# Patient Record
Sex: Female | Born: 1945 | Race: Black or African American | Hispanic: No | Smoking: Current some day smoker
Health system: Southern US, Community
[De-identification: ages and names within clinical notes are randomized; demographics above are authoritative.]

## PROBLEM LIST (undated history)

## (undated) DIAGNOSIS — F32A Depression, unspecified: Secondary | ICD-10-CM

## (undated) DIAGNOSIS — J449 Chronic obstructive pulmonary disease, unspecified: Secondary | ICD-10-CM

## (undated) DIAGNOSIS — J45909 Unspecified asthma, uncomplicated: Secondary | ICD-10-CM

## (undated) DIAGNOSIS — F329 Major depressive disorder, single episode, unspecified: Secondary | ICD-10-CM

## (undated) DIAGNOSIS — I10 Essential (primary) hypertension: Secondary | ICD-10-CM

## (undated) DIAGNOSIS — I509 Heart failure, unspecified: Secondary | ICD-10-CM

## (undated) DIAGNOSIS — F419 Anxiety disorder, unspecified: Secondary | ICD-10-CM

## (undated) DIAGNOSIS — K219 Gastro-esophageal reflux disease without esophagitis: Secondary | ICD-10-CM

## (undated) HISTORY — PX: TUBAL LIGATION: SHX77

## (undated) HISTORY — DX: Heart failure, unspecified: I50.9

## (undated) HISTORY — PX: ABDOMINAL HYSTERECTOMY: SHX81

## (undated) HISTORY — PX: TOTAL HIP ARTHROPLASTY: SHX124

---

## 2016-03-24 ENCOUNTER — Encounter: Payer: Self-pay | Admitting: Emergency Medicine

## 2016-03-24 ENCOUNTER — Observation Stay
Admission: EM | Admit: 2016-03-24 | Discharge: 2016-03-26 | Disposition: A | Discharge status: Home / Self Care | Payer: Medicare Other | Attending: Internal Medicine | Admitting: Internal Medicine

## 2016-03-24 DIAGNOSIS — Z9109 Other allergy status, other than to drugs and biological substances: Secondary | ICD-10-CM | POA: Insufficient documentation

## 2016-03-24 DIAGNOSIS — Z90710 Acquired absence of both cervix and uterus: Secondary | ICD-10-CM | POA: Diagnosis not present

## 2016-03-24 DIAGNOSIS — E213 Hyperparathyroidism, unspecified: Secondary | ICD-10-CM | POA: Insufficient documentation

## 2016-03-24 DIAGNOSIS — J449 Chronic obstructive pulmonary disease, unspecified: Secondary | ICD-10-CM | POA: Diagnosis not present

## 2016-03-24 DIAGNOSIS — R748 Abnormal levels of other serum enzymes: Secondary | ICD-10-CM | POA: Diagnosis present

## 2016-03-24 DIAGNOSIS — Z885 Allergy status to narcotic agent status: Secondary | ICD-10-CM | POA: Insufficient documentation

## 2016-03-24 DIAGNOSIS — E041 Nontoxic single thyroid nodule: Secondary | ICD-10-CM | POA: Diagnosis not present

## 2016-03-24 DIAGNOSIS — Z96642 Presence of left artificial hip joint: Secondary | ICD-10-CM | POA: Diagnosis not present

## 2016-03-24 DIAGNOSIS — Z886 Allergy status to analgesic agent status: Secondary | ICD-10-CM | POA: Diagnosis not present

## 2016-03-24 DIAGNOSIS — Z8249 Family history of ischemic heart disease and other diseases of the circulatory system: Secondary | ICD-10-CM | POA: Insufficient documentation

## 2016-03-24 DIAGNOSIS — J209 Acute bronchitis, unspecified: Secondary | ICD-10-CM | POA: Insufficient documentation

## 2016-03-24 DIAGNOSIS — I214 Non-ST elevation (NSTEMI) myocardial infarction: Secondary | ICD-10-CM | POA: Diagnosis present

## 2016-03-24 DIAGNOSIS — R6884 Jaw pain: Secondary | ICD-10-CM | POA: Insufficient documentation

## 2016-03-24 DIAGNOSIS — I10 Essential (primary) hypertension: Secondary | ICD-10-CM | POA: Insufficient documentation

## 2016-03-24 DIAGNOSIS — Z7982 Long term (current) use of aspirin: Secondary | ICD-10-CM | POA: Diagnosis not present

## 2016-03-24 DIAGNOSIS — F419 Anxiety disorder, unspecified: Secondary | ICD-10-CM | POA: Insufficient documentation

## 2016-03-24 DIAGNOSIS — E279 Disorder of adrenal gland, unspecified: Secondary | ICD-10-CM | POA: Insufficient documentation

## 2016-03-24 DIAGNOSIS — R1310 Dysphagia, unspecified: Secondary | ICD-10-CM | POA: Diagnosis not present

## 2016-03-24 DIAGNOSIS — I700 Atherosclerosis of aorta: Secondary | ICD-10-CM | POA: Diagnosis not present

## 2016-03-24 DIAGNOSIS — R079 Chest pain, unspecified: Secondary | ICD-10-CM | POA: Diagnosis not present

## 2016-03-24 DIAGNOSIS — R778 Other specified abnormalities of plasma proteins: Secondary | ICD-10-CM

## 2016-03-24 DIAGNOSIS — Z79899 Other long term (current) drug therapy: Secondary | ICD-10-CM | POA: Insufficient documentation

## 2016-03-24 DIAGNOSIS — K219 Gastro-esophageal reflux disease without esophagitis: Secondary | ICD-10-CM | POA: Diagnosis not present

## 2016-03-24 DIAGNOSIS — F329 Major depressive disorder, single episode, unspecified: Secondary | ICD-10-CM | POA: Insufficient documentation

## 2016-03-24 DIAGNOSIS — F17200 Nicotine dependence, unspecified, uncomplicated: Secondary | ICD-10-CM | POA: Diagnosis not present

## 2016-03-24 DIAGNOSIS — R7989 Other specified abnormal findings of blood chemistry: Secondary | ICD-10-CM

## 2016-03-24 HISTORY — DX: Depression, unspecified: F32.A

## 2016-03-24 HISTORY — DX: Essential (primary) hypertension: I10

## 2016-03-24 HISTORY — DX: Gastro-esophageal reflux disease without esophagitis: K21.9

## 2016-03-24 HISTORY — DX: Major depressive disorder, single episode, unspecified: F32.9

## 2016-03-24 HISTORY — DX: Unspecified asthma, uncomplicated: J45.909

## 2016-03-24 HISTORY — DX: Chronic obstructive pulmonary disease, unspecified: J44.9

## 2016-03-24 HISTORY — DX: Anxiety disorder, unspecified: F41.9

## 2016-03-24 LAB — COMPREHENSIVE METABOLIC PANEL
ALT: 15 U/L (ref 14–54)
AST: 19 U/L (ref 15–41)
Albumin: 4.3 g/dL (ref 3.5–5.0)
Alkaline Phosphatase: 72 U/L (ref 38–126)
Anion gap: 7 (ref 5–15)
BUN: 15 mg/dL (ref 6–20)
CO2: 24 mmol/L (ref 22–32)
Calcium: 9.6 mg/dL (ref 8.9–10.3)
Chloride: 109 mmol/L (ref 101–111)
Creatinine, Ser: 0.84 mg/dL (ref 0.44–1.00)
GFR calc Af Amer: >60 mL/min (ref >60)
GFR calc non Af Amer: >60 mL/min (ref >60)
Glucose, Bld: 91 mg/dL (ref 65–99)
Potassium: 4.3 mmol/L (ref 3.5–5.1)
Sodium: 140 mmol/L (ref 135–145)
Total Bilirubin: 0.8 mg/dL (ref 0.3–1.2)
Total Protein: 7.6 g/dL (ref 6.5–8.1)

## 2016-03-24 LAB — CBC
HCT: 37.4 % (ref 35.0–47.0)
Hemoglobin: 12.7 g/dL (ref 12.0–16.0)
MCH: 31.7 pg (ref 26.0–34.0)
MCHC: 34.1 g/dL (ref 32.0–36.0)
MCV: 93 fL (ref 80.0–100.0)
Platelets: 539 10*3/uL — ABNORMAL HIGH (ref 150–440)
RBC: 4.02 MIL/uL (ref 3.80–5.20)
RDW: 15.3 % — ABNORMAL HIGH (ref 11.5–14.5)
WBC: 8.6 10*3/uL (ref 3.6–11.0)

## 2016-03-24 LAB — TROPONIN I: Troponin I: 0.06 ng/mL (ref <0.03)

## 2016-03-24 NOTE — ED Notes (Signed)
Charge nurse notified troponin 0.06; pt taken to room 10 by Almyra Free EDT and placed on card monitor for further eval

## 2016-03-24 NOTE — ED Triage Notes (Signed)
Patient reports pain to right side jaw and lymph node area of neck. Patient states she has been being followed by [facility] for something related to swelling and pain in same area for 2 years. States pain has become so severe in last couple days that she can't tolerate pain anymore. Patient unable to state what condition doctors think she may have, etc. States pain feels like a toothache, has dentures on upper and lower.

## 2016-03-25 ENCOUNTER — Encounter: Payer: Self-pay | Admitting: Radiology

## 2016-03-25 ENCOUNTER — Emergency Department: Payer: Medicare Other

## 2016-03-25 ENCOUNTER — Observation Stay
Admit: 2016-03-25 | Discharge: 2016-03-25 | Disposition: A | Discharge status: Home / Self Care | Payer: Medicare Other | Attending: Internal Medicine | Admitting: Internal Medicine

## 2016-03-25 DIAGNOSIS — R079 Chest pain, unspecified: Secondary | ICD-10-CM | POA: Diagnosis present

## 2016-03-25 LAB — LIPID PANEL
Cholesterol: 139 mg/dL (ref 0–200)
HDL: 46 mg/dL (ref >40)
LDL Cholesterol: 76 mg/dL (ref 0–99)
Total CHOL/HDL Ratio: 3 RATIO
Triglycerides: 87 mg/dL (ref <150)
VLDL: 17 mg/dL (ref 0–40)

## 2016-03-25 LAB — CBC
HCT: 39.3 % (ref 35.0–47.0)
Hemoglobin: 13.3 g/dL (ref 12.0–16.0)
MCH: 31.4 pg (ref 26.0–34.0)
MCHC: 33.8 g/dL (ref 32.0–36.0)
MCV: 92.9 fL (ref 80.0–100.0)
Platelets: 530 10*3/uL — ABNORMAL HIGH (ref 150–440)
RBC: 4.23 MIL/uL (ref 3.80–5.20)
RDW: 15.5 % — ABNORMAL HIGH (ref 11.5–14.5)
WBC: 7.4 10*3/uL (ref 3.6–11.0)

## 2016-03-25 LAB — BASIC METABOLIC PANEL
Anion gap: 9 (ref 5–15)
BUN: 15 mg/dL (ref 6–20)
CO2: 21 mmol/L — ABNORMAL LOW (ref 22–32)
Calcium: 9.2 mg/dL (ref 8.9–10.3)
Chloride: 110 mmol/L (ref 101–111)
Creatinine, Ser: 0.87 mg/dL (ref 0.44–1.00)
GFR calc Af Amer: >60 mL/min (ref >60)
GFR calc non Af Amer: >60 mL/min (ref >60)
Glucose, Bld: 82 mg/dL (ref 65–99)
Potassium: 4.3 mmol/L (ref 3.5–5.1)
Sodium: 140 mmol/L (ref 135–145)

## 2016-03-25 LAB — APTT: aPTT: 34 seconds (ref 24–36)

## 2016-03-25 LAB — TROPONIN I
Troponin I: 0.05 ng/mL (ref <0.03)
Troponin I: 0.04 ng/mL (ref <0.03)

## 2016-03-25 LAB — PROTIME-INR
INR: 0.95
Prothrombin Time: 12.7 seconds (ref 11.4–15.2)

## 2016-03-25 MED ORDER — ATORVASTATIN CALCIUM 20 MG PO TABS
20.0000 mg | ORAL_TABLET | Freq: Every day | ORAL | Status: DC
  Administered 2016-03-25: 20 mg via ORAL
  Filled 2016-03-25 (×2): qty 1

## 2016-03-25 MED ORDER — ESCITALOPRAM OXALATE 10 MG PO TABS
20.0000 mg | ORAL_TABLET | Freq: Every day | ORAL | Status: DC
  Administered 2016-03-25 – 2016-03-26 (×2): 20 mg via ORAL
  Filled 2016-03-25: qty 1
  Filled 2016-03-25 (×2): qty 2

## 2016-03-25 MED ORDER — LISINOPRIL 20 MG PO TABS
40.0000 mg | ORAL_TABLET | Freq: Every day | ORAL | Status: DC
  Administered 2016-03-25 – 2016-03-26 (×2): 40 mg via ORAL
  Filled 2016-03-25 (×2): qty 2

## 2016-03-25 MED ORDER — HEPARIN SODIUM (PORCINE) 5000 UNIT/ML IJ SOLN
4000.0000 [IU] | Freq: Once | INTRAMUSCULAR | Status: AC
  Administered 2016-03-25: 4000 [IU] via INTRAVENOUS
  Filled 2016-03-25: qty 1

## 2016-03-25 MED ORDER — HEPARIN (PORCINE) IN NACL 100-0.45 UNIT/ML-% IJ SOLN
800.0000 [IU]/h | INTRAMUSCULAR | Status: DC
  Administered 2016-03-25: 800 [IU]/h via INTRAVENOUS
  Filled 2016-03-25: qty 250

## 2016-03-25 MED ORDER — ACETAMINOPHEN 325 MG PO TABS
650.0000 mg | ORAL_TABLET | ORAL | Status: DC | PRN
  Administered 2016-03-25: 650 mg via ORAL
  Filled 2016-03-25: qty 2

## 2016-03-25 MED ORDER — SODIUM CHLORIDE 0.9 % IV SOLN: 250.0000 mL | INTRAVENOUS | Status: DC | PRN

## 2016-03-25 MED ORDER — ASPIRIN EC 81 MG PO TBEC
81.0000 mg | DELAYED_RELEASE_TABLET | Freq: Every day | ORAL | Status: DC
  Administered 2016-03-26: 81 mg via ORAL
  Filled 2016-03-25: qty 1

## 2016-03-25 MED ORDER — ASPIRIN 81 MG PO CHEW
324.0000 mg | CHEWABLE_TABLET | Freq: Once | ORAL | Status: AC
  Administered 2016-03-25: 324 mg via ORAL
  Filled 2016-03-25: qty 4

## 2016-03-25 MED ORDER — CARVEDILOL 6.25 MG PO TABS
3.1250 mg | ORAL_TABLET | Freq: Two times a day (BID) | ORAL | Status: DC
  Administered 2016-03-25 – 2016-03-26 (×3): 3.125 mg via ORAL
  Filled 2016-03-25 (×2): qty 1
  Filled 2016-03-25: qty 2

## 2016-03-25 MED ORDER — AMLODIPINE BESYLATE 10 MG PO TABS
10.0000 mg | ORAL_TABLET | Freq: Every day | ORAL | Status: DC
  Administered 2016-03-25 – 2016-03-26 (×2): 10 mg via ORAL
  Filled 2016-03-25 (×2): qty 1

## 2016-03-25 MED ORDER — IPRATROPIUM BROMIDE HFA 17 MCG/ACT IN AERS: 2.0000 | INHALATION_SPRAY | Freq: Four times a day (QID) | RESPIRATORY_TRACT | Status: DC

## 2016-03-25 MED ORDER — NITROGLYCERIN 0.4 MG SL SUBL: 0.4000 mg | SUBLINGUAL_TABLET | SUBLINGUAL | Status: DC | PRN

## 2016-03-25 MED ORDER — OXYCODONE-ACETAMINOPHEN 5-325 MG PO TABS
2.0000 | ORAL_TABLET | Freq: Once | ORAL | Status: AC
  Administered 2016-03-25: 2 via ORAL
  Filled 2016-03-25: qty 2

## 2016-03-25 MED ORDER — FAMOTIDINE 20 MG PO TABS
20.0000 mg | ORAL_TABLET | Freq: Two times a day (BID) | ORAL | Status: DC
  Administered 2016-03-25 – 2016-03-26 (×2): 20 mg via ORAL
  Filled 2016-03-25 (×2): qty 1

## 2016-03-25 MED ORDER — ONDANSETRON HCL 4 MG/2ML IJ SOLN: 4.0000 mg | Freq: Four times a day (QID) | INTRAMUSCULAR | Status: DC | PRN

## 2016-03-25 MED ORDER — SODIUM CHLORIDE 0.9% FLUSH
3.0000 mL | Freq: Two times a day (BID) | INTRAVENOUS | Status: DC
  Administered 2016-03-25 – 2016-03-26 (×3): 3 mL via INTRAVENOUS

## 2016-03-25 MED ORDER — MOMETASONE FURO-FORMOTEROL FUM 200-5 MCG/ACT IN AERO
2.0000 | INHALATION_SPRAY | Freq: Two times a day (BID) | RESPIRATORY_TRACT | Status: DC
  Administered 2016-03-25 – 2016-03-26 (×2): 2 via RESPIRATORY_TRACT
  Filled 2016-03-25: qty 8.8

## 2016-03-25 MED ORDER — CLOPIDOGREL BISULFATE 75 MG PO TABS
75.0000 mg | ORAL_TABLET | Freq: Every day | ORAL | Status: DC
  Administered 2016-03-25: 75 mg via ORAL
  Filled 2016-03-25: qty 1

## 2016-03-25 MED ORDER — IPRATROPIUM BROMIDE 0.02 % IN SOLN
0.5000 mg | Freq: Four times a day (QID) | RESPIRATORY_TRACT | Status: DC
  Administered 2016-03-25: 0.5 mg via RESPIRATORY_TRACT
  Filled 2016-03-25: qty 2.5

## 2016-03-25 MED ORDER — PRAVASTATIN SODIUM 10 MG PO TABS: 10.0000 mg | ORAL_TABLET | Freq: Every day | ORAL | Status: DC

## 2016-03-25 MED ORDER — SODIUM CHLORIDE 0.9% FLUSH: 3.0000 mL | INTRAVENOUS | Status: DC | PRN

## 2016-03-25 MED ORDER — IOPAMIDOL (ISOVUE-300) INJECTION 61%
75.0000 mL | Freq: Once | INTRAVENOUS | Status: AC | PRN
  Administered 2016-03-25: 75 mL via INTRAVENOUS

## 2016-03-25 MED ORDER — IPRATROPIUM BROMIDE 0.02 % IN SOLN
0.5000 mg | Freq: Four times a day (QID) | RESPIRATORY_TRACT | Status: DC
  Administered 2016-03-26 (×2): 0.5 mg via RESPIRATORY_TRACT
  Filled 2016-03-25 (×2): qty 2.5

## 2016-03-25 NOTE — ED Notes (Signed)
Per Dr Darvin Neighbours, pt will stay the night and have stress test in AM.

## 2016-03-25 NOTE — Progress Notes (Signed)
ANTICOAGULATION CONSULT NOTE - Initial Consult  Pharmacy Consult for heparin drip Indication: chest pain/ACS/NSTEMI  Allergies  Allergen Reactions  . Aspirin Other (See Comments)    GI ulcers  . Morphine And Related Other (See Comments)    Delerium, itching  . Nsaids Other (See Comments)    GI ulcers    Patient Measurements: Height: 5\' 3"  (160 cm) Weight: 155 lb (70.3 kg) IBW/kg (Calculated) : 52.4 Heparin Dosing Weight: 67kg  Vital Signs: Temp: 98.3 F (36.8 C) (01/09 1934) Temp Source: Oral (01/09 1934) BP: 144/80 (01/10 0433) Pulse Rate: 66 (01/10 0433)  Labs:  Recent Labs  03/24/16 1931 03/25/16 0037  HGB 12.7  --   HCT 37.4  --   PLT 539*  --   APTT  --  34  LABPROT  --  12.7  INR  --  0.95  CREATININE 0.84  --   TROPONINI 0.06*  --     Estimated Creatinine Clearance: 57.8 mL/min (by C-G formula based on SCr of 0.84 mg/dL).   Medical History: Past Medical History:  Diagnosis Date  . Anxiety   . Asthma   . COPD (chronic obstructive pulmonary disease) ([place])   . Depression   . GERD (gastroesophageal reflux disease)   . Hypertension     Medications:  No anticoagulation in PTA meds  Assessment:  Goal of Therapy:  Heparin level 0.3-0.7 units/ml Monitor platelets by anticoagulation protocol: Yes   Plan:  4000 unit bolus and initial rate of 800 units/hr. First heparin level 8 hours after start of infusion.  Yida Hyams S 03/25/2016,4:40 AM

## 2016-03-25 NOTE — H&P (Signed)
[facility] [place] NAME: Pamela James    MR#:  [id number]  DATE OF BIRTH:  19-Dec-1945  DATE OF ADMISSION:  03/24/2016  PRIMARY CARE PHYSICIAN: Cindra Eves, MD   REQUESTING/REFERRING PHYSICIAN:   CHIEF COMPLAINT:   Chief Complaint  Patient presents with  . Jaw Pain    HISTORY OF PRESENT ILLNESS: Pamela James  is a 71 y.o. female with a known history of Anxiety disorder, COPD, GERD, hypertension presented to the emergency room with chest pain and right-sided jaw pain for the last few days. The pain is located in the left side of the chest and is aching and pressure like sensation 4 out of 10 on a scale of 1-10. Patient also has some pain in the right jaw. Patient was evaluated with CT of the neck which showed thyroid nodule. Her first set of troponin was borderline on 0.06. No history of any shortness of breath, orthopnea or proximal nocturnal dyspnea. No headache dizziness or blurry vision. EKG normal sinus rhythm with no ST segment changes. Hospitalist service was consulted for further care of the patient.  PAST MEDICAL HISTORY:   Past Medical History:  Diagnosis Date  . Anxiety   . Asthma   . COPD (chronic obstructive pulmonary disease) ([place])   . Depression   . GERD (gastroesophageal reflux disease)   . Hypertension     PAST SURGICAL HISTORY: Past Surgical History:  Procedure Laterality Date  . ABDOMINAL HYSTERECTOMY    . TOTAL HIP ARTHROPLASTY Left   . TUBAL LIGATION      SOCIAL HISTORY:  Social History  Substance Use Topics  . Smoking status: Current Every Day Smoker  . Smokeless tobacco: Never Used  . Alcohol use No    FAMILY HISTORY:  Family History  Problem Relation Age of Onset  . Heart disease Father   . Heart disease Sister     DRUG ALLERGIES:  Allergies  Allergen Reactions  . Aspirin Other (See Comments)    GI ulcers  . Morphine And Related Other (See Comments)    Delerium, itching  . Nsaids  Other (See Comments)    GI ulcers    REVIEW OF SYSTEMS:   CONSTITUTIONAL: No fever, fatigue or weakness.  EYES: No blurred or double vision.  EARS, NOSE, AND THROAT: No tinnitus or ear pain.  RESPIRATORY: No cough, shortness of breath, wheezing or hemoptysis.  CARDIOVASCULAR: Has chest pain, no orthopnea, edema.  GASTROINTESTINAL: No nausea, vomiting, diarrhea or abdominal pain.  GENITOURINARY: No dysuria, hematuria.  ENDOCRINE: No polyuria, nocturia,  HEMATOLOGY: No anemia, easy bruising or bleeding SKIN: No rash or lesion. MUSCULOSKELETAL: No joint pain or arthritis.   NEUROLOGIC: No tingling, numbness, weakness.  PSYCHIATRY: No anxiety or depression.   MEDICATIONS AT HOME:  Prior to Admission medications   Medication Sig Start Date End Date Taking? Authorizing Provider  acetaminophen (TYLENOL) 500 MG tablet Take 500 mg by mouth every 6 (six) hours as needed.   Yes Historical Provider, MD  albuterol (PROVENTIL HFA;VENTOLIN HFA) 108 (90 Base) MCG/ACT inhaler Inhale 2 puffs into the lungs every 6 (six) hours as needed for wheezing or shortness of breath.   Yes Historical Provider, MD  amLODipine (NORVASC) 10 MG tablet Take 10 mg by mouth daily.   Yes Historical Provider, MD  escitalopram (LEXAPRO) 20 MG tablet Take 20 mg by mouth daily.   Yes Historical Provider, MD  Fluticasone-Salmeterol (ADVAIR) 250-50 MCG/DOSE AEPB Inhale 1 puff into the  lungs 2 (two) times daily.   Yes Historical Provider, MD  ipratropium (ATROVENT HFA) 17 MCG/ACT inhaler Inhale 2 puffs into the lungs every 6 (six) hours.   Yes Historical Provider, MD  lisinopril (PRINIVIL,ZESTRIL) 40 MG tablet Take 40 mg by mouth daily.   Yes Historical Provider, MD  pravastatin (PRAVACHOL) 20 MG tablet Take 10 mg by mouth daily.   Yes Historical Provider, MD  ranitidine (ZANTAC) 150 MG capsule Take 150 mg by mouth 2 (two) times daily.   Yes Historical Provider, MD      PHYSICAL EXAMINATION:   VITAL SIGNS: Blood pressure  (!) 169/59, pulse (!) 56, temperature 98.3 F (36.8 C), temperature source Oral, resp. rate 20, height 5\' 3"  (1.6 m), weight 70.3 kg (155 lb), SpO2 96 %.  GENERAL:  71 y.o.-year-old patient lying in the bed with no acute distress.  EYES: Pupils equal, round, reactive to light and accommodation. No scleral icterus. Extraocular muscles intact.  HEENT: Head atraumatic, normocephalic. Oropharynx and nasopharynx clear.  NECK:  Supple, no jugular venous distention. No thyroid enlargement, no tenderness.  LUNGS: Normal breath sounds bilaterally, no wheezing, rales,rhonchi or crepitation. No use of accessory muscles of respiration.  CARDIOVASCULAR: S1, S2 normal. No murmurs, rubs, or gallops.  ABDOMEN: Soft, nontender, nondistended. Bowel sounds present. No organomegaly or mass.  EXTREMITIES: No pedal edema, cyanosis, or clubbing.  NEUROLOGIC: Cranial nerves II through XII are intact. Muscle strength 5/5 in all extremities. Sensation intact. Gait not checked.  PSYCHIATRIC: The patient is alert and oriented x 3.  SKIN: No obvious rash, lesion, or ulcer.   LABORATORY PANEL:   CBC  Recent Labs Lab 03/24/16 1931  WBC 8.6  HGB 12.7  HCT 37.4  PLT 539*  MCV 93.0  MCH 31.7  MCHC 34.1  RDW 15.3*   ------------------------------------------------------------------------------------------------------------------  Chemistries   Recent Labs Lab 03/24/16 1931  NA 140  K 4.3  CL 109  CO2 24  GLUCOSE 91  BUN 15  CREATININE 0.84  CALCIUM 9.6  AST 19  ALT 15  ALKPHOS 72  BILITOT 0.8   ------------------------------------------------------------------------------------------------------------------ estimated creatinine clearance is 57.8 mL/min (by C-G formula based on SCr of 0.84 mg/dL). ------------------------------------------------------------------------------------------------------------------ No results for input(s): TSH, T4TOTAL, T3FREE, THYROIDAB in the last 72 hours.  Invalid  input(s): FREET3   Coagulation profile  Recent Labs Lab 03/25/16 0037  INR 0.95   ------------------------------------------------------------------------------------------------------------------- No results for input(s): DDIMER in the last 72 hours. -------------------------------------------------------------------------------------------------------------------  Cardiac Enzymes  Recent Labs Lab 03/24/16 1931  TROPONINI 0.06*   ------------------------------------------------------------------------------------------------------------------ Invalid input(s): POCBNP  ---------------------------------------------------------------------------------------------------------------  Urinalysis No results found for: COLORURINE, APPEARANCEUR, LABSPEC, PHURINE, GLUCOSEU, HGBUR, BILIRUBINUR, KETONESUR, PROTEINUR, UROBILINOGEN, NITRITE, LEUKOCYTESUR   RADIOLOGY: Ct Soft Tissue Neck W Contrast  Result Date: 03/25/2016 CLINICAL DATA:  Right-sided jaw pain. EXAM: CT NECK WITH CONTRAST TECHNIQUE: Multidetector CT imaging of the neck was performed using the standard protocol following the bolus administration of intravenous contrast. CONTRAST:  83mL ISOVUE-300 IOPAMIDOL (ISOVUE-300) INJECTION 61% COMPARISON:  None. FINDINGS: Pharynx and larynx: The nasopharynx is clear. The oropharynx and hypopharynx are normal. The epiglottis, supraglottic larynx, glottis and subglottic larynx are normal. No retropharyngeal collection. The parapharyngeal spaces are preserved. The patient is edentulous. The visible oral cavity and base of tongue are normal. 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 Salivary glands: The parotid and submandibular glands are normal. No sialolithiasis or salivary ductal dilatation. Thyroid: Large, heterogeneous nodule within the right thyroid lobe measures 2.0 cm. Lymph nodes: No enlarged or abnormal  density cervical lymph nodes. Vascular: The aortic atherosclerosis. Major  cervical vessels are normal. Limited intracranial: Normal Visualized orbits: Normal Mastoids and visualized paranasal sinuses: Clear Skeleton: There is no bony spinal canal stenosis. No lytic or blastic lesions. Cervical degenerative disc disease is greatest at C5-6 and C6-7. Upper chest: Negative. Other: None. IMPRESSION: 1. No lymphadenopathy or other neck mass. 2. No finding to explain the reported right-sided jaw pain. 3. 2 cm right thyroid nodule. Further characterization with thyroid ultrasound is recommended on a nonemergent outpatient basis, if not already performed. 4. Aortic atherosclerosis. Electronically Signed   By: Ulyses Jarred M.D.   On: 03/25/2016 01:24    EKG: Orders placed or performed during the hospital encounter of 03/24/16  . ED EKG  . ED EKG  . EKG 12-Lead  . EKG 12-Lead    IMPRESSION AND PLAN: 71 year old female patient with history of anxiety disorder, COPD, GERD and hypertension presented to the emergency room with chest pain and right jaw pain. Admitting diagnosis 1. Chest pain 2. Thyroid nodule 3. COPD 4. GERD 5. Hypertension 6. Borderline troponin Treatment plan Admit patient to telemetry Start patient on IV heparin drip Cycle troponin and check echocardiogram Cardiology consultation When necessary nitrates for chest pain Outpatient workup with ultrasound for thyroid nodule Patient will be started on oral Plavix as patient is allergic to aspirin Supportive care.  All the records are reviewed and case discussed with ED provider. Management plans discussed with the patient, family and they are in agreement.  CODE STATUS:FULL CODE    Code Status Orders        Start     Ordered   03/25/16 0319  Full code  Continuous     03/25/16 0318    Code Status History    Date Active Date Inactive Code Status Order ID Comments User Context   This patient has a current code status but no historical code status.    Advance Directive Documentation    [place] Most Recent Value  Type of Advance Directive  Living will  Pre-existing out of facility DNR order (yellow form or pink MOST form)  No data  "MOST" Form in Place?  No data       TOTAL TIME TAKING CARE OF THIS PATIENT: 51 minutes.    Saundra Shelling M.D on 03/25/2016 at 3:28 AM  Between 7am to 6pm - Pager - [phone]  After 6pm go to [email or web address] - password [email or web address] [facility]  Office  [phone]  CC: Primary care physician; Cindra Eves, MD

## 2016-03-25 NOTE — ED Notes (Signed)
Pt up to bathroom in hallway.

## 2016-03-25 NOTE — ED Provider Notes (Signed)
[facility] Emergency Department Provider Note  ____________________________________________   First MD Initiated Contact with Patient 03/24/16 2359     (approximate)  I have reviewed the triage vital signs and the nursing notes.   HISTORY  Chief Complaint Jaw Pain    HPI Pamela James is a 71 y.o. female with medical history as listed below who presents for evaluation of right-sided jaw pain and right upper neck pain.  She reports that this is better problem for her for years and she is followed by [facility] primary care but it has become severe over the last couple of days.  She states that she is having some difficulty swallowing as well although she does not have any changes in her voice or ability to speak.  She denies fever/chills, nausea, vomiting, abdominal pain.  She does report that she has been having episodic chest pain and some shortness of breath with exertion over the last week which is new compared to prior, and she does have multiple first-degree relatives that have had heart attacks.He is a in every day smoker and also has a history of COPD and hypertension.  She reports that over the couple of years that she has been followed for this pain in the right side of her jaw and neck, she was told that she has some sort of a mass that would be a very delicate operation to remove.  They were just following it but now it is much worse with severe pain and that is why she has come to the emergency department tonight for further evaluation, particularly because it feels to her like she is having difficulty swallowing.  She is completely edentulous so she has no toothache or radiation of pain from inside her mouth.  He describes the pain as severe, sharp, "full", nothing makes it better or worse.   Past Medical History:  Diagnosis Date  . Anxiety   . Asthma   . COPD (chronic obstructive pulmonary disease) ([place])   . Depression   . GERD (gastroesophageal reflux  disease)   . Hypertension     There are no active problems to display for this patient.   Past Surgical History:  Procedure Laterality Date  . ABDOMINAL HYSTERECTOMY    . TOTAL HIP ARTHROPLASTY Left   . TUBAL LIGATION      Prior to Admission medications   Not on File    Allergies Aspirin; Morphine and related; and Nsaids  No family history on file.  Social History Social History  Substance Use Topics  . Smoking status: Current Every Day Smoker  . Smokeless tobacco: Never Used  . Alcohol use No    Review of Systems Constitutional: No fever/chills Eyes: No visual changes. ENT: No sore throat. Mild difficulty swallowing and pain in the right side of her jaw and right upper neck Cardiovascular: Episodic chest pain over the last week, left sided, sharp Respiratory: Episodic Shortness of breath worse with exertion and episodic chest pain  Gastrointestinal: No abdominal pain.  No nausea, no vomiting.  No diarrhea.  No constipation. Genitourinary: Negative for dysuria. Musculoskeletal: Negative for back pain. Skin: Negative for rash. Neurological: Negative for headaches, focal weakness or numbness.  10-point ROS otherwise negative.  ____________________________________________   PHYSICAL EXAM:  VITAL SIGNS: ED Triage Vitals [03/24/16 1934]  Enc Vitals Group     BP (!) 165/76     Pulse Rate 72     Resp 16     Temp 98.3 F (36.8  C)     Temp Source Oral     SpO2 97 %     Weight 155 lb (70.3 kg)     Height 5\' 3"  (1.6 m)     Head Circumference      Peak Flow      Pain Score 8     Pain Loc      Pain Edu?      Excl. in [place]?     Constitutional: Alert and oriented. Well appearing and in no acute distress. Eyes: Conjunctivae are normal. PERRL. EOMI. Head: Atraumatic. Nose: No congestion/rhinnorhea. Mouth/Throat: Mucous membranes are moist.  Oropharynx non-erythematous. Edentulous with no evidence of dental/intraoral abscess Neck: No stridor.  No meningeal  signs.  Some right-sided submandibular lymphadenopathy that is tender to palpation.  No gross deformities. Cardiovascular: Normal rate, regular rhythm. Good peripheral circulation. Grossly normal heart sounds. Respiratory: Normal respiratory effort.  No retractions. Lungs CTAB. Gastrointestinal: Soft and nontender. No distention.  Musculoskeletal: No lower extremity tenderness nor edema. No gross deformities of extremities. Neurologic:  Normal speech and language. No gross focal neurologic deficits are appreciated.  Skin:  Skin is warm, dry and intact. No rash noted. Psychiatric: Mood and affect are normal. Speech and behavior are normal.  ____________________________________________   LABS (all labs ordered are listed, but only abnormal results are displayed)  Labs Reviewed  CBC - Abnormal; Notable for the following:       Result Value   RDW 15.3 (*)    Platelets 539 (*)    All other components within normal limits  TROPONIN I - Abnormal; Notable for the following:    Troponin I 0.06 (*)    All other components within normal limits  COMPREHENSIVE METABOLIC PANEL  PROTIME-INR   ____________________________________________  EKG  ED ECG REPORT I, Taziah Difatta, the attending physician, personally viewed and interpreted this ECG.  Date: 03/24/2016 EKG Time: 19:41 Rate: 68 Rhythm: normal sinus rhythm QRS Axis: normal Intervals: normal ST/T Wave abnormalities: normal Conduction Disturbances: none Narrative Interpretation: unremarkable  ____________________________________________  RADIOLOGY   Ct Soft Tissue Neck W Contrast  Result Date: 03/25/2016 CLINICAL DATA:  Right-sided jaw pain. EXAM: CT NECK WITH CONTRAST TECHNIQUE: Multidetector CT imaging of the neck was performed using the standard protocol following the bolus administration of intravenous contrast. CONTRAST:  58mL ISOVUE-300 IOPAMIDOL (ISOVUE-300) INJECTION 61% COMPARISON:  None. FINDINGS: Pharynx and larynx:  The nasopharynx is clear. The oropharynx and hypopharynx are normal. The epiglottis, supraglottic larynx, glottis and subglottic larynx are normal. No retropharyngeal collection. The parapharyngeal spaces are preserved. The patient is edentulous. The visible oral cavity and base of tongue are normal. 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 Salivary glands: The parotid and submandibular glands are normal. No sialolithiasis or salivary ductal dilatation. Thyroid: Large, heterogeneous nodule within the right thyroid lobe measures 2.0 cm. Lymph nodes: No enlarged or abnormal density cervical lymph nodes. Vascular: The aortic atherosclerosis. Major cervical vessels are normal. Limited intracranial: Normal Visualized orbits: Normal Mastoids and visualized paranasal sinuses: Clear Skeleton: There is no bony spinal canal stenosis. No lytic or blastic lesions. Cervical degenerative disc disease is greatest at C5-6 and C6-7. Upper chest: Negative. Other: None. IMPRESSION: 1. No lymphadenopathy or other neck mass. 2. No finding to explain the reported right-sided jaw pain. 3. 2 cm right thyroid nodule. Further characterization with thyroid ultrasound is recommended on a nonemergent outpatient basis, if not already performed. 4. Aortic atherosclerosis. Electronically Signed   By: Cletus Gash.D.  On: 03/25/2016 01:24    ____________________________________________   PROCEDURES  Procedure(s) performed:   .Critical Care Performed by: Hinda Kehr Authorized by: Hinda Kehr   Critical care provider statement:    Critical care time (minutes):  30   Critical care time was exclusive of:  Separately billable procedures and treating other patients   Critical care was necessary to treat or prevent imminent or life-threatening deterioration of the following conditions:  Cardiac failure   Critical care was time spent personally by me on the following activities:  Development of treatment plan with patient  or surrogate, discussions with consultants, evaluation of patient's response to treatment, examination of patient, obtaining history from patient or surrogate, ordering and performing treatments and interventions, ordering and review of laboratory studies, ordering and review of radiographic studies, pulse oximetry, re-evaluation of patient's condition and review of old charts      Critical Care performed: Yes, see critical care procedure note(s) ____________________________________________   INITIAL IMPRESSION / [facility] / ED COURSE  Pertinent labs & imaging results that were available during my care of the patient were reviewed by me and considered in my medical decision making (see chart for details).  The patient appears to be having at least 2 distinct processes going on.  I was unable to access any of her electronic medical records at [facility] for some reason, so I cannot confirm or obtain more information about the prior neck mass diagnosis.  I will obtain a CT scan with contrast of her neck for further evaluation to determine if she has an infection or a mass/neoplasm that may be worsening.  Additionally she reports episodic left-sided chest pain and has a troponin of 0.06.  This is concerning for NSTEMI.  She reports an allergy to aspirin but the allergy is GI ulcers.  I will discuss this with the patient and family and encourage at least a first dose of aspirin and will likely start her on heparin, but I wanted to obtain the CT scan of her neck first since we do not know what is going on and whether she is having a surgical emergency of her neck such as a deep abscess or potentially dangerous mass.  Because most of her care has been at [facility] I asked if her preference is to be at [facility], but she and her daughter strongly prefer that she stays at [facility].   Clinical Course as of Mar 25 206  Wed Mar 25, 2016  0207 CT scan of the neck is unremarkable with  no indication or expiration for why should be having right jaw pain.  This brings me back to her episodic chest pain and elevated troponin.  I believe that this is anginal pain consistent with her troponin of 0.06.  I will start heparin with bolus and infusion and admit her for unstable angina versus NSTEMI.  I discussed it with the patient and her daughter as well as with the hospitalist.  [CF]    Clinical Course User Index [CF] Hinda Kehr, MD    ____________________________________________  FINAL CLINICAL IMPRESSION(S) / ED DIAGNOSES  Final diagnoses:  Elevated troponin I level  NSTEMI (non-ST elevated myocardial infarction) ([place])  Jaw pain     MEDICATIONS GIVEN DURING THIS VISIT:  Medications  iopamidol (ISOVUE-300) 61 % injection 75 mL (75 mLs Intravenous Contrast Given 03/25/16 0054)  oxyCODONE-acetaminophen (PERCOCET/ROXICET) 5-325 MG per tablet 2 tablet (2 tablets Oral Given 03/25/16 0105)  aspirin chewable tablet 324 mg (324  mg Oral Given 03/25/16 0150)  heparin injection 4,000 Units (4,000 Units Intravenous Given 03/25/16 0150)     NEW OUTPATIENT MEDICATIONS STARTED DURING THIS VISIT:  New Prescriptions   No medications on file    Modified Medications   No medications on file    Discontinued Medications   No medications on file     Note:  This document was prepared using Dragon voice recognition software and may include unintentional dictation errors.    Hinda Kehr, MD 03/25/16 [phone]

## 2016-03-25 NOTE — Consult Note (Signed)
[place]  CARDIOLOGY CONSULT NOTE  Patient ID: Pamela James MRN: [id number] DOB/AGE: 21-Dec-1945 71 y.o.  Admit date: 03/24/2016 Referring Physician Dr. Darvin Neighbours Primary Physician [facility] Primary Cardiologist none Reason for Consultation chest pain  HPI: 71 yo female with history of adrenal mass who presesnted to the er with chest discomfort, fluttering and right jaw pain. She has a trivial troponin elevation that does not appear to represent acs. Pain has resolved. EKG is normal.   ROS  Past Medical History:  Diagnosis Date  . Anxiety   . Asthma   . COPD (chronic obstructive pulmonary disease) ([place])   . Depression   . GERD (gastroesophageal reflux disease)   . Hypertension     Family History  Problem Relation Age of Onset  . Heart disease Father   . Heart disease Sister     Social History   Social History  . Marital status: Single    Spouse name: N/A  . Number of children: N/A  . Years of education: N/A   Occupational History  . retired    Social History Main Topics  . Smoking status: Current Every Day Smoker  . Smokeless tobacco: Never Used  . Alcohol use No  . Drug use: No  . Sexual activity: Not on file   Other Topics Concern  . Not on file   Social History Narrative  . No narrative on file    Past Surgical History:  Procedure Laterality Date  . ABDOMINAL HYSTERECTOMY    . TOTAL HIP ARTHROPLASTY Left   . TUBAL LIGATION        (Not in a hospital admission)  Physical Exam: Blood pressure (!) 158/73, pulse (!) 45, temperature 98.3 F (36.8 C), temperature source Oral, resp. rate 16, height 5\' 3"  (1.6 m), weight 155 lb (70.3 kg), SpO2 100 %.   Wt Readings from Last 1 Encounters:  03/24/16 155 lb (70.3 kg)     General appearance: alert and cooperative Neck: no adenopathy, no carotid bruit, no JVD, supple, symmetrical, trachea midline and thyroid not enlarged, symmetric, no tenderness/mass/nodules Cardio: regular  rate and rhythm GI: soft, non-tender; bowel sounds normal; no masses,  no organomegaly Extremities: extremities normal, atraumatic, no cyanosis or edema Neurologic: Grossly normal  Labs:   Lab Results  Component Value Date   WBC 8.6 03/24/2016   HGB 12.7 03/24/2016   HCT 37.4 03/24/2016   MCV 93.0 03/24/2016   PLT 539 (H) 03/24/2016    Recent Labs Lab 03/24/16 1931 03/25/16 0341  NA 140 140  K 4.3 4.3  CL 109 110  CO2 24 21*  BUN 15 15  CREATININE 0.84 0.87  CALCIUM 9.6 9.2  PROT 7.6  --   BILITOT 0.8  --   ALKPHOS 72  --   ALT 15  --   AST 19  --   GLUCOSE 91 82   Lab Results  Component Value Date   TROPONINI 0.04 ([place]) 03/25/2016      Radiology:  EKG: nsr with no ischemia  ASSESSMENT AND PLAN:  Pt is a 70 yo female with no prior cardiac history who presented to the er with  Complaints of chest fluttering and discomfort in her right jaw. Neck ct was unremarkable.  EKG showed nsr with no ischemia. She is currently pain free. She is followed at [facility] for hyperparathyroidism and adrenal mass. Her troponin is trivially elevated and does not appear to represent an acute coronary event. WIll proceed with  funcitonal study in am to guide further therapy. Will discontinue heparin and continue with asa for now.  Signed: Teodoro Spray MD, [facility] 03/25/2016, 2:54 PM

## 2016-03-25 NOTE — ED Notes (Signed)
Patient transported to CT 

## 2016-03-25 NOTE — ED Notes (Signed)
Pt given meal tray and sprite. 

## 2016-03-26 ENCOUNTER — Observation Stay: Payer: Medicare Other

## 2016-03-26 DIAGNOSIS — J209 Acute bronchitis, unspecified: Secondary | ICD-10-CM | POA: Diagnosis present

## 2016-03-26 DIAGNOSIS — R079 Chest pain, unspecified: Secondary | ICD-10-CM | POA: Diagnosis not present

## 2016-03-26 DIAGNOSIS — I10 Essential (primary) hypertension: Secondary | ICD-10-CM | POA: Diagnosis present

## 2016-03-26 LAB — ECHOCARDIOGRAM COMPLETE
Height: 63 in
Weight: 2561.6 oz

## 2016-03-26 MED ORDER — TECHNETIUM TC 99M TETROFOSMIN IV KIT
31.5900 | PACK | Freq: Once | INTRAVENOUS | Status: AC | PRN
  Administered 2016-03-26: 31.59 via INTRAVENOUS

## 2016-03-26 MED ORDER — PREDNISONE 50 MG PO TABS: 50.0000 mg | ORAL_TABLET | Freq: Every day | ORAL | 0 refills | Status: DC

## 2016-03-26 MED ORDER — PREDNISONE 50 MG PO TABS
50.0000 mg | ORAL_TABLET | Freq: Every day | ORAL | Status: DC
  Administered 2016-03-26: 50 mg via ORAL
  Filled 2016-03-26: qty 1

## 2016-03-26 MED ORDER — ASPIRIN 81 MG PO TBEC: 81.0000 mg | DELAYED_RELEASE_TABLET | Freq: Every day | ORAL | Status: DC

## 2016-03-26 MED ORDER — AZITHROMYCIN 500 MG PO TABS
500.0000 mg | ORAL_TABLET | Freq: Every day | ORAL | Status: DC
  Administered 2016-03-26: 500 mg via ORAL
  Filled 2016-03-26: qty 1

## 2016-03-26 MED ORDER — REGADENOSON 0.4 MG/5ML IV SOLN
0.4000 mg | Freq: Once | INTRAVENOUS | Status: AC
  Administered 2016-03-26: 0.4 mg via INTRAVENOUS

## 2016-03-26 MED ORDER — TECHNETIUM TC 99M TETROFOSMIN IV KIT
12.7910 | PACK | Freq: Once | INTRAVENOUS | Status: AC | PRN
  Administered 2016-03-26: 12.791 via INTRAVENOUS

## 2016-03-26 MED ORDER — AZITHROMYCIN 500 MG PO TABS: 500.0000 mg | ORAL_TABLET | Freq: Every day | ORAL | 0 refills | Status: DC

## 2016-03-26 MED ORDER — CARVEDILOL 3.125 MG PO TABS: 3.1250 mg | ORAL_TABLET | Freq: Two times a day (BID) | ORAL | 0 refills | Status: DC

## 2016-03-26 NOTE — Discharge Instructions (Signed)
Activity as before  Low salt diet

## 2016-03-26 NOTE — Progress Notes (Signed)
Stress sestimibi did not show any evidence of ischemia. OK for discharge from cardiac standpoint.

## 2016-03-26 NOTE — Progress Notes (Signed)
To nuclear medicine via bed 

## 2016-03-26 NOTE — Progress Notes (Signed)
Pt returned from echo, results pending. Complaint of neck pain from previous tests of thyroid and tylenol was requested. NPO for am stress test. Pt is currently asleep with alarm in place. Nursing will continue to assess.

## 2016-03-26 NOTE — Progress Notes (Signed)
Pt discharged to home via wc.  Instructions and rx given to pt.  Questions answered.  No distress.  

## 2016-03-26 NOTE — Care Management Obs Status (Signed)
[facility] NOTIFICATION   Patient Details  Name: Pamela James MRN: [id number] Date of Birth: 1945/12/31   Medicare Observation Status Notification Given:  Yes    Katrina Stack, RN 03/26/2016, 3:25 PM

## 2016-03-26 NOTE — Progress Notes (Signed)
[place] PRACT[facility]  SUBJECTIVE: no further chest pain   Vitals:   03/25/16 1609 03/25/16 1949 03/25/16 1955 03/26/16 0401  BP: (!) 170/73 (!) 144/70  (!) 178/56  Pulse: 62 (!) 52  (!) 52  Resp:  18  18  Temp:  98.3 F (36.8 C)  97.8 F (36.6 C)  TempSrc:  Oral  Oral  SpO2:  97% 98% 99%  Weight:      Height:        Intake/Output Summary (Last 24 hours) at 03/26/16 0842 Last data filed at 03/25/16 1941  Gross per 24 hour  Intake              240 ml  Output                0 ml  Net              240 ml    LABS: Basic Metabolic Panel:  Recent Labs  03/24/16 1931 03/25/16 0341  NA 140 140  K 4.3 4.3  CL 109 110  CO2 24 21*  GLUCOSE 91 82  BUN 15 15  CREATININE 0.84 0.87  CALCIUM 9.6 9.2   Liver Function Tests:  Recent Labs  03/24/16 1931  AST 19  ALT 15  ALKPHOS 72  BILITOT 0.8  PROT 7.6  ALBUMIN 4.3   No results for input(s): LIPASE, AMYLASE in the last 72 hours. CBC:  Recent Labs  03/24/16 1931 03/25/16 1520  WBC 8.6 7.4  HGB 12.7 13.3  HCT 37.4 39.3  MCV 93.0 92.9  PLT 539* 530*   Cardiac Enzymes:  Recent Labs  03/24/16 1931 03/25/16 0341 03/25/16 0926  TROPONINI 0.06* 0.05* 0.04*   BNP: Invalid input(s): POCBNP D-Dimer: No results for input(s): DDIMER in the last 72 hours. Hemoglobin A1C: No results for input(s): HGBA1C in the last 72 hours. Fasting Lipid Panel:  Recent Labs  03/25/16 0341  CHOL 139  HDL 46  LDLCALC 76  TRIG 87  CHOLHDL 3.0   Thyroid Function Tests: No results for input(s): TSH, T4TOTAL, T3FREE, THYROIDAB in the last 72 hours.  Invalid input(s): FREET3 Anemia Panel: No results for input(s): VITAMINB12, FOLATE, FERRITIN, TIBC, IRON, RETICCTPCT in the last 72 hours.   Physical Exam: Blood pressure (!) 178/56, pulse (!) 52, temperature 97.8 F (36.6 C), temperature source Oral, resp. rate 18, height 5\' 3"  (1.6 m), weight 160 lb 1.6 oz (72.6 kg), SpO2 99 %.   Wt Readings  from Last 1 Encounters:  03/25/16 160 lb 1.6 oz (72.6 kg)     General appearance: alert and cooperative Resp: clear to auscultation bilaterally Cardio: regular rate and rhythm Extremities: extremities normal, atraumatic, no cyanosis or edema Neurologic: Grossly normal  TELEMETRY: Reviewed telemetry pt in nsr  ASSESSMENT AND PLAN:  Active Problems:   Chest pain  No further chest pain. Echo showed normal ef. Ruled out for m I. Myoview today. If no ischemia, would discharge on current meds.   Teodoro Spray, MD, [facility] 03/26/2016 8:42 AM

## 2016-03-26 NOTE — Plan of Care (Signed)
Problem: Pain Managment: Goal: General experience of comfort will improve Outcome: Progressing No further chest pain  Problem: Activity: Goal: Risk for activity intolerance will decrease Outcome: Progressing Moving around room

## 2016-04-01 NOTE — Discharge Summary (Signed)
[place] [facility] [place] NAME: Pamela James    MR#:  [id number]  DATE OF BIRTH:  1945/06/07  DATE OF ADMISSION:  03/24/2016 ADMITTING PHYSICIAN: Saundra Shelling, MD  DATE OF DISCHARGE: 03/26/2016  5:08 PM  PRIMARY CARE PHYSICIAN: Cindra Eves, MD   ADMISSION DIAGNOSIS:  NSTEMI (non-ST elevated myocardial infarction) ([place]) [I21.4] Jaw pain [R68.84] Elevated troponin I level [R74.8] Chest pain [R07.9]  DISCHARGE DIAGNOSIS:  Active Problems:   Chest pain   Acute bronchitis   Essential hypertension   SECONDARY DIAGNOSIS:   Past Medical History:  Diagnosis Date  . Anxiety   . Asthma   . COPD (chronic obstructive pulmonary disease) ([place])   . Depression   . GERD (gastroesophageal reflux disease)   . Hypertension      ADMITTING HISTORY  71 year old female patient with history of anxiety disorder, COPD, GERD and hypertension presented to the emergency room with chest pain and right jaw pain. Admitting diagnosis 1. Chest pain 2. Thyroid nodule 3. COPD 4. GERD 5. Hypertension 6. Borderline troponin Treatment plan Admit patient to telemetry Start patient on IV heparin drip Cycle troponin and check echocardiogram Cardiology consultation When necessary nitrates for chest pain Outpatient workup with ultrasound for thyroid nodule Patient will be started on oral Plavix as patient is allergic to aspirin Supportive care.  HOSPITAL COURSE:   * Acute bronchitis Patient started on prednisone and azithromycin. Her chest pain was most likely due to acute bronchitis. But due to atypical nature she had a stress test which was normal. Discussed with Dr. Ubaldo Glassing of cardiology. Troponin normal. Patient had no further chest pain by the time of discharge.  * Sore throat and neck pain. This was associated with her acute bronchitis. Due to her history of neck masses CT scan of the neck was done which showed a small thyroid nodule which she was aware  of. Her doctors at [facility] have been following this yearly imaging.  Stable for discharge home to follow-up with her primary care physician.  CONSULTS OBTAINED:  Treatment Team:  Teodoro Spray, MD  DRUG ALLERGIES:   Allergies  Allergen Reactions  . Aspirin Other (See Comments)    GI ulcers  . Morphine And Related Other (See Comments)    Delerium, itching  . Nsaids Other (See Comments)    GI ulcers    DISCHARGE MEDICATIONS:   Discharge Medication List as of 03/26/2016  3:52 PM    START taking these medications   Details  aspirin EC 81 MG EC tablet Take 1 tablet (81 mg total) by mouth daily., Starting Fri 03/27/2016, No Print    azithromycin (ZITHROMAX) 500 MG tablet Take 1 tablet (500 mg total) by mouth daily., Starting Thu 03/26/2016, Normal    carvedilol (COREG) 3.125 MG tablet Take 1 tablet (3.125 mg total) by mouth 2 (two) times daily with a meal., Starting Thu 03/26/2016, Normal    predniSONE (DELTASONE) 50 MG tablet Take 1 tablet (50 mg total) by mouth daily with breakfast., Starting Fri 03/27/2016, Normal      CONTINUE these medications which have NOT CHANGED   Details  acetaminophen (TYLENOL) 500 MG tablet Take 500 mg by mouth every 6 (six) hours as needed., Historical Med    albuterol (PROVENTIL HFA;VENTOLIN HFA) 108 (90 Base) MCG/ACT inhaler Inhale 2 puffs into the lungs every 6 (six) hours as needed for wheezing or shortness of breath., Historical Med    amLODipine (NORVASC) 10 MG tablet Take 10 mg  by mouth daily., Historical Med    escitalopram (LEXAPRO) 20 MG tablet Take 20 mg by mouth daily., Historical Med    Fluticasone-Salmeterol (ADVAIR) 250-50 MCG/DOSE AEPB Inhale 1 puff into the lungs 2 (two) times daily., Historical Med    ipratropium (ATROVENT HFA) 17 MCG/ACT inhaler Inhale 2 puffs into the lungs every 6 (six) hours., Historical Med    lisinopril (PRINIVIL,ZESTRIL) 40 MG tablet Take 40 mg by mouth daily., Historical Med    pravastatin (PRAVACHOL) 20 MG  tablet Take 10 mg by mouth daily., Historical Med    ranitidine (ZANTAC) 150 MG capsule Take 150 mg by mouth 2 (two) times daily., Historical Med        Today   VITAL SIGNS:  Blood pressure (!) 151/76, pulse 65, temperature 97.8 F (36.6 C), temperature source Oral, resp. rate 20, height 5\' 3"  (1.6 m), weight 72.6 kg (160 lb 1.6 oz), SpO2 97 %.  I/O:  No intake or output data in the 24 hours ending 04/01/16 1605  PHYSICAL EXAMINATION:  Physical Exam  GENERAL:  71 y.o.-year-old patient lying in the bed with no acute distress.  LUNGS: Normal breath sounds bilaterally, no wheezing, rales,rhonchi or crepitation. No use of accessory muscles of respiration.  CARDIOVASCULAR: S1, S2 normal. No murmurs, rubs, or gallops.  ABDOMEN: Soft, non-tender, non-distended. Bowel sounds present. No organomegaly or mass.  NEUROLOGIC: Moves all 4 extremities. PSYCHIATRIC: The patient is alert and oriented x 3.  SKIN: No obvious rash, lesion, or ulcer.   DATA REVIEW:   CBC No results for input(s): WBC, HGB, HCT, PLT in the last 168 hours.  Chemistries  No results for input(s): NA, K, CL, CO2, GLUCOSE, BUN, CREATININE, CALCIUM, MG, AST, ALT, ALKPHOS, BILITOT in the last 168 hours.  Invalid input(s): GFRCGP  Cardiac Enzymes No results for input(s): TROPONINI in the last 168 hours.  Microbiology Results  No results found for this or any previous visit.  RADIOLOGY:  No results found.  Follow up with PCP in 1 week.  Management plans discussed with the patient, family and they are in agreement.  CODE STATUS:  Code Status History    Date Active Date Inactive Code Status Order ID Comments User Context   03/25/2016  3:18 AM 03/26/2016  8:14 PM Full Code [id number]  Saundra Shelling, MD ED    Advance Directive Documentation   [place] Most Recent Value  Type of Advance Directive  Living will  Pre-existing out of facility DNR order (yellow form or pink MOST form)  No data  "MOST" Form in  Place?  No data      TOTAL TIME TAKING CARE OF THIS PATIENT ON DAY OF DISCHARGE: more than 30 minutes.   Hillary Bow R M.D on 04/01/2016 at 4:06 PM  Between 7am to 6pm - Pager - [phone]  After 6pm go to [email or web address] - password [email or web address] [facility]  Office  [phone]  CC: Primary care physician; Cindra Eves, MD  Note: This dictation was prepared with Dragon dictation along with smaller phrase technology. Any transcriptional errors that result from this process are unintentional.

## 2016-04-16 LAB — NM MYOCAR MULTI W/SPECT W/WALL MOTION / EF
Estimated workload: 1 METS
Exercise duration (min): 1 min
Exercise duration (sec): 0 s
LV dias vol: 49 mL (ref 46–106)
LV sys vol: 20 mL
MPHR: 149 {beats}/min
Peak HR: 68 {beats}/min
Percent HR: 57 %
Percent of predicted max HR: 45 %
Rest HR: 56 {beats}/min
SDS: 0
SRS: 0
SSS: 0
Stage 1 Grade: 0 %
Stage 1 HR: 60 {beats}/min
Stage 1 Speed: 0 mph
Stage 2 Grade: 0 %
Stage 2 HR: 60 {beats}/min
Stage 2 Speed: 0 mph
Stage 3 Grade: 0 %
Stage 3 HR: 68 {beats}/min
Stage 3 Speed: 0 mph
Stage 4 Grade: 0 %
Stage 4 HR: 81 {beats}/min
Stage 4 Speed: 0 mph
Stage 5 DBP: 58 mmHg
Stage 5 Grade: 0 %
Stage 5 HR: 80 {beats}/min
Stage 5 SBP: 147 mmHg
Stage 5 Speed: 0 mph
TID: 1.24

## 2016-06-04 ENCOUNTER — Emergency Department
Admission: EM | Admit: 2016-06-04 | Discharge: 2016-06-04 | Disposition: A | Discharge status: Home / Self Care | Payer: Medicare Other | Attending: Emergency Medicine | Admitting: Emergency Medicine

## 2016-06-04 DIAGNOSIS — Z7982 Long term (current) use of aspirin: Secondary | ICD-10-CM | POA: Insufficient documentation

## 2016-06-04 DIAGNOSIS — Z79899 Other long term (current) drug therapy: Secondary | ICD-10-CM | POA: Insufficient documentation

## 2016-06-04 DIAGNOSIS — J45909 Unspecified asthma, uncomplicated: Secondary | ICD-10-CM | POA: Insufficient documentation

## 2016-06-04 DIAGNOSIS — F17200 Nicotine dependence, unspecified, uncomplicated: Secondary | ICD-10-CM | POA: Diagnosis not present

## 2016-06-04 DIAGNOSIS — J449 Chronic obstructive pulmonary disease, unspecified: Secondary | ICD-10-CM | POA: Diagnosis not present

## 2016-06-04 DIAGNOSIS — I10 Essential (primary) hypertension: Secondary | ICD-10-CM | POA: Insufficient documentation

## 2016-06-04 DIAGNOSIS — M542 Cervicalgia: Secondary | ICD-10-CM | POA: Insufficient documentation

## 2016-06-04 DIAGNOSIS — R0600 Dyspnea, unspecified: Secondary | ICD-10-CM | POA: Diagnosis present

## 2016-06-04 DIAGNOSIS — R0689 Other abnormalities of breathing: Secondary | ICD-10-CM

## 2016-06-04 MED ORDER — IPRATROPIUM-ALBUTEROL 0.5-2.5 (3) MG/3ML IN SOLN
3.0000 mL | Freq: Once | RESPIRATORY_TRACT | Status: AC
  Administered 2016-06-04: 3 mL via RESPIRATORY_TRACT

## 2016-06-04 MED ORDER — METHYLPREDNISOLONE SODIUM SUCC 125 MG IJ SOLR
INTRAMUSCULAR | Status: AC
  Administered 2016-06-04: 125 mg via INTRAVENOUS
  Filled 2016-06-04: qty 2

## 2016-06-04 MED ORDER — PREDNISONE 20 MG PO TABS: 40.0000 mg | ORAL_TABLET | Freq: Every day | ORAL | 0 refills | Status: DC

## 2016-06-04 MED ORDER — METHYLPREDNISOLONE SODIUM SUCC 125 MG IJ SOLR
125.0000 mg | Freq: Once | INTRAMUSCULAR | Status: AC
  Administered 2016-06-04: 125 mg via INTRAVENOUS

## 2016-06-04 MED ORDER — IPRATROPIUM-ALBUTEROL 0.5-2.5 (3) MG/3ML IN SOLN
RESPIRATORY_TRACT | Status: AC
  Administered 2016-06-04: 3 mL via RESPIRATORY_TRACT
  Filled 2016-06-04: qty 6

## 2016-06-04 NOTE — Discharge Instructions (Signed)
Please seek medical attention for any high fevers, chest pain, shortness of breath, change in behavior, persistent vomiting, bloody stool or any other new or concerning symptoms.  

## 2016-06-04 NOTE — ED Provider Notes (Signed)
[facility] Emergency Department Provider Note   ____________________________________________   I have reviewed the triage vital signs and the nursing notes.   HISTORY  Chief Complaint Neck Injury   History limited by: Not Limited   HPI Pamela James is a 71 y.o. female who presents to the emergency department today because of concerns for neck pain as well as cough and shortness of breath. Patient had 2 thyroid biopsies performed today at [facility] for thyroid nodules. In addition the patient was seen yesterday at urgent care for bronchitis. She was prescribed breathing treatments as well as antibiotics and cough medicine patient has continued to have cough. The cough is making her neck feel worse. Patient has not had any fevers.   Past Medical History:  Diagnosis Date  . Anxiety   . Asthma   . COPD (chronic obstructive pulmonary disease) ([place])   . Depression   . GERD (gastroesophageal reflux disease)   . Hypertension     Patient Active Problem List   Diagnosis Date Noted  . Acute bronchitis 03/26/2016  . Essential hypertension 03/26/2016  . Chest pain 03/25/2016    Past Surgical History:  Procedure Laterality Date  . ABDOMINAL HYSTERECTOMY    . TOTAL HIP ARTHROPLASTY Left   . TUBAL LIGATION      Prior to Admission medications   Medication Sig Start Date End Date Taking? Authorizing Provider  acetaminophen (TYLENOL) 500 MG tablet Take 500 mg by mouth every 6 (six) hours as needed.    Historical Provider, MD  albuterol (PROVENTIL HFA;VENTOLIN HFA) 108 (90 Base) MCG/ACT inhaler Inhale 2 puffs into the lungs every 6 (six) hours as needed for wheezing or shortness of breath.    Historical Provider, MD  amLODipine (NORVASC) 10 MG tablet Take 10 mg by mouth daily.    Historical Provider, MD  aspirin EC 81 MG EC tablet Take 1 tablet (81 mg total) by mouth daily. 03/27/16   Srikar Sudini, MD  azithromycin (ZITHROMAX) 500 MG tablet Take 1 tablet  (500 mg total) by mouth daily. 03/26/16   Hillary Bow, MD  carvedilol (COREG) 3.125 MG tablet Take 1 tablet (3.125 mg total) by mouth 2 (two) times daily with a meal. 03/26/16   Srikar Sudini, MD  escitalopram (LEXAPRO) 20 MG tablet Take 20 mg by mouth daily.    Historical Provider, MD  Fluticasone-Salmeterol (ADVAIR) 250-50 MCG/DOSE AEPB Inhale 1 puff into the lungs 2 (two) times daily.    Historical Provider, MD  ipratropium (ATROVENT HFA) 17 MCG/ACT inhaler Inhale 2 puffs into the lungs every 6 (six) hours.    Historical Provider, MD  lisinopril (PRINIVIL,ZESTRIL) 40 MG tablet Take 40 mg by mouth daily.    Historical Provider, MD  pravastatin (PRAVACHOL) 20 MG tablet Take 10 mg by mouth daily.    Historical Provider, MD  predniSONE (DELTASONE) 50 MG tablet Take 1 tablet (50 mg total) by mouth daily with breakfast. 03/27/16   Hillary Bow, MD  ranitidine (ZANTAC) 150 MG capsule Take 150 mg by mouth 2 (two) times daily.    Historical Provider, MD    Allergies Aspirin; Morphine and related; and Nsaids  Family History  Problem Relation Age of Onset  . Heart disease Father   . Heart disease Sister     Social History Social History  Substance Use Topics  . Smoking status: Current Every Day Smoker  . Smokeless tobacco: Never Used  . Alcohol use No    Review of Systems  Constitutional: Negative  for fever. Cardiovascular: Negative for chest pain. Respiratory: Positive for cough and shortness of breath. Gastrointestinal: Negative for abdominal pain, vomiting and diarrhea. Genitourinary: Negative for dysuria. Musculoskeletal: Positive for neck pain. Neurological: Negative for headaches, focal weakness or numbness.  10-point ROS otherwise negative.  ____________________________________________   PHYSICAL EXAM:  VITAL SIGNS: ED Triage Vitals  Enc Vitals Group     BP 06/04/16 2030 (!) 125/107     Pulse Rate 06/04/16 2030 78     Resp 06/04/16 2030 (!) 22     Temp --      Temp  src --      SpO2 06/04/16 2030 98 %     Weight 06/04/16 2030 167 lb (75.8 kg)     Height 06/04/16 2030 5\' 3"  (1.6 m)     Head Circumference --      Peak Flow --      Pain Score 06/04/16 2031 10   Constitutional: Alert and oriented. Well appearing and in no distress. Eyes: Conjunctivae are normal. Normal extraocular movements. ENT   Head: Normocephalic and atraumatic.   Nose: No congestion/rhinnorhea.   Mouth/Throat: Mucous membranes are moist.   Neck: No stridor. No swelling. No erythema. Diffusely tender to palpation. Hematological/Lymphatic/Immunilogical: No cervical lymphadenopathy. Cardiovascular: Normal rate, regular rhythm.  No murmurs, rubs, or gallops. Respiratory: Slightly increased work of breathing. Coarse breath sounds diffusely.  Gastrointestinal: Soft and non tender. No rebound. No guarding.  Genitourinary: Deferred Musculoskeletal: Normal range of motion in all extremities. No lower extremity edema. Neurologic:  Normal speech and language. No gross focal neurologic deficits are appreciated.  Skin:  Skin is warm, dry and intact. No rash noted. Psychiatric: Mood and affect are normal. Speech and behavior are normal. Patient exhibits appropriate insight and judgment.  ____________________________________________    LABS (pertinent positives/negatives)  None  ____________________________________________   EKG  None  ____________________________________________    RADIOLOGY  None   ____________________________________________   PROCEDURES  Procedures  ____________________________________________   INITIAL IMPRESSION / ASSESSMENT AND PLAN / ED COURSE  Pertinent labs & imaging results that were available during my care of the patient were reviewed by me and considered in my medical decision making (see chart for details).  Patient presented to the emergency department today with concerns for neck pain after thyroid biopsy today as well  as some difficulty with shortness breath. She has a COPD and did have coarse breath sounds diffusely. She was given solumedrol DuoNeb's and felt much better after that. Terms of the neck no erythema, swelling. No stridor. At this point think safe for discharge. Will follow up with outpatient doctors.   ____________________________________________   FINAL CLINICAL IMPRESSION(S) / ED DIAGNOSES  Final diagnoses:  Neck pain  Breathing difficulty  Chronic obstructive pulmonary disease, unspecified COPD type ([place])     Note: This dictation was prepared with Dragon dictation. Any transcriptional errors that result from this process are unintentional     Nance Pear, MD 06/04/16 2230

## 2016-06-04 NOTE — ED Triage Notes (Signed)
Pt had thyroid biopsy today and here for pain to insertion site, pt also co cough and shob, shob noted at this time with audible wheezing. Pt started on antibiotics yesterday for acute bronchitis.

## 2017-04-27 DIAGNOSIS — F319 Bipolar disorder, unspecified: Secondary | ICD-10-CM | POA: Diagnosis not present

## 2017-04-27 DIAGNOSIS — E559 Vitamin D deficiency, unspecified: Secondary | ICD-10-CM | POA: Diagnosis not present

## 2017-04-27 DIAGNOSIS — Z23 Encounter for immunization: Secondary | ICD-10-CM | POA: Diagnosis not present

## 2017-04-27 DIAGNOSIS — I10 Essential (primary) hypertension: Secondary | ICD-10-CM | POA: Diagnosis not present

## 2017-04-27 DIAGNOSIS — E7800 Pure hypercholesterolemia, unspecified: Secondary | ICD-10-CM | POA: Diagnosis not present

## 2017-04-27 DIAGNOSIS — E213 Hyperparathyroidism, unspecified: Secondary | ICD-10-CM | POA: Diagnosis not present

## 2017-08-02 DIAGNOSIS — J209 Acute bronchitis, unspecified: Secondary | ICD-10-CM | POA: Diagnosis not present

## 2017-08-02 DIAGNOSIS — R062 Wheezing: Secondary | ICD-10-CM | POA: Diagnosis not present

## 2017-09-15 DIAGNOSIS — J4530 Mild persistent asthma, uncomplicated: Secondary | ICD-10-CM | POA: Diagnosis not present

## 2017-09-15 DIAGNOSIS — Z96642 Presence of left artificial hip joint: Secondary | ICD-10-CM | POA: Diagnosis not present

## 2017-09-15 DIAGNOSIS — J449 Chronic obstructive pulmonary disease, unspecified: Secondary | ICD-10-CM | POA: Diagnosis not present

## 2017-09-28 DIAGNOSIS — E7800 Pure hypercholesterolemia, unspecified: Secondary | ICD-10-CM | POA: Diagnosis not present

## 2017-09-28 DIAGNOSIS — M25552 Pain in left hip: Secondary | ICD-10-CM | POA: Diagnosis not present

## 2017-09-28 DIAGNOSIS — E213 Hyperparathyroidism, unspecified: Secondary | ICD-10-CM | POA: Diagnosis not present

## 2017-09-28 DIAGNOSIS — N644 Mastodynia: Secondary | ICD-10-CM | POA: Diagnosis not present

## 2017-09-28 DIAGNOSIS — I10 Essential (primary) hypertension: Secondary | ICD-10-CM | POA: Diagnosis not present

## 2017-09-28 DIAGNOSIS — Z96642 Presence of left artificial hip joint: Secondary | ICD-10-CM | POA: Diagnosis not present

## 2017-09-28 DIAGNOSIS — E559 Vitamin D deficiency, unspecified: Secondary | ICD-10-CM | POA: Diagnosis not present

## 2017-09-28 DIAGNOSIS — M1612 Unilateral primary osteoarthritis, left hip: Secondary | ICD-10-CM | POA: Diagnosis not present

## 2017-09-28 DIAGNOSIS — E041 Nontoxic single thyroid nodule: Secondary | ICD-10-CM | POA: Diagnosis not present

## 2017-09-28 DIAGNOSIS — J4530 Mild persistent asthma, uncomplicated: Secondary | ICD-10-CM | POA: Diagnosis not present

## 2017-11-21 ENCOUNTER — Other Ambulatory Visit: Payer: Self-pay

## 2017-11-21 ENCOUNTER — Encounter: Payer: Self-pay | Admitting: Emergency Medicine

## 2017-11-21 ENCOUNTER — Emergency Department: Payer: Medicare HMO

## 2017-11-21 ENCOUNTER — Inpatient Hospital Stay
Admission: EM | Admit: 2017-11-21 | Discharge: 2017-11-23 | DRG: 192 | Disposition: A | Discharge status: Home Health | Payer: Medicare HMO | Attending: Internal Medicine | Admitting: Internal Medicine

## 2017-11-21 DIAGNOSIS — Z7982 Long term (current) use of aspirin: Secondary | ICD-10-CM | POA: Diagnosis not present

## 2017-11-21 DIAGNOSIS — Z885 Allergy status to narcotic agent status: Secondary | ICD-10-CM | POA: Diagnosis not present

## 2017-11-21 DIAGNOSIS — E785 Hyperlipidemia, unspecified: Secondary | ICD-10-CM | POA: Diagnosis not present

## 2017-11-21 DIAGNOSIS — K219 Gastro-esophageal reflux disease without esophagitis: Secondary | ICD-10-CM | POA: Diagnosis not present

## 2017-11-21 DIAGNOSIS — Z96642 Presence of left artificial hip joint: Secondary | ICD-10-CM | POA: Diagnosis present

## 2017-11-21 DIAGNOSIS — F17200 Nicotine dependence, unspecified, uncomplicated: Secondary | ICD-10-CM | POA: Diagnosis present

## 2017-11-21 DIAGNOSIS — I10 Essential (primary) hypertension: Secondary | ICD-10-CM | POA: Diagnosis present

## 2017-11-21 DIAGNOSIS — Z886 Allergy status to analgesic agent status: Secondary | ICD-10-CM | POA: Diagnosis not present

## 2017-11-21 DIAGNOSIS — J441 Chronic obstructive pulmonary disease with (acute) exacerbation: Principal | ICD-10-CM | POA: Diagnosis present

## 2017-11-21 DIAGNOSIS — F419 Anxiety disorder, unspecified: Secondary | ICD-10-CM | POA: Diagnosis present

## 2017-11-21 DIAGNOSIS — Z8249 Family history of ischemic heart disease and other diseases of the circulatory system: Secondary | ICD-10-CM | POA: Diagnosis not present

## 2017-11-21 DIAGNOSIS — Z7951 Long term (current) use of inhaled steroids: Secondary | ICD-10-CM | POA: Diagnosis not present

## 2017-11-21 DIAGNOSIS — F329 Major depressive disorder, single episode, unspecified: Secondary | ICD-10-CM | POA: Diagnosis present

## 2017-11-21 DIAGNOSIS — Z79899 Other long term (current) drug therapy: Secondary | ICD-10-CM

## 2017-11-21 DIAGNOSIS — J209 Acute bronchitis, unspecified: Secondary | ICD-10-CM | POA: Diagnosis present

## 2017-11-21 DIAGNOSIS — J449 Chronic obstructive pulmonary disease, unspecified: Secondary | ICD-10-CM | POA: Diagnosis present

## 2017-11-21 DIAGNOSIS — Z90710 Acquired absence of both cervix and uterus: Secondary | ICD-10-CM

## 2017-11-21 DIAGNOSIS — Z716 Tobacco abuse counseling: Secondary | ICD-10-CM | POA: Diagnosis not present

## 2017-11-21 DIAGNOSIS — R0602 Shortness of breath: Secondary | ICD-10-CM | POA: Diagnosis not present

## 2017-11-21 DIAGNOSIS — Z720 Tobacco use: Secondary | ICD-10-CM | POA: Diagnosis not present

## 2017-11-21 DIAGNOSIS — R079 Chest pain, unspecified: Secondary | ICD-10-CM | POA: Diagnosis not present

## 2017-11-21 LAB — BASIC METABOLIC PANEL
Anion gap: 7 (ref 5–15)
BUN: 13 mg/dL (ref 8–23)
CO2: 31 mmol/L (ref 22–32)
Calcium: 9.9 mg/dL (ref 8.9–10.3)
Chloride: 106 mmol/L (ref 98–111)
Creatinine, Ser: 0.61 mg/dL (ref 0.44–1.00)
GFR calc Af Amer: >60 mL/min (ref >60)
GFR calc non Af Amer: >60 mL/min (ref >60)
Glucose, Bld: 105 mg/dL — ABNORMAL HIGH (ref 70–99)
Potassium: 4.1 mmol/L (ref 3.5–5.1)
Sodium: 144 mmol/L (ref 135–145)

## 2017-11-21 LAB — BRAIN NATRIURETIC PEPTIDE: B Natriuretic Peptide: 75 pg/mL (ref 0.0–100.0)

## 2017-11-21 LAB — CBC
HCT: 42.3 % (ref 35.0–47.0)
Hemoglobin: 14.1 g/dL (ref 12.0–16.0)
MCH: 31.7 pg (ref 26.0–34.0)
MCHC: 33.4 g/dL (ref 32.0–36.0)
MCV: 94.9 fL (ref 80.0–100.0)
Platelets: 505 10*3/uL — ABNORMAL HIGH (ref 150–440)
RBC: 4.46 MIL/uL (ref 3.80–5.20)
RDW: 15.7 % — ABNORMAL HIGH (ref 11.5–14.5)
WBC: 7.3 10*3/uL (ref 3.6–11.0)

## 2017-11-21 LAB — TROPONIN I: Troponin I: <0.03 ng/mL (ref <0.03)

## 2017-11-21 MED ORDER — SENNOSIDES-DOCUSATE SODIUM 8.6-50 MG PO TABS: 1.0000 | ORAL_TABLET | Freq: Every evening | ORAL | Status: DC | PRN

## 2017-11-21 MED ORDER — ONDANSETRON HCL 4 MG/2ML IJ SOLN: 4.0000 mg | Freq: Four times a day (QID) | INTRAMUSCULAR | Status: DC | PRN

## 2017-11-21 MED ORDER — METHYLPREDNISOLONE SODIUM SUCC 125 MG IJ SOLR
125.0000 mg | Freq: Once | INTRAMUSCULAR | Status: AC
  Administered 2017-11-21: 125 mg via INTRAVENOUS
  Filled 2017-11-21: qty 2

## 2017-11-21 MED ORDER — LISINOPRIL 10 MG PO TABS
40.0000 mg | ORAL_TABLET | Freq: Every day | ORAL | Status: DC
  Administered 2017-11-22 – 2017-11-23 (×2): 40 mg via ORAL
  Filled 2017-11-21 (×2): qty 4

## 2017-11-21 MED ORDER — PRAVASTATIN SODIUM 10 MG PO TABS
10.0000 mg | ORAL_TABLET | Freq: Every day | ORAL | Status: DC
  Administered 2017-11-21 – 2017-11-23 (×3): 10 mg via ORAL
  Filled 2017-11-21 (×3): qty 1

## 2017-11-21 MED ORDER — CARVEDILOL 3.125 MG PO TABS
3.1250 mg | ORAL_TABLET | Freq: Two times a day (BID) | ORAL | Status: DC
  Administered 2017-11-21 – 2017-11-22 (×2): 3.125 mg via ORAL
  Filled 2017-11-21: qty 0.5
  Filled 2017-11-21 (×2): qty 1
  Filled 2017-11-21 (×2): qty 0.5

## 2017-11-21 MED ORDER — SODIUM CHLORIDE 0.9% FLUSH
3.0000 mL | Freq: Two times a day (BID) | INTRAVENOUS | Status: DC
  Administered 2017-11-21 – 2017-11-23 (×4): 3 mL via INTRAVENOUS

## 2017-11-21 MED ORDER — SODIUM CHLORIDE 0.9 % IV SOLN: 250.0000 mL | INTRAVENOUS | Status: DC | PRN

## 2017-11-21 MED ORDER — IPRATROPIUM-ALBUTEROL 0.5-2.5 (3) MG/3ML IN SOLN
6.0000 mL | Freq: Once | RESPIRATORY_TRACT | Status: AC
  Administered 2017-11-21: 6 mL via RESPIRATORY_TRACT

## 2017-11-21 MED ORDER — METHYLPREDNISOLONE SODIUM SUCC 125 MG IJ SOLR
60.0000 mg | Freq: Four times a day (QID) | INTRAMUSCULAR | Status: DC
  Administered 2017-11-21 – 2017-11-22 (×3): 60 mg via INTRAVENOUS
  Filled 2017-11-21 (×3): qty 2

## 2017-11-21 MED ORDER — ONDANSETRON HCL 4 MG PO TABS: 4.0000 mg | ORAL_TABLET | Freq: Four times a day (QID) | ORAL | Status: DC | PRN

## 2017-11-21 MED ORDER — INFLUENZA VAC SPLIT HIGH-DOSE 0.5 ML IM SUSY
0.5000 mL | PREFILLED_SYRINGE | INTRAMUSCULAR | Status: DC
  Filled 2017-11-21: qty 0.5

## 2017-11-21 MED ORDER — NICOTINE 21 MG/24HR TD PT24
21.0000 mg | MEDICATED_PATCH | Freq: Every day | TRANSDERMAL | Status: DC
  Administered 2017-11-21 – 2017-11-23 (×3): 21 mg via TRANSDERMAL
  Filled 2017-11-21 (×3): qty 1

## 2017-11-21 MED ORDER — ALBUTEROL SULFATE (2.5 MG/3ML) 0.083% IN NEBU
5.0000 mg | INHALATION_SOLUTION | Freq: Once | RESPIRATORY_TRACT | Status: AC
  Administered 2017-11-21: 5 mg via RESPIRATORY_TRACT
  Filled 2017-11-21: qty 6

## 2017-11-21 MED ORDER — IPRATROPIUM-ALBUTEROL 0.5-2.5 (3) MG/3ML IN SOLN: 3.0000 mL | Freq: Four times a day (QID) | RESPIRATORY_TRACT | Status: DC

## 2017-11-21 MED ORDER — IPRATROPIUM-ALBUTEROL 0.5-2.5 (3) MG/3ML IN SOLN
3.0000 mL | Freq: Four times a day (QID) | RESPIRATORY_TRACT | Status: DC
  Administered 2017-11-21 – 2017-11-23 (×8): 3 mL via RESPIRATORY_TRACT
  Filled 2017-11-21 (×8): qty 3

## 2017-11-21 MED ORDER — AMLODIPINE BESYLATE 5 MG PO TABS
10.0000 mg | ORAL_TABLET | Freq: Every day | ORAL | Status: DC
  Administered 2017-11-22 – 2017-11-23 (×2): 10 mg via ORAL
  Filled 2017-11-21 (×2): qty 2

## 2017-11-21 MED ORDER — ASPIRIN EC 81 MG PO TBEC
81.0000 mg | DELAYED_RELEASE_TABLET | Freq: Every day | ORAL | Status: DC
  Administered 2017-11-22 – 2017-11-23 (×2): 81 mg via ORAL
  Filled 2017-11-21 (×2): qty 1

## 2017-11-21 MED ORDER — IPRATROPIUM-ALBUTEROL 0.5-2.5 (3) MG/3ML IN SOLN
RESPIRATORY_TRACT | Status: AC
  Administered 2017-11-21: 6 mL via RESPIRATORY_TRACT
  Filled 2017-11-21: qty 6

## 2017-11-21 MED ORDER — FAMOTIDINE 20 MG PO TABS
10.0000 mg | ORAL_TABLET | Freq: Every day | ORAL | Status: DC
  Administered 2017-11-22 – 2017-11-23 (×2): 10 mg via ORAL
  Filled 2017-11-21 (×2): qty 1

## 2017-11-21 MED ORDER — ENOXAPARIN SODIUM 40 MG/0.4ML ~~LOC~~ SOLN
40.0000 mg | SUBCUTANEOUS | Status: DC
  Administered 2017-11-21 – 2017-11-22 (×2): 40 mg via SUBCUTANEOUS
  Filled 2017-11-21 (×2): qty 0.4

## 2017-11-21 MED ORDER — ACETAMINOPHEN 325 MG PO TABS
650.0000 mg | ORAL_TABLET | Freq: Four times a day (QID) | ORAL | Status: DC | PRN
  Administered 2017-11-22: 650 mg via ORAL
  Filled 2017-11-21: qty 2

## 2017-11-21 MED ORDER — ACETAMINOPHEN 650 MG RE SUPP: 650.0000 mg | Freq: Four times a day (QID) | RECTAL | Status: DC | PRN

## 2017-11-21 MED ORDER — PNEUMOCOCCAL VAC POLYVALENT 25 MCG/0.5ML IJ INJ: 0.5000 mL | INJECTION | INTRAMUSCULAR | Status: DC

## 2017-11-21 MED ORDER — ESCITALOPRAM OXALATE 10 MG PO TABS
20.0000 mg | ORAL_TABLET | Freq: Every day | ORAL | Status: DC
  Administered 2017-11-22 – 2017-11-23 (×2): 20 mg via ORAL
  Filled 2017-11-21 (×3): qty 2

## 2017-11-21 MED ORDER — SODIUM CHLORIDE 0.9% FLUSH: 3.0000 mL | INTRAVENOUS | Status: DC | PRN

## 2017-11-21 NOTE — ED Notes (Signed)
ED Provider at bedside. 

## 2017-11-21 NOTE — ED Provider Notes (Addendum)
[facility] Emergency Department Provider Note  ____________________________________________   I have reviewed the triage vital signs and the nursing notes. Where available I have reviewed prior notes and, if possible and indicated, outside hospital notes.    HISTORY  Chief Complaint Shortness of Breath    HPI Pamela James is a 72 y.o. female who presents today complaining of shortness of breath.  She states she has had it on and off for years.  Worse over the last couple weeks.  Does have a history of anxiety and COPD, hypertension.  No known history of ACS.  Patient's last echo was at this facility on March 25, 2016 at which time she had a 55 to 60%, perhaps grade 1 diastolic dysfunction.  Patient has had no chest pain at this time she states sometimes she has a pain, she also states that she has a intermittent cough for months, sometimes with production sometimes with nausea, recently has been a little bit of clear mucus.  Chest pain seems to happen most with cough, patient cannot quantify it or characterize it any further.  She states "sometimes" when I asked about chest pain but does not have any further information.  Denies leg swelling.  Or in fact states she does not know and then looks at her legs and says no. Level 5 chart caveat; no further history available due to patient status.     Past Medical History:  Diagnosis Date  . Anxiety   . Asthma   . COPD (chronic obstructive pulmonary disease) ([place])   . Depression   . GERD (gastroesophageal reflux disease)   . Hypertension     Patient Active Problem List   Diagnosis Date Noted  . Acute bronchitis 03/26/2016  . Essential hypertension 03/26/2016  . Chest pain 03/25/2016    Past Surgical History:  Procedure Laterality Date  . ABDOMINAL HYSTERECTOMY    . TOTAL HIP ARTHROPLASTY Left   . TUBAL LIGATION      Prior to Admission medications   Medication Sig Start Date End Date Taking? Authorizing  Provider  acetaminophen (TYLENOL) 500 MG tablet Take 500 mg by mouth every 6 (six) hours as needed.    [provider]  albuterol (PROVENTIL HFA;VENTOLIN HFA) 108 (90 Base) MCG/ACT inhaler Inhale 2 puffs into the lungs every 6 (six) hours as needed for wheezing or shortness of breath.    [provider]  amLODipine (NORVASC) 10 MG tablet Take 10 mg by mouth daily.    [provider]  aspirin EC 81 MG EC tablet Take 1 tablet (81 mg total) by mouth daily. 03/27/16   Hillary Bow, MD  azithromycin (ZITHROMAX) 500 MG tablet Take 1 tablet (500 mg total) by mouth daily. 03/26/16   Hillary Bow, MD  carvedilol (COREG) 3.125 MG tablet Take 1 tablet (3.125 mg total) by mouth 2 (two) times daily with a meal. 03/26/16   Sudini, Srikar, MD  escitalopram (LEXAPRO) 20 MG tablet Take 20 mg by mouth daily.    [provider]  Fluticasone-Salmeterol (ADVAIR) 250-50 MCG/DOSE AEPB Inhale 1 puff into the lungs 2 (two) times daily.    [provider]  ipratropium (ATROVENT HFA) 17 MCG/ACT inhaler Inhale 2 puffs into the lungs every 6 (six) hours.    [provider]  lisinopril (PRINIVIL,ZESTRIL) 40 MG tablet Take 40 mg by mouth daily.    [provider]  pravastatin (PRAVACHOL) 20 MG tablet Take 10 mg by mouth daily.    [provider]  predniSONE (DELTASONE) 20 MG tablet Take 2 tablets (40 mg total) by mouth daily. 06/04/16   Nance Pear, MD  predniSONE (DELTASONE) 50 MG tablet Take 1 tablet (50 mg total) by mouth daily with breakfast. 03/27/16   Hillary Bow, MD  ranitidine (ZANTAC) 150 MG capsule Take 150 mg by mouth 2 (two) times daily.    [provider]    Allergies Aspirin; Morphine and related; and Nsaids  Family History  Problem Relation Age of Onset  . Heart disease Father   . Heart disease Sister     Social History Social History   Tobacco Use  . Smoking status: Current Every Day Smoker  . Smokeless tobacco:  Never Used  Substance Use Topics  . Alcohol use: No  . Drug use: No    Review of Systems Constitutional: No fever/chills Eyes: No visual changes. ENT: No sore throat. No stiff neck no neck pain Cardiovascular: Denies ongoing chest pain states "sometimes she has chest pain" Respiratory: + shortness of breath. Gastrointestinal:   no vomiting.  No diarrhea.  No constipation. Genitourinary: Negative for dysuria. Musculoskeletal: Negative lower extremity swelling Skin: Negative for rash. Neurological: Negative for severe headaches, focal weakness or numbness.   ____________________________________________   PHYSICAL EXAM:  VITAL SIGNS: ED Triage Vitals  Enc Vitals Group     BP 11/21/17 1305 (!) 150/64     Pulse Rate 11/21/17 1305 75     Resp 11/21/17 1305 (!) 40     Temp 11/21/17 1305 98.4 F (36.9 C)     Temp Source 11/21/17 1305 Oral     SpO2 11/21/17 1305 90 %     Weight 11/21/17 1306 167 lb (75.8 kg)     Height 11/21/17 1306 5\' 3"  (1.6 m)     Head Circumference --      Peak Flow --      Pain Score 11/21/17 1306 0     Pain Loc --      Pain Edu? --      Excl. in [place]? --     Constitutional: Alert and oriented.  She has pursed lip breathing but otherwise is in no acute distress eyes: Conjunctivae are normal Head: Atraumatic HEENT: No congestion/rhinnorhea. Mucous membranes are moist.  Oropharynx non-erythematous Neck:   Nontender with no meningismus, no masses, no stridor Cardiovascular: Normal rate, regular rhythm. Grossly normal heart sounds.  Good peripheral circulation. Respiratory: Increased respiratory effort increased respiratory rate, occasional diffuse wheezes no rales or rhonchi's Abdominal: Soft and nontender. No distention. No guarding no rebound Back:  There is no focal tenderness or step off.  there is no midline tenderness there are no lesions noted. there is no CVA tenderness Musculoskeletal: No lower extremity tenderness, no upper extremity tenderness.  No joint effusions, no DVT signs strong distal pulses no edema Neurologic:  Normal speech and language. No gross focal neurologic deficits are appreciated.  Skin:  Skin is warm, dry and intact. No rash noted. Psychiatric: Mood and affect are normal. Speech and behavior are normal.  ____________________________________________   LABS (all labs ordered are listed, but only abnormal results are displayed)  Labs Reviewed  BASIC METABOLIC PANEL  CBC  TROPONIN I  BRAIN NATRIURETIC PEPTIDE    Pertinent labs  results that were available during my care of the patient were reviewed by me and considered in my medical decision making (see chart for details). ____________________________________________  EKG  I personally interpreted any EKGs ordered by me or triage Sinus rhythm  rate 71 bpm no acute ST elevation or depression normal axis unremarkable EKG ____________________________________________  RADIOLOGY  Pertinent labs & imaging results that were available during my care of the patient were reviewed by me and considered in my medical decision making (see chart for details). If possible, patient and/or family made aware of any abnormal findings.  No results found. ____________________________________________    PROCEDURES  Procedure(s) performed: None  Procedures  Critical Care performed: CRITICAL CARE Performed by: Schuyler Amor   Total critical care time: 38 minutes  Critical care time was exclusive of separately billable procedures and treating other patients.  Critical care was necessary to treat or prevent imminent or life-threatening deterioration.  Critical care was time spent personally by me on the following activities: development of treatment plan with patient and/or surrogate as well as nursing, discussions with consultants, evaluation of patient's response to treatment, examination of patient, obtaining history from patient or surrogate, ordering and  performing treatments and interventions, ordering and review of laboratory studies, ordering and review of radiographic studies, pulse oximetry and re-evaluation of patient's condition.   ____________________________________________   INITIAL IMPRESSION / ASSESSMENT AND PLAN / ED COURSE  Pertinent labs & imaging results that were available during my care of the patient were reviewed by me and considered in my medical decision making (see chart for details).  Here with concern about her breathing.  Does appear to be acutely dyspneic, able to speak in 4-5 word sentences but has pursed lip breathing.  She is on oxygen at home sats are 90 to 95% here, we are giving her albuterol, she does have a wheeze, I am also giving her Solu-Medrol, we will evaluate for ACS, CHF, pneumonia and reassess.  ----------------------------------------- 3:01 PM on 11/21/2017 -----------------------------------------  Consistent with uncomplicated but significant COPD flare.  Does not require BiPAP at this time after 2 different continuous nebs patient is doing better.  We will admit to the hospitalist service.  She is not struggling to breathe she is speaking in full sentences are still very slightly appreciated wheeze, steroids should help with that.  No evidence of infection I do not think pneumonia is present nor do I think antibiotics are not necessarily indicated.   ____________________________________________   FINAL CLINICAL IMPRESSION(S) / ED DIAGNOSES  Final diagnoses:  None      This chart was dictated using voice recognition software.  Despite best efforts to proofread,  errors can occur which can change meaning.      Schuyler Amor, MD 11/21/17 1333    Schuyler Amor, MD 11/21/17 1501

## 2017-11-21 NOTE — Progress Notes (Signed)
Advanced care plan.  Purpose of the Encounter: CODE STATUS  Parties in Attendance: Patient  Patient's Decision Capacity: Good  Subjective/Patient's story: Presented to the emergency room for shortness of breath and wheezing   Objective/Medical story Has COPD exacerbation Needs nebulization treatments and steroids   Goals of care determination:  Advance care directives goals of care discussed with the patient Tobacco cessation discussed with the patient Patient for now wants everything done which includes CPR and intubation and ventilator if the need arises   CODE STATUS: Full code   Time spent discussing advanced care planning: 16 minutes

## 2017-11-21 NOTE — H&P (Signed)
[facility] [place] NAME: Pamela James    MR#:  [id number]  DATE OF BIRTH:  05-11-1945  DATE OF ADMISSION:  11/21/2017  PRIMARY CARE PHYSICIAN: Cindra Eves, MD   REQUESTING/REFERRING PHYSICIAN:   CHIEF COMPLAINT:   Chief Complaint  Patient presents with  . Shortness of Breath    HISTORY OF PRESENT ILLNESS: Pamela James  is a 72 y.o. female with a known history of COPD not on home oxygen, GERD, hypertension, anxiety disorder presented to the emergency room for shortness of breath and wheezing for the last few days.  No history of any fever, chills.  Patient tried some inhaler treatments at home but did not help.  She was given IV Solu-Medrol in the emergency room and nebulization treatments were given.  Hospitalist service was consulted for further care.  PAST MEDICAL HISTORY:   Past Medical History:  Diagnosis Date  . Anxiety   . Asthma   . COPD (chronic obstructive pulmonary disease) ([place])   . Depression   . GERD (gastroesophageal reflux disease)   . Hypertension     PAST SURGICAL HISTORY:  Past Surgical History:  Procedure Laterality Date  . ABDOMINAL HYSTERECTOMY    . TOTAL HIP ARTHROPLASTY Left   . TUBAL LIGATION      SOCIAL HISTORY:  Social History   Tobacco Use  . Smoking status: Current Every Day Smoker  . Smokeless tobacco: Never Used  Substance Use Topics  . Alcohol use: No    FAMILY HISTORY:  Family History  Problem Relation Age of Onset  . Heart disease Father   . Heart disease Sister     DRUG ALLERGIES:  Allergies  Allergen Reactions  . Aspirin Other (See Comments)    GI ulcers  . Morphine And Related Other (See Comments)    Delerium, itching  . Nsaids Other (See Comments)    GI ulcers    REVIEW OF SYSTEMS:   CONSTITUTIONAL: No fever, fatigue or weakness.  EYES: No blurred or double vision.  EARS, NOSE, AND THROAT: No tinnitus or ear pain.  RESPIRATORY: No cough, hasshortness of  breath, wheezing  no hemoptysis.  CARDIOVASCULAR: No chest pain, orthopnea, edema.  GASTROINTESTINAL: No nausea, vomiting, diarrhea or abdominal pain.  GENITOURINARY: No dysuria, hematuria.  ENDOCRINE: No polyuria, nocturia,  HEMATOLOGY: No anemia, easy bruising or bleeding SKIN: No rash or lesion. MUSCULOSKELETAL: No joint pain or arthritis.   NEUROLOGIC: No tingling, numbness, weakness.  PSYCHIATRY: No anxiety or depression.   MEDICATIONS AT HOME:  Prior to Admission medications   Medication Sig Start Date End Date Taking? Authorizing Provider  acetaminophen (TYLENOL) 500 MG tablet Take 500-1,000 mg by mouth every 6 (six) hours as needed for moderate pain or fever.    Yes [provider]  albuterol (ACCUNEB) 1.25 MG/3ML nebulizer solution Take 1 ampule by nebulization every 6 (six) hours as needed for wheezing or shortness of breath.   Yes [provider]  albuterol (PROVENTIL HFA;VENTOLIN HFA) 108 (90 Base) MCG/ACT inhaler Inhale 2 puffs into the lungs every 6 (six) hours as needed for wheezing or shortness of breath.   Yes [provider]  amLODipine (NORVASC) 10 MG tablet Take 10 mg by mouth daily.   Yes [provider]  aspirin EC 81 MG EC tablet Take 1 tablet (81 mg total) by mouth daily. 03/27/16  Yes Sudini, Alveta Heimlich, MD  carvedilol (COREG) 6.25 MG tablet Take 6.25 mg by mouth 2 (two) times  daily with a meal.   Yes [provider]  Fluticasone-Salmeterol (ADVAIR) 250-50 MCG/DOSE AEPB Inhale 1 puff into the lungs every 12 (twelve) hours.    Yes [provider]  ipratropium (ATROVENT HFA) 17 MCG/ACT inhaler Inhale 2 puffs into the lungs 3 (three) times daily.    Yes [provider]  lisinopril (PRINIVIL,ZESTRIL) 40 MG tablet Take 40 mg by mouth daily.   Yes [provider]  omeprazole (PRILOSEC) 20 MG capsule Take 20 mg by mouth daily.   Yes [provider]  pravastatin (PRAVACHOL) 20 MG tablet Take 20 mg by  mouth every evening.    Yes [provider]  ranitidine (ZANTAC) 150 MG capsule Take 150 mg by mouth 2 (two) times daily.   Yes [provider]      PHYSICAL EXAMINATION:   VITAL SIGNS: Blood pressure (!) 195/75, pulse 75, temperature 98.2 F (36.8 C), temperature source Oral, resp. rate 20, height 5\' 3"  (1.6 m), weight 75.8 kg, SpO2 91 %.  GENERAL:  72 y.o.-year-old patient lying in the bed with no acute distress.  EYES: Pupils equal, round, reactive to light and accommodation. No scleral icterus. Extraocular muscles intact.  HEENT: Head atraumatic, normocephalic. Oropharynx and nasopharynx clear.  NECK:  Supple, no jugular venous distention. No thyroid enlargement, no tenderness.  LUNGS: Decreased breath sounds bilaterally, bilateral wheezing. No use of accessory muscles of respiration.  CARDIOVASCULAR: S1, S2 normal. No murmurs, rubs, or gallops.  ABDOMEN: Soft, nontender, nondistended. Bowel sounds present. No organomegaly or mass.  EXTREMITIES: No pedal edema, cyanosis, or clubbing.  NEUROLOGIC: Cranial nerves II through XII are intact. Muscle strength 5/5 in all extremities. Sensation intact. Gait not checked.  PSYCHIATRIC: The patient is alert and oriented x 3.  SKIN: No obvious rash, lesion, or ulcer.   LABORATORY PANEL:   CBC Recent Labs  Lab 11/21/17 1322  WBC 7.3  HGB 14.1  HCT 42.3  PLT 505*  MCV 94.9  MCH 31.7  MCHC 33.4  RDW 15.7*   ------------------------------------------------------------------------------------------------------------------  Chemistries  Recent Labs  Lab 11/21/17 1322  NA 144  K 4.1  CL 106  CO2 31  GLUCOSE 105*  BUN 13  CREATININE 0.61  CALCIUM 9.9   ------------------------------------------------------------------------------------------------------------------ estimated creatinine clearance is 61.1 mL/min (by C-G formula based on SCr of 0.61  mg/dL). ------------------------------------------------------------------------------------------------------------------ No results for input(s): TSH, T4TOTAL, T3FREE, THYROIDAB in the last 72 hours.  Invalid input(s): FREET3   Coagulation profile No results for input(s): INR, PROTIME in the last 168 hours. ------------------------------------------------------------------------------------------------------------------- No results for input(s): DDIMER in the last 72 hours. -------------------------------------------------------------------------------------------------------------------  Cardiac Enzymes Recent Labs  Lab 11/21/17 1322  TROPONINI <0.03   ------------------------------------------------------------------------------------------------------------------ Invalid input(s): POCBNP  ---------------------------------------------------------------------------------------------------------------  Urinalysis No results found for: COLORURINE, APPEARANCEUR, LABSPEC, PHURINE, GLUCOSEU, HGBUR, BILIRUBINUR, KETONESUR, PROTEINUR, UROBILINOGEN, NITRITE, LEUKOCYTESUR   RADIOLOGY: Dg Chest Portable 1 View  Result Date: 11/21/2017 CLINICAL DATA:  Shortness of breath. EXAM: PORTABLE CHEST 1 VIEW COMPARISON:  None. FINDINGS: The heart size and mediastinal contours are within normal limits. Both lungs are clear. The visualized skeletal structures are unremarkable. IMPRESSION: No active disease. Electronically Signed   By: Dorise Bullion III M.D   On: 11/21/2017 13:58    EKG: Orders placed or performed during the hospital encounter of 11/21/17  . ED EKG  . ED EKG  . EKG 12-Lead  . EKG 12-Lead    IMPRESSION AND PLAN:  72 year old female patient with history of COPD not on home oxygen, hypertension, GERD,  anxiety disorder presented to the eminence room for shortness of breath and wheezing  -Acute COPD exacerbation Admit patient to medical floor IV Solu-Medrol 60 MDQ 6  hourly Nebulization treatments aggressively Incentive spirometry  -Tobacco abuse Tobacco cessation counseled to the patient for 6 minutes Nicotine patch offered  -DVT prophylaxis subcu Lovenox daily  -Hypertension Resume ACE inhibitor and beta-blocker  All the records are reviewed and case discussed with ED provider. Management plans discussed with the patient, family and they are in agreement.  CODE STATUS:Full code    Code Status Orders  (From admission, onward)         Start     Ordered   11/21/17 1552  Full code  Continuous     11/21/17 1551        Code Status History    Date Active Date Inactive Code Status Order ID Comments User Context   03/25/2016 0318 03/26/2016 2014 Full Code [id number]  Saundra Shelling, MD ED       TOTAL TIME TAKING CARE OF THIS PATIENT: 53 minutes.    Saundra Shelling M.D on 11/21/2017 at 4:44 PM  Between 7am to 6pm - Pager - [phone] Ascom [phone]  After 6pm go to [email or web address] - password [email or web address] [facility]  Office  [phone]  CC: Primary care physician; Cindra Eves, MD

## 2017-11-21 NOTE — ED Triage Notes (Signed)
Patient presents to the ED very short of breath, having difficulty speaking in sentences with audible inspiratory and expiratory wheezing.  Patient has history of COPD.  Patient has retractions and using accessory muscles to breathe.

## 2017-11-22 LAB — BASIC METABOLIC PANEL
Anion gap: 7 (ref 5–15)
BUN: 19 mg/dL (ref 8–23)
CO2: 30 mmol/L (ref 22–32)
Calcium: 10.1 mg/dL (ref 8.9–10.3)
Chloride: 106 mmol/L (ref 98–111)
Creatinine, Ser: 0.76 mg/dL (ref 0.44–1.00)
GFR calc Af Amer: >60 mL/min (ref >60)
GFR calc non Af Amer: >60 mL/min (ref >60)
Glucose, Bld: 150 mg/dL — ABNORMAL HIGH (ref 70–99)
Potassium: 3.5 mmol/L (ref 3.5–5.1)
Sodium: 143 mmol/L (ref 135–145)

## 2017-11-22 MED ORDER — CARVEDILOL 12.5 MG PO TABS
12.5000 mg | ORAL_TABLET | Freq: Two times a day (BID) | ORAL | Status: DC
  Administered 2017-11-22 – 2017-11-23 (×2): 12.5 mg via ORAL
  Filled 2017-11-22 (×2): qty 1
  Filled 2017-11-22 (×2): qty 4
  Filled 2017-11-22: qty 1

## 2017-11-22 MED ORDER — ALBUTEROL SULFATE (2.5 MG/3ML) 0.083% IN NEBU: 2.5000 mg | INHALATION_SOLUTION | RESPIRATORY_TRACT | Status: DC | PRN

## 2017-11-22 MED ORDER — DOXYCYCLINE HYCLATE 100 MG PO TABS
100.0000 mg | ORAL_TABLET | Freq: Two times a day (BID) | ORAL | Status: DC
  Administered 2017-11-22 – 2017-11-23 (×3): 100 mg via ORAL
  Filled 2017-11-22 (×4): qty 1

## 2017-11-22 MED ORDER — HYDRALAZINE HCL 20 MG/ML IJ SOLN: 10.0000 mg | Freq: Four times a day (QID) | INTRAMUSCULAR | Status: DC | PRN

## 2017-11-22 MED ORDER — METHYLPREDNISOLONE SODIUM SUCC 40 MG IJ SOLR
40.0000 mg | Freq: Two times a day (BID) | INTRAMUSCULAR | Status: DC
  Administered 2017-11-22 – 2017-11-23 (×2): 40 mg via INTRAVENOUS
  Filled 2017-11-22 (×2): qty 1

## 2017-11-22 NOTE — Evaluation (Signed)
Physical Therapy Evaluation Patient Details Name: Pamela James MRN: [id number] DOB: 14-Apr-1945 Today's Date: 11/22/2017   History of Present Illness  presented to ER secondary to worsening SOB; admitted with acute COPD exacerbation.  Clinical Impression  Upon evaluation, patient alert and oriented; follows commands and demonstrates good effort with all mobility tasks.  Eager for [place] as tolerated.  Bilat UE/LE strength and ROM grossly symmetrical and WFL (mild weakness L posterior/lateral hip since previous THR, '15).  Able to complete bed mobility with mod indep; sit/stand, basic transfers and gait (80') with RW, cga/close sup.  Good LE strength and overall stability; Min cuing for awareness of signs/symptoms of fatigue and activity pacing; maintains sats > 92% on RA throughout. Do recommend use of RW for all mobility at this time to maximize functional indep and overall activity tolerance; patient voices understanding and agreement. Would benefit from skilled PT to address above deficits and promote optimal return to PLOF; Recommend transition to [place] upon discharge from acute hospitalization.     Follow Up Recommendations Home health PT    Equipment Recommendations  (has all equipment at home)    Recommendations for Other Services       Precautions / Restrictions Precautions Precautions: Fall Restrictions Weight Bearing Restrictions: No      Mobility  Bed Mobility Overal bed mobility: Modified Independent                Transfers Overall transfer level: Needs assistance Equipment used: Rolling walker (2 wheeled) Transfers: Sit to/from Stand Sit to Stand: Supervision;Min guard         General transfer comment: cuing for hand placement  Ambulation/Gait Ambulation/Gait assistance: Min guard;Supervision Gait Distance (Feet): 80 Feet Assistive device: Rolling walker (2 wheeled)       General Gait Details: reciprocal stepping pattern with fair cadence/gait speed; min  cuing to push (vs lift) RW. Min cuing for awareness of signs/symptoms of fatigue and activity pacing; maintains sats > 92% on RA throughout.  Stairs            Wheelchair Mobility    Modified Rankin (Stroke Patients Only)       Balance Overall balance assessment: Needs assistance Sitting-balance support: No upper extremity supported;Feet supported Sitting balance-Leahy Scale: Good     Standing balance support: Bilateral upper extremity supported Standing balance-Leahy Scale: Fair                               Pertinent Vitals/Pain Pain Assessment: Faces Faces Pain Scale: Hurts little more Pain Location: headache Pain Descriptors / Indicators: Headache Pain Intervention(s): Repositioned;Limited activity within patient's tolerance;Monitored during session;Patient requesting pain meds-RN notified    Home Living Family/patient expects to be discharged to:: Private residence Living Arrangements: Children Available Help at Discharge: Family Type of Home: House Home Access: Stairs to enter Entrance Stairs-Rails: Right;Left;Can reach both Entrance Stairs-Number of Steps: 3 Home Layout: One level Home Equipment: [place] - 2 wheels;Cane - single point;Bedside commode      Prior Function Level of Independence: Independent with assistive device(s)         Comments: Mod indep without assist device within the home, with SPC in community; denies home O2; does endorse 2 falls within previous six months.     Hand Dominance        Extremity/Trunk Assessment   Upper Extremity Assessment Upper Extremity Assessment: Overall WFL for tasks assessed    Lower Extremity Assessment Lower Extremity Assessment:  Overall WFL for tasks assessed(grossly at least 4/5 throughout)       Communication   Communication: No difficulties  Cognition Arousal/Alertness: Awake/alert Behavior During Therapy: WFL for tasks assessed/performed Overall Cognitive Status: Within  Functional Limits for tasks assessed                                        General Comments      Exercises Other Exercises Other Exercises: Reviewed importance/role of activity pacing and energy conservation; do recommend use of RW for optimal safety/activity tolerance at this time.  Patient voiced understanding and agreement.   Assessment/Plan    PT Assessment Patient needs continued PT services  PT Problem List Decreased activity tolerance;Decreased balance;Decreased mobility;Cardiopulmonary status limiting activity;Decreased safety awareness;Decreased knowledge of precautions       PT Treatment Interventions DME instruction;Gait training;Stair training;Functional mobility training;Therapeutic activities;Therapeutic exercise;Balance training;Patient/family education    PT Goals (Current goals can be found in the Care Plan section)  Acute Rehab PT Goals Patient Stated Goal: to get up and move around PT Goal Formulation: With patient Time For Goal Achievement: 12/06/17 Potential to Achieve Goals: Good    Frequency Min 2X/week   Barriers to discharge        Co-evaluation               AM-PAC PT "6 Clicks" Daily Activity  Outcome Measure Difficulty turning over in bed (including adjusting bedclothes, sheets and blankets)?: None Difficulty moving from lying on back to sitting on the side of the bed? : None Difficulty sitting down on and standing up from a chair with arms (e.g., wheelchair, bedside commode, etc,.)?: Unable Help needed moving to and from a bed to chair (including a wheelchair)?: A Little Help needed walking in hospital room?: A Little Help needed climbing 3-5 steps with a railing? : A Little 6 Click Score: 18    End of Session Equipment Utilized During Treatment: Gait belt Activity Tolerance: Patient tolerated treatment well Patient left: in chair;with call bell/phone within reach;with chair alarm set Nurse Communication: Mobility  status PT Visit Diagnosis: Difficulty in walking, not elsewhere classified (R26.2);Muscle weakness (generalized) (M62.81)    Time: 5366-4403 PT Time Calculation (min) (ACUTE ONLY): 17 min   Charges:   PT Evaluation $PT Eval Low Complexity: 1 Low PT Treatments $Therapeutic Activity: 8-22 mins       Chai Routh H. Owens Shark, PT, DPT, NCS 11/22/17, 2:30 PM [phone]

## 2017-11-22 NOTE — Progress Notes (Signed)
   11/22/17 1330  Clinical Encounter Type  Visited With Patient  Visit Type Initial;Spiritual support  Referral From Physician  Consult/Referral To Chaplain  Spiritual Encounters  Spiritual Needs Other (Comment)   Ch received an OR to educate the patient on the AD process. The patient was educated and paperwork was left with her. She was instructed to call the [facility] when she was ready to complete.

## 2017-11-22 NOTE — Progress Notes (Signed)
IS and Flutter education complete, pt tol well and understands technique and reason for use. Flutter productive for moderate amount of foamy white secretions. Pt independent with use

## 2017-11-22 NOTE — Progress Notes (Signed)
[place] at [place] NAME: Pamela James    MR#:  [id number]  DATE OF BIRTH:  [birth year]  SUBJECTIVE:   Cough and wheezing have improved as well as shortness of breath  REVIEW OF SYSTEMS:    Review of Systems  Constitutional: Negative for fever, chills weight loss HENT: Negative for ear pain, nosebleeds, congestion, facial swelling, rhinorrhea, neck pain, neck stiffness and ear discharge.   Respiratory: Positive for cough, shortness of breath, wheezing  Cardiovascular: Negative for chest pain, palpitations and leg swelling.  Gastrointestinal: Negative for heartburn, abdominal pain, vomiting, diarrhea or consitpation Genitourinary: Negative for dysuria, urgency, frequency, hematuria Musculoskeletal: Negative for back pain or joint pain Neurological: Negative for dizziness, seizures, syncope, focal weakness,  numbness and headaches.  Hematological: Does not bruise/bleed easily.  Psychiatric/Behavioral: Negative for hallucinations, confusion, dysphoric mood    Tolerating Diet: yes      DRUG ALLERGIES:   Allergies  Allergen Reactions  . Aspirin Other (See Comments)    GI ulcers  . Morphine And Related Other (See Comments)    Delerium, itching  . Nsaids Other (See Comments)    GI ulcers    VITALS:  Blood pressure (!) 193/88, pulse 79, temperature 98.3 F (36.8 C), temperature source Oral, resp. rate 18, height 5\' 3"  (1.6 m), weight 75.8 kg, SpO2 94 %.  PHYSICAL EXAMINATION:  Constitutional: Appears well-developed and well-nourished. No distress. HENT: Normocephalic. Marland Kitchen Oropharynx is clear and moist.  Eyes: Conjunctivae and EOM are normal. PERRLA, no scleral icterus.  Neck: Normal ROM. Neck supple. No JVD. No tracheal deviation. CVS: RRR, S1/S2 +, no murmurs, no gallops, no carotid bruit.  Pulmonary: Normal respiratory effort with bilateral prolonged expiratory wheezing  abdominal: Soft. BS +,  no distension, tenderness, rebound or  guarding.  Musculoskeletal: Normal range of motion. No edema and no tenderness.  Neuro: Alert. CN 2-12 grossly intact. No focal deficits. Skin: Skin is warm and dry. No rash noted. Psychiatric: Normal mood and affect.      LABORATORY PANEL:   CBC Recent Labs  Lab 11/21/17 1322  WBC 7.3  HGB 14.1  HCT 42.3  PLT 505*   ------------------------------------------------------------------------------------------------------------------  Chemistries  Recent Labs  Lab 11/22/17 0334  NA 143  K 3.5  CL 106  CO2 30  GLUCOSE 150*  BUN 19  CREATININE 0.76  CALCIUM 10.1   ------------------------------------------------------------------------------------------------------------------  Cardiac Enzymes Recent Labs  Lab 11/21/17 1322  TROPONINI <0.03   ------------------------------------------------------------------------------------------------------------------  RADIOLOGY:  Dg Chest Portable 1 View  Result Date: 11/21/2017 CLINICAL DATA:  Shortness of breath. EXAM: PORTABLE CHEST 1 VIEW COMPARISON:  None. FINDINGS: The heart size and mediastinal contours are within normal limits. Both lungs are clear. The visualized skeletal structures are unremarkable. IMPRESSION: No active disease. Electronically Signed   By: Dorise Bullion III M.D   On: 11/21/2017 13:58     ASSESSMENT AND PLAN:   72 year old female with history of tobacco dependence and COPD who presents to the emergency room due to shortness of breath and wheezing.  1.  Acute exacerbation of COPD with acute bronchitis: Wean IV steroids Continue nebs and inhalers Doxycycline for acute bronchitis PRN cough syrup  2.Tobacco dependence: Patient is encouraged to quit smoking. Counseling was provided for 4 minutes. Continue nicotine patch  3.  Accelerated essential hypertension: Continue lisinopril, Norvasc and increase Coreg PRN hydralazine Decrease IV steroids which may help with blood pressure   4.   Hyperlipidemia: Continue statin  5  GERD: Continue famotidine  6.  Depression: Continue Lexapro    Management plans discussed with the patient and she is in agreement.  CODE STATUS: full  TOTAL TIME TAKING CARE OF THIS PATIENT: 30 minutes.     POSSIBLE D/C tomorrow, DEPENDING ON CLINICAL CONDITION.   Lorenzo Pereyra M.D on 11/22/2017 at 11:23 AM  Between 7am to 6pm - Pager - [phone] After 6pm go to [email or web address] - password [email or web address] [facility]  Office  [phone]  CC: Primary care physician; Cindra Eves, MD  Note: This dictation was prepared with Dragon dictation along with smaller phrase technology. Any transcriptional errors that result from this process are unintentional.

## 2017-11-22 NOTE — Progress Notes (Signed)
Family Meeting Note  Advance Directive:no  Today a meeting took place with the Patient.   The following clinical team members were present during this meeting:MD  The following were discussed:Patient's diagnosis: Acute exacerbation COPD with acute bronchitis and ongoing tobacco dependence, Patient's progosis: > 12 months and Goals for treatment: Full Code  Additional follow-up to be provided: Continue full CODE STATUS and chaplain consultation to start advanced directives  Time spent during discussion: 16 minutes  Nzinga Ferran, MD

## 2017-11-22 NOTE — Progress Notes (Signed)
Care of patient taken over from [place], [place].  Clarise Cruz, RN, BSN

## 2017-11-23 MED ORDER — CARVEDILOL 12.5 MG PO TABS: 12.5000 mg | ORAL_TABLET | Freq: Two times a day (BID) | ORAL | 0 refills | Status: DC

## 2017-11-23 MED ORDER — PREDNISONE 10 MG PO TABS: 40.0000 mg | ORAL_TABLET | Freq: Every day | ORAL | 0 refills | Status: AC

## 2017-11-23 MED ORDER — NICOTINE 21 MG/24HR TD PT24: 21.0000 mg | MEDICATED_PATCH | Freq: Every day | TRANSDERMAL | 0 refills | Status: DC

## 2017-11-23 MED ORDER — HYDRALAZINE HCL 20 MG/ML IJ SOLN
10.0000 mg | Freq: Four times a day (QID) | INTRAMUSCULAR | Status: DC | PRN
  Administered 2017-11-23: 10 mg via INTRAVENOUS
  Filled 2017-11-23: qty 1

## 2017-11-23 MED ORDER — DOXYCYCLINE HYCLATE 100 MG PO TABS: 100.0000 mg | ORAL_TABLET | Freq: Two times a day (BID) | ORAL | 0 refills | Status: AC

## 2017-11-23 NOTE — Progress Notes (Deleted)
D/C instructions given at bedside. Taken out in a wheelchair to daughter. Kieth Brightly, RN

## 2017-11-23 NOTE — Progress Notes (Signed)
Pt had blood pressure of 179/77 and symptomatic with headache. Provider notified. Hydralazine was given and pressure reassessed to 167/52. Will continue to reassess.

## 2017-11-23 NOTE — Progress Notes (Signed)
Patient discharged. Jahlia Omura S, RN  

## 2017-11-23 NOTE — Progress Notes (Signed)
Discharge instructions given and went over with patient at bedside as well as with son via telephone. All questions answered. Awaiting transportation to discharge home. Madlyn Frankel, RN

## 2017-11-23 NOTE — Care Management Note (Signed)
Case Management Note  Patient Details  Name: Pamela James MRN: [id number] Date of Birth: 06-Dec-1945  Subjective/Objective: Met with patient at bedside to discuss discharge planning. She lives at home with her son. She will need RN and PT. Offered a list of agencies. No preference. [facility]erral to [facility] for [facility] and PT. Patient decvlines a HHA. PCP is Dr. Barnet Pall. No DME needs. Discharging by car today.                  Action/Plan: [facility] for RN and PT  Expected Discharge Date:  11/23/17               Expected Discharge Plan:     In-House Referral:     Discharge planning Services     Post Acute Care Choice:    Choice offered to:  Patient  DME Arranged:    DME Agency:  [place] Arranged:  RN, PT [facility] Agency:  [place]  Status of Service:  Completed, signed off  If discussed at [place] of Stay Meetings, dates discussed:    Additional Comments:  Jolly Mango, RN 11/23/2017, 3:54 PM

## 2017-11-23 NOTE — Discharge Summary (Addendum)
[place] at [place] NAME: Pamela James    MR#:  [id number]  DATE OF BIRTH:  Mar 02, 1946  DATE OF ADMISSION:  11/21/2017 ADMITTING PHYSICIAN: Saundra Shelling, MD  DATE OF DISCHARGE: 11/23/2017  PRIMARY CARE PHYSICIAN: Cindra Eves, MD    ADMISSION DIAGNOSIS:  COPD exacerbation ([place]) [J44.1]  DISCHARGE DIAGNOSIS:  Active Problems:   COPD exacerbation ([place])   SECONDARY DIAGNOSIS:   Past Medical History:  Diagnosis Date  . Anxiety   . Asthma   . COPD (chronic obstructive pulmonary disease) ([place])   . Depression   . GERD (gastroesophageal reflux disease)   . Hypertension     HOSPITAL COURSE:   72 year old female with history of tobacco dependence and COPD who presents to the emergency room due to shortness of breath and wheezing.  1.  Acute exacerbation of COPD with acute bronchitis: Her symptoms have improved.  She continues to have some very mild wheezing and cough.  She is not requiring oxygen upon discharge.  She will be discharged on doxycycline for acute bronchitis and prednisone.  2.Tobacco dependence: Patient is encouraged to quit smoking.  She is highly motivated to stop smoking and wants a nicotine patch upon discharge.  3.  Accelerated essential hypertension: Continue lisinopril, Norvasc and increased dose of Coreg He will need outpatient follow-up for her blood pressure4.  Hyperlipidemia: Continue statin  5  GERD: Continue Zantac  6.  Depression: Continue Lexapro   DISCHARGE CONDITIONS AND DIET:   Stable for discharge Regular diet  CONSULTS OBTAINED:    DRUG ALLERGIES:   Allergies  Allergen Reactions  . Aspirin Other (See Comments)    GI ulcers  . Morphine And Related Other (See Comments)    Delerium, itching  . Nsaids Other (See Comments)    GI ulcers    DISCHARGE MEDICATIONS:   Allergies as of 11/23/2017      Reactions   Aspirin Other (See Comments)   GI ulcers   Morphine And Related Other  (See Comments)   Delerium, itching   Nsaids Other (See Comments)   GI ulcers      Medication List    TAKE these medications   acetaminophen 500 MG tablet Commonly known as:  TYLENOL Take 500-1,000 mg by mouth every 6 (six) hours as needed for moderate pain or fever.   albuterol 108 (90 Base) MCG/ACT inhaler Commonly known as:  PROVENTIL HFA;VENTOLIN HFA Inhale 2 puffs into the lungs every 6 (six) hours as needed for wheezing or shortness of breath.   albuterol 1.25 MG/3ML nebulizer solution Commonly known as:  ACCUNEB Take 1 ampule by nebulization every 6 (six) hours as needed for wheezing or shortness of breath.   amLODipine 10 MG tablet Commonly known as:  NORVASC Take 10 mg by mouth daily.   aspirin 81 MG EC tablet Take 1 tablet (81 mg total) by mouth daily.   carvedilol 12.5 MG tablet Commonly known as:  COREG Take 1 tablet (12.5 mg total) by mouth 2 (two) times daily with a meal. What changed:    medication strength  how much to take  Another medication with the same name was removed. Continue taking this medication, and follow the directions you see here.   doxycycline 100 MG tablet Commonly known as:  VIBRA-TABS Take 1 tablet (100 mg total) by mouth every 12 (twelve) hours for 4 days.   Fluticasone-Salmeterol 250-50 MCG/DOSE Aepb Commonly known as:  ADVAIR Inhale 1 puff into the lungs every  12 (twelve) hours.   ipratropium 17 MCG/ACT inhaler Commonly known as:  ATROVENT HFA Inhale 2 puffs into the lungs 3 (three) times daily.   lisinopril 40 MG tablet Commonly known as:  PRINIVIL,ZESTRIL Take 40 mg by mouth daily.   nicotine 21 mg/24hr patch Commonly known as:  NICODERM CQ - dosed in mg/24 hours Place 1 patch (21 mg total) onto the skin daily.   omeprazole 20 MG capsule Commonly known as:  PRILOSEC Take 20 mg by mouth daily.   pravastatin 20 MG tablet Commonly known as:  PRAVACHOL Take 20 mg by mouth every evening.   predniSONE 10 MG  tablet Commonly known as:  DELTASONE Take 4 tablets (40 mg total) by mouth daily with breakfast for 4 days.   ranitidine 150 MG capsule Commonly known as:  ZANTAC Take 150 mg by mouth 2 (two) times daily.         Today   CHIEF COMPLAINT:  Patient is doing much better this morning.   VITAL SIGNS:  Blood pressure (!) 154/73, pulse 76, temperature 99 F (37.2 C), resp. rate 20, height 5\' 3"  (1.6 m), weight 75.8 kg, SpO2 96 %.   REVIEW OF SYSTEMS:  Review of Systems  Constitutional: Negative.  Negative for chills, fever and malaise/fatigue.  HENT: Negative.  Negative for ear discharge, ear pain, hearing loss, nosebleeds and sore throat.   Eyes: Negative.  Negative for blurred vision and pain.  Respiratory: Positive for cough. Negative for hemoptysis, shortness of breath and wheezing.   Cardiovascular: Negative.  Negative for chest pain, palpitations and leg swelling.  Gastrointestinal: Negative.  Negative for abdominal pain, blood in stool, diarrhea, nausea and vomiting.  Genitourinary: Negative.  Negative for dysuria.  Musculoskeletal: Negative.  Negative for back pain.  Skin: Negative.   Neurological: Negative for dizziness, tremors, speech change, focal weakness, seizures and headaches.  Endo/Heme/Allergies: Negative.  Does not bruise/bleed easily.  Psychiatric/Behavioral: Negative.  Negative for depression, hallucinations and suicidal ideas.     PHYSICAL EXAMINATION:  GENERAL:  72 y.o.-year-old patient lying in the bed with no acute distress.  NECK:  Supple, no jugular venous distention. No thyroid enlargement, no tenderness.  LUNGS: Normal breath sounds bilaterally, no wheezing, rales,rhonchi  No use of accessory muscles of respiration.  CARDIOVASCULAR: S1, S2 normal. No murmurs, rubs, or gallops.  ABDOMEN: Soft, non-tender, non-distended. Bowel sounds present. No organomegaly or mass.  EXTREMITIES: No pedal edema, cyanosis, or clubbing.  PSYCHIATRIC: The patient is  alert and oriented x 3.  SKIN: No obvious rash, lesion, or ulcer.   DATA REVIEW:   CBC Recent Labs  Lab 11/21/17 1322  WBC 7.3  HGB 14.1  HCT 42.3  PLT 505*    Chemistries  Recent Labs  Lab 11/22/17 0334  NA 143  K 3.5  CL 106  CO2 30  GLUCOSE 150*  BUN 19  CREATININE 0.76  CALCIUM 10.1    Cardiac Enzymes Recent Labs  Lab 11/21/17 1322  TROPONINI <0.03    Microbiology Results  @MICRORSLT48 @  RADIOLOGY:  No results found.    Allergies as of 11/23/2017      Reactions   Aspirin Other (See Comments)   GI ulcers   Morphine And Related Other (See Comments)   Delerium, itching   Nsaids Other (See Comments)   GI ulcers      Medication List    TAKE these medications   acetaminophen 500 MG tablet Commonly known as:  TYLENOL Take 500-1,000 mg by mouth every  6 (six) hours as needed for moderate pain or fever.   albuterol 108 (90 Base) MCG/ACT inhaler Commonly known as:  PROVENTIL HFA;VENTOLIN HFA Inhale 2 puffs into the lungs every 6 (six) hours as needed for wheezing or shortness of breath.   albuterol 1.25 MG/3ML nebulizer solution Commonly known as:  ACCUNEB Take 1 ampule by nebulization every 6 (six) hours as needed for wheezing or shortness of breath.   amLODipine 10 MG tablet Commonly known as:  NORVASC Take 10 mg by mouth daily.   aspirin 81 MG EC tablet Take 1 tablet (81 mg total) by mouth daily.   carvedilol 12.5 MG tablet Commonly known as:  COREG Take 1 tablet (12.5 mg total) by mouth 2 (two) times daily with a meal. What changed:    medication strength  how much to take  Another medication with the same name was removed. Continue taking this medication, and follow the directions you see here.   doxycycline 100 MG tablet Commonly known as:  VIBRA-TABS Take 1 tablet (100 mg total) by mouth every 12 (twelve) hours for 4 days.   Fluticasone-Salmeterol 250-50 MCG/DOSE Aepb Commonly known as:  ADVAIR Inhale 1 puff into the lungs  every 12 (twelve) hours.   ipratropium 17 MCG/ACT inhaler Commonly known as:  ATROVENT HFA Inhale 2 puffs into the lungs 3 (three) times daily.   lisinopril 40 MG tablet Commonly known as:  PRINIVIL,ZESTRIL Take 40 mg by mouth daily.   nicotine 21 mg/24hr patch Commonly known as:  NICODERM CQ - dosed in mg/24 hours Place 1 patch (21 mg total) onto the skin daily.   omeprazole 20 MG capsule Commonly known as:  PRILOSEC Take 20 mg by mouth daily.   pravastatin 20 MG tablet Commonly known as:  PRAVACHOL Take 20 mg by mouth every evening.   predniSONE 10 MG tablet Commonly known as:  DELTASONE Take 4 tablets (40 mg total) by mouth daily with breakfast for 4 days.   ranitidine 150 MG capsule Commonly known as:  ZANTAC Take 150 mg by mouth 2 (two) times daily.         Management plans discussed with the patient and she is in agreement. Stable for discharge home  Patient should follow up with pcp  CODE STATUS:     Code Status Orders  (From admission, onward)         Start     Ordered   11/21/17 1552  Full code  Continuous     11/21/17 1551        Code Status History    Date Active Date Inactive Code Status Order ID Comments User Context   03/25/2016 0318 03/26/2016 2014 Full Code [id number]  Saundra Shelling, MD ED      TOTAL TIME TAKING CARE OF THIS PATIENT: 38 minutes.    Note: This dictation was prepared with Dragon dictation along with smaller phrase technology. Any transcriptional errors that result from this process are unintentional.  Gennie Eisinger M.D on 11/23/2017 at 6:09 PM  Between 7am to 6pm - Pager - [phone] After 6pm go to [email or web address] - password [email or web address] [facility]  Office  [phone]  CC: Primary care physician; Cindra Eves, MD

## 2017-11-26 ENCOUNTER — Other Ambulatory Visit: Payer: Self-pay | Admitting: *Deleted

## 2017-11-26 DIAGNOSIS — Z96642 Presence of left artificial hip joint: Secondary | ICD-10-CM | POA: Diagnosis not present

## 2017-11-26 DIAGNOSIS — J440 Chronic obstructive pulmonary disease with acute lower respiratory infection: Secondary | ICD-10-CM | POA: Diagnosis not present

## 2017-11-26 DIAGNOSIS — F329 Major depressive disorder, single episode, unspecified: Secondary | ICD-10-CM | POA: Diagnosis not present

## 2017-11-26 DIAGNOSIS — J45909 Unspecified asthma, uncomplicated: Secondary | ICD-10-CM | POA: Diagnosis not present

## 2017-11-26 DIAGNOSIS — I10 Essential (primary) hypertension: Secondary | ICD-10-CM | POA: Diagnosis not present

## 2017-11-26 DIAGNOSIS — F419 Anxiety disorder, unspecified: Secondary | ICD-10-CM | POA: Diagnosis not present

## 2017-11-26 DIAGNOSIS — J441 Chronic obstructive pulmonary disease with (acute) exacerbation: Secondary | ICD-10-CM | POA: Diagnosis not present

## 2017-11-26 DIAGNOSIS — F17210 Nicotine dependence, cigarettes, uncomplicated: Secondary | ICD-10-CM | POA: Diagnosis not present

## 2017-11-26 DIAGNOSIS — K219 Gastro-esophageal reflux disease without esophagitis: Secondary | ICD-10-CM | POA: Diagnosis not present

## 2017-11-26 NOTE — Patient Outreach (Addendum)
[facility]) Care Management  11/26/2017  Pamela James Sep 21, 1945 [id number]   EMMI-general discharge- [facility] after admission 11/21/17 -11/23/17   RED ON EMMI ALERT Day # 1 Date: 11/25/17 1152 Red Alert Reason: taking meds? No  [employer] medicare HMO and medicaid [place] access   Patient returned a call to [place] Patient is able to verify HIPAA Reviewed and addressed referral to [facility] with patient She confirms the answer recorded taking meds? No was incorrect  She confirms she is taking her "antibiotics and steroid" CM and Ms Statzer reviewed together her discharge instruction for her diet, activity level, follow up appointments She discussed the use of her "breathing machine" and bp cuff that "speaks to me"  She reports having a "knot on hip" CM encouraged a visit to her orthopedic provider and for her to talk with her [facility] PT on 11/27/17 about this area She reports use of regular tylenol for pain with relief    Social: Ms Biggers states she lives with her son, Seth Bake (her payee), his wife, grandchildren and their dog She reports she has stopped smoking  She confirms she was seen today 11/26/17 by advanced home care RN to help review and fill her medications and will be seen on 11/27/17 by he [facility] PT, Caryl Pina She reports good support and lots of visitors She has been walking with her grand daughter using her cane She reports checking her BP daily, recording it Today she reports a BP of 155/72 and pulse of 68    Conditions: osteoarthritis of left hip, thyroid nodule, vitamin D deficiency, aortic heart murmur, hyperlipidemia, anxiety, mild persistent asthma, former smoker, left hip surgery 11/30/ 2015 pain of hip, HTN, CPD, chest pain, hx of colon polyp, hyperparathyroidism, bipolar affective disorder, borderline schizophrenia, hx of peptic ulcer  Medications: She confirms she was seen today 11/26/17 by advanced home care RN to help review and fill her medications she reports her son  is to get her setup with a local pharmacy delivery services. She  denies concerns with taking medications as prescribed, affording medications, side effects of medications and questions about medications She reports being recently informed about [employer] OTC benefit and she will start using the benefit  She reports her smoke cessation patch and gum will be arriving soon    Appointments: She informs CM she is to make her appt with Dr Barnet Pall, primary MD on Monday 11/29/17 after she completes the remaining 5 days of antibiotics and steroids  pulmonary rehab referral sent to [facility] cardiac/pulmonary pending. Reviewed d/c papers to indicate the number to call to f/u on status of the referral She prefers to go in [facility] vs  or [facility] facility She states she has been assisted to have a psychiatry- Dr Keturah Barre Malen Gauze visit soon after it was recommended  Other providers- Orthopedic, Dr Deliah Goody, leonor corsino- endocrinologist, emily pratt- optometry   Advance Directives: Denies need for assist with or assist with changes for advance directives but wants a copy of the advance directives package/form sent to her and she will later decide if she needs to speak with a [facility] SW about them   Consent: [facility] RN CM reviewed [facility] services with patient. Patient gave verbal consent for services.   Plan [facility] RN CM will close case at this time as patient has been assessed and no needs identified.   [facility] RN CM sent a successful outreach letter as discussed with the advance directive package/forms enclosed for review   Ben Habermann L.  Lavina Hamman, RN, BSN, [place] Coordinator Office number [phone] Mobile number [phone]  Main [facility] number [phone] Fax number [phone]

## 2017-11-26 NOTE — Patient Outreach (Addendum)
[facility]) Care Management  11/26/2017  Montie Gelardi 1945-09-03 [id number]   EMMI-general discharge- [facility] after admission 11/21/17 -11/23/17   RED ON EMMI ALERT Day # 1 Date: 11/25/17 1152 Red Alert Reason: taking meds? No  [employer] medicare HMO and medicaid [employer] attempt # 1 Unsuccessful outreach  No answer. [facility] RN CM left HIPAA compliant voicemail message along with CM's contact info.   Plan: [facility] RN CM sent an unsuccessful outreach letter and scheduled this patient for another call attempt within 4 business days  Hyun Marsalis L. Lavina Hamman, RN, BSN, [place] Coordinator Office number [phone] Mobile number [phone]  Main [facility] number [phone] Fax number [phone]

## 2017-11-29 ENCOUNTER — Ambulatory Visit: Payer: Self-pay | Admitting: *Deleted

## 2017-11-30 DIAGNOSIS — K219 Gastro-esophageal reflux disease without esophagitis: Secondary | ICD-10-CM | POA: Diagnosis not present

## 2017-11-30 DIAGNOSIS — F329 Major depressive disorder, single episode, unspecified: Secondary | ICD-10-CM | POA: Diagnosis not present

## 2017-11-30 DIAGNOSIS — J440 Chronic obstructive pulmonary disease with acute lower respiratory infection: Secondary | ICD-10-CM | POA: Diagnosis not present

## 2017-11-30 DIAGNOSIS — J45909 Unspecified asthma, uncomplicated: Secondary | ICD-10-CM | POA: Diagnosis not present

## 2017-11-30 DIAGNOSIS — Z96642 Presence of left artificial hip joint: Secondary | ICD-10-CM | POA: Diagnosis not present

## 2017-11-30 DIAGNOSIS — J441 Chronic obstructive pulmonary disease with (acute) exacerbation: Secondary | ICD-10-CM | POA: Diagnosis not present

## 2017-11-30 DIAGNOSIS — F17210 Nicotine dependence, cigarettes, uncomplicated: Secondary | ICD-10-CM | POA: Diagnosis not present

## 2017-11-30 DIAGNOSIS — F419 Anxiety disorder, unspecified: Secondary | ICD-10-CM | POA: Diagnosis not present

## 2017-11-30 DIAGNOSIS — I10 Essential (primary) hypertension: Secondary | ICD-10-CM | POA: Diagnosis not present

## 2017-12-02 DIAGNOSIS — J45909 Unspecified asthma, uncomplicated: Secondary | ICD-10-CM | POA: Diagnosis not present

## 2017-12-02 DIAGNOSIS — I10 Essential (primary) hypertension: Secondary | ICD-10-CM | POA: Diagnosis not present

## 2017-12-02 DIAGNOSIS — J440 Chronic obstructive pulmonary disease with acute lower respiratory infection: Secondary | ICD-10-CM | POA: Diagnosis not present

## 2017-12-02 DIAGNOSIS — K219 Gastro-esophageal reflux disease without esophagitis: Secondary | ICD-10-CM | POA: Diagnosis not present

## 2017-12-02 DIAGNOSIS — F419 Anxiety disorder, unspecified: Secondary | ICD-10-CM | POA: Diagnosis not present

## 2017-12-02 DIAGNOSIS — F329 Major depressive disorder, single episode, unspecified: Secondary | ICD-10-CM | POA: Diagnosis not present

## 2017-12-02 DIAGNOSIS — F17210 Nicotine dependence, cigarettes, uncomplicated: Secondary | ICD-10-CM | POA: Diagnosis not present

## 2017-12-02 DIAGNOSIS — Z96642 Presence of left artificial hip joint: Secondary | ICD-10-CM | POA: Diagnosis not present

## 2017-12-02 DIAGNOSIS — J441 Chronic obstructive pulmonary disease with (acute) exacerbation: Secondary | ICD-10-CM | POA: Diagnosis not present

## 2017-12-03 DIAGNOSIS — F4321 Adjustment disorder with depressed mood: Secondary | ICD-10-CM | POA: Diagnosis not present

## 2017-12-06 DIAGNOSIS — Z96642 Presence of left artificial hip joint: Secondary | ICD-10-CM | POA: Diagnosis not present

## 2017-12-06 DIAGNOSIS — K219 Gastro-esophageal reflux disease without esophagitis: Secondary | ICD-10-CM | POA: Diagnosis not present

## 2017-12-06 DIAGNOSIS — J441 Chronic obstructive pulmonary disease with (acute) exacerbation: Secondary | ICD-10-CM | POA: Diagnosis not present

## 2017-12-06 DIAGNOSIS — J45909 Unspecified asthma, uncomplicated: Secondary | ICD-10-CM | POA: Diagnosis not present

## 2017-12-06 DIAGNOSIS — I10 Essential (primary) hypertension: Secondary | ICD-10-CM | POA: Diagnosis not present

## 2017-12-06 DIAGNOSIS — F419 Anxiety disorder, unspecified: Secondary | ICD-10-CM | POA: Diagnosis not present

## 2017-12-06 DIAGNOSIS — F329 Major depressive disorder, single episode, unspecified: Secondary | ICD-10-CM | POA: Diagnosis not present

## 2017-12-06 DIAGNOSIS — F17210 Nicotine dependence, cigarettes, uncomplicated: Secondary | ICD-10-CM | POA: Diagnosis not present

## 2017-12-06 DIAGNOSIS — J440 Chronic obstructive pulmonary disease with acute lower respiratory infection: Secondary | ICD-10-CM | POA: Diagnosis not present

## 2017-12-07 DIAGNOSIS — Z96642 Presence of left artificial hip joint: Secondary | ICD-10-CM | POA: Diagnosis not present

## 2017-12-07 DIAGNOSIS — J441 Chronic obstructive pulmonary disease with (acute) exacerbation: Secondary | ICD-10-CM | POA: Diagnosis not present

## 2017-12-07 DIAGNOSIS — I10 Essential (primary) hypertension: Secondary | ICD-10-CM | POA: Diagnosis not present

## 2017-12-07 DIAGNOSIS — F329 Major depressive disorder, single episode, unspecified: Secondary | ICD-10-CM | POA: Diagnosis not present

## 2017-12-07 DIAGNOSIS — F419 Anxiety disorder, unspecified: Secondary | ICD-10-CM | POA: Diagnosis not present

## 2017-12-07 DIAGNOSIS — J45909 Unspecified asthma, uncomplicated: Secondary | ICD-10-CM | POA: Diagnosis not present

## 2017-12-07 DIAGNOSIS — F17210 Nicotine dependence, cigarettes, uncomplicated: Secondary | ICD-10-CM | POA: Diagnosis not present

## 2017-12-07 DIAGNOSIS — K219 Gastro-esophageal reflux disease without esophagitis: Secondary | ICD-10-CM | POA: Diagnosis not present

## 2017-12-07 DIAGNOSIS — J440 Chronic obstructive pulmonary disease with acute lower respiratory infection: Secondary | ICD-10-CM | POA: Diagnosis not present

## 2017-12-09 DIAGNOSIS — Z96642 Presence of left artificial hip joint: Secondary | ICD-10-CM | POA: Diagnosis not present

## 2017-12-09 DIAGNOSIS — F329 Major depressive disorder, single episode, unspecified: Secondary | ICD-10-CM | POA: Diagnosis not present

## 2017-12-09 DIAGNOSIS — I10 Essential (primary) hypertension: Secondary | ICD-10-CM | POA: Diagnosis not present

## 2017-12-09 DIAGNOSIS — J441 Chronic obstructive pulmonary disease with (acute) exacerbation: Secondary | ICD-10-CM | POA: Diagnosis not present

## 2017-12-09 DIAGNOSIS — F17210 Nicotine dependence, cigarettes, uncomplicated: Secondary | ICD-10-CM | POA: Diagnosis not present

## 2017-12-09 DIAGNOSIS — J45909 Unspecified asthma, uncomplicated: Secondary | ICD-10-CM | POA: Diagnosis not present

## 2017-12-09 DIAGNOSIS — K219 Gastro-esophageal reflux disease without esophagitis: Secondary | ICD-10-CM | POA: Diagnosis not present

## 2017-12-09 DIAGNOSIS — J440 Chronic obstructive pulmonary disease with acute lower respiratory infection: Secondary | ICD-10-CM | POA: Diagnosis not present

## 2017-12-09 DIAGNOSIS — F419 Anxiety disorder, unspecified: Secondary | ICD-10-CM | POA: Diagnosis not present

## 2017-12-10 DIAGNOSIS — J441 Chronic obstructive pulmonary disease with (acute) exacerbation: Secondary | ICD-10-CM | POA: Diagnosis not present

## 2017-12-10 DIAGNOSIS — F329 Major depressive disorder, single episode, unspecified: Secondary | ICD-10-CM | POA: Diagnosis not present

## 2017-12-10 DIAGNOSIS — J45909 Unspecified asthma, uncomplicated: Secondary | ICD-10-CM | POA: Diagnosis not present

## 2017-12-10 DIAGNOSIS — I10 Essential (primary) hypertension: Secondary | ICD-10-CM | POA: Diagnosis not present

## 2017-12-10 DIAGNOSIS — Z96642 Presence of left artificial hip joint: Secondary | ICD-10-CM | POA: Diagnosis not present

## 2017-12-10 DIAGNOSIS — F17210 Nicotine dependence, cigarettes, uncomplicated: Secondary | ICD-10-CM | POA: Diagnosis not present

## 2017-12-10 DIAGNOSIS — J440 Chronic obstructive pulmonary disease with acute lower respiratory infection: Secondary | ICD-10-CM | POA: Diagnosis not present

## 2017-12-10 DIAGNOSIS — F419 Anxiety disorder, unspecified: Secondary | ICD-10-CM | POA: Diagnosis not present

## 2017-12-10 DIAGNOSIS — K219 Gastro-esophageal reflux disease without esophagitis: Secondary | ICD-10-CM | POA: Diagnosis not present

## 2017-12-15 DIAGNOSIS — F329 Major depressive disorder, single episode, unspecified: Secondary | ICD-10-CM | POA: Diagnosis not present

## 2017-12-15 DIAGNOSIS — Z96642 Presence of left artificial hip joint: Secondary | ICD-10-CM | POA: Diagnosis not present

## 2017-12-15 DIAGNOSIS — F419 Anxiety disorder, unspecified: Secondary | ICD-10-CM | POA: Diagnosis not present

## 2017-12-15 DIAGNOSIS — I10 Essential (primary) hypertension: Secondary | ICD-10-CM | POA: Diagnosis not present

## 2017-12-15 DIAGNOSIS — K219 Gastro-esophageal reflux disease without esophagitis: Secondary | ICD-10-CM | POA: Diagnosis not present

## 2017-12-15 DIAGNOSIS — J440 Chronic obstructive pulmonary disease with acute lower respiratory infection: Secondary | ICD-10-CM | POA: Diagnosis not present

## 2017-12-15 DIAGNOSIS — F17210 Nicotine dependence, cigarettes, uncomplicated: Secondary | ICD-10-CM | POA: Diagnosis not present

## 2017-12-15 DIAGNOSIS — J441 Chronic obstructive pulmonary disease with (acute) exacerbation: Secondary | ICD-10-CM | POA: Diagnosis not present

## 2017-12-15 DIAGNOSIS — J45909 Unspecified asthma, uncomplicated: Secondary | ICD-10-CM | POA: Diagnosis not present

## 2017-12-16 DIAGNOSIS — I10 Essential (primary) hypertension: Secondary | ICD-10-CM | POA: Diagnosis not present

## 2017-12-16 DIAGNOSIS — J440 Chronic obstructive pulmonary disease with acute lower respiratory infection: Secondary | ICD-10-CM | POA: Diagnosis not present

## 2017-12-16 DIAGNOSIS — J441 Chronic obstructive pulmonary disease with (acute) exacerbation: Secondary | ICD-10-CM | POA: Diagnosis not present

## 2017-12-16 DIAGNOSIS — F329 Major depressive disorder, single episode, unspecified: Secondary | ICD-10-CM | POA: Diagnosis not present

## 2017-12-16 DIAGNOSIS — K219 Gastro-esophageal reflux disease without esophagitis: Secondary | ICD-10-CM | POA: Diagnosis not present

## 2017-12-16 DIAGNOSIS — F17210 Nicotine dependence, cigarettes, uncomplicated: Secondary | ICD-10-CM | POA: Diagnosis not present

## 2017-12-16 DIAGNOSIS — Z96642 Presence of left artificial hip joint: Secondary | ICD-10-CM | POA: Diagnosis not present

## 2017-12-16 DIAGNOSIS — F419 Anxiety disorder, unspecified: Secondary | ICD-10-CM | POA: Diagnosis not present

## 2017-12-16 DIAGNOSIS — J45909 Unspecified asthma, uncomplicated: Secondary | ICD-10-CM | POA: Diagnosis not present

## 2017-12-17 DIAGNOSIS — J45909 Unspecified asthma, uncomplicated: Secondary | ICD-10-CM | POA: Diagnosis not present

## 2017-12-17 DIAGNOSIS — F17210 Nicotine dependence, cigarettes, uncomplicated: Secondary | ICD-10-CM | POA: Diagnosis not present

## 2017-12-17 DIAGNOSIS — K219 Gastro-esophageal reflux disease without esophagitis: Secondary | ICD-10-CM | POA: Diagnosis not present

## 2017-12-17 DIAGNOSIS — Z96642 Presence of left artificial hip joint: Secondary | ICD-10-CM | POA: Diagnosis not present

## 2017-12-17 DIAGNOSIS — J440 Chronic obstructive pulmonary disease with acute lower respiratory infection: Secondary | ICD-10-CM | POA: Diagnosis not present

## 2017-12-17 DIAGNOSIS — J441 Chronic obstructive pulmonary disease with (acute) exacerbation: Secondary | ICD-10-CM | POA: Diagnosis not present

## 2017-12-17 DIAGNOSIS — I10 Essential (primary) hypertension: Secondary | ICD-10-CM | POA: Diagnosis not present

## 2017-12-17 DIAGNOSIS — F419 Anxiety disorder, unspecified: Secondary | ICD-10-CM | POA: Diagnosis not present

## 2017-12-17 DIAGNOSIS — F329 Major depressive disorder, single episode, unspecified: Secondary | ICD-10-CM | POA: Diagnosis not present

## 2017-12-20 DIAGNOSIS — K219 Gastro-esophageal reflux disease without esophagitis: Secondary | ICD-10-CM | POA: Diagnosis not present

## 2017-12-20 DIAGNOSIS — J45909 Unspecified asthma, uncomplicated: Secondary | ICD-10-CM | POA: Diagnosis not present

## 2017-12-20 DIAGNOSIS — F329 Major depressive disorder, single episode, unspecified: Secondary | ICD-10-CM | POA: Diagnosis not present

## 2017-12-20 DIAGNOSIS — Z96642 Presence of left artificial hip joint: Secondary | ICD-10-CM | POA: Diagnosis not present

## 2017-12-20 DIAGNOSIS — I10 Essential (primary) hypertension: Secondary | ICD-10-CM | POA: Diagnosis not present

## 2017-12-20 DIAGNOSIS — F419 Anxiety disorder, unspecified: Secondary | ICD-10-CM | POA: Diagnosis not present

## 2017-12-20 DIAGNOSIS — J440 Chronic obstructive pulmonary disease with acute lower respiratory infection: Secondary | ICD-10-CM | POA: Diagnosis not present

## 2017-12-20 DIAGNOSIS — F17210 Nicotine dependence, cigarettes, uncomplicated: Secondary | ICD-10-CM | POA: Diagnosis not present

## 2017-12-20 DIAGNOSIS — J441 Chronic obstructive pulmonary disease with (acute) exacerbation: Secondary | ICD-10-CM | POA: Diagnosis not present

## 2017-12-21 DIAGNOSIS — Z1231 Encounter for screening mammogram for malignant neoplasm of breast: Secondary | ICD-10-CM | POA: Diagnosis not present

## 2017-12-21 DIAGNOSIS — R739 Hyperglycemia, unspecified: Secondary | ICD-10-CM | POA: Diagnosis not present

## 2017-12-21 DIAGNOSIS — E7800 Pure hypercholesterolemia, unspecified: Secondary | ICD-10-CM | POA: Diagnosis not present

## 2017-12-21 DIAGNOSIS — E559 Vitamin D deficiency, unspecified: Secondary | ICD-10-CM | POA: Diagnosis not present

## 2017-12-21 DIAGNOSIS — Z23 Encounter for immunization: Secondary | ICD-10-CM | POA: Diagnosis not present

## 2017-12-21 DIAGNOSIS — K635 Polyp of colon: Secondary | ICD-10-CM | POA: Diagnosis not present

## 2017-12-21 DIAGNOSIS — J449 Chronic obstructive pulmonary disease, unspecified: Secondary | ICD-10-CM | POA: Diagnosis not present

## 2017-12-21 DIAGNOSIS — E875 Hyperkalemia: Secondary | ICD-10-CM | POA: Diagnosis not present

## 2017-12-21 DIAGNOSIS — I10 Essential (primary) hypertension: Secondary | ICD-10-CM | POA: Diagnosis not present

## 2017-12-22 DIAGNOSIS — J441 Chronic obstructive pulmonary disease with (acute) exacerbation: Secondary | ICD-10-CM | POA: Diagnosis not present

## 2017-12-22 DIAGNOSIS — Z96642 Presence of left artificial hip joint: Secondary | ICD-10-CM | POA: Diagnosis not present

## 2017-12-22 DIAGNOSIS — F329 Major depressive disorder, single episode, unspecified: Secondary | ICD-10-CM | POA: Diagnosis not present

## 2017-12-22 DIAGNOSIS — I10 Essential (primary) hypertension: Secondary | ICD-10-CM | POA: Diagnosis not present

## 2017-12-22 DIAGNOSIS — J440 Chronic obstructive pulmonary disease with acute lower respiratory infection: Secondary | ICD-10-CM | POA: Diagnosis not present

## 2017-12-22 DIAGNOSIS — K219 Gastro-esophageal reflux disease without esophagitis: Secondary | ICD-10-CM | POA: Diagnosis not present

## 2017-12-22 DIAGNOSIS — J45909 Unspecified asthma, uncomplicated: Secondary | ICD-10-CM | POA: Diagnosis not present

## 2017-12-22 DIAGNOSIS — F419 Anxiety disorder, unspecified: Secondary | ICD-10-CM | POA: Diagnosis not present

## 2017-12-22 DIAGNOSIS — F17210 Nicotine dependence, cigarettes, uncomplicated: Secondary | ICD-10-CM | POA: Diagnosis not present

## 2017-12-23 DIAGNOSIS — F419 Anxiety disorder, unspecified: Secondary | ICD-10-CM | POA: Diagnosis not present

## 2017-12-23 DIAGNOSIS — F329 Major depressive disorder, single episode, unspecified: Secondary | ICD-10-CM | POA: Diagnosis not present

## 2017-12-23 DIAGNOSIS — F17210 Nicotine dependence, cigarettes, uncomplicated: Secondary | ICD-10-CM | POA: Diagnosis not present

## 2017-12-23 DIAGNOSIS — J441 Chronic obstructive pulmonary disease with (acute) exacerbation: Secondary | ICD-10-CM | POA: Diagnosis not present

## 2017-12-23 DIAGNOSIS — J440 Chronic obstructive pulmonary disease with acute lower respiratory infection: Secondary | ICD-10-CM | POA: Diagnosis not present

## 2017-12-23 DIAGNOSIS — J45909 Unspecified asthma, uncomplicated: Secondary | ICD-10-CM | POA: Diagnosis not present

## 2017-12-23 DIAGNOSIS — K219 Gastro-esophageal reflux disease without esophagitis: Secondary | ICD-10-CM | POA: Diagnosis not present

## 2017-12-23 DIAGNOSIS — Z96642 Presence of left artificial hip joint: Secondary | ICD-10-CM | POA: Diagnosis not present

## 2017-12-23 DIAGNOSIS — I10 Essential (primary) hypertension: Secondary | ICD-10-CM | POA: Diagnosis not present

## 2018-01-03 DIAGNOSIS — D472 Monoclonal gammopathy: Secondary | ICD-10-CM | POA: Diagnosis not present

## 2018-01-03 DIAGNOSIS — R809 Proteinuria, unspecified: Secondary | ICD-10-CM | POA: Diagnosis not present

## 2018-01-03 DIAGNOSIS — R768 Other specified abnormal immunological findings in serum: Secondary | ICD-10-CM | POA: Diagnosis not present

## 2018-01-12 DIAGNOSIS — Z1231 Encounter for screening mammogram for malignant neoplasm of breast: Secondary | ICD-10-CM | POA: Diagnosis not present

## 2018-01-18 DIAGNOSIS — R7303 Prediabetes: Secondary | ICD-10-CM | POA: Diagnosis not present

## 2018-01-18 DIAGNOSIS — D473 Essential (hemorrhagic) thrombocythemia: Secondary | ICD-10-CM | POA: Diagnosis not present

## 2018-01-18 DIAGNOSIS — K635 Polyp of colon: Secondary | ICD-10-CM | POA: Diagnosis not present

## 2018-01-18 DIAGNOSIS — E559 Vitamin D deficiency, unspecified: Secondary | ICD-10-CM | POA: Diagnosis not present

## 2018-01-18 DIAGNOSIS — I10 Essential (primary) hypertension: Secondary | ICD-10-CM | POA: Diagnosis not present

## 2018-02-23 DIAGNOSIS — Z96642 Presence of left artificial hip joint: Secondary | ICD-10-CM | POA: Diagnosis not present

## 2018-02-23 DIAGNOSIS — J45909 Unspecified asthma, uncomplicated: Secondary | ICD-10-CM | POA: Diagnosis not present

## 2018-02-23 DIAGNOSIS — D125 Benign neoplasm of sigmoid colon: Secondary | ICD-10-CM | POA: Diagnosis not present

## 2018-02-23 DIAGNOSIS — K635 Polyp of colon: Secondary | ICD-10-CM | POA: Diagnosis not present

## 2018-02-23 DIAGNOSIS — I10 Essential (primary) hypertension: Secondary | ICD-10-CM | POA: Diagnosis not present

## 2018-02-23 DIAGNOSIS — F319 Bipolar disorder, unspecified: Secondary | ICD-10-CM | POA: Diagnosis not present

## 2018-02-23 DIAGNOSIS — F419 Anxiety disorder, unspecified: Secondary | ICD-10-CM | POA: Diagnosis not present

## 2018-02-23 DIAGNOSIS — G4700 Insomnia, unspecified: Secondary | ICD-10-CM | POA: Diagnosis not present

## 2018-02-23 DIAGNOSIS — K648 Other hemorrhoids: Secondary | ICD-10-CM | POA: Diagnosis not present

## 2018-02-23 DIAGNOSIS — Z1211 Encounter for screening for malignant neoplasm of colon: Secondary | ICD-10-CM | POA: Diagnosis not present

## 2018-02-23 DIAGNOSIS — Z86010 Personal history of colonic polyps: Secondary | ICD-10-CM | POA: Diagnosis not present

## 2018-04-20 DIAGNOSIS — F319 Bipolar disorder, unspecified: Secondary | ICD-10-CM | POA: Diagnosis not present

## 2018-04-20 DIAGNOSIS — E278 Other specified disorders of adrenal gland: Secondary | ICD-10-CM | POA: Diagnosis not present

## 2018-04-20 DIAGNOSIS — E213 Hyperparathyroidism, unspecified: Secondary | ICD-10-CM | POA: Diagnosis not present

## 2018-04-20 DIAGNOSIS — I10 Essential (primary) hypertension: Secondary | ICD-10-CM | POA: Diagnosis not present

## 2018-04-20 DIAGNOSIS — E7800 Pure hypercholesterolemia, unspecified: Secondary | ICD-10-CM | POA: Diagnosis not present

## 2018-04-20 DIAGNOSIS — R7303 Prediabetes: Secondary | ICD-10-CM | POA: Diagnosis not present

## 2018-04-20 DIAGNOSIS — D472 Monoclonal gammopathy: Secondary | ICD-10-CM | POA: Diagnosis not present

## 2018-04-20 DIAGNOSIS — E559 Vitamin D deficiency, unspecified: Secondary | ICD-10-CM | POA: Diagnosis not present

## 2018-06-02 DIAGNOSIS — J449 Chronic obstructive pulmonary disease, unspecified: Secondary | ICD-10-CM | POA: Diagnosis not present

## 2018-07-03 DIAGNOSIS — J449 Chronic obstructive pulmonary disease, unspecified: Secondary | ICD-10-CM | POA: Diagnosis not present

## 2019-07-25 ENCOUNTER — Other Ambulatory Visit: Payer: Self-pay | Admitting: Internal Medicine

## 2019-07-25 DIAGNOSIS — M1990 Unspecified osteoarthritis, unspecified site: Secondary | ICD-10-CM

## 2019-07-25 DIAGNOSIS — I739 Peripheral vascular disease, unspecified: Secondary | ICD-10-CM

## 2019-07-25 DIAGNOSIS — Z1231 Encounter for screening mammogram for malignant neoplasm of breast: Secondary | ICD-10-CM

## 2019-08-28 ENCOUNTER — Telehealth: Payer: Self-pay | Admitting: *Deleted

## 2019-08-28 DIAGNOSIS — Z122 Encounter for screening for malignant neoplasm of respiratory organs: Secondary | ICD-10-CM

## 2019-08-28 DIAGNOSIS — Z87891 Personal history of nicotine dependence: Secondary | ICD-10-CM

## 2019-08-28 NOTE — Telephone Encounter (Signed)
Received referral for initial lung cancer screening scan. Contacted patient and obtained smoking history,(current, 30 pack year) as well as answering questions related to screening process. Patient denies signs of lung cancer such as weight loss or hemoptysis. Patient denies comorbidity that would prevent curative treatment if lung cancer were found. Patient is scheduled for shared decision making visit and CT scan on 09/12/19 at 115pm.

## 2019-09-11 NOTE — Telephone Encounter (Signed)
Son reports patient is having surgery tomorrow. Will contact at a later date to reschedule.

## 2019-09-12 ENCOUNTER — Inpatient Hospital Stay: Payer: Medicare HMO | Admitting: Oncology

## 2019-09-12 ENCOUNTER — Ambulatory Visit: Payer: Medicare HMO

## 2019-09-15 ENCOUNTER — Emergency Department: Payer: Medicare HMO

## 2019-09-15 ENCOUNTER — Inpatient Hospital Stay: Payer: Medicare HMO

## 2019-09-15 ENCOUNTER — Inpatient Hospital Stay
Admission: EM | Admit: 2019-09-15 | Discharge: 2019-09-27 | DRG: 871 | Disposition: A | Discharge status: Home Health | Payer: Medicare HMO | Attending: Internal Medicine | Admitting: Internal Medicine

## 2019-09-15 ENCOUNTER — Other Ambulatory Visit: Payer: Self-pay

## 2019-09-15 DIAGNOSIS — I82A12 Acute embolism and thrombosis of left axillary vein: Secondary | ICD-10-CM | POA: Diagnosis present

## 2019-09-15 DIAGNOSIS — Z7951 Long term (current) use of inhaled steroids: Secondary | ICD-10-CM

## 2019-09-15 DIAGNOSIS — C662 Malignant neoplasm of left ureter: Secondary | ICD-10-CM | POA: Diagnosis present

## 2019-09-15 DIAGNOSIS — I82622 Acute embolism and thrombosis of deep veins of left upper extremity: Secondary | ICD-10-CM | POA: Diagnosis present

## 2019-09-15 DIAGNOSIS — R401 Stupor: Secondary | ICD-10-CM | POA: Diagnosis not present

## 2019-09-15 DIAGNOSIS — R0902 Hypoxemia: Secondary | ICD-10-CM

## 2019-09-15 DIAGNOSIS — A419 Sepsis, unspecified organism: Secondary | ICD-10-CM | POA: Diagnosis present

## 2019-09-15 DIAGNOSIS — N189 Chronic kidney disease, unspecified: Secondary | ICD-10-CM | POA: Diagnosis present

## 2019-09-15 DIAGNOSIS — J9601 Acute respiratory failure with hypoxia: Secondary | ICD-10-CM

## 2019-09-15 DIAGNOSIS — R0603 Acute respiratory distress: Secondary | ICD-10-CM

## 2019-09-15 DIAGNOSIS — R310 Gross hematuria: Secondary | ICD-10-CM | POA: Diagnosis not present

## 2019-09-15 DIAGNOSIS — F17210 Nicotine dependence, cigarettes, uncomplicated: Secondary | ICD-10-CM | POA: Diagnosis present

## 2019-09-15 DIAGNOSIS — G9341 Metabolic encephalopathy: Secondary | ICD-10-CM | POA: Diagnosis present

## 2019-09-15 DIAGNOSIS — R001 Bradycardia, unspecified: Secondary | ICD-10-CM | POA: Diagnosis present

## 2019-09-15 DIAGNOSIS — G253 Myoclonus: Secondary | ICD-10-CM | POA: Diagnosis not present

## 2019-09-15 DIAGNOSIS — I441 Atrioventricular block, second degree: Secondary | ICD-10-CM | POA: Diagnosis present

## 2019-09-15 DIAGNOSIS — I161 Hypertensive emergency: Secondary | ICD-10-CM | POA: Diagnosis not present

## 2019-09-15 DIAGNOSIS — J690 Pneumonitis due to inhalation of food and vomit: Secondary | ICD-10-CM | POA: Diagnosis present

## 2019-09-15 DIAGNOSIS — F419 Anxiety disorder, unspecified: Secondary | ICD-10-CM | POA: Diagnosis not present

## 2019-09-15 DIAGNOSIS — E875 Hyperkalemia: Secondary | ICD-10-CM | POA: Diagnosis present

## 2019-09-15 DIAGNOSIS — R569 Unspecified convulsions: Secondary | ICD-10-CM | POA: Diagnosis not present

## 2019-09-15 DIAGNOSIS — L819 Disorder of pigmentation, unspecified: Secondary | ICD-10-CM

## 2019-09-15 DIAGNOSIS — J9690 Respiratory failure, unspecified, unspecified whether with hypoxia or hypercapnia: Secondary | ICD-10-CM

## 2019-09-15 DIAGNOSIS — D472 Monoclonal gammopathy: Secondary | ICD-10-CM | POA: Diagnosis present

## 2019-09-15 DIAGNOSIS — E785 Hyperlipidemia, unspecified: Secondary | ICD-10-CM | POA: Diagnosis present

## 2019-09-15 DIAGNOSIS — Z915 Personal history of self-harm: Secondary | ICD-10-CM

## 2019-09-15 DIAGNOSIS — I468 Cardiac arrest due to other underlying condition: Secondary | ICD-10-CM | POA: Diagnosis not present

## 2019-09-15 DIAGNOSIS — Z79899 Other long term (current) drug therapy: Secondary | ICD-10-CM

## 2019-09-15 DIAGNOSIS — F319 Bipolar disorder, unspecified: Secondary | ICD-10-CM | POA: Diagnosis present

## 2019-09-15 DIAGNOSIS — J9621 Acute and chronic respiratory failure with hypoxia: Secondary | ICD-10-CM | POA: Diagnosis present

## 2019-09-15 DIAGNOSIS — Z20822 Contact with and (suspected) exposure to covid-19: Secondary | ICD-10-CM | POA: Diagnosis present

## 2019-09-15 DIAGNOSIS — I129 Hypertensive chronic kidney disease with stage 1 through stage 4 chronic kidney disease, or unspecified chronic kidney disease: Secondary | ICD-10-CM | POA: Diagnosis present

## 2019-09-15 DIAGNOSIS — J9600 Acute respiratory failure, unspecified whether with hypoxia or hypercapnia: Secondary | ICD-10-CM

## 2019-09-15 DIAGNOSIS — Z7982 Long term (current) use of aspirin: Secondary | ICD-10-CM

## 2019-09-15 DIAGNOSIS — I160 Hypertensive urgency: Secondary | ICD-10-CM | POA: Diagnosis not present

## 2019-09-15 DIAGNOSIS — K219 Gastro-esophageal reflux disease without esophagitis: Secondary | ICD-10-CM | POA: Diagnosis present

## 2019-09-15 DIAGNOSIS — J440 Chronic obstructive pulmonary disease with acute lower respiratory infection: Secondary | ICD-10-CM | POA: Diagnosis present

## 2019-09-15 DIAGNOSIS — G893 Neoplasm related pain (acute) (chronic): Secondary | ICD-10-CM | POA: Diagnosis present

## 2019-09-15 DIAGNOSIS — I959 Hypotension, unspecified: Secondary | ICD-10-CM | POA: Diagnosis present

## 2019-09-15 DIAGNOSIS — I808 Phlebitis and thrombophlebitis of other sites: Secondary | ICD-10-CM | POA: Diagnosis present

## 2019-09-15 DIAGNOSIS — Z452 Encounter for adjustment and management of vascular access device: Secondary | ICD-10-CM

## 2019-09-15 DIAGNOSIS — N179 Acute kidney failure, unspecified: Secondary | ICD-10-CM | POA: Diagnosis present

## 2019-09-15 DIAGNOSIS — R4182 Altered mental status, unspecified: Secondary | ICD-10-CM | POA: Diagnosis present

## 2019-09-15 DIAGNOSIS — G9389 Other specified disorders of brain: Secondary | ICD-10-CM

## 2019-09-15 DIAGNOSIS — Z96642 Presence of left artificial hip joint: Secondary | ICD-10-CM | POA: Diagnosis present

## 2019-09-15 DIAGNOSIS — R8290 Unspecified abnormal findings in urine: Secondary | ICD-10-CM

## 2019-09-15 DIAGNOSIS — Z8249 Family history of ischemic heart disease and other diseases of the circulatory system: Secondary | ICD-10-CM

## 2019-09-15 DIAGNOSIS — Z886 Allergy status to analgesic agent status: Secondary | ICD-10-CM

## 2019-09-15 DIAGNOSIS — R319 Hematuria, unspecified: Secondary | ICD-10-CM | POA: Diagnosis not present

## 2019-09-15 DIAGNOSIS — R079 Chest pain, unspecified: Secondary | ICD-10-CM | POA: Diagnosis not present

## 2019-09-15 DIAGNOSIS — I82B12 Acute embolism and thrombosis of left subclavian vein: Secondary | ICD-10-CM | POA: Diagnosis present

## 2019-09-15 DIAGNOSIS — Z885 Allergy status to narcotic agent status: Secondary | ICD-10-CM

## 2019-09-15 LAB — TROPONIN I (HIGH SENSITIVITY)
Troponin I (High Sensitivity): 94 ng/L — ABNORMAL HIGH (ref <18)
Troponin I (High Sensitivity): 118 ng/L (ref <18)

## 2019-09-15 LAB — BLOOD GAS, ARTERIAL
Acid-Base Excess: 0.2 mmol/L (ref 0.0–2.0)
Acid-base deficit: 6.1 mmol/L — ABNORMAL HIGH (ref 0.0–2.0)
Bicarbonate: 27.1 mmol/L (ref 20.0–28.0)
Bicarbonate: 21.4 mmol/L (ref 20.0–28.0)
FIO2: 0.6
FIO2: 1
MECHVT: 400 mL
MECHVT: 450 mL
Mechanical Rate: 18
O2 Saturation: 72.9 %
O2 Saturation: 96.5 %
PEEP: 5 cmH2O
PEEP: 15 cmH2O
Patient temperature: 37
Patient temperature: 37
RATE: 14 resp/min
pCO2 arterial: 55 mmHg — ABNORMAL HIGH (ref 32.0–48.0)
pCO2 arterial: 50 mmHg — ABNORMAL HIGH (ref 32.0–48.0)
pH, Arterial: 7.3 — ABNORMAL LOW (ref 7.350–7.450)
pH, Arterial: 7.24 — ABNORMAL LOW (ref 7.350–7.450)
pO2, Arterial: 43 mmHg — ABNORMAL LOW (ref 83.0–108.0)
pO2, Arterial: 100 mmHg (ref 83.0–108.0)

## 2019-09-15 LAB — URINALYSIS, COMPLETE (UACMP) WITH MICROSCOPIC
Bilirubin Urine: NEGATIVE
Glucose, UA: NEGATIVE mg/dL
Ketones, ur: 5 mg/dL — AB
Nitrite: NEGATIVE
Protein, ur: 100 mg/dL — AB
RBC / HPF: >50 RBC/hpf — ABNORMAL HIGH (ref 0–5)
Specific Gravity, Urine: 1.018 (ref 1.005–1.030)
Squamous Epithelial / HPF: NONE SEEN (ref 0–5)
pH: 5 (ref 5.0–8.0)

## 2019-09-15 LAB — PROCALCITONIN: Procalcitonin: 0.15 ng/mL

## 2019-09-15 LAB — CBC WITH DIFFERENTIAL/PLATELET
Abs Immature Granulocytes: 0.23 10*3/uL — ABNORMAL HIGH (ref 0.00–0.07)
Basophils Absolute: 0.1 10*3/uL (ref 0.0–0.1)
Basophils Relative: 1 %
Eosinophils Absolute: 0.1 10*3/uL (ref 0.0–0.5)
Eosinophils Relative: 1 %
HCT: 31.6 % — ABNORMAL LOW (ref 36.0–46.0)
Hemoglobin: 10.7 g/dL — ABNORMAL LOW (ref 12.0–15.0)
Immature Granulocytes: 2 %
Lymphocytes Relative: 23 %
Lymphs Abs: 3.5 10*3/uL (ref 0.7–4.0)
MCH: 32.8 pg (ref 26.0–34.0)
MCHC: 33.9 g/dL (ref 30.0–36.0)
MCV: 96.9 fL (ref 80.0–100.0)
Monocytes Absolute: 0.6 10*3/uL (ref 0.1–1.0)
Monocytes Relative: 4 %
Neutro Abs: 10.5 10*3/uL — ABNORMAL HIGH (ref 1.7–7.7)
Neutrophils Relative %: 69 %
Platelets: 696 10*3/uL — ABNORMAL HIGH (ref 150–400)
RBC: 3.26 MIL/uL — ABNORMAL LOW (ref 3.87–5.11)
RDW: 16.6 % — ABNORMAL HIGH (ref 11.5–15.5)
WBC: 15 10*3/uL — ABNORMAL HIGH (ref 4.0–10.5)
nRBC: 0.6 % — ABNORMAL HIGH (ref 0.0–0.2)

## 2019-09-15 LAB — GLUCOSE, CAPILLARY
Glucose-Capillary: 100 mg/dL — ABNORMAL HIGH (ref 70–99)
Glucose-Capillary: 191 mg/dL — ABNORMAL HIGH (ref 70–99)

## 2019-09-15 LAB — BASIC METABOLIC PANEL
Anion gap: 9 (ref 5–15)
BUN: 50 mg/dL — ABNORMAL HIGH (ref 8–23)
CO2: 21 mmol/L — ABNORMAL LOW (ref 22–32)
Calcium: 8.4 mg/dL — ABNORMAL LOW (ref 8.9–10.3)
Chloride: 106 mmol/L (ref 98–111)
Creatinine, Ser: 2.45 mg/dL — ABNORMAL HIGH (ref 0.44–1.00)
GFR calc Af Amer: 22 mL/min — ABNORMAL LOW (ref >60)
GFR calc non Af Amer: 19 mL/min — ABNORMAL LOW (ref >60)
Glucose, Bld: 167 mg/dL — ABNORMAL HIGH (ref 70–99)
Potassium: 6.1 mmol/L — ABNORMAL HIGH (ref 3.5–5.1)
Sodium: 136 mmol/L (ref 135–145)

## 2019-09-15 LAB — COMPREHENSIVE METABOLIC PANEL
ALT: 40 U/L (ref 0–44)
AST: 40 U/L (ref 15–41)
Albumin: 3.7 g/dL (ref 3.5–5.0)
Alkaline Phosphatase: 61 U/L (ref 38–126)
Anion gap: 8 (ref 5–15)
BUN: 54 mg/dL — ABNORMAL HIGH (ref 8–23)
CO2: 24 mmol/L (ref 22–32)
Calcium: 8.6 mg/dL — ABNORMAL LOW (ref 8.9–10.3)
Chloride: 100 mmol/L (ref 98–111)
Creatinine, Ser: 2.64 mg/dL — ABNORMAL HIGH (ref 0.44–1.00)
GFR calc Af Amer: 20 mL/min — ABNORMAL LOW (ref >60)
GFR calc non Af Amer: 17 mL/min — ABNORMAL LOW (ref >60)
Glucose, Bld: 129 mg/dL — ABNORMAL HIGH (ref 70–99)
Potassium: >7.5 mmol/L (ref 3.5–5.1)
Sodium: 132 mmol/L — ABNORMAL LOW (ref 135–145)
Total Bilirubin: 0.7 mg/dL (ref 0.3–1.2)
Total Protein: 6.8 g/dL (ref 6.5–8.1)

## 2019-09-15 LAB — CORTISOL: Cortisol, Plasma: 29.7 ug/dL

## 2019-09-15 LAB — LACTIC ACID, PLASMA: Lactic Acid, Venous: 1.3 mmol/L (ref 0.5–1.9)

## 2019-09-15 LAB — SARS CORONAVIRUS 2 BY RT PCR (HOSPITAL ORDER, PERFORMED IN ~~LOC~~ HOSPITAL LAB): SARS Coronavirus 2: NEGATIVE

## 2019-09-15 LAB — PROTIME-INR
INR: 1.1 (ref 0.8–1.2)
Prothrombin Time: 13.9 seconds (ref 11.4–15.2)

## 2019-09-15 LAB — MRSA PCR SCREENING: MRSA by PCR: NEGATIVE

## 2019-09-15 LAB — AMYLASE: Amylase: 81 U/L (ref 28–100)

## 2019-09-15 LAB — STREP PNEUMONIAE URINARY ANTIGEN: Strep Pneumo Urinary Antigen: NEGATIVE

## 2019-09-15 LAB — APTT: aPTT: 31 seconds (ref 24–36)

## 2019-09-15 LAB — LIPASE, BLOOD: Lipase: 29 U/L (ref 11–51)

## 2019-09-15 LAB — BRAIN NATRIURETIC PEPTIDE
B Natriuretic Peptide: 872 pg/mL — ABNORMAL HIGH (ref 0.0–100.0)
B Natriuretic Peptide: 789.6 pg/mL — ABNORMAL HIGH (ref 0.0–100.0)

## 2019-09-15 LAB — MAGNESIUM: Magnesium: 1.9 mg/dL (ref 1.7–2.4)

## 2019-09-15 LAB — PHOSPHORUS: Phosphorus: 4.3 mg/dL (ref 2.5–4.6)

## 2019-09-15 MED ORDER — INSULIN ASPART 100 UNIT/ML IV SOLN
10.0000 [IU] | Freq: Once | INTRAVENOUS | Status: AC
  Administered 2019-09-15: 10 [IU] via INTRAVENOUS
  Filled 2019-09-15: qty 0.1

## 2019-09-15 MED ORDER — DOCUSATE SODIUM 100 MG PO CAPS: 100.0000 mg | ORAL_CAPSULE | Freq: Two times a day (BID) | ORAL | Status: DC | PRN

## 2019-09-15 MED ORDER — DEXMEDETOMIDINE HCL IN NACL 400 MCG/100ML IV SOLN
0.0000 ug/kg/h | INTRAVENOUS | Status: DC
  Administered 2019-09-15: 0.5 ug/kg/h via INTRAVENOUS
  Filled 2019-09-15: qty 100

## 2019-09-15 MED ORDER — IPRATROPIUM-ALBUTEROL 0.5-2.5 (3) MG/3ML IN SOLN
9.0000 mL | Freq: Once | RESPIRATORY_TRACT | Status: AC
  Administered 2019-09-15: 9 mL via RESPIRATORY_TRACT
  Filled 2019-09-15: qty 9

## 2019-09-15 MED ORDER — FENTANYL CITRATE (PF) 100 MCG/2ML IJ SOLN: 25.0000 ug | INTRAMUSCULAR | Status: DC | PRN

## 2019-09-15 MED ORDER — ONDANSETRON HCL 4 MG/2ML IJ SOLN: 4.0000 mg | Freq: Four times a day (QID) | INTRAMUSCULAR | Status: DC | PRN

## 2019-09-15 MED ORDER — DEXTROSE 50 % IV SOLN
1.0000 | Freq: Once | INTRAVENOUS | Status: AC
  Administered 2019-09-15: 50 mL via INTRAVENOUS
  Filled 2019-09-15: qty 50

## 2019-09-15 MED ORDER — INSULIN ASPART 100 UNIT/ML ~~LOC~~ SOLN
8.0000 [IU] | Freq: Once | SUBCUTANEOUS | Status: AC
  Administered 2019-09-15: 8 [IU] via INTRAVENOUS
  Filled 2019-09-15: qty 1

## 2019-09-15 MED ORDER — CALCIUM GLUCONATE 10 % IV SOLN
1.0000 g | Freq: Once | INTRAVENOUS | Status: AC
  Administered 2019-09-15: 1 g via INTRAVENOUS
  Filled 2019-09-15: qty 10

## 2019-09-15 MED ORDER — MIDAZOLAM HCL 2 MG/2ML IJ SOLN: 1.0000 mg | INTRAMUSCULAR | Status: DC | PRN

## 2019-09-15 MED ORDER — CALCIUM CHLORIDE 10 % IV SOLN
INTRAVENOUS | Status: AC
  Filled 2019-09-15: qty 10

## 2019-09-15 MED ORDER — INSULIN REGULAR HUMAN 100 UNIT/ML IJ SOLN
10.0000 [IU] | Freq: Once | INTRAMUSCULAR | Status: AC
  Administered 2019-09-15: 10 [IU] via INTRAVENOUS
  Filled 2019-09-15: qty 10

## 2019-09-15 MED ORDER — LACTATED RINGERS IV BOLUS
1000.0000 mL | Freq: Once | INTRAVENOUS | Status: AC
  Administered 2019-09-15: 1000 mL via INTRAVENOUS

## 2019-09-15 MED ORDER — POLYETHYLENE GLYCOL 3350 17 G PO PACK
17.0000 g | PACK | Freq: Every day | ORAL | Status: DC | PRN
  Administered 2019-09-16: 17 g via ORAL

## 2019-09-15 MED ORDER — LEVETIRACETAM IN NACL 500 MG/100ML IV SOLN
500.0000 mg | Freq: Two times a day (BID) | INTRAVENOUS | Status: DC
  Administered 2019-09-15 – 2019-09-17 (×4): 500 mg via INTRAVENOUS
  Filled 2019-09-15 (×5): qty 100

## 2019-09-15 MED ORDER — FENTANYL CITRATE (PF) 100 MCG/2ML IJ SOLN
INTRAMUSCULAR | Status: AC
  Administered 2019-09-15: 50 ug via INTRAVENOUS
  Filled 2019-09-15: qty 2

## 2019-09-15 MED ORDER — SODIUM CHLORIDE 0.9 % IV SOLN
500.0000 mg | Freq: Once | INTRAVENOUS | Status: AC
  Administered 2019-09-15: 500 mg via INTRAVENOUS
  Filled 2019-09-15: qty 500

## 2019-09-15 MED ORDER — NOREPINEPHRINE 4 MG/250ML-% IV SOLN
0.0000 ug/min | INTRAVENOUS | Status: DC
  Administered 2019-09-15: 2 ug/min via INTRAVENOUS
  Filled 2019-09-15: qty 250

## 2019-09-15 MED ORDER — DOPAMINE-DEXTROSE 3.2-5 MG/ML-% IV SOLN
INTRAVENOUS | Status: AC
  Administered 2019-09-15: 6.917 ug/kg/min via INTRAVENOUS
  Filled 2019-09-15: qty 250

## 2019-09-15 MED ORDER — PROPOFOL 1000 MG/100ML IV EMUL
5.0000 ug/kg/min | INTRAVENOUS | Status: DC
  Administered 2019-09-15: 5 ug/kg/min via INTRAVENOUS
  Administered 2019-09-16 (×3): 40 ug/kg/min via INTRAVENOUS
  Administered 2019-09-17: 15 ug/kg/min via INTRAVENOUS
  Filled 2019-09-15 (×5): qty 100

## 2019-09-15 MED ORDER — FENTANYL CITRATE (PF) 100 MCG/2ML IJ SOLN: 50.0000 ug | INTRAMUSCULAR | Status: DC | PRN

## 2019-09-15 MED ORDER — CHLORHEXIDINE GLUCONATE 0.12% ORAL RINSE (MEDLINE KIT)
15.0000 mL | Freq: Two times a day (BID) | OROMUCOSAL | Status: DC
  Administered 2019-09-15 – 2019-09-18 (×7): 15 mL via OROMUCOSAL

## 2019-09-15 MED ORDER — DEXTROSE 50 % IV SOLN
25.0000 g | Freq: Once | INTRAVENOUS | Status: AC
  Administered 2019-09-15: 25 g via INTRAVENOUS
  Filled 2019-09-15: qty 50

## 2019-09-15 MED ORDER — SODIUM CHLORIDE 0.9 % IV SOLN
3.0000 g | Freq: Two times a day (BID) | INTRAVENOUS | Status: DC
  Administered 2019-09-15 – 2019-09-17 (×4): 3 g via INTRAVENOUS
  Filled 2019-09-15: qty 3
  Filled 2019-09-15 (×2): qty 8
  Filled 2019-09-15: qty 3
  Filled 2019-09-15: qty 8

## 2019-09-15 MED ORDER — SODIUM BICARBONATE 8.4 % IV SOLN
50.0000 meq | Freq: Once | INTRAVENOUS | Status: AC
  Administered 2019-09-15: 50 meq via INTRAVENOUS
  Filled 2019-09-15: qty 50

## 2019-09-15 MED ORDER — PROPOFOL 1000 MG/100ML IV EMUL
INTRAVENOUS | Status: AC
  Filled 2019-09-15: qty 100

## 2019-09-15 MED ORDER — SODIUM BICARBONATE-DEXTROSE 150-5 MEQ/L-% IV SOLN
150.0000 meq | INTRAVENOUS | Status: DC
  Administered 2019-09-15: 150 meq via INTRAVENOUS
  Filled 2019-09-15 (×2): qty 1000

## 2019-09-15 MED ORDER — ATROPINE SULFATE 1 MG/10ML IJ SOSY
PREFILLED_SYRINGE | INTRAMUSCULAR | Status: AC
  Filled 2019-09-15: qty 10

## 2019-09-15 MED ORDER — METHYLPREDNISOLONE SODIUM SUCC 125 MG IJ SOLR
125.0000 mg | Freq: Once | INTRAMUSCULAR | Status: AC
  Administered 2019-09-15: 125 mg via INTRAVENOUS
  Filled 2019-09-15: qty 2

## 2019-09-15 MED ORDER — PATIROMER SORBITEX CALCIUM 8.4 G PO PACK
16.8000 g | PACK | Freq: Every day | ORAL | Status: DC
  Administered 2019-09-15 – 2019-09-17 (×3): 16.8 g via ORAL
  Filled 2019-09-15 (×4): qty 2

## 2019-09-15 MED ORDER — PANTOPRAZOLE SODIUM 40 MG IV SOLR
40.0000 mg | Freq: Every day | INTRAVENOUS | Status: DC
  Administered 2019-09-15 – 2019-09-18 (×4): 40 mg via INTRAVENOUS
  Filled 2019-09-15 (×4): qty 40

## 2019-09-15 MED ORDER — FENTANYL 2500MCG IN NS 250ML (10MCG/ML) PREMIX INFUSION
0.0000 ug/h | INTRAVENOUS | Status: DC
  Administered 2019-09-17: 25 ug/h via INTRAVENOUS
  Filled 2019-09-15 (×3): qty 250

## 2019-09-15 MED ORDER — ORAL CARE MOUTH RINSE
15.0000 mL | OROMUCOSAL | Status: DC
  Administered 2019-09-15 – 2019-09-18 (×26): 15 mL via OROMUCOSAL

## 2019-09-15 MED ORDER — POLYETHYLENE GLYCOL 3350 17 G PO PACK
17.0000 g | PACK | Freq: Every day | ORAL | Status: DC
  Administered 2019-09-17 – 2019-09-20 (×3): 17 g via ORAL
  Filled 2019-09-15 (×4): qty 1

## 2019-09-15 MED ORDER — SODIUM CHLORIDE 0.9 % IV SOLN
INTRAVENOUS | Status: DC | PRN
  Administered 2019-09-15: 1000 mL via INTRAVENOUS
  Administered 2019-09-17: 250 mL via INTRAVENOUS

## 2019-09-15 MED ORDER — SODIUM CHLORIDE 0.9 % IV SOLN
1.0000 g | Freq: Once | INTRAVENOUS | Status: AC
  Administered 2019-09-15: 1 g via INTRAVENOUS
  Filled 2019-09-15: qty 10

## 2019-09-15 MED ORDER — IPRATROPIUM-ALBUTEROL 0.5-2.5 (3) MG/3ML IN SOLN: 9.0000 mL | Freq: Once | RESPIRATORY_TRACT | Status: DC

## 2019-09-15 MED ORDER — DOCUSATE SODIUM 50 MG/5ML PO LIQD
100.0000 mg | Freq: Two times a day (BID) | ORAL | Status: DC
  Administered 2019-09-16 – 2019-09-18 (×4): 100 mg via ORAL
  Filled 2019-09-15 (×4): qty 10

## 2019-09-15 MED ORDER — DOPAMINE-DEXTROSE 3.2-5 MG/ML-% IV SOLN: 0.0000 ug/kg/min | INTRAVENOUS | Status: DC

## 2019-09-15 NOTE — ED Notes (Signed)
20 mg etomidate given IV at this time. 70 mg of rocuronium given at this time.

## 2019-09-15 NOTE — ED Provider Notes (Signed)
[facility] Emergency Department Provider Note   ____________________________________________   First MD Initiated Contact with Patient 09/15/19 1452     (approximate)  I have reviewed the triage vital signs and the nursing notes.   HISTORY  Chief Complaint Altered Mental Status    HPI Pamela James is a 74 y.o. female with past medical history of hypertension, COPD, and GERD who presents to the ED for altered mental status and respiratory distress. History is limited due to patient's altered mental status. Per EMS, patient had not gotten out of bed this morning and was last known well sometime last night. Her son initially thought she was sleeping, but when he went to check on her at 2 PM found her unresponsive. With EMS she was initially hypoxic to the 50s on room air, subsequently placed on a nonrebreather. She remained minimally responsive during transport with bradycardia and hypotension. 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 She had reportedly been doing well since her procedure, but had been taking oxycodone regularly for pain.        Past Medical History:  Diagnosis Date  . Anxiety   . Asthma   . COPD (chronic obstructive pulmonary disease) ([place])   . Depression   . GERD (gastroesophageal reflux disease)   . Hypertension     Patient Active Problem List   Diagnosis Date Noted  . COPD exacerbation ([place]) 11/21/2017  . Acute bronchitis 03/26/2016  . Essential hypertension 03/26/2016  . Chest pain 03/25/2016    Past Surgical History:  Procedure Laterality Date  . ABDOMINAL HYSTERECTOMY    . TOTAL HIP ARTHROPLASTY Left   . TUBAL LIGATION      Prior to Admission medications   Medication Sig Start Date End Date Taking? Authorizing Provider  acetaminophen (TYLENOL) 500 MG tablet Take 500-1,000 mg by mouth every 6 (six) hours  as needed for moderate pain or fever.     [provider]  albuterol (ACCUNEB) 1.25 MG/3ML nebulizer solution Take 1 ampule by nebulization every 6 (six) hours as needed for wheezing or shortness of breath.    [provider]  albuterol (PROVENTIL HFA;VENTOLIN HFA) 108 (90 Base) MCG/ACT inhaler Inhale 2 puffs into the lungs every 6 (six) hours as needed for wheezing or shortness of breath.    [provider]  amLODipine (NORVASC) 10 MG tablet Take 10 mg by mouth daily.    [provider]  aspirin EC 81 MG EC tablet Take 1 tablet (81 mg total) by mouth daily. 03/27/16   Hillary Bow, MD  carvedilol (COREG) 12.5 MG tablet Take 1 tablet (12.5 mg total) by mouth 2 (two) times daily with a meal. 11/23/17   Mody, Sital, MD  Fluticasone-Salmeterol (ADVAIR) 250-50 MCG/DOSE AEPB Inhale 1 puff into the lungs every 12 (twelve) hours.     [provider]  ipratropium (ATROVENT HFA) 17 MCG/ACT inhaler Inhale 2 puffs into the lungs 3 (three) times daily.     [provider]  lisinopril (PRINIVIL,ZESTRIL) 40 MG tablet Take 40 mg by mouth daily.    [provider]  nicotine (NICODERM CQ - DOSED IN MG/24 HOURS) 21 mg/24hr patch Place 1 patch (21 mg total) onto the skin daily. 11/23/17   Bettey Costa, MD  omeprazole (PRILOSEC) 20 MG capsule Take 20 mg by mouth daily.    [provider]  pravastatin (PRAVACHOL) 20 MG  tablet Take 20 mg by mouth every evening.     [provider]  ranitidine (ZANTAC) 150 MG capsule Take 150 mg by mouth 2 (two) times daily.    [provider]    Allergies Aspirin, Morphine and related, and Nsaids  Family History  Problem Relation Age of Onset  . Heart disease Father   . Heart disease Sister     Social History Social History   Tobacco Use  . Smoking status: Current Every Day Smoker  . Smokeless tobacco: Never Used  Vaping Use  . Vaping Use: Never used  Substance Use Topics  . Alcohol  use: No  . Drug use: No    Review of Systems Unable to obtain secondary to altered mental status  ____________________________________________   PHYSICAL EXAM:  VITAL SIGNS: ED Triage Vitals  Enc Vitals Group     BP 09/15/19 1439 (!) 135/52     Pulse Rate 09/15/19 1439 (!) 54     Resp 09/15/19 1439 19     Temp 09/15/19 1443 98 F (36.7 C)     Temp Source 09/15/19 1443 Axillary     SpO2 09/15/19 1439 95 %     Weight 09/15/19 1440 170 lb (77.1 kg)     Height 09/15/19 1440 _0  (1.676 m)     Head Circumference --      Peak Flow --      Pain Score 09/15/19 1440 0     Pain Loc --      Pain Edu? --      Excl. in [place]? --     Constitutional: Unresponsive to voice. Eyes: Conjunctivae are normal. Pupils equal round and reactive to light bilaterally. Head: Atraumatic. Nose: No congestion/rhinnorhea. Mouth/Throat: Mucous membranes are moist. Neck: Normal ROM Cardiovascular: Bradycardic, irregularly irregular rhythm. Grossly normal heart sounds. Respiratory: Respiratory distress, tachypneic with retractions.. Lungs with crackles and rhonchi throughout. Gastrointestinal: Soft and nondistended Genitourinary: deferred Musculoskeletal: No lower extremity tenderness nor edema. Neurologic: Localizes to pain in all 4 extremities, no verbal response and does not open eyes to voice or pain. Skin:  Skin is warm, dry and intact. No rash noted. Psychiatric: Unable to assess.  ____________________________________________   LABS (all labs ordered are listed, but only abnormal results are displayed)  Labs Reviewed  GLUCOSE, CAPILLARY - Abnormal; Notable for the following components:      Result Value   Glucose-Capillary 100 (*)    All other components within normal limits  CBC WITH DIFFERENTIAL/PLATELET - Abnormal; Notable for the following components:   WBC 15.0 (*)    RBC 3.26 (*)    Hemoglobin 10.7 (*)    HCT 31.6 (*)    RDW 16.6 (*)    Platelets 696 (*)    nRBC 0.6 (*)     Neutro Abs 10.5 (*)    Abs Immature Granulocytes 0.23 (*)    All other components within normal limits  COMPREHENSIVE METABOLIC PANEL - Abnormal; Notable for the following components:   Sodium 132 (*)    Potassium >7.5 (*)    Glucose, Bld 129 (*)    BUN 54 (*)    Creatinine, Ser 2.64 (*)    Calcium 8.6 (*)    GFR calc non Af Amer 17 (*)    GFR calc Af Amer 20 (*)    All other components within normal limits  BRAIN NATRIURETIC PEPTIDE - Abnormal; Notable for the following components:   B Natriuretic Peptide 872.0 (*)    All  other components within normal limits  BLOOD GAS, VENOUS - Abnormal; Notable for the following components:   pO2, Ven <31.0 (*)    All other components within normal limits  TROPONIN I (HIGH SENSITIVITY) - Abnormal; Notable for the following components:   Troponin I (High Sensitivity) 94 (*)    All other components within normal limits  CULTURE, BLOOD (ROUTINE X 2)  CULTURE, BLOOD (ROUTINE X 2)  LACTIC ACID, PLASMA  URINALYSIS, COMPLETE (UACMP) WITH MICROSCOPIC  LACTIC ACID, PLASMA  BLOOD GAS, ARTERIAL   ____________________________________________  EKG  ED ECG REPORT I, Blake Divine, the attending physician, personally viewed and interpreted this ECG.   Date: 09/15/2019  EKG Time: 14:36  Rate: 74  Rhythm: 2nd degree heart block vs 1st degree block with PAC's  Axis: Normal  Intervals:none  ST&T Change: peaked T waves   PROCEDURES  Procedure(s) performed (including Critical Care):  Procedure Name: Intubation Date/Time: 09/15/2019 4:34 PM Performed by: Blake Divine, MD Pre-anesthesia Checklist: Patient identified, Patient being monitored, Emergency Drugs available, Timeout performed and Suction available Oxygen Delivery Method: Non-rebreather mask Preoxygenation: Pre-oxygenation with 100% oxygen Induction Type: Rapid sequence Ventilation: Mask ventilation without difficulty Laryngoscope Size: Mac and 4 Grade View: Grade I Tube size: 7.5  mm Number of attempts: 1 Airway Equipment and Method: Video-laryngoscopy Placement Confirmation: ETT inserted through vocal cords under direct vision,  CO2 detector and Breath sounds checked- equal and bilateral Secured at: 24 cm Tube secured with: ETT holder Dental Injury: Teeth and Oropharynx as per pre-operative assessment     .Critical Care Performed by: Blake Divine, MD Authorized by: Blake Divine, MD   Critical care provider statement:    Critical care time (minutes):  45   Critical care time was exclusive of:  Separately billable procedures and treating other patients and teaching time   Critical care was necessary to treat or prevent imminent or life-threatening deterioration of the following conditions:  Respiratory failure   Critical care was time spent personally by me on the following activities:  Discussions with consultants, evaluation of patient's response to treatment, examination of patient, ordering and performing treatments and interventions, ordering and review of laboratory studies, ordering and review of radiographic studies, pulse oximetry, re-evaluation of patient's condition, obtaining history from patient or surrogate and review of old charts   I assumed direction of critical care for this patient from another provider in my specialty: no       ____________________________________________   INITIAL IMPRESSION / ASSESSMENT AND PLAN / ED COURSE       74 year old female with past medical history of hypertension, COPD, and GERD presents to the ED for altered mental status and respiratory distress. She arrives tachypneic with accessory muscle use and minimally responsive, does seem to withdraw from pain in all 4 extremities. Patient was subsequently intubated, unfortunately first dose of induction medications was ineffective due to infiltrated IV and patient required second dose of etomidate and rocuronium. She was then intubated without difficulty and  follow-up chest x-ray shows complete opacification of left lung. I would be concerned for aspiration given son notes that patient seemed to vomit last night and she has been on increasing dose of pain medication. She was unresponsive to intranasal Narcan given by EMS. Lab work also concerning for acute renal failure with hyperkalemia, which is likely contributing to her bradycardia. We will treat with calcium, insulin, and bicarb. Treat with Rocephin and azithromycin given concern for pneumonia. Case was discussed with ICU for admission, patient to be  started on Levophed for ongoing hypotension.      ____________________________________________   FINAL CLINICAL IMPRESSION(S) / ED DIAGNOSES  Final diagnoses:  Respiratory distress  Aspiration pneumonia of left lung, unspecified aspiration pneumonia type, unspecified part of lung ([place])  Bradycardia  Hyperkalemia     ED Discharge Orders    None       Note:  This document was prepared using Dragon voice recognition software and may include unintentional dictation errors.   Blake Divine, MD 09/15/19 [phone]

## 2019-09-15 NOTE — ED Notes (Signed)
30 mg etomidate given at this time.

## 2019-09-15 NOTE — H&P (Addendum)
Name: Pamela James MRN: [id number] DOB: Sep 01, 1945    ADMISSION DATE:  09/15/2019   CONSULTATION DATE: 09/15/19  REFERRING MD :  Blake Divine MD  CHIEF COMPLAINT: Altered mental status   BRIEF PATIENT DESCRIPTION:  74 y.o female with PMH significant for COPD admitted with acute hypoxic respiratory failure and altered mental status requiring intubation.  SIGNIFICANT EVENTS  7/2- Presented to ED 7/2- Transient loss of pulse and hypoxia on the vent requiring manual bag ventilation and CPR with ROSC 7/2- Started on Dopamine Gtt  STUDIES:  7/2- CXR>>interval development of diffuse left lung opacity concerning for atelectasis or pneumonia with some degree of volume loss causing mild right to left mediastinal shift  CULTURES: CULTURES: SARS-CoV-2 PCR 7/2>>  MRSA PCR 7/2>> Blood culture 7/2>  ANTIBIOTICS: Unasyn 7/2>  HISTORY OF PRESENT ILLNESS:  74 y.o female with PMH significant for COPD, Asthma,  GERD, MGUS, Hydronephrosis of left Kidney, HLD, Anxiety and Depression, Thrombocytosis, Bipolar disorder, Thyroid disease, tobacco abuse, bleeding disorder,hx of gross hematuria and left hydronephrosis, s/p left ureteroscopy 5/27/2,  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  Patient's son report that pt was last seen normal the previous night when she went to bed. This am she was still not awake and when son went to check on her, she was unresponsive and almost "gasping for air". They also noticed that she had vomited in the bathroom carpet. No reports of chest pain, diaphoresis, LOC, abdominal pain or other associated of neurological deficit. Patient's son however noticed that the previous day she was leaning to the side when walking and not supporting her weight.  BGL 137 per ems, oxygen was 35% upon arrival and HR 46. Pt given 2 of narcan and placed on 15 liters NR- oxygen  increased to 85%.  ED Course:On arrival to the ED, she was afebrile with blood pressure 135/52 mm Hg and pulse rate 54 beats/min. There were no focal neurological deficits; She was noted to be tachypneic with accessory muscle use and minimally responsive, was able withdraw from pain in all 4 extremities. Patient was subsequently intubated for airway protection. Initial labs revealed Glucose 100, WBC 15.0, Platelets 696. Na+ 132, K+ >7.5, BUN 54, Cr 2.64, Troponin 94, BNP 872. VBG <pO2 <31. Patient received calcium gluconateX 2 , IV Insulin and Dextrose  In the ED. PCCM is consulted for further management.  PAST MEDICAL HISTORY :   has a past medical history of Anxiety, Asthma, COPD (chronic obstructive pulmonary disease) ([place]), Depression, GERD (gastroesophageal reflux disease), and Hypertension.  has a past surgical history that includes Tubal ligation; Total hip arthroplasty (Left); and Abdominal hysterectomy.   Prior to Admission medications   Medication Sig Start Date End Date Taking? Authorizing Provider  acetaminophen (TYLENOL) 500 MG tablet Take 500-1,000 mg by mouth every 6 (six) hours as needed for moderate pain or fever.     [provider]  albuterol (ACCUNEB) 1.25 MG/3ML nebulizer solution Take 1 ampule by nebulization every 6 (six) hours as needed for wheezing or shortness of breath.    [provider]  albuterol (PROVENTIL HFA;VENTOLIN HFA) 108 (90 Base) MCG/ACT inhaler Inhale 2 puffs into the lungs every 6 (six) hours as needed for wheezing or shortness of breath.    [provider]  amLODipine (NORVASC) 10 MG tablet Take 10 mg by mouth daily.    [provider]  aspirin EC 81 MG EC tablet Take 1 tablet (81 mg total) by mouth daily. 03/27/16   Hillary Bow, MD  carvedilol (COREG) 12.5 MG tablet Take 1 tablet (12.5 mg total) by mouth 2 (two) times daily with a meal. 11/23/17   Mody, Sital, MD  Fluticasone-Salmeterol (ADVAIR) 250-50 MCG/DOSE AEPB  Inhale 1 puff into the lungs every 12 (twelve) hours.     [provider]  ipratropium (ATROVENT HFA) 17 MCG/ACT inhaler Inhale 2 puffs into the lungs 3 (three) times daily.     [provider]  lisinopril (PRINIVIL,ZESTRIL) 40 MG tablet Take 40 mg by mouth daily.    [provider]  nicotine (NICODERM CQ - DOSED IN MG/24 HOURS) 21 mg/24hr patch Place 1 patch (21 mg total) onto the skin daily. 11/23/17   Bettey Costa, MD  omeprazole (PRILOSEC) 20 MG capsule Take 20 mg by mouth daily.    [provider]  pravastatin (PRAVACHOL) 20 MG tablet Take 20 mg by mouth every evening.     [provider]  ranitidine (ZANTAC) 150 MG capsule Take 150 mg by mouth 2 (two) times daily.    [provider]   Scheduled Meds: . atropine      . calcium chloride      . pantoprazole (PROTONIX) IV  40 mg Intravenous QHS   Continuous Infusions: . ampicillin-sulbactam (UNASYN) IV    . DOPamine 15 mcg/kg/min (09/15/19 1821)  . fentaNYL infusion INTRAVENOUS    . norepinephrine (LEVOPHED) Adult infusion Stopped (09/15/19 1815)  . propofol     PRN Meds:.docusate sodium, ondansetron (ZOFRAN) IV, polyethylene glycol   Allergies  Allergen Reactions  . Aspirin Other (See Comments)    GI ulcers  . Morphine And Related Other (See Comments)    Delerium, itching  . Nsaids Other (See Comments)    GI ulcers    FAMILY HISTORY:  family history includes Heart disease in her father and sister. SOCIAL HISTORY:  reports that she has been smoking. She has never used smokeless tobacco. She reports that she does not drink alcohol and does not use drugs.  REVIEW OF SYSTEMS:   UNABLE TO ASSESS DUE TO INTUBATION AND SEDATED  VITAL SIGNS: Temp:  [98 F (36.7 C)] 98 F (36.7 C) (07/02 1443) Pulse Rate:  [38-98] 49 (07/02 1535) Resp:  [15-33] 20 (07/02 1535) BP: (61-141)/(38-56) 83/48 (07/02 1533) SpO2:  [86 %-100 %] 96 % (07/02 1535) FiO2 (%):  [60 %] 60 % (07/02  1510) Weight:  [77.1 kg] 77.1 kg (07/02 1440)  PHYSICAL EXAMINATION: GENERAL: 74 year old patient lying in the bed on mechanical ventillation EYES:  No scleral icterus. Extraocular muscles intact.  HEENT: Head atraumatic, normocephalic. Oropharynx and nasopharynx clear.  NECK:  Supple, no jugular venous distention. No thyroid enlargement, no tenderness.  LUNGS: Decreased breath sounds bilaterally, no wheezing, rales,rhonchi or crepitation. No use of accessory muscles of respiration.  CARDIOVASCULAR: S1, S2 normal. No murmurs, rubs, or gallops.  ABDOMEN: Soft, nontender, nondistended. Bowel sounds present. No organomegaly or mass.  EXTREMITIES: Generalized dependent edema, cyanosis, or clubbing.  FOCUSED NEUROLOGIC:Patient does not respond to verbal stimuli.  Does not respond to deep sternal rub.  Does not follow commands.  No verbalizations noted.  Cranial Nerves: II: patient does not respond confrontation bilaterally, pupils right 2 mm, left 2 mm,and minimally bilaterally III,IV,VI: doll's response absent bilaterally.  V,VII: corneal reflex absent bilaterally  VIII: patient does not respond to verbal stimuli IX,X: gag reflex reduced, XI: trapezius strength unable  to test bilaterally XII: tongue strength unable to test Motor: Extremities flaccid throughout.  No spontaneous movement noted.  No purposeful movements noted. Sensory: Does not respond to noxious stimuli in any extremity. Deep Tendon Reflexes:  Absent throughout. Plantars: absent bilaterally Cerebellar: Unable to perform PSYCHIATRIC: Unable to assess.  SKIN: No obvious rash, lesion, or ulcer.   Recent Labs  Lab 09/15/19 1513  NA 132*  K >7.5*  CL 100  CO2 24  BUN 54*  CREATININE 2.64*  GLUCOSE 129*   Recent Labs  Lab 09/15/19 1513  HGB 10.7*  HCT 31.6*  WBC 15.0*  PLT 696*   DG Chest 1 View  Result Date: 09/15/2019 CLINICAL DATA:  Altered mental status. EXAM: CHEST  1 VIEW COMPARISON:  November 21, 2017.  FINDINGS: There is interval development of diffuse left lung opacity concerning for atelectasis or pneumonia with some degree of volume loss causing mild right to left mediastinal shift. No pneumothorax is noted. Possible small left pleural effusion is noted. The right lung is hyperexpanded but otherwise normal. Bony thorax is unremarkable. IMPRESSION: Interval development of diffuse left lung opacity concerning for atelectasis or pneumonia with some degree of volume loss causing mild right to left mediastinal shift. Possible small left pleural effusion. Aortic Atherosclerosis (ICD10-I70.0). Electronically Signed   By: Marijo Conception M.D.   On: 09/15/2019 15:01   DG Chest Portable 1 View  Result Date: 09/15/2019 CLINICAL DATA:  Intubated EXAM: PORTABLE CHEST 1 VIEW COMPARISON:  09/15/2019, 11/21/2017 FINDINGS: Interval intubation, tip of the endotracheal tube is about 2 cm superior to carina. Esophageal tube tip and side port overlie the mid gastric region. Right lung is clear. Mediastinal shift to the left consistent with volume loss. Diffuse opacity in the left thorax likely due to combination of pleural effusion and airspace disease. IMPRESSION: 1. Endotracheal tube tip about 2 cm superior to carina. Esophageal tube tip and side port overlie the mid stomach. 2. Volume loss in the left thorax with diffuse opacity in the left thorax, likely due to combination of pleural effusion and airspace disease. Central obstructing lesion is a concern, consider correlation with contrast-enhanced chest CT. Electronically Signed   By: Donavan Foil M.D.   On: 09/15/2019 15:40    Date: 09/15/2019 EKG Time: 14:36 Rate: 74 Rhythm: 2nd degree heart block vs 1st degree block with PAC's Axis: Normal Intervals:none ST&T Change: peaked T waves   ASSESSMENT / PLAN:  Acute Hypoxic  Respiratory Failure secondary to Aspiration Pneumonia? COPD without evidence of acute exacerbation -Admit to ICU -Full vent support for  now-vent settings reviewed and established  -SBT once all parameters met  -VAP bundle implemented -Trend WBC and monitor fever curve - -Follow intermittent ABG and chest x-ray as needed -Start Unasyn -As needed bronchodilators -Encourage smoking cessation  Significant Bradycardia - Secondary to Hyperkalemia? S/p transient loss of pulse with ROSC -EKG shows questionable Junctional rhythm/slow afib? -Start Dopamine gtt -Aggressive electrolyte replacement -Cardiology consult  Acute Kidney Injury Hyperkalemia Likely will need urgent HD -Monitor I&O's / urinary output -Follow BMP -Ensure adequate renal perfusion -Avoid nephrotoxic agents as able -Replace electrolytes as indicated -Nephrology consult  Left Ureteral tumor Hx: Left hydronephrosis, s/p left ureteroscopy 08/10/19 -Follows with [facility] -Urology consult  Acute Metabolic Encephalopathy -Provide supportive care      Disposition: ICU Goals of care: Full Code VTE prophylaxis:SCDs, Hx of Gross hematuria Updates: Unable to update patient at this time given altered mental status. Discussed goals of care with patient's  son at the bedside    Rufina Falco. DNP, CCRN, FNP-BC [facility]    09/15/2019, 4:34 PM

## 2019-09-15 NOTE — ED Notes (Signed)
Epinephrine given

## 2019-09-15 NOTE — ED Notes (Signed)
Pt intubated with 7.5, 24 at the lip at this time. RT EDP and two RN's present.

## 2019-09-15 NOTE — ED Notes (Signed)
Pt desaturating to 55%, Map unstable. EDP at bedside. Pt bagged by RT.

## 2019-09-15 NOTE — ED Notes (Addendum)
Thready femoral pulse, HR 35. CPR started.

## 2019-09-15 NOTE — Procedures (Signed)
Central Venous Catheter Insertion Procedure Note  Pamela James  [id number]  December 05, 1945  Date:09/15/19  Time:10:39 PM   Provider Performing:Namir Neto D Dewaine Conger   Procedure: Insertion of Non-tunneled Central Venous Catheter(36556)with US guidance (62194)    Indication(s) Medication administration, Difficult access and Hemodialysis  Consent Risks of the procedure as well as the alternatives and risks of each were explained to the patient and/or caregiver.  Consent for the procedure was obtained and is signed in the bedside chart  Anesthesia Topical only with 1% lidocaine   Timeout Verified patient identification, verified procedure, site/side was marked, verified correct patient position, special equipment/implants available, medications/allergies/relevant history reviewed, required imaging and test results available.  Sterile Technique Maximal sterile technique including full sterile barrier drape, hand hygiene, sterile gown, sterile gloves, mask, hair covering, sterile ultrasound probe cover (if used).  Procedure Description Area of catheter insertion was cleaned with chlorhexidine and draped in sterile fashion.   With real-time ultrasound guidance a HD catheter was placed into the left internal jugular vein.  Nonpulsatile blood flow and easy flushing noted in all ports.  The catheter was sutured in place and sterile dressing applied.  Complications/Tolerance None; patient tolerated the procedure well. Chest X-ray is ordered to verify placement for internal jugular or subclavian cannulation.  Chest x-ray is not ordered for femoral cannulation.  EBL Minimal  Specimen(s) None  Line was secured at the 20 cm mark.  BIOPATCH applied to the insertion site.   Darel Hong, AGACNP-BC [facility] Pager: [phone]

## 2019-09-15 NOTE — Consult Note (Signed)
Pharmacy Antibiotic Note  Kimball Manske is a 74 y.o. female admitted on 09/15/2019 with pneumonia.  Pharmacy has been consulted for Unasyn dosing.  Plan: Unasyn 3 g q12H   Height: 5\' 6"  (167.6 cm) Weight: 77.1 kg (170 lb) IBW/kg (Calculated) : 59.3  Temp (24hrs), Avg:98 F (36.7 C), Min:98 F (36.7 C), Max:98 F (36.7 C)  Recent Labs  Lab 09/15/19 1513  WBC 15.0*  CREATININE 2.64*  LATICACIDVEN 1.3    Estimated Creatinine Clearance: 19.3 mL/min (A) (by C-G formula based on SCr of 2.64 mg/dL (H)).    Allergies  Allergen Reactions  . Aspirin Other (See Comments)    GI ulcers  . Morphine And Related Other (See Comments)    Delerium, itching  . Nsaids Other (See Comments)    GI ulcers    Antimicrobials this admission: Ceftriaxone and azithromycin x 1  7/2 Unasyn >>   Dose adjustments this admission: None  Microbiology results: 7/2 BCx: Pending   Thank you for allowing pharmacy to be a part of this patient's care.  Oswald Hillock, PharmD, BCPS 09/15/2019 5:59 PM

## 2019-09-15 NOTE — ED Notes (Addendum)
Pulse check rythm check at this time. ROSC obtained.

## 2019-09-15 NOTE — ED Provider Notes (Signed)
-----------------------------------------   5:21 PM on 09/15/2019 -----------------------------------------  I was called into the room by the respiratory therapist due to hypoxia to the 50s.  We took the patient off the ventilator and started manually ventilating via an Ambu bag.  The patient had equal breath sounds bilaterally and good chest rise.  She was also found to be hypotensive and more bradycardic with a rate in the 40s at that time, and it appeared that the hypoxia was at least partially due to hypoperfusion.  We started the patient on Levophed.  I was concerned that the worsening bradycardia was related to persistent hyperkalemia, so we gave a second dose of calcium gluconate.  After several minutes, the patient responded appropriately with a map above 65, O2 saturation in high 80s, and improved heart rate.  The patient was placed back on the ventilator with increased PEEP.  She is awaiting a bed in the ICU.   ----------------------------------------- 6:33 PM on 09/15/2019 -----------------------------------------  I was called back in sooner due to hypoxia and worsening bradycardia.  The pulse was in the 30s and what appeared to be a junctional rhythm with a widened QRS, blood pressure was in the 30S systolic, and O2 saturation was unreadable.  We again manually ventilated the patient.  I was concerned for persistent effects due to hyperkalemia and gave an additional dose of calcium gluconate, sodium bicarbonate, and 8 units of insulin with D50.  After several minutes, the O2 saturation improved, but the blood pressure and heart rate were still very low, but the patient continued to have a palpable pulse.  I then gave a dose of atropine with no response.  After another few minutes, a pulse was no longer palpable and we started 1 round of CPR.  1 dose of epinephrine was given and after 3 minutes the patient had ROSC with an elevated blood pressure and a heart rate in the 80s.  During this  time I paged Dr. Patsey Berthold from the ICU who came to the bedside.  We switch the patient from peripheral Levophed to dopamine with a good response in the heart rate and blood pressure, and the decision was made to transport the patient to the ICU.   CRITICAL CARE Performed by: Arta Silence   Total critical care time: 60 minutes  Critical care time was exclusive of separately billable procedures and treating other patients.  Critical care was necessary to treat or prevent imminent or life-threatening deterioration.  Critical care was time spent personally by me on the following activities: development of treatment plan with patient and/or surrogate as well as nursing, discussions with consultants, evaluation of patient's response to treatment, examination of patient, obtaining history from patient or surrogate, ordering and performing treatments and interventions, ordering and review of laboratory studies, ordering and review of radiographic studies, pulse oximetry and re-evaluation of patient's condition.   ED ECG REPORT I, Arta Silence, the attending physician, personally viewed and interpreted this ECG.  Date: 09/15/2019 EKG Time: 1818 Rate: 55 Rhythm: Undetermined rhythm, possible complete heart block Intervals: Narrow QRS ST/T Wave abnormalities: normal Narrative Interpretation: no evidence of acute ischemia     Arta Silence, MD 09/15/19 1840

## 2019-09-15 NOTE — ED Notes (Signed)
OG tube placed on low intermittent suction.

## 2019-09-15 NOTE — ED Triage Notes (Addendum)
Per ems, son stated pt went to sleep last night and slept all through the day and would not wake up when son went to wake her. BGL 137 per ems, oxygen was 35% upon arrival and HR 46. Pt given 2 of narcan and placed on 15 liters NR- oxygen increased to 85%.  Pt is responsive to pain, oriented to self and situation only.

## 2019-09-15 NOTE — ED Notes (Signed)
100 mg rocuronium given at this time.

## 2019-09-15 NOTE — Progress Notes (Signed)
   09/15/19 0500  Clinical Encounter Type  Visited With Patient;Family  Visit Type Initial;Spiritual support;Social support  Referral From Nurse  Consult/Referral To Chaplain  Ch responded to page in ER. PT son was outside. Ch went outside to provide support to the son. Ch and son went back in Pt room. Mother was doing better. Pt sister came to give support. Ch will follow-up with Pt and family.

## 2019-09-16 ENCOUNTER — Inpatient Hospital Stay
Admit: 2019-09-16 | Discharge: 2019-09-16 | Disposition: A | Discharge status: Home / Self Care | Payer: Medicare HMO | Attending: Pulmonary Disease | Admitting: Pulmonary Disease

## 2019-09-16 ENCOUNTER — Inpatient Hospital Stay: Payer: Medicare HMO

## 2019-09-16 DIAGNOSIS — R401 Stupor: Secondary | ICD-10-CM

## 2019-09-16 LAB — BLOOD GAS, ARTERIAL
Acid-Base Excess: 3.9 mmol/L — ABNORMAL HIGH (ref 0.0–2.0)
Bicarbonate: 27.6 mmol/L (ref 20.0–28.0)
FIO2: 0.7
MECHVT: 450 mL
Mechanical Rate: 20
O2 Saturation: 99.1 %
PEEP: 15 cmH2O
Patient temperature: 37
pCO2 arterial: 37 mmHg (ref 32.0–48.0)
pH, Arterial: 7.48 — ABNORMAL HIGH (ref 7.350–7.450)
pO2, Arterial: 129 mmHg — ABNORMAL HIGH (ref 83.0–108.0)

## 2019-09-16 LAB — BASIC METABOLIC PANEL
Anion gap: 9 (ref 5–15)
Anion gap: 9 (ref 5–15)
BUN: 58 mg/dL — ABNORMAL HIGH (ref 8–23)
BUN: 57 mg/dL — ABNORMAL HIGH (ref 8–23)
CO2: 26 mmol/L (ref 22–32)
CO2: 27 mmol/L (ref 22–32)
Calcium: 9.5 mg/dL (ref 8.9–10.3)
Calcium: 9.4 mg/dL (ref 8.9–10.3)
Chloride: 100 mmol/L (ref 98–111)
Chloride: 99 mmol/L (ref 98–111)
Creatinine, Ser: 2.44 mg/dL — ABNORMAL HIGH (ref 0.44–1.00)
Creatinine, Ser: 2.14 mg/dL — ABNORMAL HIGH (ref 0.44–1.00)
GFR calc Af Amer: 22 mL/min — ABNORMAL LOW (ref >60)
GFR calc Af Amer: 25 mL/min — ABNORMAL LOW (ref >60)
GFR calc non Af Amer: 19 mL/min — ABNORMAL LOW (ref >60)
GFR calc non Af Amer: 22 mL/min — ABNORMAL LOW (ref >60)
Glucose, Bld: 160 mg/dL — ABNORMAL HIGH (ref 70–99)
Glucose, Bld: 141 mg/dL — ABNORMAL HIGH (ref 70–99)
Potassium: 5.4 mmol/L — ABNORMAL HIGH (ref 3.5–5.1)
Potassium: 5.1 mmol/L (ref 3.5–5.1)
Sodium: 135 mmol/L (ref 135–145)
Sodium: 135 mmol/L (ref 135–145)

## 2019-09-16 LAB — HEMOGLOBIN A1C
Hgb A1c MFr Bld: 5.4 % (ref 4.8–5.6)
Mean Plasma Glucose: 108.28 mg/dL

## 2019-09-16 LAB — HEPARIN LEVEL (UNFRACTIONATED): Heparin Unfractionated: 1.22 IU/mL — ABNORMAL HIGH (ref 0.30–0.70)

## 2019-09-16 LAB — GLUCOSE, CAPILLARY
Glucose-Capillary: 118 mg/dL — ABNORMAL HIGH (ref 70–99)
Glucose-Capillary: 125 mg/dL — ABNORMAL HIGH (ref 70–99)
Glucose-Capillary: 118 mg/dL — ABNORMAL HIGH (ref 70–99)
Glucose-Capillary: 396 mg/dL — ABNORMAL HIGH (ref 70–99)
Glucose-Capillary: 96 mg/dL (ref 70–99)
Glucose-Capillary: 145 mg/dL — ABNORMAL HIGH (ref 70–99)

## 2019-09-16 LAB — BLOOD CULTURE ID PANEL (REFLEXED)

## 2019-09-16 LAB — ECHOCARDIOGRAM COMPLETE
Height: 66 in
Weight: 2465.62 oz

## 2019-09-16 LAB — PHOSPHORUS: Phosphorus: 5 mg/dL — ABNORMAL HIGH (ref 2.5–4.6)

## 2019-09-16 LAB — CBC
HCT: 28.4 % — ABNORMAL LOW (ref 36.0–46.0)
Hemoglobin: 10 g/dL — ABNORMAL LOW (ref 12.0–15.0)
MCH: 32.7 pg (ref 26.0–34.0)
MCHC: 35.2 g/dL (ref 30.0–36.0)
MCV: 92.8 fL (ref 80.0–100.0)
Platelets: 439 10*3/uL — ABNORMAL HIGH (ref 150–400)
RBC: 3.06 MIL/uL — ABNORMAL LOW (ref 3.87–5.11)
RDW: 15.8 % — ABNORMAL HIGH (ref 11.5–15.5)
WBC: 16 10*3/uL — ABNORMAL HIGH (ref 4.0–10.5)
nRBC: 0.2 % (ref 0.0–0.2)

## 2019-09-16 LAB — TRIGLYCERIDES: Triglycerides: 117 mg/dL (ref <150)

## 2019-09-16 LAB — MAGNESIUM: Magnesium: 2 mg/dL (ref 1.7–2.4)

## 2019-09-16 LAB — PROCALCITONIN: Procalcitonin: 0.56 ng/mL

## 2019-09-16 MED ORDER — DEXMEDETOMIDINE HCL IN NACL 400 MCG/100ML IV SOLN
0.4000 ug/kg/h | INTRAVENOUS | Status: DC
  Administered 2019-09-16: 0.4 ug/kg/h via INTRAVENOUS
  Filled 2019-09-16: qty 100

## 2019-09-16 MED ORDER — INSULIN REGULAR HUMAN 100 UNIT/ML IJ SOLN
10.0000 [IU] | Freq: Once | INTRAMUSCULAR | Status: AC
  Administered 2019-09-16: 10 [IU] via INTRAVENOUS
  Filled 2019-09-16: qty 10

## 2019-09-16 MED ORDER — ASPIRIN 300 MG RE SUPP
300.0000 mg | Freq: Every day | RECTAL | Status: DC
  Administered 2019-09-16 – 2019-09-18 (×3): 300 mg via RECTAL
  Filled 2019-09-16 (×3): qty 1

## 2019-09-16 MED ORDER — ACETAMINOPHEN 160 MG/5ML PO SOLN
650.0000 mg | Freq: Four times a day (QID) | ORAL | Status: DC | PRN
  Administered 2019-09-17 – 2019-09-18 (×2): 650 mg
  Filled 2019-09-16 (×3): qty 20.3

## 2019-09-16 MED ORDER — DEXTROSE 50 % IV SOLN
12.5000 g | Freq: Once | INTRAVENOUS | Status: AC
  Administered 2019-09-16: 12.5 g via INTRAVENOUS
  Filled 2019-09-16: qty 50

## 2019-09-16 MED ORDER — HEPARIN BOLUS VIA INFUSION
4000.0000 [IU] | Freq: Once | INTRAVENOUS | Status: AC
  Administered 2019-09-16: 4000 [IU] via INTRAVENOUS
  Filled 2019-09-16: qty 4000

## 2019-09-16 MED ORDER — CHLORHEXIDINE GLUCONATE CLOTH 2 % EX PADS
6.0000 | MEDICATED_PAD | Freq: Every day | CUTANEOUS | Status: DC
  Administered 2019-09-16 – 2019-09-21 (×3): 6 via TOPICAL

## 2019-09-16 MED ORDER — HEPARIN (PORCINE) 25000 UT/250ML-% IV SOLN
1150.0000 [IU]/h | INTRAVENOUS | Status: DC
  Administered 2019-09-16 – 2019-09-17 (×2): 1150 [IU]/h via INTRAVENOUS
  Filled 2019-09-16 (×2): qty 250

## 2019-09-16 MED ORDER — INSULIN ASPART 100 UNIT/ML ~~LOC~~ SOLN
0.0000 [IU] | SUBCUTANEOUS | Status: DC
  Administered 2019-09-16 – 2019-09-24 (×6): 1 [IU] via SUBCUTANEOUS
  Filled 2019-09-16 (×6): qty 1

## 2019-09-16 MED FILL — Medication: Qty: 1 | Status: AC

## 2019-09-16 NOTE — Consult Note (Signed)
CARDIOLOGY CONSULT NOTE               Patient ID: Pamela James MRN: [id number] DOB/AGE: [birth year] 74 y.o.  Admit date: 09/15/2019 Referring Physician Rufina Falco FNP Primary Physician Dr. Tressia Miners primary Primary Cardiologist [facility] cardiology Reason for Consultation respiratory failure hyperkalemia bradycardia  HPI: Patient presented with respiratory failure hyperkalemia acidosis recently had a urological procedure requiring pain medication.  The patient presented with what looks like metabolic and respiratory acidosis resulting in hyperkalemia.  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 Anxiety   . Asthma   . COPD (chronic obstructive pulmonary disease) ([place])   . Depression   . GERD (gastroesophageal reflux disease)   . Hypertension     Past Surgical History:  Procedure Laterality Date  . ABDOMINAL HYSTERECTOMY    . TOTAL HIP ARTHROPLASTY Left   . TUBAL LIGATION      Medications Prior to Admission  Medication Sig Dispense Refill Last Dose  . albuterol (PROVENTIL HFA;VENTOLIN HFA) 108 (90 Base) MCG/ACT inhaler Inhale 2 puffs into the lungs every 6 (six) hours as needed for wheezing or shortness of breath.   Unknown at PRN  . albuterol (PROVENTIL) (2.5 MG/3ML) 0.083% nebulizer solution Take 2.5 mg by nebulization every 6 (six) hours as needed for wheezing or shortness of breath.   Unknown at PRN  . alendronate (FOSAMAX) 70 MG tablet Take 70 mg by mouth once a week. Take with a full glass  of water on an empty stomach.     Marland Kitchen amLODipine (NORVASC) 10 MG tablet Take 10 mg by mouth daily.   12-24 hours at Unknown  . aspirin EC 81 MG EC tablet Take 1 tablet (81 mg total) by mouth daily.   12-24 hours at Unknown  . carvedilol (COREG) 12.5 MG tablet Take 1 tablet (12.5 mg total) by mouth 2 (two) times daily with a meal. 60 tablet 0 12-24 hours at Unknown  . diphenhydrAMINE (BENADRYL) 25 mg capsule Take 25 mg by mouth every 6 (six) hours as needed for itching or allergies.   Unknown at PRN  . fluticasone-salmeterol (ADVAIR HFA) 230-21 MCG/ACT inhaler Inhale 2 puffs into the lungs 2 (two) times daily.   12-24 hours at Unknown  . gabapentin (NEURONTIN) 300 MG capsule Take 300 mg by mouth 2 (two) times daily.    12-24 hours at Unknown  . hydrALAZINE (APRESOLINE) 25 MG tablet Take 25 mg by mouth in the morning and at bedtime.   12-24 hours at Unknown  . Ipratropium-Albuterol (COMBIVENT) 20-100 MCG/ACT AERS respimat Inhale 1 puff into the lungs 4 (four) times daily.   12-24 hours at Unknown  . lisinopril (PRINIVIL,ZESTRIL) 40 MG tablet Take 40 mg by mouth daily.   12-24 hours at Unknown  . omeprazole (PRILOSEC) 20 MG capsule Take 20 mg by mouth daily.   12-24 hours at Unknown  . ondansetron (ZOFRAN-ODT) 4 MG disintegrating tablet Take 4 mg by mouth every 8 (eight) hours as needed for nausea or vomiting.   Unknown at PRN  . pravastatin (PRAVACHOL)  20 MG tablet Take 20 mg by mouth at bedtime.    12-24 hours at Unknown  . tamsulosin (FLOMAX) 0.4 MG CAPS capsule Take 0.4 mg by mouth daily.   12-24 hours at Unknown  . vitamin B-12 (CYANOCOBALAMIN) 1000 MCG tablet Take 1,000 mcg by mouth daily.      Social History   Socioeconomic History  . Marital status: Single    Spouse name: Not on file  . Number of children: Not on file  . Years of education: Not on file  . Highest education level: Not on file  Occupational History  . Occupation: retired  Tobacco Use  . Smoking status: Current Every Day  Smoker  . Smokeless tobacco: Never Used  Vaping Use  . Vaping Use: Never used  Substance and Sexual Activity  . Alcohol use: No  . Drug use: No  . Sexual activity: Not on file  Other Topics Concern  . Not on file  Social History Narrative  . Not on file   Social Determinants of Health   Financial Resource Strain:   . Difficulty of Paying Living Expenses:   Food Insecurity:   . Worried About Charity fundraiser in the Last Year:   . Arboriculturist in the Last Year:   Transportation Needs:   . Film/video editor (Medical):   Marland Kitchen Lack of Transportation (Non-Medical):   Physical Activity:   . Days of Exercise per Week:   . Minutes of Exercise per Session:   Stress:   . Feeling of Stress :   Social Connections:   . Frequency of Communication with Friends and Family:   . Frequency of Social Gatherings with Friends and Family:   . Attends Religious Services:   . Active Member of Clubs or Organizations:   . Attends Archivist Meetings:   Marland Kitchen Marital Status:   Intimate Partner Violence:   . Fear of Current or Ex-Partner:   . Emotionally Abused:   Marland Kitchen Physically Abused:   . Sexually Abused:     Family History  Problem Relation Age of Onset  . Heart disease Father   . Heart disease Sister       Review of systems complete and found to be negative unless listed above      PHYSICAL EXAM  General: Well developed, well nourished, in no acute distress respiratory failure HEENT:  Normocephalic and atramatic Neck:  No JVD.  Lungs: Clear bilaterally to auscultation and percussion. Heart: HRRR . Normal S1 and S2 without gallops or murmurs.  Abdomen: Bowel sounds are positive, abdomen soft and non-tender  Msk:  Back normal, normal gait. Normal strength and tone for age. Extremities: No clubbing, cyanosis or edema.   Neuro: Alert and oriented X 3. Psych:  Good affect, responds appropriately  Labs:   Lab Results  Component Value Date   WBC 16.0 (H) 09/16/2019    HGB 10.0 (L) 09/16/2019   HCT 28.4 (L) 09/16/2019   MCV 92.8 09/16/2019   PLT 439 (H) 09/16/2019    Recent Labs  Lab 09/15/19 1513 09/15/19 1707 09/16/19 0450  NA 132*   < > 135  K >7.5*   < > 5.1  CL 100   < > 99  CO2 24   < > 27  BUN 54*   < > 57*  CREATININE 2.64*   < > 2.14*  CALCIUM 8.6*   < > 9.4  PROT 6.8  --   --   BILITOT  0.7  --   --   ALKPHOS 61  --   --   ALT 40  --   --   AST 40  --   --   GLUCOSE 129*   < > 141*   < > = values in this interval not displayed.   Lab Results  Component Value Date   TROPONINI <0.03 11/21/2017    Lab Results  Component Value Date   CHOL 139 03/25/2016   Lab Results  Component Value Date   HDL 46 03/25/2016   Lab Results  Component Value Date   LDLCALC 76 03/25/2016   Lab Results  Component Value Date   TRIG 117 09/15/2019   TRIG 87 03/25/2016   Lab Results  Component Value Date   CHOLHDL 3.0 03/25/2016   No results found for: LDLDIRECT    Radiology: DG Chest 1 View  Result Date: 09/15/2019 CLINICAL DATA:  Deoxygenation. EXAM: CHEST  1 VIEW COMPARISON:  September 15, 2019 FINDINGS: There is stable endotracheal tube and nasogastric tube positioning. Mild left basilar atelectasis and/or infiltrate is seen. This is decreased in severity when compared to the prior study. A very small left pleural effusion is also seen. This is also decreased in size. There is mild elevation of the left hemidiaphragm. No pneumothorax is identified. The heart size and mediastinal contours are within normal limits. There is marked severity calcification of the aortic arch. The visualized skeletal structures are unremarkable. The proximal end of a left-sided endo ureteral stent is seen within the visualized portion of the abdomen. IMPRESSION: Mild left basilar atelectasis and/or infiltrate, decreased in size when compared to the prior study. Electronically Signed   By: Virgina Norfolk M.D.   On: 09/15/2019 17:58   DG Chest 1 View  Result Date:  09/15/2019 CLINICAL DATA:  Altered mental status. EXAM: CHEST  1 VIEW COMPARISON:  November 21, 2017. FINDINGS: There is interval development of diffuse left lung opacity concerning for atelectasis or pneumonia with some degree of volume loss causing mild right to left mediastinal shift. No pneumothorax is noted. Possible small left pleural effusion is noted. The right lung is hyperexpanded but otherwise normal. Bony thorax is unremarkable. IMPRESSION: Interval development of diffuse left lung opacity concerning for atelectasis or pneumonia with some degree of volume loss causing mild right to left mediastinal shift. Possible small left pleural effusion. Aortic Atherosclerosis (ICD10-I70.0). Electronically Signed   By: Marijo Conception M.D.   On: 09/15/2019 15:01   CT HEAD WO CONTRAST  Result Date: 09/16/2019 CLINICAL DATA:  Altered mental status, unclear cause EXAM: CT HEAD WITHOUT CONTRAST TECHNIQUE: Contiguous axial images were obtained from the base of the skull through the vertex without intravenous contrast. COMPARISON:  None. FINDINGS: Brain: Hypoattenuating focus in the left external capsule likely reflects a remote lacunar infarct. No evidence of acute infarction, hemorrhage, hydrocephalus, extra-axial collection or mass lesion/mass effect. Symmetric prominence of the ventricles, cisterns and sulci compatible with parenchymal volume loss. Patchy areas of white matter hypoattenuation are most compatible with chronic microvascular angiopathy. Senescent mineralization seen in the basal ganglia. Vascular: Atherosclerotic calcification of the carotid siphons. No hyperdense vessel. Skull: No calvarial fracture or suspicious osseous lesion. No scalp swelling or hematoma. Sinuses/Orbits: Minimal thickening in the ethmoids. Remaining paranasal sinuses and mastoid air cells are predominantly clear without pneumatized secretions or air-fluid levels. Hypo pneumatization of the frontal sinuses. Included orbital  structures are unremarkable. Other: None IMPRESSION: 1. 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 2. No definite acute intracranial findings. 3. Chronic microvascular angiopathy and parenchymal volume loss. Electronically Signed   By: Lovena Le M.D.   On: 09/16/2019 00:11   DG Chest Port 1 View  Result Date: 09/15/2019 CLINICAL DATA:  Central line placement. EXAM: PORTABLE CHEST 1 VIEW COMPARISON:  September 15, 2019 (7:18 p.m.) FINDINGS: A left internal jugular venous catheter is seen. Its distal tip is noted within the distal aspect of the superior vena cava. This represents a new finding when compared to the prior study. There is stable endotracheal tube and nasogastric tube positioning. Mild, stable left lower lobe atelectasis and/or infiltrate is seen. No pleural effusion or pneumothorax is identified. The heart size and mediastinal contours are within normal limits. The visualized skeletal structures are unremarkable. IMPRESSION: 1. Interval left internal jugular venous catheter placement with its distal tip in the superior vena cava. 2. Stable left lower lobe atelectasis and/or infiltrate. Electronically Signed   By: Virgina Norfolk M.D.   On: 09/15/2019 23:10   DG Chest Port 1 View  Result Date: 09/15/2019 CLINICAL DATA:  Hypoxia EXAM: PORTABLE CHEST 1 VIEW COMPARISON:  09/15/2019 FINDINGS: Support devices are stable. Left lower lobe atelectasis or infiltrate with probable layering left effusion. Right lung clear. Mild hyperinflation. Heart is normal size. No acute bony abnormality. IMPRESSION: Hyperinflation. Continued left lower lobe atelectasis or infiltrate with suspected layering left effusion. Support devices stable. Electronically Signed   By: Rolm Baptise M.D.   On: 09/15/2019 19:33   DG Chest Portable 1 View  Result Date: 09/15/2019 CLINICAL DATA:  Intubated EXAM: PORTABLE CHEST 1 VIEW COMPARISON:  09/15/2019, 11/21/2017 FINDINGS: Interval intubation, tip of the  endotracheal tube is about 2 cm superior to carina. Esophageal tube tip and side port overlie the mid gastric region. Right lung is clear. Mediastinal shift to the left consistent with volume loss. Diffuse opacity in the left thorax likely due to combination of pleural effusion and airspace disease. IMPRESSION: 1. Endotracheal tube tip about 2 cm superior to carina. Esophageal tube tip and side port overlie the mid stomach. 2. Volume loss in the left thorax with diffuse opacity in the left thorax, likely due to combination of pleural effusion and airspace disease. Central obstructing lesion is a concern, consider correlation with contrast-enhanced chest CT. Electronically Signed   By: Donavan Foil M.D.   On: 09/15/2019 15:40   ECHOCARDIOGRAM COMPLETE  Result Date: 09/16/2019    ECHOCARDIOGRAM REPORT   Patient Name:   Pamela James Date of Exam: 09/16/2019 Medical Rec #:  [id number]  Height:       66.0 in Accession #:    [id number] Weight:       154.1 lb Date of Birth:  04-30-1945   BSA:          1.790 m Patient Age:    39 years   BP:           122/52 mmHg Patient Gender: F          HR:           74 bpm. 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  Sonographer:     Arville Go RDCS Referring Phys:  [id number] Bradly Bienenstock Diagnosing Phys: Karma Greaser  Prince Rome MD  Sonographer Comments: Echo performed with patient supine and on artificial respirator. IMPRESSIONS  1. Left ventricular ejection fraction, by estimation, is 60 to 65%. The left ventricle has normal function. The left ventricle has no regional wall motion abnormalities. Left ventricular diastolic parameters were normal.  2. Right ventricular systolic function is normal. The right ventricular size is normal.  3. The mitral valve is grossly normal. No evidence of mitral valve regurgitation.  4. The aortic valve is  grossly normal. Aortic valve regurgitation is not visualized. FINDINGS  Left Ventricle: Left ventricular ejection fraction, by estimation, is 60 to 65%. The left ventricle has normal function. The left ventricle has no regional wall motion abnormalities. The left ventricular internal cavity size was normal in size. There is  borderline concentric left ventricular hypertrophy. Left ventricular diastolic parameters were normal. Right Ventricle: The right ventricular size is normal. No increase in right ventricular wall thickness. Right ventricular systolic function is normal. Left Atrium: Left atrial size was normal in size. Right Atrium: Right atrial size was normal in size. Pericardium: There is no evidence of pericardial effusion. Mitral Valve: The mitral valve is grossly normal. There is mild prolapse of of the mitral valve. There is mild thickening of the mitral valve leaflet(s). Normal mobility of the mitral valve leaflets. No evidence of mitral valve regurgitation. Tricuspid Valve: The tricuspid valve is normal in structure. Tricuspid valve regurgitation is trivial. Aortic Valve: The aortic valve is grossly normal. Aortic valve regurgitation is not visualized. Aortic valve peak gradient measures 4.4 mmHg. Pulmonic Valve: The pulmonic valve was grossly normal. Pulmonic valve regurgitation is not visualized. Aorta: The aortic root is normal in size and structure. IAS/Shunts: No atrial level shunt detected by color flow Doppler.  LEFT VENTRICLE PLAX 2D LVIDd:         3.91 cm  Diastology LVIDs:         2.69 cm  LV e' lateral:   7.29 cm/s LV PW:         1.09 cm  LV E/e' lateral: 7.2 LV IVS:        1.11 cm  LV e' medial:    5.33 cm/s LVOT diam:     2.00 cm  LV E/e' medial:  9.8 LV SV:         46 LV SV Index:   26 LVOT Area:     3.14 cm  RIGHT VENTRICLE RV Basal diam:  2.97 cm TAPSE (M-mode): 1.1 cm LEFT ATRIUM             Index       RIGHT ATRIUM          Index LA diam:        3.40 cm 1.90 cm/m  RA Area:     8.09  cm LA Vol (A2C):   16.3 ml 9.11 ml/m  RA Volume:   16.80 ml 9.39 ml/m LA Vol (A4C):   20.1 ml 11.23 ml/m LA Biplane Vol: 18.9 ml 10.56 ml/m  AORTIC VALVE                PULMONIC VALVE AV Area (Vmax): 2.05 cm    PV Vmax:       0.92 m/s AV Vmax:        105.00 cm/s PV Peak grad:  3.4 mmHg AV Peak Grad:   4.4 mmHg LVOT Vmax:      68.50 cm/s LVOT Vmean:     51.900 cm/s LVOT VTI:  0.146 m  AORTA Ao Root diam: 2.70 cm MITRAL VALVE               TRICUSPID VALVE MV Area (PHT): 2.73 cm    TV Peak grad:   29.8 mmHg MV Decel Time: 278 msec    TV Vmax:        2.73 m/s MV E velocity: 52.30 cm/s MV A velocity: 47.10 cm/s  SHUNTS MV E/A ratio:  1.11        Systemic VTI:  0.15 m                            Systemic Diam: 2.00 cm Tymia Streb D Shakeel Disney MD Electronically signed by Yolonda Kida MD Signature Date/Time: 09/16/2019/11:11:40 AM    Final     EKG: Normal sinus rhythm rate of 70  ASSESSMENT AND PLAN:  Bradycardia Respiratory failure Hyperkalemia Acute hypoxic Pneumonia aspiration COPD Acute metabolic encephalopathy Hyperlipidemia Smoking DVT left arm . Plan Agree with critical care management support Continue ventilatory care Broad-spectrum antibiotic therapy for pneumonia Wean dobutamine to off Recommend anticoagulation for left arm DVT Inhalers for COPD Continue conservative management from a cardiac standpoint for now   Signed: Yolonda Kida MD 09/16/2019, 12:12 PM

## 2019-09-16 NOTE — Progress Notes (Signed)
*  PRELIMINARY RESULTS* Echocardiogram 2D Echocardiogram has been performed.  Pamela James 09/16/2019, 9:39 AM

## 2019-09-16 NOTE — Progress Notes (Signed)
Pt transported on vent to and from CT without incident.

## 2019-09-16 NOTE — Consult Note (Signed)
ANTICOAGULATION CONSULT NOTE - Initial Consult  Pharmacy Consult for Heparin infusion  Indication: Subclavian clot  Allergies  Allergen Reactions  . Aspirin Other (See Comments)    GI ulcers  . Morphine And Related Other (See Comments)    Delerium, itching  . Nsaids Other (See Comments)    GI ulcers    Patient Measurements: Height: 5\' 6"  (167.6 cm) Weight: 69.9 kg (154 lb 1.6 oz) IBW/kg (Calculated) : 59.3  Vital Signs: Temp: 99.5 F (37.5 C) (07/03 2000) BP: 136/48 (07/03 2000) Pulse Rate: 62 (07/03 2000)  Labs: Recent Labs    09/15/19 1513 09/15/19 1513 09/15/19 1707 09/15/19 1725 09/15/19 2012 09/15/19 2325 09/16/19 0450 09/16/19 1851  HGB 10.7*  --   --   --   --   --  10.0*  --   HCT 31.6*  --   --   --   --   --  28.4*  --   PLT 696*  --   --   --   --   --  439*  --   APTT  --   --   --   --  31  --   --   --   LABPROT  --   --   --   --  13.9  --   --   --   INR  --   --   --   --  1.1  --   --   --   HEPARINUNFRC  --   --   --   --   --   --   --  1.22*  CREATININE 2.64*   < > 2.45*  --   --  2.44* 2.14*  --   TROPONINIHS 94*  --   --  118*  --   --   --   --    < > = values in this interval not displayed.    Estimated Creatinine Clearance: 21.3 mL/min (A) (by C-G formula based on SCr of 2.14 mg/dL (H)).   Medical History: Past Medical History:  Diagnosis Date  . Anxiety   . Asthma   . COPD (chronic obstructive pulmonary disease) ([place])   . Depression   . GERD (gastroesophageal reflux disease)   . Hypertension    Assessment: Pharmacy consulted for heparin infusion dosing and monitoring for 74 yo female for a subclavian clot. Baseline labs ordered.   Scr: 2.44>> 2.14    CrCl <97ml/min   APTT: 31, INR: 1.1   Hgb: 10   Plts: 439   Goal of Therapy:  Heparin level 0.3-0.7 units/ml Monitor platelets by anticoagulation protocol: Yes   Plan:  Give 4000 units bolus x 1 Start heparin infusion at 1150 units/hr Check anti-Xa level in 8 hours and  daily while on heparin Continue to monitor H&H and platelets   7/3: HL @ 1851 = 1.22  Spoke with RN, pt only has 1 IV line and most likely level was drawn as heparin gtt was infusing resulting in false elevated result.   Told RN to pause heparin gtt, flush line and redraw sample.    Orene Desanctis, PharmD Clinical Pharmacist 09/16/2019 8:12 PM

## 2019-09-16 NOTE — Progress Notes (Signed)
eeg completed ° °

## 2019-09-16 NOTE — Consult Note (Signed)
Pharmacy Antibiotic Note  Pamela James is a 74 y.o. female admitted on 09/15/2019 with pneumonia.  Pharmacy has been consulted for Unasyn dosing.  Plan: Day 2 IV antibiotics. Continue Unasyn 3 g q12H   Height: 5\' 6"  (167.6 cm) Weight: 69.9 kg (154 lb 1.6 oz) IBW/kg (Calculated) : 59.3  Temp (24hrs), Avg:99.4 F (37.4 C), Min:98 F (36.7 C), Max:100.3 F (37.9 C)  Recent Labs  Lab 09/15/19 1513 09/15/19 1707 09/15/19 2325 09/16/19 0450  WBC 15.0*  --   --  16.0*  CREATININE 2.64* 2.45* 2.44* 2.14*  LATICACIDVEN 1.3  --   --   --     Estimated Creatinine Clearance: 21.3 mL/min (A) (by C-G formula based on SCr of 2.14 mg/dL (H)).    Allergies  Allergen Reactions  . Aspirin Other (See Comments)    GI ulcers  . Morphine And Related Other (See Comments)    Delerium, itching  . Nsaids Other (See Comments)    GI ulcers    Antimicrobials this admission: Ceftriaxone and azithromycin x 1  7/2 Unasyn >>   Dose adjustments this admission: None  Microbiology results: 7/2 BCx: Pending   Thank you for allowing pharmacy to be a part of this patient's care.  Pernell Dupre, PharmD, BCPS 09/16/2019 11:41 AM

## 2019-09-16 NOTE — Consult Note (Signed)
ANTICOAGULATION CONSULT NOTE - Initial Consult  Pharmacy Consult for Heparin infusion  Indication: Subclavian clot  Allergies  Allergen Reactions  . Aspirin Other (See Comments)    GI ulcers  . Morphine And Related Other (See Comments)    Delerium, itching  . Nsaids Other (See Comments)    GI ulcers    Patient Measurements: Height: 5\' 6"  (167.6 cm) Weight: 69.9 kg (154 lb 1.6 oz) IBW/kg (Calculated) : 59.3  Vital Signs: Temp: 99.5 F (37.5 C) (07/03 0700) BP: 122/52 (07/03 0700) Pulse Rate: 75 (07/03 0700)  Labs: Recent Labs    09/15/19 1513 09/15/19 1513 09/15/19 1707 09/15/19 1725 09/15/19 2012 09/15/19 2325 09/16/19 0450  HGB 10.7*  --   --   --   --   --  10.0*  HCT 31.6*  --   --   --   --   --  28.4*  PLT 696*  --   --   --   --   --  439*  APTT  --   --   --   --  31  --   --   LABPROT  --   --   --   --  13.9  --   --   INR  --   --   --   --  1.1  --   --   CREATININE 2.64*   < > 2.45*  --   --  2.44* 2.14*  TROPONINIHS 94*  --   --  118*  --   --   --    < > = values in this interval not displayed.    Estimated Creatinine Clearance: 21.3 mL/min (A) (by C-G formula based on SCr of 2.14 mg/dL (H)).   Medical History: Past Medical History:  Diagnosis Date  . Anxiety   . Asthma   . COPD (chronic obstructive pulmonary disease) ([place])   . Depression   . GERD (gastroesophageal reflux disease)   . Hypertension    Assessment: Pharmacy consulted for heparin infusion dosing and monitoring for 74 yo female for a subclavian clot. Baseline labs ordered.   Scr: 2.44>> 2.14    CrCl <26ml/min   APTT: 31, INR: 1.1   Hgb: 10   Plts: 439   Goal of Therapy:  Heparin level 0.3-0.7 units/ml Monitor platelets by anticoagulation protocol: Yes   Plan:  Give 4000 units bolus x 1 Start heparin infusion at 1150 units/hr Check anti-Xa level in 8 hours and daily while on heparin Continue to monitor H&H and platelets  Pernell Dupre, PharmD, BCPS Clinical  Pharmacist 09/16/2019 10:50 AM

## 2019-09-16 NOTE — Consult Note (Signed)
[address] [place], [place] [address] Phone [phone]. Fax [phone]  Date: 09/16/2019                  Patient Name:  Pamela James  MRN: [id number]  DOB: 17-Jul-1945  Age / Sex: 74 y.o., female         PCP: Cindra Eves, MD                 Service Requesting Consult: IM/ Tyler Pita, MD                 Reason for Consult: ARF, hyperkalemia            History of Present Illness: Patient is a 74 y.o. female with medical problems of COPD, Asthma, GERD, MGUS, hydronephrosis of left kidney, anxiety, depression, who was admitted to [facility] on 09/15/2019 for evaluation of acute hypoxic resp failure  Patient is currently intubated and sedated therefore all information is obtained from the chart.  Per report patient was unresponsive and gasping for air and vomited in the bathroom.  EMS was called and oxygen calculation was 35% with bradycardia heart rate of 46.  Patient was brought to the ER where she was afebrile with blood pressure 135/52.  She was minimally responsive therefore was intubated for airway protection.  Initial labs showed potassium of 7.5, creatinine 2.64.  Patient was treated with shifting measures for hyperkalemia and transferred to ICU for further evaluation.    Medications: Outpatient medications: Medications Prior to Admission  Medication Sig Dispense Refill Last Dose  . albuterol (PROVENTIL HFA;VENTOLIN HFA) 108 (90 Base) MCG/ACT inhaler Inhale 2 puffs into the lungs every 6 (six) hours as needed for wheezing or shortness of breath.   Unknown at PRN  . albuterol (PROVENTIL) (2.5 MG/3ML) 0.083% nebulizer solution Take 2.5 mg by nebulization every 6 (six) hours as needed for wheezing or shortness of breath.   Unknown at PRN  . alendronate (FOSAMAX) 70 MG tablet Take 70 mg by mouth once a week. Take with a full glass of water on an empty stomach.     Marland Kitchen amLODipine (NORVASC) 10 MG tablet Take 10 mg by mouth daily.   12-24 hours at Unknown  . aspirin  EC 81 MG EC tablet Take 1 tablet (81 mg total) by mouth daily.   12-24 hours at Unknown  . carvedilol (COREG) 12.5 MG tablet Take 1 tablet (12.5 mg total) by mouth 2 (two) times daily with a meal. 60 tablet 0 12-24 hours at Unknown  . diphenhydrAMINE (BENADRYL) 25 mg capsule Take 25 mg by mouth every 6 (six) hours as needed for itching or allergies.   Unknown at PRN  . fluticasone-salmeterol (ADVAIR HFA) 230-21 MCG/ACT inhaler Inhale 2 puffs into the lungs 2 (two) times daily.   12-24 hours at Unknown  . gabapentin (NEURONTIN) 300 MG capsule Take 300 mg by mouth 2 (two) times daily.    12-24 hours at Unknown  . hydrALAZINE (APRESOLINE) 25 MG tablet Take 25 mg by mouth in the morning and at bedtime.   12-24 hours at Unknown  . Ipratropium-Albuterol (COMBIVENT) 20-100 MCG/ACT AERS respimat Inhale 1 puff into the lungs 4 (four) times daily.   12-24 hours at Unknown  . lisinopril (PRINIVIL,ZESTRIL) 40 MG tablet Take 40 mg by mouth daily.   12-24 hours at Unknown  . omeprazole (PRILOSEC) 20 MG capsule Take 20 mg by mouth daily.   12-24 hours at Unknown  . ondansetron (ZOFRAN-ODT) 4 MG  disintegrating tablet Take 4 mg by mouth every 8 (eight) hours as needed for nausea or vomiting.   Unknown at PRN  . pravastatin (PRAVACHOL) 20 MG tablet Take 20 mg by mouth at bedtime.    12-24 hours at Unknown  . tamsulosin (FLOMAX) 0.4 MG CAPS capsule Take 0.4 mg by mouth daily.   12-24 hours at Unknown  . vitamin B-12 (CYANOCOBALAMIN) 1000 MCG tablet Take 1,000 mcg by mouth daily.       Current medications: Current Facility-Administered Medications  Medication Dose Route Frequency Provider Last Rate Last Admin  . 0.9 %  sodium chloride infusion   Intravenous PRN Darel Hong D, NP 10 mL/hr at 09/16/19 0448 Rate Verify at 09/16/19 0448  . Ampicillin-Sulbactam (UNASYN) 3 g in sodium chloride 0.9 % 100 mL IVPB  3 g Intravenous Q12H Oswald Hillock, RPH 200 mL/hr at 09/16/19 0600 Rate Verify at 09/16/19 0600  .  chlorhexidine gluconate (MEDLINE KIT) (PERIDEX) 0.12 % solution 15 mL  15 mL Mouth Rinse BID Tyler Pita, MD   15 mL at 09/16/19 0709  . Chlorhexidine Gluconate Cloth 2 % PADS 6 each  6 each Topical Daily Darel Hong D, NP      . docusate (COLACE) 50 MG/5ML liquid 100 mg  100 mg Oral BID Darel Hong D, NP   100 mg at 09/16/19 0856  . docusate sodium (COLACE) capsule 100 mg  100 mg Oral BID PRN Tyler Pita, MD      . DOPamine (INTROPIN) 800 mg in dextrose 5 % 250 mL (3.2 mg/mL) infusion  0-20 mcg/kg/min Intravenous Continuous Tyler Pita, MD 1.45 mL/hr at 09/16/19 0600 1 mcg/kg/min at 09/16/19 0600  . fentaNYL (SUBLIMAZE) injection 25 mcg  25 mcg Intravenous Q15 min PRN Darel Hong D, NP      . fentaNYL (SUBLIMAZE) injection 25-100 mcg  25-100 mcg Intravenous Q30 min PRN Darel Hong D, NP      . fentaNYL 2536mg in NS 2529m(1072mml) infusion-PREMIX  0-400 mcg/hr Intravenous Continuous GonTyler PitaD      . insulin aspart (novoLOG) injection 0-9 Units  0-9 Units Subcutaneous Q4H KeeDarel Hong NP   1 Units at 09/16/19 [id number] levETIRAcetam (KEPPRA) IVPB 500 mg/100 mL premix  500 mg Intravenous Q12H KeeDarel Hong NP 400 mL/hr at 09/16/19 0911 500 mg at 09/16/19 0911  . MEDLINE mouth rinse  15 mL Mouth Rinse 10 times per day GonTyler PitaD   15 mL at 09/16/19 0904  . midazolam (VERSED) injection 1 mg  1 mg Intravenous Q15 min PRN KeeDarel Hong NP      . midazolam (VERSED) injection 1 mg  1 mg Intravenous Q2H PRN KeeDarel Hong NP      . norepinephrine (LEVOPHED) 4mg1m 250mL57mmix infusion  0-40 mcg/min Intravenous Continuous JessuBlake Divine  Stopped at 09/15/19 1815  . ondansetron (ZOFRAN) injection 4 mg  4 mg Intravenous Q6H PRN GonzaTyler Pita     . pantoprazole (PROTONIX) injection 40 mg  40 mg Intravenous QHS GonzaTyler Pita  40 mg at 09/15/19 2141  . patiromer (VELTDaryll Drownket 16.8 g  16.8 g Oral Daily  KeeneDarel HongP   16.8 g at 09/15/19 2025  . polyethylene glycol (MIRALAX / GLYCOLAX) packet 17 g  17 g Oral Daily PRN GonzaTyler Pita  17 g at 09/16/19 0857  . polyethylene glycol (MIRALAX /  GLYCOLAX) packet 17 g  17 g Oral Daily Darel Hong D, NP      . propofol (DIPRIVAN) 1000 MG/100ML infusion  5-80 mcg/kg/min Intravenous Titrated Darel Hong D, NP 16.78 mL/hr at 09/16/19 0600 40 mcg/kg/min at 09/16/19 0600  . sodium bicarbonate 150 mEq in dextrose 5% 1000 mL infusion  150 mEq Intravenous Continuous Kolluru, Sarath, MD 50 mL/hr at 09/16/19 0600 Rate Verify at 09/16/19 0600      Allergies: Allergies  Allergen Reactions  . Aspirin Other (See Comments)    GI ulcers  . Morphine And Related Other (See Comments)    Delerium, itching  . Nsaids Other (See Comments)    GI ulcers      Past Medical History: Past Medical History:  Diagnosis Date  . Anxiety   . Asthma   . COPD (chronic obstructive pulmonary disease) ([place])   . Depression   . GERD (gastroesophageal reflux disease)   . Hypertension      Past Surgical History: Past Surgical History:  Procedure Laterality Date  . ABDOMINAL HYSTERECTOMY    . TOTAL HIP ARTHROPLASTY Left   . TUBAL LIGATION       Family History: Family History  Problem Relation Age of Onset  . Heart disease Father   . Heart disease Sister      Social History: Social History   Socioeconomic History  . Marital status: Single    Spouse name: Not on file  . Number of children: Not on file  . Years of education: Not on file  . Highest education level: Not on file  Occupational History  . Occupation: retired  Tobacco Use  . Smoking status: Current Every Day Smoker  . Smokeless tobacco: Never Used  Vaping Use  . Vaping Use: Never used  Substance and Sexual Activity  . Alcohol use: No  . Drug use: No  . Sexual activity: Not on file  Other Topics Concern  . Not on file  Social History Narrative  . Not on file    Social Determinants of Health   Financial Resource Strain:   . Difficulty of Paying Living Expenses:   Food Insecurity:   . Worried About Charity fundraiser in the Last Year:   . Arboriculturist in the Last Year:   Transportation Needs:   . Film/video editor (Medical):   Marland Kitchen Lack of Transportation (Non-Medical):   Physical Activity:   . Days of Exercise per Week:   . Minutes of Exercise per Session:   Stress:   . Feeling of Stress :   Social Connections:   . Frequency of Communication with Friends and Family:   . Frequency of Social Gatherings with Friends and Family:   . Attends Religious Services:   . Active Member of Clubs or Organizations:   . Attends Archivist Meetings:   Marland Kitchen Marital Status:   Intimate Partner Violence:   . Fear of Current or Ex-Partner:   . Emotionally Abused:   Marland Kitchen Physically Abused:   . Sexually Abused:      Review of Systems: Not available Gen:  HEENT:  CV:  Resp:  GI: GU :  MS:  Derm:    Psych: Heme:  Neuro:  Endocrine  Vital Signs: Blood pressure (!) 122/52, pulse 75, temperature 99.5 F (37.5 C), resp. rate 20, height _0  (1.676 m), weight 69.9 kg, SpO2 100 %.   Intake/Output Summary (Last 24 hours) at 09/16/2019 0939 Last data filed at 09/16/2019 0600  Gross per 24 hour  Intake 2259.69 ml  Output 560 ml  Net 1699.69 ml    Weight trends: Filed Weights   09/15/19 1440 09/15/19 1850  Weight: 77.1 kg 69.9 kg    Physical Exam: General:  Critically ill-appearing  HEENT  eyes closed, closed 2 to 3 mm, small, round, ETT, OGT  Neck:  No masses  Lungs:  Ventilator assisted, FiO2 70%  Heart::  Regular rhythm  Abdomen:  Soft, nontender  Extremities:  No edema  Neurologic:  Sedated  Skin:  No acute rashes  Access:  Left IJ dialysis catheter  Foley:  In place       Lab results: Basic Metabolic Panel: Recent Labs  Lab 09/15/19 1707 09/15/19 1725 09/15/19 2325 09/16/19 0450  NA 136  --  135 135  K 6.1*   --  5.4* 5.1  CL 106  --  100 99  CO2 21*  --  26 27  GLUCOSE 167*  --  160* 141*  BUN 50*  --  58* 57*  CREATININE 2.45*  --  2.44* 2.14*  CALCIUM 8.4*  --  9.5 9.4  MG  --  1.9  --  2.0  PHOS  --  4.3  --  5.0*    Liver Function Tests: Recent Labs  Lab 09/15/19 1513  AST 40  ALT 40  ALKPHOS 61  BILITOT 0.7  PROT 6.8  ALBUMIN 3.7   Recent Labs  Lab 09/15/19 1725  LIPASE 29  AMYLASE 81   No results for input(s): AMMONIA in the last 168 hours.  CBC: Recent Labs  Lab 09/15/19 1513 09/16/19 0450  WBC 15.0* 16.0*  NEUTROABS 10.5*  --   HGB 10.7* 10.0*  HCT 31.6* 28.4*  MCV 96.9 92.8  PLT 696* 439*    Cardiac Enzymes: No results for input(s): CKTOTAL, TROPONINI in the last 168 hours.  BNP: Invalid input(s): POCBNP  CBG: Recent Labs  Lab 09/15/19 1435 09/15/19 1710 09/16/19 0422 09/16/19 0823  GLUCAP 100* 191* 118* 125*    Microbiology: Recent Results (from the past 720 hour(s))  Culture, blood (routine x 2)     Status: None (Preliminary result)   Collection Time: 09/15/19  3:13 PM   Specimen: BLOOD  Result Value Ref Range Status   Specimen Description BLOOD BLOOD LEFT HAND  Final   Special Requests   Final    BOTTLES DRAWN AEROBIC AND ANAEROBIC Blood Culture results may not be optimal due to an excessive volume of blood received in culture bottles   Culture   Final    NO GROWTH < 24 HOURS Performed at [facility], [address], [place], [place] [address]    Report Status PENDING  Incomplete  Culture, blood (routine x 2)     Status: None (Preliminary result)   Collection Time: 09/15/19  3:50 PM   Specimen: BLOOD  Result Value Ref Range Status   Specimen Description BLOOD LEFT ARM  Final   Special Requests   Final    BOTTLES DRAWN AEROBIC AND ANAEROBIC Blood Culture adequate volume   Culture   Final    NO GROWTH < 24 HOURS Performed at [facility], [address] [place],  [address]    Report Status PENDING   Incomplete  SARS Coronavirus 2 by RT PCR (hospital order, performed in [place] hospital lab) Nasopharyngeal Nasopharyngeal Swab     Status: None   Collection Time: 09/15/19  3:55 PM   Specimen: Nasopharyngeal Swab  Result Value Ref Range Status   SARS Coronavirus 2 NEGATIVE NEGATIVE Final    Comment: (NOTE) SARS-CoV-2 target nucleic acids are NOT DETECTED.  The SARS-CoV-2 RNA is generally detectable in upper and lower respiratory specimens during the acute phase of infection. The lowest concentration of SARS-CoV-2 viral copies this assay can detect is 250 copies / mL. A negative result does not preclude SARS-CoV-2 infection and should not be used as the sole basis for treatment or other patient management decisions.  A negative result may occur with improper specimen collection / handling, submission of specimen other than nasopharyngeal swab, presence of viral mutation(s) within the areas targeted by this assay, and inadequate number of viral copies (<250 copies / mL). A negative result must be combined with clinical observations, patient history, and epidemiological information.  Fact Sheet for Patients:   StrictlyIdeas.no  Fact Sheet for Healthcare Providers: BankingDealers.co.za  This test is not yet approved or  cleared by the [place] FDA and has been authorized for detection and/or diagnosis of SARS-CoV-2 by FDA under an Emergency Use Authorization (EUA).  This EUA will remain in effect (meaning this test can be used) for the duration of the COVID-19 declaration under Section 564(b)(1) of the Act, 21 U.S.C. section 360bbb-3(b)(1), unless the authorization is terminated or revoked sooner.  Performed at [facility], [address] [place], [place] [address]   MRSA PCR Screening     Status: None   Collection Time: 09/15/19  6:51 PM   Specimen: Nasal Mucosa; Nasopharyngeal  Result Value Ref Range Status    MRSA by PCR NEGATIVE NEGATIVE Final    Comment:        The GeneXpert MRSA Assay (FDA approved for NASAL specimens only), is one component of a comprehensive MRSA colonization surveillance program. It is not intended to diagnose MRSA infection nor to guide or monitor treatment for MRSA infections. Performed at [facility], [address] [place], [place] [address]      Coagulation Studies: Recent Labs    09/15/19 May 07, 2010  LABPROT 13.9  INR 1.1    Urinalysis: Recent Labs    09/15/19 1811  COLORURINE AMBER*  LABSPEC 1.018  PHURINE 5.0  GLUCOSEU NEGATIVE  HGBUR LARGE*  BILIRUBINUR NEGATIVE  KETONESUR 5*  PROTEINUR 100*  NITRITE NEGATIVE  LEUKOCYTESUR TRACE*        Imaging: DG Chest 1 View  Result Date: 09/15/2019 CLINICAL DATA:  Deoxygenation. EXAM: CHEST  1 VIEW COMPARISON:  September 15, 2019 FINDINGS: There is stable endotracheal tube and nasogastric tube positioning. Mild left basilar atelectasis and/or infiltrate is seen. This is decreased in severity when compared to the prior study. A very small left pleural effusion is also seen. This is also decreased in size. There is mild elevation of the left hemidiaphragm. No pneumothorax is identified. The heart size and mediastinal contours are within normal limits. There is marked severity calcification of the aortic arch. The visualized skeletal structures are unremarkable. The proximal end of a left-sided endo ureteral stent is seen within the visualized portion of the abdomen. IMPRESSION: Mild left basilar atelectasis and/or infiltrate, decreased in size when compared to the prior study. Electronically Signed   By: Virgina Norfolk M.D.   On: 09/15/2019 17:58   DG Chest 1 View  Result Date: 09/15/2019 CLINICAL DATA:  Altered mental status. EXAM: CHEST  1 VIEW COMPARISON:  November 21, 2017. FINDINGS: There is interval development of diffuse left lung opacity concerning for atelectasis or pneumonia with some  degree of  volume loss causing mild right to left mediastinal shift. No pneumothorax is noted. Possible small left pleural effusion is noted. The right lung is hyperexpanded but otherwise normal. Bony thorax is unremarkable. IMPRESSION: Interval development of diffuse left lung opacity concerning for atelectasis or pneumonia with some degree of volume loss causing mild right to left mediastinal shift. Possible small left pleural effusion. Aortic Atherosclerosis (ICD10-I70.0). Electronically Signed   By: Marijo Conception M.D.   On: 09/15/2019 15:01   CT HEAD WO CONTRAST  Result Date: 09/16/2019 CLINICAL DATA:  Altered mental status, unclear cause EXAM: CT HEAD WITHOUT CONTRAST TECHNIQUE: Contiguous axial images were obtained from the base of the skull through the vertex without intravenous contrast. COMPARISON:  None. FINDINGS: Brain: Hypoattenuating focus in the left external capsule likely reflects a remote lacunar infarct. No evidence of acute infarction, hemorrhage, hydrocephalus, extra-axial collection or mass lesion/mass effect. Symmetric prominence of the ventricles, cisterns and sulci compatible with parenchymal volume loss. Patchy areas of white matter hypoattenuation are most compatible with chronic microvascular angiopathy. Senescent mineralization seen in the basal ganglia. Vascular: Atherosclerotic calcification of the carotid siphons. No hyperdense vessel. Skull: No calvarial fracture or suspicious osseous lesion. No scalp swelling or hematoma. Sinuses/Orbits: Minimal thickening in the ethmoids. Remaining paranasal sinuses and mastoid air cells are predominantly clear without pneumatized secretions or air-fluid levels. Hypo pneumatization of the frontal sinuses. Included orbital structures are unremarkable. Other: None IMPRESSION: 1. 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 2. No definite acute intracranial findings. 3. Chronic microvascular angiopathy and parenchymal volume loss.  Electronically Signed   By: Lovena Le M.D.   On: 09/16/2019 00:11   DG Chest Port 1 View  Result Date: 09/15/2019 CLINICAL DATA:  Central line placement. EXAM: PORTABLE CHEST 1 VIEW COMPARISON:  September 15, 2019 (7:18 p.m.) FINDINGS: A left internal jugular venous catheter is seen. Its distal tip is noted within the distal aspect of the superior vena cava. This represents a new finding when compared to the prior study. There is stable endotracheal tube and nasogastric tube positioning. Mild, stable left lower lobe atelectasis and/or infiltrate is seen. No pleural effusion or pneumothorax is identified. The heart size and mediastinal contours are within normal limits. The visualized skeletal structures are unremarkable. IMPRESSION: 1. Interval left internal jugular venous catheter placement with its distal tip in the superior vena cava. 2. Stable left lower lobe atelectasis and/or infiltrate. Electronically Signed   By: Virgina Norfolk M.D.   On: 09/15/2019 23:10   DG Chest Port 1 View  Result Date: 09/15/2019 CLINICAL DATA:  Hypoxia EXAM: PORTABLE CHEST 1 VIEW COMPARISON:  09/15/2019 FINDINGS: Support devices are stable. Left lower lobe atelectasis or infiltrate with probable layering left effusion. Right lung clear. Mild hyperinflation. Heart is normal size. No acute bony abnormality. IMPRESSION: Hyperinflation. Continued left lower lobe atelectasis or infiltrate with suspected layering left effusion. Support devices stable. Electronically Signed   By: Rolm Baptise M.D.   On: 09/15/2019 19:33   DG Chest Portable 1 View  Result Date: 09/15/2019 CLINICAL DATA:  Intubated EXAM: PORTABLE CHEST 1 VIEW COMPARISON:  09/15/2019, 11/21/2017 FINDINGS: Interval intubation, tip of the endotracheal tube is about 2 cm superior to carina. Esophageal tube tip and side port overlie the mid gastric region. Right lung is clear. Mediastinal shift to the left consistent with volume loss. Diffuse opacity in the left thorax  likely due to combination of pleural effusion and airspace disease. IMPRESSION: 1. Endotracheal tube tip  about 2 cm superior to carina. Esophageal tube tip and side port overlie the mid stomach. 2. Volume loss in the left thorax with diffuse opacity in the left thorax, likely due to combination of pleural effusion and airspace disease. Central obstructing lesion is a concern, consider correlation with contrast-enhanced chest CT. Electronically Signed   By: Donavan Foil M.D.   On: 09/15/2019 15:40      Assessment & Plan: Pt is a 74 y.o. 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  September 12, 2019: Cystourethroscopy, ureteroscopy and resection of ureteral tumor by Dr. Kandee Keen at [facility].  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  Ureteral stent placed in the left collecting system under cystoscopy guidance  , was admitted on 09/15/2019 with Hyperkalemia [E87.5] Bradycardia [R00.1] Respiratory distress [R06.03] Acute on chronic respiratory failure with hypoxia (HCC) [J96.21] Aspiration pneumonia of left lung, unspecified aspiration pneumonia type, unspecified part of lung ([place]) [J69.0]  #Acute kidney injury Baseline creatinine of 1.6 from Aug 07, 2019 AKI likely multifactorial but likely due to hypotension from concurrent cardiac events Patient is currently nonoliguric and serum creatinine started to improve No acute indication for dialysis at present Lab Results  Component Value Date   CREATININE 2.14 (H) 09/16/2019    CREATININE 2.44 (H) 09/15/2019   CREATININE 2.45 (H) 09/15/2019    # Hyperkalemia Likely due to ACE inhibitor in the setting of acute kidney injury Patient is now nonoliguric Potassium has improved with shifting measures We will continue to monitor.  No acute indication for dialysis at present Hold Lisinopril  #Acute respiratory failure Ventilator assisted FiO2 70% Concern about aspiration pneumonia Management as per ICU team   #Left hydronephrosis, urothelial tumor Recent stent placement at [facility] on June 29 Repeat renal ultrasound results are pending     LOS: 1 Karey Stucki 7/3/20219:39 AM    Note: This note was prepared with Dragon dictation. Any transcription errors are unintentional

## 2019-09-16 NOTE — Progress Notes (Signed)
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  SIGNIFICANT EVENTS  7/2- Presented to ED 7/2- Transient loss of pulse and hypoxia on the vent requiring manual bag ventilation and CPR with ROSC 7/2- Started on Dopamine Gtt 7/3-Remains intubated and sedated 7/3- Seizure like activity noted overnight started on Keppra  STUDIES:  7/2- CXR>>interval development of diffuse left lung opacity concerning for atelectasis or pneumonia with some degree of volume loss causing mild right to left mediastinal shift 7/2- CT head>> No acute intracranial abnormality 7/3- US Renal>> Pending 7/3-US Venous LUE>> 7/3- EEG>> Pending  CULTURES: SARS-CoV-2 PCR 7/2>>  MRSA PCR 7/2>> Blood culture 7/2>  ANTIBIOTICS: Unasyn 7/2>  PAST MEDICAL HISTORY :   has a past medical history of Anxiety, Asthma, COPD (chronic obstructive pulmonary disease) ([place]), Depression, GERD (gastroesophageal reflux disease), and Hypertension.  has a past surgical history that includes Tubal ligation; Total hip arthroplasty (Left); and Abdominal hysterectomy.   Prior to Admission medications   Medication Sig Start Date End Date Taking? Authorizing Provider  acetaminophen (TYLENOL) 500 MG tablet Take 500-1,000 mg by mouth every  6 (six) hours as needed for moderate pain or fever.     [provider]  albuterol (ACCUNEB) 1.25 MG/3ML nebulizer solution Take 1 ampule by nebulization every 6 (six) hours as needed for wheezing or shortness of breath.    [provider]  albuterol (PROVENTIL HFA;VENTOLIN HFA) 108 (90 Base) MCG/ACT inhaler Inhale 2 puffs into the lungs every 6 (six) hours as needed for wheezing or shortness of breath.    [provider]  amLODipine (NORVASC) 10 MG tablet Take 10 mg by mouth daily.    [provider]  aspirin EC 81 MG EC tablet Take 1 tablet (81 mg total) by mouth daily. 03/27/16   Hillary Bow, MD  carvedilol (COREG) 12.5 MG tablet Take 1 tablet (12.5 mg total) by mouth 2 (two) times daily with a meal. 11/23/17   Mody, Sital, MD  Fluticasone-Salmeterol (ADVAIR) 250-50 MCG/DOSE AEPB Inhale 1 puff into the lungs every 12 (twelve) hours.     [provider]  ipratropium (ATROVENT HFA) 17 MCG/ACT inhaler Inhale 2 puffs into the lungs 3 (three) times daily.     [provider]  lisinopril (PRINIVIL,ZESTRIL) 40 MG tablet Take 40 mg by mouth daily.    [provider]  nicotine (NICODERM CQ - DOSED IN MG/24 HOURS) 21 mg/24hr patch Place 1 patch (21 mg total) onto the skin daily. 11/23/17   Bettey Costa, MD  omeprazole (PRILOSEC) 20 MG capsule Take 20 mg by mouth daily.    [provider]  pravastatin (PRAVACHOL) 20 MG tablet Take 20 mg by mouth every evening.     [provider]  ranitidine (ZANTAC) 150 MG capsule Take 150 mg by mouth 2 (two) times daily.    [provider]   Scheduled Meds: . chlorhexidine  gluconate (MEDLINE KIT)  15 mL Mouth Rinse BID  . Chlorhexidine Gluconate Cloth  6 each Topical Daily  . docusate  100 mg Oral BID  . insulin aspart  0-9 Units Subcutaneous Q4H  . mouth rinse  15 mL Mouth Rinse 10 times per day  . pantoprazole (PROTONIX) IV  40 mg Intravenous QHS  . patiromer  16.8 g Oral Daily    . polyethylene glycol  17 g Oral Daily   Continuous Infusions: . sodium chloride 10 mL/hr at 09/16/19 0448  . ampicillin-sulbactam (UNASYN) IV 200 mL/hr at 09/16/19 0600  . DOPamine 1 mcg/kg/min (09/16/19 0600)  . fentaNYL infusion INTRAVENOUS    . heparin 1,150 Units/hr (09/16/19 1110)  . levETIRAcetam 500 mg (09/16/19 0911)  . norepinephrine (LEVOPHED) Adult infusion Stopped (09/15/19 1815)  . propofol (DIPRIVAN) infusion 40 mcg/kg/min (09/16/19 1009)  . sodium bicarbonate 150 mEq in dextrose 5% 1000 mL 50 mL/hr at 09/16/19 0600   PRN Meds:.sodium chloride, docusate sodium, fentaNYL (SUBLIMAZE) injection, fentaNYL (SUBLIMAZE) injection, midazolam, midazolam, ondansetron (ZOFRAN) IV, polyethylene glycol   Allergies  Allergen Reactions  . Aspirin Other (See Comments)    GI ulcers  . Morphine And Related Other (See Comments)    Delerium, itching  . Nsaids Other (See Comments)    GI ulcers    FAMILY HISTORY:  family history includes Heart disease in her father and sister. SOCIAL HISTORY:  reports that she has been smoking. She has never used smokeless tobacco. She reports that she does not drink alcohol and does not use drugs.  REVIEW OF SYSTEMS:   UNABLE TO ASSESS DUE TO INTUBATION AND SEDATED  VITAL SIGNS: Temp:  [98 F (36.7 C)-100.3 F (37.9 C)] 99.5 F (37.5 C) (07/03 0700) Pulse Rate:  [38-121] 75 (07/03 0700) Resp:  [13-33] 20 (07/03 0700) BP: (47-251)/(34-160) 122/52 (07/03 0700) SpO2:  [69 %-100 %] 100 % (07/03 0700) FiO2 (%):  [60 %-100 %] 70 % (07/03 0734) Weight:  [69.9 kg-77.1 kg] 69.9 kg (07/02 1850)  PHYSICAL EXAMINATION: GENERAL: 74 year old patient lying in the bed on mechanical ventillation EYES:  No scleral icterus. Extraocular muscles intact.  HEENT: Head atraumatic, normocephalic. Oropharynx and nasopharynx clear.  NECK:  Supple, no jugular venous distention. No thyroid enlargement, no tenderness.  LUNGS: Decreased breath sounds bilaterally, no  wheezing, rales,rhonchi or crepitation. No use of accessory muscles of respiration.  CARDIOVASCULAR: S1, S2 normal. No murmurs, rubs, or gallops.  ABDOMEN: Soft, nontender, nondistended. Bowel sounds present. No organomegaly or mass.  EXTREMITIES: Generalized dependent edema, cyanosis, or clubbing.  FOCUSED NEUROLOGIC:Patient does not respond to verbal stimuli.  Does not respond to deep sternal rub.  Does not follow commands.  No verbalizations noted.  Cranial Nerves: II: patient does not respond confrontation bilaterally, pupils right 2 mm, left 2 mm,and minimally bilaterally III,IV,VI: doll's response absent bilaterally.  V,VII: corneal reflex absent bilaterally  VIII: patient does not respond to verbal stimuli IX,X: gag reflex reduced, XI: trapezius strength unable to test bilaterally XII: tongue strength unable to test Motor: Extremities flaccid throughout.  No spontaneous movement noted.  No purposeful movements noted. Sensory: Does not respond to noxious stimuli in any extremity. Deep Tendon Reflexes:  Absent throughout. Plantars: absent bilaterally Cerebellar: Unable to perform PSYCHIATRIC: Unable to assess.  SKIN: No obvious rash, lesion, or ulcer.   Recent Labs  Lab 09/15/19 1707 09/15/19 2325 09/16/19 0450  NA 136 135 135  K 6.1* 5.4* 5.1  CL 106 100 99  CO2 21* 26 27  BUN 50* 58* 57*  CREATININE 2.45* 2.44* 2.14*  GLUCOSE 167* 160* 141*   Recent Labs  Lab 09/15/19 1513 09/16/19 0450  HGB 10.7* 10.0*  HCT 31.6* 28.4*  WBC 15.0* 16.0*  PLT 696* 439*   DG Chest 1 View  Result Date: 09/15/2019 CLINICAL DATA:  Deoxygenation. EXAM: CHEST  1 VIEW COMPARISON:  September 15, 2019 FINDINGS: There is stable endotracheal tube and nasogastric tube positioning. Mild left basilar atelectasis and/or infiltrate is seen. This is decreased in severity when compared to the prior study. A very small left pleural effusion is also seen. This is also decreased in size. There is mild  elevation of the left hemidiaphragm. No pneumothorax is identified. The heart size and mediastinal contours are within normal limits. There is marked severity calcification of the aortic arch. The visualized skeletal structures are unremarkable. The proximal end of a left-sided endo ureteral stent is seen within the visualized portion of the abdomen. IMPRESSION: Mild left basilar atelectasis and/or infiltrate, decreased in size when compared to the prior study. Electronically Signed   By: Virgina Norfolk M.D.   On: 09/15/2019 17:58   DG Chest 1 View  Result Date: 09/15/2019 CLINICAL DATA:  Altered mental status. EXAM: CHEST  1 VIEW COMPARISON:  November 21, 2017. FINDINGS: There is interval development of diffuse left lung opacity concerning for atelectasis or pneumonia with some degree of volume loss causing mild right to left mediastinal shift. No pneumothorax is noted. Possible small left pleural effusion is noted. The right lung is hyperexpanded but otherwise normal. Bony thorax is unremarkable. IMPRESSION: Interval development of diffuse left lung opacity concerning for atelectasis or pneumonia with some degree of volume loss causing mild right to left mediastinal shift. Possible small left pleural effusion. Aortic Atherosclerosis (ICD10-I70.0). Electronically Signed   By: Marijo Conception M.D.   On: 09/15/2019 15:01   CT HEAD WO CONTRAST  Result Date: 09/16/2019 CLINICAL DATA:  Altered mental status, unclear cause EXAM: CT HEAD WITHOUT CONTRAST TECHNIQUE: Contiguous axial images were obtained from the base of the skull through the vertex without intravenous contrast. COMPARISON:  None. FINDINGS: Brain: Hypoattenuating focus in the left external capsule likely reflects a remote lacunar infarct. No evidence of acute infarction, hemorrhage, hydrocephalus, extra-axial collection or mass lesion/mass effect. Symmetric prominence of the ventricles, cisterns and sulci compatible with parenchymal volume loss.  Patchy areas of white matter hypoattenuation are most compatible with chronic microvascular angiopathy. Senescent mineralization seen in the basal ganglia. Vascular: Atherosclerotic calcification of the carotid siphons. No hyperdense vessel. Skull: No calvarial fracture or suspicious osseous lesion. No scalp swelling or hematoma. Sinuses/Orbits: Minimal thickening in the ethmoids. Remaining paranasal sinuses and mastoid air cells are predominantly clear without pneumatized secretions or air-fluid levels. Hypo pneumatization of the frontal sinuses. Included orbital structures are unremarkable. Other: None IMPRESSION: 1. 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 2. No definite acute intracranial findings. 3. Chronic microvascular angiopathy and parenchymal volume loss. Electronically Signed   By: Lovena Le M.D.   On: 09/16/2019 00:11   DG Chest Port 1 View  Result Date: 09/15/2019 CLINICAL DATA:  Central line placement. EXAM: PORTABLE CHEST 1 VIEW COMPARISON:  September 15, 2019 (7:18 p.m.) FINDINGS: A left internal jugular venous catheter is seen. Its distal tip is noted within the distal aspect of the superior vena cava. This represents a new finding when compared to the prior study. There is stable endotracheal tube and nasogastric tube positioning. Mild, stable left lower lobe  atelectasis and/or infiltrate is seen. No pleural effusion or pneumothorax is identified. The heart size and mediastinal contours are within normal limits. The visualized skeletal structures are unremarkable. IMPRESSION: 1. Interval left internal jugular venous catheter placement with its distal tip in the superior vena cava. 2. Stable left lower lobe atelectasis and/or infiltrate. Electronically Signed   By: Virgina Norfolk M.D.   On: 09/15/2019 23:10   DG Chest Port 1 View  Result Date: 09/15/2019 CLINICAL DATA:  Hypoxia EXAM: PORTABLE CHEST 1 VIEW COMPARISON:  09/15/2019 FINDINGS: Support devices are stable. Left  lower lobe atelectasis or infiltrate with probable layering left effusion. Right lung clear. Mild hyperinflation. Heart is normal size. No acute bony abnormality. IMPRESSION: Hyperinflation. Continued left lower lobe atelectasis or infiltrate with suspected layering left effusion. Support devices stable. Electronically Signed   By: Rolm Baptise M.D.   On: 09/15/2019 19:33   DG Chest Portable 1 View  Result Date: 09/15/2019 CLINICAL DATA:  Intubated EXAM: PORTABLE CHEST 1 VIEW COMPARISON:  09/15/2019, 11/21/2017 FINDINGS: Interval intubation, tip of the endotracheal tube is about 2 cm superior to carina. Esophageal tube tip and side port overlie the mid gastric region. Right lung is clear. Mediastinal shift to the left consistent with volume loss. Diffuse opacity in the left thorax likely due to combination of pleural effusion and airspace disease. IMPRESSION: 1. Endotracheal tube tip about 2 cm superior to carina. Esophageal tube tip and side port overlie the mid stomach. 2. Volume loss in the left thorax with diffuse opacity in the left thorax, likely due to combination of pleural effusion and airspace disease. Central obstructing lesion is a concern, consider correlation with contrast-enhanced chest CT. Electronically Signed   By: Donavan Foil M.D.   On: 09/15/2019 15:40   ECHOCARDIOGRAM COMPLETE  Result Date: 09/16/2019    ECHOCARDIOGRAM REPORT   Patient Name:   Pamela James Date of Exam: 09/16/2019 Medical Rec #:  [id number]  Height:       66.0 in Accession #:    [id number] Weight:       154.1 lb Date of Birth:  [birth year]   BSA:          1.790 m Patient Age:    71 years   BP:           122/52 mmHg Patient Gender: F          HR:           74 bpm. 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  Sonographer:     Arville Go RDCS Referring Phys:  [id number] Bradly Bienenstock Diagnosing Phys: Yolonda Kida MD  Sonographer Comments: Echo performed with patient supine and on artificial respirator. IMPRESSIONS  1. Left ventricular ejection fraction, by estimation, is 60 to 65%. The left ventricle has normal function. The left ventricle has no regional wall motion abnormalities. Left ventricular diastolic parameters were normal.  2. Right ventricular systolic function is normal. The right ventricular size is normal.  3. The mitral valve is grossly normal. No evidence of mitral valve regurgitation.  4. The aortic valve is grossly normal. Aortic valve regurgitation is not visualized. FINDINGS  Left Ventricle: Left ventricular ejection fraction, by estimation, is 60 to 65%.  The left ventricle has normal function. The left ventricle has no regional wall motion abnormalities. The left ventricular internal cavity size was normal in size. There is  borderline concentric left ventricular hypertrophy. Left ventricular diastolic parameters were normal. Right Ventricle: The right ventricular size is normal. No increase in right ventricular wall thickness. Right ventricular systolic function is normal. Left Atrium: Left atrial size was normal in size. Right Atrium: Right atrial size was normal in size. Pericardium: There is no evidence of pericardial effusion. Mitral Valve: The mitral valve is grossly normal. There is mild prolapse of of the mitral valve. There is mild thickening of the mitral valve leaflet(s). Normal mobility of the mitral valve leaflets. No evidence of mitral valve regurgitation. Tricuspid Valve: The tricuspid valve is normal in structure. Tricuspid valve regurgitation is trivial. Aortic Valve: The aortic valve is grossly normal. Aortic valve regurgitation is not visualized. Aortic valve peak gradient measures 4.4 mmHg. Pulmonic Valve: The pulmonic valve was grossly normal. Pulmonic valve regurgitation is not visualized. Aorta: The aortic root is normal in size and  structure. IAS/Shunts: No atrial level shunt detected by color flow Doppler.  LEFT VENTRICLE PLAX 2D LVIDd:         3.91 cm  Diastology LVIDs:         2.69 cm  LV e' lateral:   7.29 cm/s LV PW:         1.09 cm  LV E/e' lateral: 7.2 LV IVS:        1.11 cm  LV e' medial:    5.33 cm/s LVOT diam:     2.00 cm  LV E/e' medial:  9.8 LV SV:         46 LV SV Index:   26 LVOT Area:     3.14 cm  RIGHT VENTRICLE RV Basal diam:  2.97 cm TAPSE (M-mode): 1.1 cm LEFT ATRIUM             Index       RIGHT ATRIUM          Index LA diam:        3.40 cm 1.90 cm/m  RA Area:     8.09 cm LA Vol (A2C):   16.3 ml 9.11 ml/m  RA Volume:   16.80 ml 9.39 ml/m LA Vol (A4C):   20.1 ml 11.23 ml/m LA Biplane Vol: 18.9 ml 10.56 ml/m  AORTIC VALVE                PULMONIC VALVE AV Area (Vmax): 2.05 cm    PV Vmax:       0.92 m/s AV Vmax:        105.00 cm/s PV Peak grad:  3.4 mmHg AV Peak Grad:   4.4 mmHg LVOT Vmax:      68.50 cm/s LVOT Vmean:     51.900 cm/s LVOT VTI:       0.146 m  AORTA Ao Root diam: 2.70 cm MITRAL VALVE               TRICUSPID VALVE MV Area (PHT): 2.73 cm    TV Peak grad:   29.8 mmHg MV Decel Time: 278 msec    TV Vmax:        2.73 m/s MV E velocity: 52.30 cm/s MV A velocity: 47.10 cm/s  SHUNTS MV E/A ratio:  1.11        Systemic VTI:  0.15 m  Systemic Diam: 2.00 cm Yolonda Kida MD Electronically signed by Yolonda Kida MD Signature Date/Time: 09/16/2019/11:11:40 AM    Final     Date: 09/15/2019 EKG Time: 14:36 Rate: 74 Rhythm: 2nd degree heart block vs 1st degree block with PAC's Axis: Normal Intervals:none ST&T Change: peaked T waves   ASSESSMENT / PLAN:  Acute Hypoxic  Respiratory Failure secondary to Aspiration Pneumonia? COPD without evidence of acute exacerbation -Full vent support for now-vent settings reviewed and established  -SBT once all parameters met  -VAP bundle implemented -Trend WBC and monitor fever curve  -Follow intermittent ABG and chest x-ray as  needed -Continue Unasyn -As needed bronchodilators -Encourage smoking cessation  Significant Bradycardia - Secondary to Hyperkalemia? S/p transient loss of pulse with ROSC -EKG shows questionable Junctional rhythm/slow afib? -Now ff Dopamine gtt -Aggressive electrolyte replacement -Cardiology input appreciated  Acute Metabolic Encephalopathy - secondary to severe metabolic derangement -Treat underlying medical issues -CT head negative for acute intracranial abnormality  Myoclonic Jerking - concerns of seizure activity -EEG pending -Ativan PRN for seizures -Seizure precaution -Continue Keppra 500 mg BID -Neurology input appreciated  Acute Kidney Injury Severe Hyperkalemia on presentation >7.5 now improved 5.1 -Monitor I&O's / urinary output -Follow BMP -Ensure adequate renal perfusion -Avoid nephrotoxic agents as able -Replace electrolytes as indicated -Nephrology consult  Left Ureteral tumor Hx: Left hydronephrosis s/p  LEFT Cystourethroscopy, with Ureteroscopy And/Or Pyeloscopy; 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 Discussed goals of care with patient's son at the bedside    Rufina Falco. DNP, CCRN, FNP-BC [place]    09/16/2019, 11:34 AM

## 2019-09-16 NOTE — Consult Note (Addendum)
Reason for Consult: AMS Requesting Physician: Dr. Duwayne Heck   CC: AMS   HPI: Pamela James is an 74 y.o. 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 Pt was intubated in ED with suspected brief cardiac arrest likely due to hypoxia as O2 of 35 % on admission. Pt was found to have subclavian clot and is on heparin.     Past Medical History:  Diagnosis Date  . Anxiety   . Asthma   . COPD (chronic obstructive pulmonary disease) ([place])   . Depression   . GERD (gastroesophageal reflux disease)   . Hypertension     Past Surgical History:  Procedure Laterality Date  . ABDOMINAL HYSTERECTOMY    . TOTAL HIP ARTHROPLASTY Left   . TUBAL LIGATION      Family History  Problem Relation Age of Onset  . Heart disease Father   . Heart disease Sister     Social History:  reports that she has been smoking. She has never used smokeless tobacco. She reports that she does not drink alcohol and does not use drugs.  Allergies  Allergen Reactions  . Aspirin Other (See Comments)    GI ulcers  . Morphine And Related Other (See Comments)    Delerium, itching  . Nsaids Other (See Comments)    GI ulcers    Medications:  I have reviewed the patient's current medications. Prior to Admission:  Medications Prior to Admission  Medication Sig Dispense Refill Last Dose  . albuterol (PROVENTIL HFA;VENTOLIN HFA) 108 (90 Base) MCG/ACT inhaler Inhale 2 puffs into the lungs every 6 (six) hours as needed for wheezing or shortness of breath.   Unknown at PRN  . albuterol (PROVENTIL) (2.5 MG/3ML) 0.083% nebulizer solution  Take 2.5 mg by nebulization every 6 (six) hours as needed for wheezing or shortness of breath.   Unknown at PRN  . alendronate (FOSAMAX) 70 MG tablet Take 70 mg by mouth once a week. Take with a full glass of water on an empty stomach.     Marland Kitchen amLODipine (NORVASC) 10 MG tablet Take 10 mg by mouth daily.   12-24 hours at Unknown  . aspirin EC 81 MG EC tablet Take 1 tablet (81 mg total) by mouth daily.   12-24 hours at Unknown  . carvedilol (COREG) 12.5 MG tablet Take 1 tablet (12.5 mg total) by mouth 2 (two) times daily with a meal. 60 tablet 0 12-24 hours at Unknown  . diphenhydrAMINE (BENADRYL) 25 mg capsule Take 25 mg by mouth every 6 (six) hours as needed for itching or allergies.   Unknown at PRN  . fluticasone-salmeterol (ADVAIR HFA) 230-21 MCG/ACT inhaler Inhale 2 puffs into the lungs 2 (two) times daily.   12-24 hours at Unknown  . gabapentin (NEURONTIN) 300 MG capsule Take 300 mg by mouth 2 (two) times daily.    12-24 hours at Unknown  . hydrALAZINE (APRESOLINE) 25 MG tablet Take 25 mg by mouth in the morning and at bedtime.   12-24 hours at Unknown  . Ipratropium-Albuterol (COMBIVENT) 20-100 MCG/ACT AERS respimat Inhale 1 puff into the lungs 4 (four) times daily.   12-24 hours at Unknown  . lisinopril (  PRINIVIL,ZESTRIL) 40 MG tablet Take 40 mg by mouth daily.   12-24 hours at Unknown  . omeprazole (PRILOSEC) 20 MG capsule Take 20 mg by mouth daily.   12-24 hours at Unknown  . ondansetron (ZOFRAN-ODT) 4 MG disintegrating tablet Take 4 mg by mouth every 8 (eight) hours as needed for nausea or vomiting.   Unknown at PRN  . pravastatin (PRAVACHOL) 20 MG tablet Take 20 mg by mouth at bedtime.    12-24 hours at Unknown  . tamsulosin (FLOMAX) 0.4 MG CAPS capsule Take 0.4 mg by mouth daily.   12-24 hours at Unknown  . vitamin B-12 (CYANOCOBALAMIN) 1000 MCG tablet Take 1,000 mcg by mouth daily.       ROS: Unable to access as intubated  Physical Examination: Blood pressure (!) 122/52, pulse 75,  temperature 99.5 F (37.5 C), resp. rate 20, height 5\' 6"  (1.676 m), weight 69.9 kg, SpO2 100 %.    Neurological Examination   Mental Status: Intubated. Not following commands.   Cranial Nerves: Sluggish pupils IX,X: gag reflex present Motor: No movement to painful stimuli but pt is sedated.   Sensory: Pinprick and light touch intact throughout, bilaterally    Laboratory Studies:   Basic Metabolic Panel: Recent Labs  Lab 09/15/19 1513 09/15/19 1513 09/15/19 1707 09/15/19 1725 09/15/19 2325 09/16/19 0450  NA 132*  --  136  --  135 135  K >7.5*  --  6.1*  --  5.4* 5.1  CL 100  --  106  --  100 99  CO2 24  --  21*  --  26 27  GLUCOSE 129*  --  167*  --  160* 141*  BUN 54*  --  50*  --  58* 57*  CREATININE 2.64*  --  2.45*  --  2.44* 2.14*  CALCIUM 8.6*   < > 8.4*  --  9.5 9.4  MG  --   --   --  1.9  --  2.0  PHOS  --   --   --  4.3  --  5.0*   < > = values in this interval not displayed.    Liver Function Tests: Recent Labs  Lab 09/15/19 1513  AST 40  ALT 40  ALKPHOS 61  BILITOT 0.7  PROT 6.8  ALBUMIN 3.7   Recent Labs  Lab 09/15/19 1725  LIPASE 29  AMYLASE 81   No results for input(s): AMMONIA in the last 168 hours.  CBC: Recent Labs  Lab 09/15/19 1513 09/16/19 0450  WBC 15.0* 16.0*  NEUTROABS 10.5*  --   HGB 10.7* 10.0*  HCT 31.6* 28.4*  MCV 96.9 92.8  PLT 696* 439*    Cardiac Enzymes: No results for input(s): CKTOTAL, CKMB, CKMBINDEX, TROPONINI in the last 168 hours.  BNP: Invalid input(s): POCBNP  CBG: Recent Labs  Lab 09/15/19 1435 09/15/19 1710 09/16/19 0422 09/16/19 0823  GLUCAP 100* 191* 118* 125*    Microbiology: Results for orders placed or performed during the hospital encounter of 09/15/19  Culture, blood (routine x 2)     Status: None (Preliminary result)   Collection Time: 09/15/19  3:13 PM   Specimen: BLOOD  Result Value Ref Range Status   Specimen Description BLOOD BLOOD LEFT HAND  Final   Special Requests    Final    BOTTLES DRAWN AEROBIC AND ANAEROBIC Blood Culture results may not be optimal due to an excessive volume of blood received in culture bottles   Culture  Setup  Time   Final    Organism ID to follow GRAM POSITIVE COCCI ANAEROBIC BOTTLE ONLY CRITICAL RESULT CALLED TO, READ BACK BY AND VERIFIED WITH: Performed at [facility], [address] [place], [place] [address]    Culture [facility] POSITIVE COCCI  Final   Report Status PENDING  Incomplete  Culture, blood (routine x 2)     Status: None (Preliminary result)   Collection Time: 09/15/19  3:50 PM   Specimen: BLOOD  Result Value Ref Range Status   Specimen Description BLOOD LEFT ARM  Final   Special Requests   Final    BOTTLES DRAWN AEROBIC AND ANAEROBIC Blood Culture adequate volume   Culture   Final    NO GROWTH < 24 HOURS Performed at [facility], [address] [place], [place] [address]    Report Status PENDING  Incomplete  SARS Coronavirus 2 by RT PCR (hospital order, performed in [place] hospital lab) Nasopharyngeal Nasopharyngeal Swab     Status: None   Collection Time: 09/15/19  3:55 PM   Specimen: Nasopharyngeal Swab  Result Value Ref Range Status   SARS Coronavirus 2 NEGATIVE NEGATIVE Final    Comment: (NOTE) SARS-CoV-2 target nucleic acids are NOT DETECTED.  The SARS-CoV-2 RNA is generally detectable in upper and lower respiratory specimens during the acute phase of infection. The lowest concentration of SARS-CoV-2 viral copies this assay can detect is 250 copies / mL. A negative result does not preclude SARS-CoV-2 infection and should not be used as the sole basis for treatment or other patient management decisions.  A negative result may occur with improper specimen collection / handling, submission of specimen other than nasopharyngeal swab, presence of viral mutation(s) within the areas targeted by this assay, and inadequate number of viral copies (<250 copies / mL). A negative result  must be combined with clinical observations, patient history, and epidemiological information.  Fact Sheet for Patients:   StrictlyIdeas.no  Fact Sheet for Healthcare Providers: BankingDealers.co.za  This test is not yet approved or  cleared by the [place] FDA and has been authorized for detection and/or diagnosis of SARS-CoV-2 by FDA under an Emergency Use Authorization (EUA).  This EUA will remain in effect (meaning this test can be used) for the duration of the COVID-19 declaration under Section 564(b)(1) of the Act, 21 U.S.C. section 360bbb-3(b)(1), unless the authorization is terminated or revoked sooner.  Performed at [facility], [address] [place], [place] [address]   MRSA PCR Screening     Status: None   Collection Time: 09/15/19  6:51 PM   Specimen: Nasal Mucosa; Nasopharyngeal  Result Value Ref Range Status   MRSA by PCR NEGATIVE NEGATIVE Final    Comment:        The GeneXpert MRSA Assay (FDA approved for NASAL specimens only), is one component of a comprehensive MRSA colonization surveillance program. It is not intended to diagnose MRSA infection nor to guide or monitor treatment for MRSA infections. Performed at [facility], [address] [place], [place] [address]     Coagulation Studies: Recent Labs    09/15/19 June 07, 2010  LABPROT 13.9  INR 1.1    Urinalysis:  Recent Labs  Lab 09/15/19 1811  COLORURINE AMBER*  LABSPEC 1.018  PHURINE 5.0  GLUCOSEU NEGATIVE  HGBUR LARGE*  BILIRUBINUR NEGATIVE  KETONESUR 5*  PROTEINUR 100*  NITRITE NEGATIVE  LEUKOCYTESUR TRACE*    Lipid Panel:     Component Value Date/Time   CHOL 139  03/25/2016 0341   TRIG 117 09/15/2019 2325   HDL 46 03/25/2016 0341   CHOLHDL 3.0 03/25/2016 0341   VLDL 17 03/25/2016 0341   LDLCALC 76 03/25/2016 0341    HgbA1C:  Lab Results  Component Value Date   HGBA1C 5.4 09/16/2019    Urine Drug  Screen:  No results found for: LABOPIA, COCAINSCRNUR, LABBENZ, AMPHETMU, THCU, LABBARB  Alcohol Level: No results for input(s): ETH in the last 168 hours.  Imaging: DG Chest 1 View  Result Date: 09/15/2019 CLINICAL DATA:  Deoxygenation. EXAM: CHEST  1 VIEW COMPARISON:  September 15, 2019 FINDINGS: There is stable endotracheal tube and nasogastric tube positioning. Mild left basilar atelectasis and/or infiltrate is seen. This is decreased in severity when compared to the prior study. A very small left pleural effusion is also seen. This is also decreased in size. There is mild elevation of the left hemidiaphragm. No pneumothorax is identified. The heart size and mediastinal contours are within normal limits. There is marked severity calcification of the aortic arch. The visualized skeletal structures are unremarkable. The proximal end of a left-sided endo ureteral stent is seen within the visualized portion of the abdomen. IMPRESSION: Mild left basilar atelectasis and/or infiltrate, decreased in size when compared to the prior study. Electronically Signed   By: Virgina Norfolk M.D.   On: 09/15/2019 17:58   DG Chest 1 View  Result Date: 09/15/2019 CLINICAL DATA:  Altered mental status. EXAM: CHEST  1 VIEW COMPARISON:  November 21, 2017. FINDINGS: There is interval development of diffuse left lung opacity concerning for atelectasis or pneumonia with some degree of volume loss causing mild right to left mediastinal shift. No pneumothorax is noted. Possible small left pleural effusion is noted. The right lung is hyperexpanded but otherwise normal. Bony thorax is unremarkable. IMPRESSION: Interval development of diffuse left lung opacity concerning for atelectasis or pneumonia with some degree of volume loss causing mild right to left mediastinal shift. Possible small left pleural effusion. Aortic Atherosclerosis (ICD10-I70.0). Electronically Signed   By: Marijo Conception M.D.   On: 09/15/2019 15:01   CT HEAD WO  CONTRAST  Result Date: 09/16/2019 CLINICAL DATA:  Altered mental status, unclear cause EXAM: CT HEAD WITHOUT CONTRAST TECHNIQUE: Contiguous axial images were obtained from the base of the skull through the vertex without intravenous contrast. COMPARISON:  None. FINDINGS: Brain: Hypoattenuating focus in the left external capsule likely reflects a remote lacunar infarct. No evidence of acute infarction, hemorrhage, hydrocephalus, extra-axial collection or mass lesion/mass effect. Symmetric prominence of the ventricles, cisterns and sulci compatible with parenchymal volume loss. Patchy areas of white matter hypoattenuation are most compatible with chronic microvascular angiopathy. Senescent mineralization seen in the basal ganglia. Vascular: Atherosclerotic calcification of the carotid siphons. No hyperdense vessel. Skull: No calvarial fracture or suspicious osseous lesion. No scalp swelling or hematoma. Sinuses/Orbits: Minimal thickening in the ethmoids. Remaining paranasal sinuses and mastoid air cells are predominantly clear without pneumatized secretions or air-fluid levels. Hypo pneumatization of the frontal sinuses. Included orbital structures are unremarkable. Other: None IMPRESSION: 1. 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 2. No definite acute intracranial findings. 3. Chronic microvascular angiopathy and parenchymal volume loss. Electronically Signed   By: Lovena Le M.D.   On: 09/16/2019 00:11   DG Chest Port 1 View  Result Date: 09/15/2019 CLINICAL DATA:  Central line placement. EXAM: PORTABLE CHEST 1 VIEW COMPARISON:  September 15, 2019 (7:18 p.m.) FINDINGS: A left internal jugular venous catheter is seen. Its  distal tip is noted within the distal aspect of the superior vena cava. This represents a new finding when compared to the prior study. There is stable endotracheal tube and nasogastric tube positioning. Mild, stable left lower lobe atelectasis and/or infiltrate is seen. No  pleural effusion or pneumothorax is identified. The heart size and mediastinal contours are within normal limits. The visualized skeletal structures are unremarkable. IMPRESSION: 1. Interval left internal jugular venous catheter placement with its distal tip in the superior vena cava. 2. Stable left lower lobe atelectasis and/or infiltrate. Electronically Signed   By: Virgina Norfolk M.D.   On: 09/15/2019 23:10   DG Chest Port 1 View  Result Date: 09/15/2019 CLINICAL DATA:  Hypoxia EXAM: PORTABLE CHEST 1 VIEW COMPARISON:  09/15/2019 FINDINGS: Support devices are stable. Left lower lobe atelectasis or infiltrate with probable layering left effusion. Right lung clear. Mild hyperinflation. Heart is normal size. No acute bony abnormality. IMPRESSION: Hyperinflation. Continued left lower lobe atelectasis or infiltrate with suspected layering left effusion. Support devices stable. Electronically Signed   By: Rolm Baptise M.D.   On: 09/15/2019 19:33   DG Chest Portable 1 View  Result Date: 09/15/2019 CLINICAL DATA:  Intubated EXAM: PORTABLE CHEST 1 VIEW COMPARISON:  09/15/2019, 11/21/2017 FINDINGS: Interval intubation, tip of the endotracheal tube is about 2 cm superior to carina. Esophageal tube tip and side port overlie the mid gastric region. Right lung is clear. Mediastinal shift to the left consistent with volume loss. Diffuse opacity in the left thorax likely due to combination of pleural effusion and airspace disease. IMPRESSION: 1. Endotracheal tube tip about 2 cm superior to carina. Esophageal tube tip and side port overlie the mid stomach. 2. Volume loss in the left thorax with diffuse opacity in the left thorax, likely due to combination of pleural effusion and airspace disease. Central obstructing lesion is a concern, consider correlation with contrast-enhanced chest CT. Electronically Signed   By: Donavan Foil M.D.   On: 09/15/2019 15:40   ECHOCARDIOGRAM COMPLETE  Result Date: 09/16/2019     ECHOCARDIOGRAM REPORT   Patient Name:   Pamela James Date of Exam: 09/16/2019 Medical Rec #:  [id number]  Height:       66.0 in Accession #:    [id number] Weight:       154.1 lb Date of Birth:  14-Jul-1945   BSA:          1.790 m Patient Age:    9 years   BP:           122/52 mmHg Patient Gender: F          HR:           74 bpm. 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  Sonographer:     Arville Go RDCS Referring Phys:  [id number] Bradly Bienenstock Diagnosing Phys: Yolonda Kida MD  Sonographer Comments: Echo performed with patient supine and on artificial respirator. IMPRESSIONS  1. Left ventricular ejection fraction, by estimation, is 60 to 65%. The left ventricle has normal function. The left ventricle has no regional wall motion abnormalities. Left ventricular diastolic parameters were normal.  2. Right ventricular systolic function is normal. The right ventricular size is normal.  3. The mitral  valve is grossly normal. No evidence of mitral valve regurgitation.  4. The aortic valve is grossly normal. Aortic valve regurgitation is not visualized. FINDINGS  Left Ventricle: Left ventricular ejection fraction, by estimation, is 60 to 65%. The left ventricle has normal function. The left ventricle has no regional wall motion abnormalities. The left ventricular internal cavity size was normal in size. There is  borderline concentric left ventricular hypertrophy. Left ventricular diastolic parameters were normal. Right Ventricle: The right ventricular size is normal. No increase in right ventricular wall thickness. Right ventricular systolic function is normal. Left Atrium: Left atrial size was normal in size. Right Atrium: Right atrial size was normal in size. Pericardium: There is no evidence of pericardial effusion. Mitral Valve: The mitral valve is grossly normal.  There is mild prolapse of of the mitral valve. There is mild thickening of the mitral valve leaflet(s). Normal mobility of the mitral valve leaflets. No evidence of mitral valve regurgitation. Tricuspid Valve: The tricuspid valve is normal in structure. Tricuspid valve regurgitation is trivial. Aortic Valve: The aortic valve is grossly normal. Aortic valve regurgitation is not visualized. Aortic valve peak gradient measures 4.4 mmHg. Pulmonic Valve: The pulmonic valve was grossly normal. Pulmonic valve regurgitation is not visualized. Aorta: The aortic root is normal in size and structure. IAS/Shunts: No atrial level shunt detected by color flow Doppler.  LEFT VENTRICLE PLAX 2D LVIDd:         3.91 cm  Diastology LVIDs:         2.69 cm  LV e' lateral:   7.29 cm/s LV PW:         1.09 cm  LV E/e' lateral: 7.2 LV IVS:        1.11 cm  LV e' medial:    5.33 cm/s LVOT diam:     2.00 cm  LV E/e' medial:  9.8 LV SV:         46 LV SV Index:   26 LVOT Area:     3.14 cm  RIGHT VENTRICLE RV Basal diam:  2.97 cm TAPSE (M-mode): 1.1 cm LEFT ATRIUM             Index       RIGHT ATRIUM          Index LA diam:        3.40 cm 1.90 cm/m  RA Area:     8.09 cm LA Vol (A2C):   16.3 ml 9.11 ml/m  RA Volume:   16.80 ml 9.39 ml/m LA Vol (A4C):   20.1 ml 11.23 ml/m LA Biplane Vol: 18.9 ml 10.56 ml/m  AORTIC VALVE                PULMONIC VALVE AV Area (Vmax): 2.05 cm    PV Vmax:       0.92 m/s AV Vmax:        105.00 cm/s PV Peak grad:  3.4 mmHg AV Peak Grad:   4.4 mmHg LVOT Vmax:      68.50 cm/s LVOT Vmean:     51.900 cm/s LVOT VTI:       0.146 m  AORTA Ao Root diam: 2.70 cm MITRAL VALVE               TRICUSPID VALVE MV Area (PHT): 2.73 cm    TV Peak grad:   29.8 mmHg MV Decel Time: 278 msec    TV Vmax:        2.73 m/s MV E velocity: 52.30  cm/s MV A velocity: 47.10 cm/s  SHUNTS MV E/A ratio:  1.11        Systemic VTI:  0.15 m                            Systemic Diam: 2.00 cm Yolonda Kida MD Electronically signed by Yolonda Kida MD Signature Date/Time: 09/16/2019/11:11:40 AM    Final      Assessment/Plan:  74 y.o. 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 Pt was intubated in ED with suspected brief cardiac arrest likely due to hypoxia as O2 of 35 % on admission. Pt was found to have subclavian clot and is on heparin.   - Not following commands - symptoms likely metabolic as pt is sedated on propofol at 45. Currently on Vent at 70 FiO2 and 12 PEEP. - antibiotics as primary team - EEG ordered.  - as cardiac arrest was brief and in setting of hypoxia difficult to prognosticate now given the examination.  - CTH with possible L lacunar infarct but probably can be better evaluated once hypoxia improves or MRI which will unlikely change management at this time - ASA rectal - will follow 09/16/2019, 11:40 AM

## 2019-09-16 NOTE — Procedures (Signed)
Date of Study: 09/16/2019  Reason for Study: Pt is a 59 YOF with PMH of HTN, HLD, COPD/ASTHMA, MGUS, ANXIETY/DEPRESSION, BIPOLAR D/O, hx of gross hematuria and left hydronephrosis, s/p left ureteroscopy 5/27/2,  malignant tumor of the left ureter who had acute hypoxic respiratory failure and AMS. Eval for seizures.   Description of recording:  This recording was obtained using a Digital EEG system with 18 channel capacity . Standard bipolar and referential EEG montages were used following the International  10 - 20  System .   This is a digitally recorded EEG with duration of approximately ~21:17 minutes. The background consists of 7-8 hz alpha rhythm which attenuates with eye opening and closure.  There were no epileptiform discharges noted.  There were no recorded electrographic seizures. Hyperventilation was not performed. Photic stimulation was not performed. Sleep did not occur in this recording.    IMPRESSION: This is a normal EEG for age. There were no clinical seizures or epileptiform discharges noted during study.  CLINICAL CORRELATION: This EEG finding did not support the diagnosis of seizure.  Clinical correlation is advised. A normal EEG does not rule out epilepsy, which is a clinical diagnosis.  I have personally reviewed this study.

## 2019-09-16 NOTE — Progress Notes (Signed)
PHARMACY - PHYSICIAN COMMUNICATION CRITICAL VALUE ALERT - BLOOD CULTURE IDENTIFICATION (BCID)  Pamela James is an 74 y.o. female who presented to [facility] on 09/15/2019 with a chief complaint of AMS  Assessment:  Patient s/p CPR with ROSC. Intubated and sedated in ICU. BCx 1 of 4 GPC. BCID: staph species.   Name of physician (or Provider) Contacted: Dr. Patsey Berthold   Current antibiotics: Unasyn   Changes to prescribed antibiotics recommended:  No changes at this time.   Results for orders placed or performed during the hospital encounter of 09/15/19  Blood Culture ID Panel (Reflexed) (Collected: 09/15/2019  3:13 PM)  Result Value Ref Range   Enterococcus species NOT DETECTED NOT DETECTED   Listeria monocytogenes NOT DETECTED NOT DETECTED   Staphylococcus species DETECTED (A) NOT DETECTED   Staphylococcus aureus (BCID) NOT DETECTED NOT DETECTED   Methicillin resistance NOT DETECTED NOT DETECTED   Streptococcus species NOT DETECTED NOT DETECTED   Streptococcus agalactiae NOT DETECTED NOT DETECTED   Streptococcus pneumoniae NOT DETECTED NOT DETECTED   Streptococcus pyogenes NOT DETECTED NOT DETECTED   Acinetobacter baumannii NOT DETECTED NOT DETECTED   Enterobacteriaceae species NOT DETECTED NOT DETECTED   Enterobacter cloacae complex NOT DETECTED NOT DETECTED   Escherichia coli NOT DETECTED NOT DETECTED   Klebsiella oxytoca NOT DETECTED NOT DETECTED   Klebsiella pneumoniae NOT DETECTED NOT DETECTED   Proteus species NOT DETECTED NOT DETECTED   Serratia marcescens NOT DETECTED NOT DETECTED   Haemophilus influenzae NOT DETECTED NOT DETECTED   Neisseria meningitidis NOT DETECTED NOT DETECTED   Pseudomonas aeruginosa NOT DETECTED NOT DETECTED   Candida albicans NOT DETECTED NOT DETECTED   Candida glabrata NOT DETECTED NOT DETECTED   Candida krusei NOT DETECTED NOT DETECTED   Candida parapsilosis NOT DETECTED NOT DETECTED   Candida tropicalis NOT DETECTED NOT DETECTED    Pernell Dupre, PharmD, BCPS Clinical Pharmacist 09/16/2019 12:38 PM

## 2019-09-17 ENCOUNTER — Encounter: Payer: Self-pay | Admitting: Pulmonary Disease

## 2019-09-17 ENCOUNTER — Inpatient Hospital Stay: Payer: Medicare HMO

## 2019-09-17 DIAGNOSIS — R4182 Altered mental status, unspecified: Secondary | ICD-10-CM

## 2019-09-17 LAB — CBC WITH DIFFERENTIAL/PLATELET
Abs Immature Granulocytes: 0.31 10*3/uL — ABNORMAL HIGH (ref 0.00–0.07)
Basophils Absolute: 0.1 10*3/uL (ref 0.0–0.1)
Basophils Relative: 0 %
Eosinophils Absolute: 0 10*3/uL (ref 0.0–0.5)
Eosinophils Relative: 0 %
HCT: 23.5 % — ABNORMAL LOW (ref 36.0–46.0)
Hemoglobin: 8.2 g/dL — ABNORMAL LOW (ref 12.0–15.0)
Immature Granulocytes: 2 %
Lymphocytes Relative: 9 %
Lymphs Abs: 1.6 10*3/uL (ref 0.7–4.0)
MCH: 32.7 pg (ref 26.0–34.0)
MCHC: 34.9 g/dL (ref 30.0–36.0)
MCV: 93.6 fL (ref 80.0–100.0)
Monocytes Absolute: 1.3 10*3/uL — ABNORMAL HIGH (ref 0.1–1.0)
Monocytes Relative: 7 %
Neutro Abs: 14.6 10*3/uL — ABNORMAL HIGH (ref 1.7–7.7)
Neutrophils Relative %: 82 %
Platelets: 369 10*3/uL (ref 150–400)
RBC: 2.51 MIL/uL — ABNORMAL LOW (ref 3.87–5.11)
RDW: 16 % — ABNORMAL HIGH (ref 11.5–15.5)
WBC: 17.9 10*3/uL — ABNORMAL HIGH (ref 4.0–10.5)
nRBC: 0.1 % (ref 0.0–0.2)

## 2019-09-17 LAB — HEPARIN LEVEL (UNFRACTIONATED)
Heparin Unfractionated: 1.19 IU/mL — ABNORMAL HIGH (ref 0.30–0.70)
Heparin Unfractionated: 0.27 IU/mL — ABNORMAL LOW (ref 0.30–0.70)
Heparin Unfractionated: 0.2 IU/mL — ABNORMAL LOW (ref 0.30–0.70)

## 2019-09-17 LAB — RENAL FUNCTION PANEL
Albumin: 2.9 g/dL — ABNORMAL LOW (ref 3.5–5.0)
Anion gap: 11 (ref 5–15)
BUN: 45 mg/dL — ABNORMAL HIGH (ref 8–23)
CO2: 28 mmol/L (ref 22–32)
Calcium: 9 mg/dL (ref 8.9–10.3)
Chloride: 100 mmol/L (ref 98–111)
Creatinine, Ser: 1.3 mg/dL — ABNORMAL HIGH (ref 0.44–1.00)
GFR calc Af Amer: 46 mL/min — ABNORMAL LOW (ref >60)
GFR calc non Af Amer: 40 mL/min — ABNORMAL LOW (ref >60)
Glucose, Bld: 94 mg/dL (ref 70–99)
Phosphorus: 4.4 mg/dL (ref 2.5–4.6)
Potassium: 4.6 mmol/L (ref 3.5–5.1)
Sodium: 139 mmol/L (ref 135–145)

## 2019-09-17 LAB — GLUCOSE, CAPILLARY
Glucose-Capillary: 101 mg/dL — ABNORMAL HIGH (ref 70–99)
Glucose-Capillary: 90 mg/dL (ref 70–99)
Glucose-Capillary: 89 mg/dL (ref 70–99)
Glucose-Capillary: 105 mg/dL — ABNORMAL HIGH (ref 70–99)
Glucose-Capillary: 97 mg/dL (ref 70–99)
Glucose-Capillary: 89 mg/dL (ref 70–99)
Glucose-Capillary: 87 mg/dL (ref 70–99)

## 2019-09-17 LAB — MAGNESIUM: Magnesium: 1.9 mg/dL (ref 1.7–2.4)

## 2019-09-17 LAB — PROCALCITONIN: Procalcitonin: 0.38 ng/mL

## 2019-09-17 LAB — URINE CULTURE: Culture: NO GROWTH

## 2019-09-17 MED ORDER — FUROSEMIDE 10 MG/ML IJ SOLN: 40.0000 mg | Freq: Once | INTRAMUSCULAR | Status: AC

## 2019-09-17 MED ORDER — LORAZEPAM 2 MG/ML IJ SOLN: 0.5000 mg | Freq: Once | INTRAMUSCULAR | Status: AC

## 2019-09-17 MED ORDER — HEPARIN (PORCINE) 25000 UT/250ML-% IV SOLN
1150.0000 [IU]/h | INTRAVENOUS | Status: DC
  Administered 2019-09-19 – 2019-09-21 (×3): 1150 [IU]/h via INTRAVENOUS
  Filled 2019-09-17 (×4): qty 250

## 2019-09-17 MED ORDER — ACETAMINOPHEN 10 MG/ML IV SOLN
1000.0000 mg | Freq: Four times a day (QID) | INTRAVENOUS | Status: AC
  Administered 2019-09-17 – 2019-09-18 (×4): 1000 mg via INTRAVENOUS
  Filled 2019-09-17 (×4): qty 100

## 2019-09-17 MED ORDER — FENTANYL CITRATE (PF) 100 MCG/2ML IJ SOLN: 25.0000 ug | INTRAMUSCULAR | Status: DC | PRN

## 2019-09-17 MED ORDER — IPRATROPIUM-ALBUTEROL 0.5-2.5 (3) MG/3ML IN SOLN
3.0000 mL | RESPIRATORY_TRACT | Status: DC
  Administered 2019-09-17 – 2019-09-24 (×41): 3 mL via RESPIRATORY_TRACT
  Filled 2019-09-17 (×41): qty 3

## 2019-09-17 MED ORDER — FUROSEMIDE 10 MG/ML IJ SOLN
INTRAMUSCULAR | Status: AC
  Administered 2019-09-17: 40 mg via INTRAVENOUS
  Filled 2019-09-17: qty 4

## 2019-09-17 MED ORDER — HYDRALAZINE HCL 20 MG/ML IJ SOLN
10.0000 mg | INTRAMUSCULAR | Status: DC | PRN
  Administered 2019-09-17 – 2019-09-23 (×8): 10 mg via INTRAVENOUS
  Filled 2019-09-17 (×8): qty 1

## 2019-09-17 MED ORDER — DEXMEDETOMIDINE HCL IN NACL 400 MCG/100ML IV SOLN
0.4000 ug/kg/h | INTRAVENOUS | Status: DC
  Administered 2019-09-17 – 2019-09-18 (×4): 0.4 ug/kg/h via INTRAVENOUS
  Filled 2019-09-17 (×3): qty 100

## 2019-09-17 MED ORDER — ACETAMINOPHEN 650 MG RE SUPP
650.0000 mg | RECTAL | Status: DC | PRN
  Administered 2019-09-17: 650 mg via RECTAL
  Filled 2019-09-17: qty 1

## 2019-09-17 MED ORDER — SODIUM CHLORIDE 0.9 % IV SOLN
3.0000 g | Freq: Four times a day (QID) | INTRAVENOUS | Status: DC
  Administered 2019-09-17 – 2019-09-22 (×20): 3 g via INTRAVENOUS
  Filled 2019-09-17 (×3): qty 8
  Filled 2019-09-17 (×6): qty 3
  Filled 2019-09-17 (×2): qty 8
  Filled 2019-09-17: qty 3
  Filled 2019-09-17: qty 8
  Filled 2019-09-17 (×3): qty 3
  Filled 2019-09-17 (×2): qty 8
  Filled 2019-09-17: qty 3
  Filled 2019-09-17 (×2): qty 8
  Filled 2019-09-17 (×2): qty 3
  Filled 2019-09-17: qty 8

## 2019-09-17 MED ORDER — LORAZEPAM 2 MG/ML IJ SOLN
INTRAMUSCULAR | Status: AC
  Filled 2019-09-17: qty 1

## 2019-09-17 NOTE — Consult Note (Signed)
ANTICOAGULATION CONSULT NOTE - Initial Consult  Pharmacy Consult for Heparin infusion  Indication: Subclavian clot  Allergies  Allergen Reactions  . Aspirin Other (See Comments)    GI ulcers  . Morphine And Related Other (See Comments)    Delerium, itching  . Nsaids Other (See Comments)    GI ulcers    Patient Measurements: Height: 5\' 6"  (167.6 cm) Weight: 69.9 kg (154 lb 1.6 oz) IBW/kg (Calculated) : 59.3  Vital Signs: Temp: 99.7 F (37.6 C) (07/04 0400) BP: 136/51 (07/04 0400) Pulse Rate: 62 (07/04 0400)  Labs: Recent Labs    09/15/19 1513 09/15/19 1513 09/15/19 1707 09/15/19 1725 09/15/19 2012 09/15/19 2325 09/16/19 0450 09/16/19 1851 09/16/19 2023  HGB 10.7*  --   --   --   --   --  10.0*  --   --   HCT 31.6*  --   --   --   --   --  28.4*  --   --   PLT 696*  --   --   --   --   --  439*  --   --   APTT  --   --   --   --  31  --   --   --   --   LABPROT  --   --   --   --  13.9  --   --   --   --   INR  --   --   --   --  1.1  --   --   --   --   HEPARINUNFRC  --   --   --   --   --   --   --  1.22* 1.19*  CREATININE 2.64*   < > 2.45*  --   --  2.44* 2.14*  --   --   TROPONINIHS 94*  --   --  118*  --   --   --   --   --    < > = values in this interval not displayed.    Estimated Creatinine Clearance: 21.3 mL/min (A) (by C-G formula based on SCr of 2.14 mg/dL (H)).   Medical History: Past Medical History:  Diagnosis Date  . Anxiety   . Asthma   . COPD (chronic obstructive pulmonary disease) ([place])   . Depression   . GERD (gastroesophageal reflux disease)   . Hypertension    Assessment: Pharmacy consulted for heparin infusion dosing and monitoring for 74 yo female for a subclavian clot. Baseline labs ordered.   Scr: 2.44>> 2.14    CrCl <57ml/min   APTT: 31, INR: 1.1   Hgb: 10   Plts: 439   Goal of Therapy:  Heparin level 0.3-0.7 units/ml Monitor platelets by anticoagulation protocol: Yes   Plan:  07/03 @ 2030 but didn't result til 07/04  at 0400 HL 1.19 supratherapeutic. Per RN heparin was stopped, line was flushed and sample was drawn. As a result will hold drip until 0700 and will restart at 700 units/hr and will recheck HL at 1500 and continue to monitor.  Tobie Lords, PharmD Clinical Pharmacist 09/17/2019 4:58 AM

## 2019-09-17 NOTE — Progress Notes (Signed)
Propofol gtt titrated off. Fentanyl gtt started. Pt extubated. Alert and following commands. Disoriented x4. Pt complains of no pain. 6 L HFNC started with a goal of 88-92%. One time dose of 40 lasix given with significant urine output. Pt developedn significant Rhonchi. RT called neb started and bipap started. Family was at bedside stepped outside to make some phone calls. Will continue to monitor

## 2019-09-17 NOTE — Progress Notes (Addendum)
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  SIGNIFICANT EVENTS  7/2-Presented to ED 7/2-Transient loss of pulse and hypoxia on the vent requiring manual bag ventilation and CPR with ROSC 7/2-Started on Dopamine Gtt 7/3-Remains intubated and sedated 7/3-Seizure like activity noted overnight started on Keppra 7/4-Remain intubated and mechanically ventillated  STUDIES:  7/2- CXR>>interval development of diffuse left lung opacity concerning for atelectasis or pneumonia with some degree of volume loss causing mild right to left mediastinal shift 7/2- CT head>> No acute intracranial abnormality 7/3- US Renal>> Pending 7/3-US Venous LUE>> 7/3- EEG>>Shows no evidence of seizure activity 7/4-Chest xray show no evidence of acute cardiopulmonary disease  CULTURES: SARS-CoV-2 PCR 7/2>> Negative MRSA PCR 7/2>>Negative Blood culture 7/2>BCx 1 of 4 GPC. BCID: staph species.  Urine culture 7/2>No growth Strep Pneumo Urinary antigen 7/2>> Negative Legionella Pneumo Ur Ag 7/2> Pending  ANTIBIOTICS: Unasyn 7/2>>  PAST MEDICAL HISTORY :   has a past medical history of Anxiety, Asthma, COPD (chronic obstructive pulmonary disease) ([place]), Depression, GERD (gastroesophageal reflux disease), and  Hypertension.  has a past surgical history that includes Tubal ligation; Total hip arthroplasty (Left); and Abdominal hysterectomy.   Prior to Admission medications   Medication Sig Start Date End Date Taking? Authorizing Provider  acetaminophen (TYLENOL) 500 MG tablet Take 500-1,000 mg by mouth every 6 (six) hours as needed for moderate pain or fever.     [provider]  albuterol (ACCUNEB) 1.25 MG/3ML nebulizer solution Take 1 ampule by nebulization every 6 (six) hours as needed for wheezing or shortness of breath.    [provider]  albuterol (PROVENTIL HFA;VENTOLIN HFA) 108 (90 Base) MCG/ACT inhaler Inhale 2 puffs into the lungs every 6 (six) hours as needed for wheezing or shortness of breath.    [provider]  amLODipine (NORVASC) 10 MG tablet Take 10 mg by mouth daily.    [provider]  aspirin EC 81 MG EC tablet Take 1 tablet (81 mg total) by mouth daily. 03/27/16   Hillary Bow, MD  carvedilol (COREG) 12.5 MG tablet Take 1 tablet (12.5 mg total) by mouth 2 (two) times daily with a meal. 11/23/17   Mody, Sital, MD  Fluticasone-Salmeterol (ADVAIR) 250-50 MCG/DOSE AEPB Inhale 1 puff into the lungs every 12 (twelve) hours.     [provider]  ipratropium (ATROVENT HFA) 17 MCG/ACT inhaler Inhale 2 puffs into the lungs 3 (three) times daily.     [provider]  lisinopril (PRINIVIL,ZESTRIL) 40 MG tablet Take 40 mg by mouth daily.    [provider]  nicotine (NICODERM CQ - DOSED IN MG/24 HOURS) 21 mg/24hr patch Place 1 patch (21 mg total) onto the skin daily. 11/23/17   Bettey Costa, MD  omeprazole (PRILOSEC) 20 MG capsule Take 20 mg by mouth daily.    [provider]  pravastatin (PRAVACHOL) 20 MG tablet Take 20 mg  by mouth every evening.     [provider]  ranitidine (ZANTAC) 150 MG capsule Take 150 mg by mouth 2 (two) times daily.    [provider]   Scheduled Meds: . aspirin  300 mg Rectal  Daily  . chlorhexidine gluconate (MEDLINE KIT)  15 mL Mouth Rinse BID  . Chlorhexidine Gluconate Cloth  6 each Topical Daily  . docusate  100 mg Oral BID  . insulin aspart  0-9 Units Subcutaneous Q4H  . mouth rinse  15 mL Mouth Rinse 10 times per day  . pantoprazole (PROTONIX) IV  40 mg Intravenous QHS  . patiromer  16.8 g Oral Daily  . polyethylene glycol  17 g Oral Daily   Continuous Infusions: . sodium chloride Stopped (09/16/19 1109)  . ampicillin-sulbactam (UNASYN) IV Stopped (09/17/19 5809)  . dexmedetomidine (PRECEDEX) IV infusion Stopped (09/16/19 2212)  . DOPamine Stopped (09/16/19 1006)  . fentaNYL infusion INTRAVENOUS 25 mcg/hr (09/17/19 0936)  . heparin 700 Units/hr (09/17/19 0758)  . levETIRAcetam 500 mg (09/17/19 0927)  . propofol (DIPRIVAN) infusion 20 mcg/kg/min (09/17/19 0800)   PRN Meds:.sodium chloride, acetaminophen (TYLENOL) oral liquid 160 mg/5 mL, docusate sodium, fentaNYL (SUBLIMAZE) injection, fentaNYL (SUBLIMAZE) injection, midazolam, midazolam, ondansetron (ZOFRAN) IV, polyethylene glycol   Allergies  Allergen Reactions  . Aspirin Other (See Comments)    GI ulcers  . Morphine And Related Other (See Comments)    Delerium, itching  . Nsaids Other (See Comments)    GI ulcers    FAMILY HISTORY:  family history includes Heart disease in her father and sister. SOCIAL HISTORY:  reports that she has been smoking. She has never used smokeless tobacco. She reports that she does not drink alcohol and does not use drugs.  REVIEW OF SYSTEMS:   UNABLE TO ASSESS DUE TO INTUBATION AND SEDATED  VITAL SIGNS: Temp:  [96.8 F (36 C)-101.1 F (38.4 C)] 100.6 F (38.1 C) (07/04 0900) Pulse Rate:  [41-86] 74 (07/04 0900) Resp:  [15-22] 16 (07/04 0900) BP: (107-161)/(42-75) 137/54 (07/04 0900) SpO2:  [91 %-100 %] 93 % (07/04 0900) FiO2 (%):  [40 %-60 %] 50 % (07/04 0730)  PHYSICAL EXAMINATION: GENERAL: 74 year old patient lying in the bed on intubated and  mechanically ventillated EYES:  No scleral icterus. Extraocular muscles intact.  HEENT: Head atraumatic, normocephalic. Oropharynx and nasopharynx clear.  NECK:  Supple, no jugular venous distention. No thyroid enlargement, no tenderness.  LUNGS: Decreased breath sounds bilaterally, no wheezing, rales,rhonchi or crepitation. No use of accessory muscles of respiration.  CARDIOVASCULAR: S1, S2 normal. No murmurs, rubs, or gallops.  ABDOMEN: Soft, nontender, nondistended. Bowel sounds present. No organomegaly or mass.  EXTREMITIES: Generalized dependent edema, cyanosis, or clubbing.  FOCUSED NEUROLOGIC:Patient does respond to verbal stimuli.  Grimaces, noted to be restless.  Does not follow commands.  No verbalizations noted.  Cranial Nerves: II: patient does respond confrontation bilaterally, pupils right 2 mm, left 2 mm,and minimally bilaterally III,IV,VI: doll's response present bilaterally.  V,VII: corneal reflex present bilaterally  VIII: patient does respond to verbal stimuli IX,X: gag reflex reduced, XI: trapezius strength unable to test bilaterally XII: tongue strength unable to test Motor: Spontaneous movement of the right side noted.   Sensory: Respond to noxious stimuli in any extremity. Deep Tendon Reflexes:  Not checked Plantars: Mute Cerebellar: Unable to perform  PSYCHIATRIC: Unable to assess.  SKIN: No obvious rash, lesion, or ulcer.   Recent Labs  Lab 09/15/19 2325 09/16/19 0450 09/17/19 0641  NA 135 135 139  K 5.4* 5.1 4.6  CL 100 99 100  CO2 26 27 28   BUN 58* 57* 45*  CREATININE 2.44* 2.14* 1.30*  GLUCOSE 160* 141* 94   Recent Labs  Lab 09/15/19 1513 09/16/19 0450 09/17/19 0541  HGB 10.7* 10.0* 8.2*  HCT 31.6* 28.4* 23.5*  WBC 15.0* 16.0* 17.9*  PLT 696* 439* 369   EEG  Result Date: 09/16/2019 Philemon Kingdom, MD     09/16/2019  7:56 PM Date of Study: 09/16/2019  Reason for Study: Pt is a 63 YOF with PMH of HTN, HLD, COPD/ASTHMA, MGUS,  ANXIETY/DEPRESSION, BIPOLAR D/O, hx of gross hematuria and left hydronephrosis, s/p left ureteroscopy 5/27/2,  malignant tumor of the left ureter who had acute hypoxic respiratory failure and AMS. Eval for seizures.  Description of recording: This recording was obtained using a Digital EEG system with 18 channel capacity . Standard bipolar and referential EEG montages were used following the International  10 - 20  System .  This is a digitally recorded EEG with duration of approximately ~21:17 minutes. The background consists of 7-8 hz alpha rhythm which attenuates with eye opening and closure.  There were no epileptiform discharges noted.  There were no recorded electrographic seizures. Hyperventilation was not performed. Photic stimulation was not performed. Sleep did not occur in this recording.   IMPRESSION: This is a normal EEG for age. There were no clinical seizures or epileptiform discharges noted during study. CLINICAL CORRELATION: This EEG finding did not support the diagnosis of seizure.  Clinical correlation is advised. A normal EEG does not rule out epilepsy, which is a clinical diagnosis.  I have personally reviewed this study.  DG Chest 1 View  Result Date: 09/15/2019 CLINICAL DATA:  Deoxygenation. EXAM: CHEST  1 VIEW COMPARISON:  September 15, 2019 FINDINGS: There is stable endotracheal tube and nasogastric tube positioning. Mild left basilar atelectasis and/or infiltrate is seen. This is decreased in severity when compared to the prior study. A very small left pleural effusion is also seen. This is also decreased in size. There is mild elevation of the left hemidiaphragm. No pneumothorax is identified. The heart size and mediastinal contours are within normal limits. There is marked severity calcification of the aortic arch. The visualized skeletal structures are unremarkable. The proximal end of a left-sided endo ureteral stent is seen within the visualized portion of the abdomen. IMPRESSION:  Mild left basilar atelectasis and/or infiltrate, decreased in size when compared to the prior study. Electronically Signed   By: Virgina Norfolk M.D.   On: 09/15/2019 17:58   DG Chest 1 View  Result Date: 09/15/2019 CLINICAL DATA:  Altered mental status. EXAM: CHEST  1 VIEW COMPARISON:  November 21, 2017. FINDINGS: There is interval development of diffuse left lung opacity concerning for atelectasis or pneumonia with some degree of volume loss causing mild right to left mediastinal shift. No pneumothorax is noted. Possible small left pleural effusion is noted. The right lung is hyperexpanded but otherwise normal. Bony thorax is unremarkable. IMPRESSION: Interval development of diffuse left lung opacity concerning for atelectasis or pneumonia with some degree of volume loss causing mild right to left mediastinal shift. Possible small left pleural effusion. Aortic Atherosclerosis (ICD10-I70.0). Electronically Signed   By: Marijo Conception M.D.   On: 09/15/2019 15:01   CT HEAD WO CONTRAST  Result Date: 09/16/2019 CLINICAL DATA:  Altered mental status, unclear cause EXAM: CT HEAD WITHOUT CONTRAST TECHNIQUE: Contiguous axial images were obtained from the base of the skull through  the vertex without intravenous contrast. COMPARISON:  None. FINDINGS: Brain: Hypoattenuating focus in the left external capsule likely reflects a remote lacunar infarct. No evidence of acute infarction, hemorrhage, hydrocephalus, extra-axial collection or mass lesion/mass effect. Symmetric prominence of the ventricles, cisterns and sulci compatible with parenchymal volume loss. Patchy areas of white matter hypoattenuation are most compatible with chronic microvascular angiopathy. Senescent mineralization seen in the basal ganglia. Vascular: Atherosclerotic calcification of the carotid siphons. No hyperdense vessel. Skull: No calvarial fracture or suspicious osseous lesion. No scalp swelling or hematoma. Sinuses/Orbits: Minimal thickening  in the ethmoids. Remaining paranasal sinuses and mastoid air cells are predominantly clear without pneumatized secretions or air-fluid levels. Hypo pneumatization of the frontal sinuses. Included orbital structures are unremarkable. Other: None IMPRESSION: 1. 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 2. No definite acute intracranial findings. 3. Chronic microvascular angiopathy and parenchymal volume loss. Electronically Signed   By: Lovena Le M.D.   On: 09/16/2019 00:11   US RENAL  Result Date: 09/16/2019 CLINICAL DATA:  Acute kidney failure. EXAM: RENAL / URINARY TRACT ULTRASOUND COMPLETE COMPARISON:  None. FINDINGS: Right Kidney: Renal measurements: 9.7 x 3.7 x 4.3 cm = volume: 81 mL. Mild increased cortical echogenicity. No hydronephrosis or focal mass. Small focus of right perinephric fluid. Left Kidney: Renal measurements: 10.0 x 4.8 x 4.9 cm = volume: 123 mL. Echogenicity within normal limits. No mass or hydronephrosis visualized. 1.8 cm lower pole cyst. Bladder: Decompressed with presence of Foley catheter. Other: None. IMPRESSION: 1. Normal size kidneys without hydronephrosis. Mild increased right sided cortical echogenicity which could be seen with medical renal disease. Small amount of adjacent right perinephric fluid. 2.  1.8 cm left renal cyst. Electronically Signed   By: Marin Olp M.D.   On: 09/16/2019 11:53   US Venous Img Upper Uni Left (DVT)  Result Date: 09/16/2019 CLINICAL DATA:  74 year old female with a history of mottled skin EXAM: LEFT UPPER EXTREMITY VENOUS DOPPLER ULTRASOUND TECHNIQUE: Gray-scale sonography with graded compression, as well as color Doppler and duplex ultrasound were performed to evaluate the upper extremity deep venous system from the level of the subclavian vein and including the jugular, axillary, basilic, radial, ulnar and upper cephalic vein. Spectral Doppler was utilized to evaluate flow at rest and with distal augmentation maneuvers.  COMPARISON:  None. FINDINGS: Contralateral Subclavian Vein: Respiratory phasicity is normal and symmetric with the symptomatic side. No evidence of thrombus. Normal compressibility. Internal Jugular Vein: No evidence of thrombus. Normal compressibility, respiratory phasicity and response to augmentation. Subclavian Vein: Partially occlusive thrombus of the left subclavian vein. Axillary Vein: Occlusive thrombus of the axillary vein, noncompressible. Cephalic Vein: Occlusive thrombus of the cephalic vein, noncompressible. Basilic Vein: Occlusive thrombus of the basilic vein, noncompressive. Brachial Veins: Paired Brachial veins, both of which demonstrate occlusive thrombus. Radial Veins: Occlusive thrombus Ulnar Veins: Occlusive thrombus Other Findings:  None visualized. IMPRESSION: Sonographic survey of the left upper extremity positive for DVT of the subclavian vein, axillary vein, and paired brachial veins. Occlusive of the axillary and paired brachial veins, and nonocclusive at the subclavian vein. Superficial occlusive thrombophlebitis of the cephalic vein, basilic vein, radial vein, and ulnar vein. Electronically Signed   By: Corrie Mckusick D.O.   On: 09/16/2019 12:59   DG Chest Port 1 View  Result Date: 09/17/2019 CLINICAL DATA:  74 year old female with history of respiratory failure. EXAM: PORTABLE CHEST 1 VIEW COMPARISON:  Chest x-ray 09/15/2019. FINDINGS: An endotracheal tube is in place with tip 4.7 cm  above the carina. Left IJ Vas-Cath with tip terminating in the distal superior vena cava. Nasogastric tube with tip terminating in the proximal stomach. Incompletely imaged catheter in the left upper quadrant, likely to represent a left-sided ureteral stent. Lung volumes are normal. No consolidative airspace disease. No pleural effusions. No pneumothorax. No pulmonary nodule or mass noted. No evidence of pulmonary edema. Heart size is borderline enlarged. Upper mediastinal contours are unremarkable.  Atherosclerosis in the thoracic aorta. IMPRESSION: 1. Support apparatus, as above. 2. No radiographic evidence of acute cardiopulmonary disease. 3. Aortic atherosclerosis. Electronically Signed   By: Vinnie Langton M.D.   On: 09/17/2019 04:57   DG Chest Port 1 View  Result Date: 09/15/2019 CLINICAL DATA:  Central line placement. EXAM: PORTABLE CHEST 1 VIEW COMPARISON:  September 15, 2019 (7:18 p.m.) FINDINGS: A left internal jugular venous catheter is seen. Its distal tip is noted within the distal aspect of the superior vena cava. This represents a new finding when compared to the prior study. There is stable endotracheal tube and nasogastric tube positioning. Mild, stable left lower lobe atelectasis and/or infiltrate is seen. No pleural effusion or pneumothorax is identified. The heart size and mediastinal contours are within normal limits. The visualized skeletal structures are unremarkable. IMPRESSION: 1. Interval left internal jugular venous catheter placement with its distal tip in the superior vena cava. 2. Stable left lower lobe atelectasis and/or infiltrate. Electronically Signed   By: Virgina Norfolk M.D.   On: 09/15/2019 23:10   DG Chest Port 1 View  Result Date: 09/15/2019 CLINICAL DATA:  Hypoxia EXAM: PORTABLE CHEST 1 VIEW COMPARISON:  09/15/2019 FINDINGS: Support devices are stable. Left lower lobe atelectasis or infiltrate with probable layering left effusion. Right lung clear. Mild hyperinflation. Heart is normal size. No acute bony abnormality. IMPRESSION: Hyperinflation. Continued left lower lobe atelectasis or infiltrate with suspected layering left effusion. Support devices stable. Electronically Signed   By: Rolm Baptise M.D.   On: 09/15/2019 19:33   DG Chest Portable 1 View  Result Date: 09/15/2019 CLINICAL DATA:  Intubated EXAM: PORTABLE CHEST 1 VIEW COMPARISON:  09/15/2019, 11/21/2017 FINDINGS: Interval intubation, tip of the endotracheal tube is about 2 cm superior to carina.  Esophageal tube tip and side port overlie the mid gastric region. Right lung is clear. Mediastinal shift to the left consistent with volume loss. Diffuse opacity in the left thorax likely due to combination of pleural effusion and airspace disease. IMPRESSION: 1. Endotracheal tube tip about 2 cm superior to carina. Esophageal tube tip and side port overlie the mid stomach. 2. Volume loss in the left thorax with diffuse opacity in the left thorax, likely due to combination of pleural effusion and airspace disease. Central obstructing lesion is a concern, consider correlation with contrast-enhanced chest CT. Electronically Signed   By: Donavan Foil M.D.   On: 09/15/2019 15:40   ECHOCARDIOGRAM COMPLETE  Result Date: 09/16/2019    ECHOCARDIOGRAM REPORT   Patient Name:   Pamela James Date of Exam: 09/16/2019 Medical Rec #:  [id number]  Height:       66.0 in Accession #:    [id number] Weight:       154.1 lb Date of Birth:  Jan 06, 1946   BSA:          1.790 m Patient Age:    18 years   BP:           122/52 mmHg Patient Gender: F          HR:  74 bpm. 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  Sonographer:     Arville Go RDCS Referring Phys:  [id number] Bradly Bienenstock Diagnosing Phys: Yolonda Kida MD  Sonographer Comments: Echo performed with patient supine and on artificial respirator. IMPRESSIONS  1. Left ventricular ejection fraction, by estimation, is 60 to 65%. The left ventricle has normal function. The left ventricle has no regional wall motion abnormalities. Left ventricular diastolic parameters were normal.  2. Right ventricular systolic function is normal. The right ventricular size is normal.  3. The mitral valve is grossly normal. No evidence of mitral valve regurgitation.  4. The aortic valve is grossly normal. Aortic valve regurgitation is not  visualized. FINDINGS  Left Ventricle: Left ventricular ejection fraction, by estimation, is 60 to 65%. The left ventricle has normal function. The left ventricle has no regional wall motion abnormalities. The left ventricular internal cavity size was normal in size. There is  borderline concentric left ventricular hypertrophy. Left ventricular diastolic parameters were normal. Right Ventricle: The right ventricular size is normal. No increase in right ventricular wall thickness. Right ventricular systolic function is normal. Left Atrium: Left atrial size was normal in size. Right Atrium: Right atrial size was normal in size. Pericardium: There is no evidence of pericardial effusion. Mitral Valve: The mitral valve is grossly normal. There is mild prolapse of of the mitral valve. There is mild thickening of the mitral valve leaflet(s). Normal mobility of the mitral valve leaflets. No evidence of mitral valve regurgitation. Tricuspid Valve: The tricuspid valve is normal in structure. Tricuspid valve regurgitation is trivial. Aortic Valve: The aortic valve is grossly normal. Aortic valve regurgitation is not visualized. Aortic valve peak gradient measures 4.4 mmHg. Pulmonic Valve: The pulmonic valve was grossly normal. Pulmonic valve regurgitation is not visualized. Aorta: The aortic root is normal in size and structure. IAS/Shunts: No atrial level shunt detected by color flow Doppler.  LEFT VENTRICLE PLAX 2D LVIDd:         3.91 cm  Diastology LVIDs:         2.69 cm  LV e' lateral:   7.29 cm/s LV PW:         1.09 cm  LV E/e' lateral: 7.2 LV IVS:        1.11 cm  LV e' medial:    5.33 cm/s LVOT diam:     2.00 cm  LV E/e' medial:  9.8 LV SV:         46 LV SV Index:   26 LVOT Area:     3.14 cm  RIGHT VENTRICLE RV Basal diam:  2.97 cm TAPSE (M-mode): 1.1 cm LEFT ATRIUM             Index       RIGHT ATRIUM          Index LA diam:        3.40 cm 1.90 cm/m  RA Area:     8.09 cm LA Vol (A2C):   16.3 ml 9.11 ml/m  RA Volume:    16.80 ml 9.39 ml/m LA Vol (A4C):   20.1 ml 11.23 ml/m LA Biplane Vol: 18.9 ml 10.56 ml/m  AORTIC VALVE  PULMONIC VALVE AV Area (Vmax): 2.05 cm    PV Vmax:       0.92 m/s AV Vmax:        105.00 cm/s PV Peak grad:  3.4 mmHg AV Peak Grad:   4.4 mmHg LVOT Vmax:      68.50 cm/s LVOT Vmean:     51.900 cm/s LVOT VTI:       0.146 m  AORTA Ao Root diam: 2.70 cm MITRAL VALVE               TRICUSPID VALVE MV Area (PHT): 2.73 cm    TV Peak grad:   29.8 mmHg MV Decel Time: 278 msec    TV Vmax:        2.73 m/s MV E velocity: 52.30 cm/s MV A velocity: 47.10 cm/s  SHUNTS MV E/A ratio:  1.11        Systemic VTI:  0.15 m                            Systemic Diam: 2.00 cm Yolonda Kida MD Electronically signed by Yolonda Kida MD Signature Date/Time: 09/16/2019/11:11:40 AM    Final     Date: 09/15/2019 EKG Time: 14:36 Rate: 74 Rhythm: 2nd degree heart block vs 1st degree block with PAC's Axis: Normal Intervals:none ST&T Change: peaked T waves   ASSESSMENT / PLAN:  Acute Hypoxic  Respiratory Failure secondary to Aspiration Pneumonia? COPD without evidence of acute exacerbation -Full vent support for now-vent settings reviewed and established  -SBT once all parameters met  -VAP bundle implemented -Wean off sedation as tolerated -Trend WBC and monitor fever curve  -Follow intermittent ABG and chest x-ray as needed -Continue Unasyn -As needed bronchodilators -Encourage smoking cessation  Significant Bradycardia - Secondary to Hyperkalemia?  S/p transient loss of pulse with ROSC -EKG shows questionable Junctional rhythm/slow afib? -Now ff Dopamine gtt -Aggressive electrolyte replacement -Cardiology input appreciated  Acute Metabolic Encephalopathy - secondary to severe metabolic derangement - Exam improving -Continue to treat underlying medical issues -CT head 7/3  negative for acute intracranial abnormality  Myoclonic Jerking - concerns of seizure activity? 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   Rufina Falco. DNP, CCRN, FNP-BC [facility]    09/17/2019, 9:56 AM

## 2019-09-17 NOTE — Consult Note (Signed)
ANTICOAGULATION CONSULT NOTE - Initial Consult  Pharmacy Consult for Heparin infusion  Indication: Subclavian clot  Allergies  Allergen Reactions  . Aspirin Other (See Comments)    GI ulcers  . Morphine And Related Other (See Comments)    Delerium, itching  . Nsaids Other (See Comments)    GI ulcers    Patient Measurements: Height: 5\' 6"  (167.6 cm) Weight: 69.9 kg (154 lb 1.6 oz) IBW/kg (Calculated) : 59.3  Vital Signs: Temp: 100.9 F (38.3 C) (07/04 1500) Temp Source: Bladder (07/04 1200) BP: 172/62 (07/04 1500) Pulse Rate: 88 (07/04 1500)  Labs: Recent Labs    09/15/19 1513 09/15/19 1513 09/15/19 1707 09/15/19 1725 09/15/19 2012 09/15/19 2325 09/16/19 0450 09/16/19 1851 09/16/19 2023 09/17/19 0541 09/17/19 0641 09/17/19 1456  HGB 10.7*   < >  --   --   --   --  10.0*  --   --  8.2*  --   --   HCT 31.6*  --   --   --   --   --  28.4*  --   --  23.5*  --   --   PLT 696*  --   --   --   --   --  439*  --   --  369  --   --   APTT  --   --   --   --  31  --   --   --   --   --   --   --   LABPROT  --   --   --   --  13.9  --   --   --   --   --   --   --   INR  --   --   --   --  1.1  --   --   --   --   --   --   --   HEPARINUNFRC  --   --   --   --   --   --   --  1.22* 1.19*  --   --  0.27*  CREATININE 2.64*  --    < >  --   --  2.44* 2.14*  --   --   --  1.30*  --   TROPONINIHS 94*  --   --  118*  --   --   --   --   --   --   --   --    < > = values in this interval not displayed.    Estimated Creatinine Clearance: 35 mL/min (A) (by C-G formula based on SCr of 1.3 mg/dL (H)).   Medical History: Past Medical History:  Diagnosis Date  . Anxiety   . Asthma   . COPD (chronic obstructive pulmonary disease) ([place])   . Depression   . GERD (gastroesophageal reflux disease)   . Hypertension    Assessment: Pharmacy consulted for heparin infusion dosing and monitoring for 74 yo female for a subclavian clot. Baseline labs ordered.   Scr: 2.44>> 2.14    CrCl  <64ml/min   APTT: 31, INR: 1.1   Hgb: 10   Plts: 439   07/4 HL supratherapeutic @ 1.19. Heparin rate reduced to 700 units/hr.    Goal of Therapy:  Heparin level 0.3-0.7 units/ml Monitor platelets by anticoagulation protocol: Yes   Plan:  07/04 @ 1456 HL: 0.27. Level slightly subtherapeutic Will increase infusion rate to  800 units/hr.  Will recheck heparin level in 6 hours.  CBC in AM per protocol  Pernell Dupre, PharmD Clinical Pharmacist 09/17/2019 3:25 PM

## 2019-09-17 NOTE — Progress Notes (Signed)
[facility] Cardiology    SUBJECTIVE: Intubated sedated   Vitals:   09/17/19 0800 09/17/19 0900 09/17/19 1000 09/17/19 1100  BP: (!) 153/54 (!) 137/54 (!) 152/61 (!) 150/56  Pulse: 85 74 83 78  Resp: 20 16 (!) 25 19  Temp: (!) 101.1 F (38.4 C) (!) 100.6 F (38.1 C) 100.2 F (37.9 C) 100.2 F (37.9 C)  TempSrc: Bladder  Bladder Bladder  SpO2: 92% 93% 92% 93%  Weight:      Height:         Intake/Output Summary (Last 24 hours) at 09/17/2019 1436 Last data filed at 09/17/2019 1100 Gross per 24 hour  Intake 1835.3 ml  Output 1350 ml  Net 485.3 ml      PHYSICAL EXAM  General: Well developed, well nourished, in no acute distress HEENT:  Normocephalic and atramatic Neck:  No JVD.  Lungs: Clear bilaterally to auscultation and percussion. Heart: HRRR . Normal S1 and S2 without gallops or murmurs.  Abdomen: Bowel sounds are positive, abdomen soft and non-tender  Msk:  Back normal, normal gait. Normal strength and tone for age. Extremities: No clubbing, cyanosis or edema.   Neuro: Alert and oriented X 3. Psych:  Good affect, responds appropriately   LABS: Basic Metabolic Panel: Recent Labs    09/16/19 0450 09/17/19 0541 09/17/19 0641  NA 135  --  139  K 5.1  --  4.6  CL 99  --  100  CO2 27  --  28  GLUCOSE 141*  --  94  BUN 57*  --  45*  CREATININE 2.14*  --  1.30*  CALCIUM 9.4  --  9.0  MG 2.0 1.9  --   PHOS 5.0*  --  4.4   Liver Function Tests: Recent Labs    09/15/19 1513 09/17/19 0641  AST 40  --   ALT 40  --   ALKPHOS 61  --   BILITOT 0.7  --   PROT 6.8  --   ALBUMIN 3.7 2.9*   Recent Labs    09/15/19 1725  LIPASE 29  AMYLASE 81   CBC: Recent Labs    09/15/19 1513 09/15/19 1513 09/16/19 0450 09/17/19 0541  WBC 15.0*   < > 16.0* 17.9*  NEUTROABS 10.5*  --   --  14.6*  HGB 10.7*   < > 10.0* 8.2*  HCT 31.6*   < > 28.4* 23.5*  MCV 96.9   < > 92.8 93.6  PLT 696*   < > 439* 369   < > = values in this interval not displayed.   Cardiac Enzymes:  No results for input(s): CKTOTAL, CKMB, CKMBINDEX, TROPONINI in the last 72 hours. BNP: Invalid input(s): POCBNP D-Dimer: No results for input(s): DDIMER in the last 72 hours. Hemoglobin A1C: Recent Labs    09/16/19 0450  HGBA1C 5.4   Fasting Lipid Panel: Recent Labs    09/15/19 2325  TRIG 117   Thyroid Function Tests: No results for input(s): TSH, T4TOTAL, T3FREE, THYROIDAB in the last 72 hours.  Invalid input(s): FREET3 Anemia Panel: No results for input(s): VITAMINB12, FOLATE, FERRITIN, TIBC, IRON, RETICCTPCT in the last 72 hours.  EEG  Result Date: 09/16/2019 Philemon Kingdom, MD     09/16/2019  7:56 PM Date of Study: 09/16/2019  Reason for Study: Pt is a 54 YOF with PMH of HTN, HLD, COPD/ASTHMA, MGUS, ANXIETY/DEPRESSION, BIPOLAR D/O, hx of gross hematuria and left hydronephrosis, s/p left ureteroscopy 5/27/2,  malignant tumor of the left ureter  who had acute hypoxic respiratory failure and AMS. Eval for seizures.  Description of recording: This recording was obtained using a Digital EEG system with 18 channel capacity . Standard bipolar and referential EEG montages were used following the International  10 - 20  System .  This is a digitally recorded EEG with duration of approximately ~21:17 minutes. The background consists of 7-8 hz alpha rhythm which attenuates with eye opening and closure.  There were no epileptiform discharges noted.  There were no recorded electrographic seizures. Hyperventilation was not performed. Photic stimulation was not performed. Sleep did not occur in this recording.   IMPRESSION: This is a normal EEG for age. There were no clinical seizures or epileptiform discharges noted during study. CLINICAL CORRELATION: This EEG finding did not support the diagnosis of seizure.  Clinical correlation is advised. A normal EEG does not rule out epilepsy, which is a clinical diagnosis.  I have personally reviewed this study.  DG Chest 1 View  Result Date:  09/15/2019 CLINICAL DATA:  Deoxygenation. EXAM: CHEST  1 VIEW COMPARISON:  September 15, 2019 FINDINGS: There is stable endotracheal tube and nasogastric tube positioning. Mild left basilar atelectasis and/or infiltrate is seen. This is decreased in severity when compared to the prior study. A very small left pleural effusion is also seen. This is also decreased in size. There is mild elevation of the left hemidiaphragm. No pneumothorax is identified. The heart size and mediastinal contours are within normal limits. There is marked severity calcification of the aortic arch. The visualized skeletal structures are unremarkable. The proximal end of a left-sided endo ureteral stent is seen within the visualized portion of the abdomen. IMPRESSION: Mild left basilar atelectasis and/or infiltrate, decreased in size when compared to the prior study. Electronically Signed   By: Virgina Norfolk M.D.   On: 09/15/2019 17:58   DG Chest 1 View  Result Date: 09/15/2019 CLINICAL DATA:  Altered mental status. EXAM: CHEST  1 VIEW COMPARISON:  November 21, 2017. FINDINGS: There is interval development of diffuse left lung opacity concerning for atelectasis or pneumonia with some degree of volume loss causing mild right to left mediastinal shift. No pneumothorax is noted. Possible small left pleural effusion is noted. The right lung is hyperexpanded but otherwise normal. Bony thorax is unremarkable. IMPRESSION: Interval development of diffuse left lung opacity concerning for atelectasis or pneumonia with some degree of volume loss causing mild right to left mediastinal shift. Possible small left pleural effusion. Aortic Atherosclerosis (ICD10-I70.0). Electronically Signed   By: Marijo Conception M.D.   On: 09/15/2019 15:01   CT HEAD WO CONTRAST  Result Date: 09/16/2019 CLINICAL DATA:  Altered mental status, unclear cause EXAM: CT HEAD WITHOUT CONTRAST TECHNIQUE: Contiguous axial images were obtained from the base of the skull through  the vertex without intravenous contrast. COMPARISON:  None. FINDINGS: Brain: Hypoattenuating focus in the left external capsule likely reflects a remote lacunar infarct. No evidence of acute infarction, hemorrhage, hydrocephalus, extra-axial collection or mass lesion/mass effect. Symmetric prominence of the ventricles, cisterns and sulci compatible with parenchymal volume loss. Patchy areas of white matter hypoattenuation are most compatible with chronic microvascular angiopathy. Senescent mineralization seen in the basal ganglia. Vascular: Atherosclerotic calcification of the carotid siphons. No hyperdense vessel. Skull: No calvarial fracture or suspicious osseous lesion. No scalp swelling or hematoma. Sinuses/Orbits: Minimal thickening in the ethmoids. Remaining paranasal sinuses and mastoid air cells are predominantly clear without pneumatized secretions or air-fluid levels. Hypo pneumatization of the frontal sinuses. Included  orbital structures are unremarkable. Other: None IMPRESSION: 1. 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 2. No definite acute intracranial findings. 3. Chronic microvascular angiopathy and parenchymal volume loss. Electronically Signed   By: Lovena Le M.D.   On: 09/16/2019 00:11   US RENAL  Result Date: 09/16/2019 CLINICAL DATA:  Acute kidney failure. EXAM: RENAL / URINARY TRACT ULTRASOUND COMPLETE COMPARISON:  None. FINDINGS: Right Kidney: Renal measurements: 9.7 x 3.7 x 4.3 cm = volume: 81 mL. Mild increased cortical echogenicity. No hydronephrosis or focal mass. Small focus of right perinephric fluid. Left Kidney: Renal measurements: 10.0 x 4.8 x 4.9 cm = volume: 123 mL. Echogenicity within normal limits. No mass or hydronephrosis visualized. 1.8 cm lower pole cyst. Bladder: Decompressed with presence of Foley catheter. Other: None. IMPRESSION: 1. Normal size kidneys without hydronephrosis. Mild increased right sided cortical echogenicity which could be seen with  medical renal disease. Small amount of adjacent right perinephric fluid. 2.  1.8 cm left renal cyst. Electronically Signed   By: Marin Olp M.D.   On: 09/16/2019 11:53   US Venous Img Upper Uni Left (DVT)  Result Date: 09/16/2019 CLINICAL DATA:  74 year old female with a history of mottled skin EXAM: LEFT UPPER EXTREMITY VENOUS DOPPLER ULTRASOUND TECHNIQUE: Gray-scale sonography with graded compression, as well as color Doppler and duplex ultrasound were performed to evaluate the upper extremity deep venous system from the level of the subclavian vein and including the jugular, axillary, basilic, radial, ulnar and upper cephalic vein. Spectral Doppler was utilized to evaluate flow at rest and with distal augmentation maneuvers. COMPARISON:  None. FINDINGS: Contralateral Subclavian Vein: Respiratory phasicity is normal and symmetric with the symptomatic side. No evidence of thrombus. Normal compressibility. Internal Jugular Vein: No evidence of thrombus. Normal compressibility, respiratory phasicity and response to augmentation. Subclavian Vein: Partially occlusive thrombus of the left subclavian vein. Axillary Vein: Occlusive thrombus of the axillary vein, noncompressible. Cephalic Vein: Occlusive thrombus of the cephalic vein, noncompressible. Basilic Vein: Occlusive thrombus of the basilic vein, noncompressive. Brachial Veins: Paired Brachial veins, both of which demonstrate occlusive thrombus. Radial Veins: Occlusive thrombus Ulnar Veins: Occlusive thrombus Other Findings:  None visualized. IMPRESSION: Sonographic survey of the left upper extremity positive for DVT of the subclavian vein, axillary vein, and paired brachial veins. Occlusive of the axillary and paired brachial veins, and nonocclusive at the subclavian vein. Superficial occlusive thrombophlebitis of the cephalic vein, basilic vein, radial vein, and ulnar vein. Electronically Signed   By: Corrie Mckusick D.O.   On: 09/16/2019 12:59   DG Chest  Port 1 View  Result Date: 09/17/2019 CLINICAL DATA:  74 year old female with history of respiratory failure. EXAM: PORTABLE CHEST 1 VIEW COMPARISON:  Chest x-ray 09/15/2019. FINDINGS: An endotracheal tube is in place with tip 4.7 cm above the carina. Left IJ Vas-Cath with tip terminating in the distal superior vena cava. Nasogastric tube with tip terminating in the proximal stomach. Incompletely imaged catheter in the left upper quadrant, likely to represent a left-sided ureteral stent. Lung volumes are normal. No consolidative airspace disease. No pleural effusions. No pneumothorax. No pulmonary nodule or mass noted. No evidence of pulmonary edema. Heart size is borderline enlarged. Upper mediastinal contours are unremarkable. Atherosclerosis in the thoracic aorta. IMPRESSION: 1. Support apparatus, as above. 2. No radiographic evidence of acute cardiopulmonary disease. 3. Aortic atherosclerosis. Electronically Signed   By: Vinnie Langton M.D.   On: 09/17/2019 04:57   DG Chest Port 1 View  Result Date: 09/15/2019 CLINICAL  DATA:  Central line placement. EXAM: PORTABLE CHEST 1 VIEW COMPARISON:  September 15, 2019 (7:18 p.m.) FINDINGS: A left internal jugular venous catheter is seen. Its distal tip is noted within the distal aspect of the superior vena cava. This represents a new finding when compared to the prior study. There is stable endotracheal tube and nasogastric tube positioning. Mild, stable left lower lobe atelectasis and/or infiltrate is seen. No pleural effusion or pneumothorax is identified. The heart size and mediastinal contours are within normal limits. The visualized skeletal structures are unremarkable. IMPRESSION: 1. Interval left internal jugular venous catheter placement with its distal tip in the superior vena cava. 2. Stable left lower lobe atelectasis and/or infiltrate. Electronically Signed   By: Virgina Norfolk M.D.   On: 09/15/2019 23:10   DG Chest Port 1 View  Result Date: 09/15/2019  CLINICAL DATA:  Hypoxia EXAM: PORTABLE CHEST 1 VIEW COMPARISON:  09/15/2019 FINDINGS: Support devices are stable. Left lower lobe atelectasis or infiltrate with probable layering left effusion. Right lung clear. Mild hyperinflation. Heart is normal size. No acute bony abnormality. IMPRESSION: Hyperinflation. Continued left lower lobe atelectasis or infiltrate with suspected layering left effusion. Support devices stable. Electronically Signed   By: Rolm Baptise M.D.   On: 09/15/2019 19:33   DG Chest Portable 1 View  Result Date: 09/15/2019 CLINICAL DATA:  Intubated EXAM: PORTABLE CHEST 1 VIEW COMPARISON:  09/15/2019, 11/21/2017 FINDINGS: Interval intubation, tip of the endotracheal tube is about 2 cm superior to carina. Esophageal tube tip and side port overlie the mid gastric region. Right lung is clear. Mediastinal shift to the left consistent with volume loss. Diffuse opacity in the left thorax likely due to combination of pleural effusion and airspace disease. IMPRESSION: 1. Endotracheal tube tip about 2 cm superior to carina. Esophageal tube tip and side port overlie the mid stomach. 2. Volume loss in the left thorax with diffuse opacity in the left thorax, likely due to combination of pleural effusion and airspace disease. Central obstructing lesion is a concern, consider correlation with contrast-enhanced chest CT. Electronically Signed   By: Donavan Foil M.D.   On: 09/15/2019 15:40   ECHOCARDIOGRAM COMPLETE  Result Date: 09/16/2019    ECHOCARDIOGRAM REPORT   Patient Name:   Pamela James Date of Exam: 09/16/2019 Medical Rec #:  [id number]  Height:       66.0 in Accession #:    [id number] Weight:       154.1 lb Date of Birth:  Feb 11, 1946   BSA:          1.790 m Patient Age:    36 years   BP:           122/52 mmHg Patient Gender: F          HR:           74 bpm. Exam Location:  [facility] Procedure: 2D Echo Indications:     Cardiac Arrest I46.9  History:         Patient has prior history of Echocardiogram  examinations, most                  recent 03/25/2016.  Sonographer:     Arville Go RDCS Referring Phys:  [id number] Bradly Bienenstock Diagnosing Phys: Yolonda Kida MD  Sonographer Comments: Echo performed with patient supine and on artificial respirator. IMPRESSIONS  1. Left ventricular ejection fraction, by estimation, is 60 to 65%. The left ventricle has normal function. The left ventricle has no regional  wall motion abnormalities. Left ventricular diastolic parameters were normal.  2. Right ventricular systolic function is normal. The right ventricular size is normal.  3. The mitral valve is grossly normal. No evidence of mitral valve regurgitation.  4. The aortic valve is grossly normal. Aortic valve regurgitation is not visualized. FINDINGS  Left Ventricle: Left ventricular ejection fraction, by estimation, is 60 to 65%. The left ventricle has normal function. The left ventricle has no regional wall motion abnormalities. The left ventricular internal cavity size was normal in size. There is  borderline concentric left ventricular hypertrophy. Left ventricular diastolic parameters were normal. Right Ventricle: The right ventricular size is normal. No increase in right ventricular wall thickness. Right ventricular systolic function is normal. Left Atrium: Left atrial size was normal in size. Right Atrium: Right atrial size was normal in size. Pericardium: There is no evidence of pericardial effusion. Mitral Valve: The mitral valve is grossly normal. There is mild prolapse of of the mitral valve. There is mild thickening of the mitral valve leaflet(s). Normal mobility of the mitral valve leaflets. No evidence of mitral valve regurgitation. Tricuspid Valve: The tricuspid valve is normal in structure. Tricuspid valve regurgitation is trivial. Aortic Valve: The aortic valve is grossly normal. Aortic valve regurgitation is not visualized. Aortic valve peak gradient measures 4.4 mmHg. Pulmonic Valve: The pulmonic  valve was grossly normal. Pulmonic valve regurgitation is not visualized. Aorta: The aortic root is normal in size and structure. IAS/Shunts: No atrial level shunt detected by color flow Doppler.  LEFT VENTRICLE PLAX 2D LVIDd:         3.91 cm  Diastology LVIDs:         2.69 cm  LV e' lateral:   7.29 cm/s LV PW:         1.09 cm  LV E/e' lateral: 7.2 LV IVS:        1.11 cm  LV e' medial:    5.33 cm/s LVOT diam:     2.00 cm  LV E/e' medial:  9.8 LV SV:         46 LV SV Index:   26 LVOT Area:     3.14 cm  RIGHT VENTRICLE RV Basal diam:  2.97 cm TAPSE (M-mode): 1.1 cm LEFT ATRIUM             Index       RIGHT ATRIUM          Index LA diam:        3.40 cm 1.90 cm/m  RA Area:     8.09 cm LA Vol (A2C):   16.3 ml 9.11 ml/m  RA Volume:   16.80 ml 9.39 ml/m LA Vol (A4C):   20.1 ml 11.23 ml/m LA Biplane Vol: 18.9 ml 10.56 ml/m  AORTIC VALVE                PULMONIC VALVE AV Area (Vmax): 2.05 cm    PV Vmax:       0.92 m/s AV Vmax:        105.00 cm/s PV Peak grad:  3.4 mmHg AV Peak Grad:   4.4 mmHg LVOT Vmax:      68.50 cm/s LVOT Vmean:     51.900 cm/s LVOT VTI:       0.146 m  AORTA Ao Root diam: 2.70 cm MITRAL VALVE               TRICUSPID VALVE MV Area (PHT): 2.73 cm    TV Peak grad:  29.8 mmHg MV Decel Time: 278 msec    TV Vmax:        2.73 m/s MV E velocity: 52.30 cm/s MV A velocity: 47.10 cm/s  SHUNTS MV E/A ratio:  1.11        Systemic VTI:  0.15 m                            Systemic Diam: 2.00 cm Samarth Ogle D Ceaser Ebeling MD Electronically signed by Yolonda Kida MD Signature Date/Time: 09/16/2019/11:11:40 AM    Final      Echo ejection fraction around 60% preserved left ventricular function  TELEMETRY: Normal sinus rhythm rate of 72:  ASSESSMENT AND PLAN:  Active Problems:   Acute on chronic respiratory failure with hypoxia (HCC) Intubated sedated Encephalopathy Sepsis Metabolic acidosis Hypoxemia Bradycardia COPD Smoking Hyperlipidemia DVT left arm . Plan Agree with ICU level care Continue  respiratory support Continue broad-spectrum antibiotic therapy for pneumonia Inhalers as necessary for COPD Anticoagulation for DVT Continue supportive management from a cardiac standpoint      Yolonda Kida, MD 09/17/2019 2:36 PM

## 2019-09-17 NOTE — Plan of Care (Signed)

## 2019-09-17 NOTE — Progress Notes (Signed)
Pt became more restless, agitated on bipap, transitioned to heated HFNC, stopped fentanyl gtt.  started precedex gtt, prn ativan given, tylenol suppository for fever as well as tylenol IV given. Hydralazine 10 given twice for htn given. Pt now resting more comfortably, will continue to monitor.

## 2019-09-17 NOTE — Progress Notes (Signed)
[facility] visited pt. per RN referral; pt.'s son Pamela James at bedside along w/dtr. Pamela James recently arrived from [place] to visit pt.  Son grateful for [facility] visit and spoke warmly of chaplain Webb's support when pt. brought to ED; son witnessed CPR on pt. in ED and this seems to have deeply impacted him.  Pt. lying in bed, confused per RN and family, but recently extubated and improved, per son.  Son brought pt. to live w/him 34yrs ago and spoke glowingly of how pt. 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  Son seems concerned re: pt.'s sudden change in health --> said she was planning a cruise and trip to [place] before events that led to this hospitalization.  Dtr. from [place] has reportedly not seen her mother in over 73yrs; she and son providing good support to pt.  No needs expressed at this time beyond prayer support; [facility] remains available as needed.    09/17/19 1430  Clinical Encounter Type  Visited With Patient and family together  Visit Type Initial;Social support;Psychological support;Critical Care  Referral From Nurse  Spiritual Encounters  Spiritual Needs Emotional  Stress Factors  Family Stress Factors Loss of control;Major life changes;Health changes

## 2019-09-17 NOTE — Progress Notes (Signed)
Assisted tele visit to patient with family member.  Leler Brion M, RN  

## 2019-09-17 NOTE — Consult Note (Signed)
Pharmacy Antibiotic Note  Pamela James is a 74 y.o. female admitted on 09/15/2019 with pneumonia.  Pharmacy has been consulted for Unasyn dosing.  Plan: Day 3 IV antibiotics. Unasyn 3 g q12H changed to Unasyn 3g q6H due to improvement in CrCl.   Height: 5\' 6"  (167.6 cm) Weight: 69.9 kg (154 lb 1.6 oz) IBW/kg (Calculated) : 59.3  Temp (24hrs), Avg:99.4 F (37.4 C), Min:96.8 F (36 C), Max:101.1 F (38.4 C)  Recent Labs  Lab 09/15/19 1513 09/15/19 1707 09/15/19 2325 09/16/19 0450 09/17/19 0541 09/17/19 0641  WBC 15.0*  --   --  16.0* 17.9*  --   CREATININE 2.64* 2.45* 2.44* 2.14*  --  1.30*  LATICACIDVEN 1.3  --   --   --   --   --     Estimated Creatinine Clearance: 35 mL/min (A) (by C-G formula based on SCr of 1.3 mg/dL (H)).    Allergies  Allergen Reactions  . Aspirin Other (See Comments)    GI ulcers  . Morphine And Related Other (See Comments)    Delerium, itching  . Nsaids Other (See Comments)    GI ulcers    Antimicrobials this admission: Ceftriaxone and azithromycin x 1  7/2 Unasyn >>    Microbiology results: 7/2 BCx: Pending   Thank you for allowing pharmacy to be a part of this patient's care.  Oswald Hillock, PharmD, BCPS 09/17/2019 2:34 PM

## 2019-09-17 NOTE — Progress Notes (Signed)
Subjective: Remains intubated and sedated but responds to pain  Objective: Current vital signs: BP (!) 137/54   Pulse 74   Temp (!) 100.6 F (38.1 C)   Resp 16   Ht _0  (1.676 m)   Wt 69.9 kg   SpO2 93%   BMI 24.87 kg/m  Vital signs in last 24 hours: Temp:  [96.8 F (36 C)-101.1 F (38.4 C)] 100.6 F (38.1 C) (07/04 0900) Pulse Rate:  [41-86] 74 (07/04 0900) Resp:  [15-22] 16 (07/04 0900) BP: (107-161)/(42-75) 137/54 (07/04 0900) SpO2:  [91 %-100 %] 93 % (07/04 0900) FiO2 (%):  [40 %-60 %] 50 % (07/04 0730)  Intake/Output from previous day: 07/03 0701 - 07/04 0700 In: 1565.6 [I.V.:1168.6; IV Piggyback:397] Out: 1500 [Urine:1500] Intake/Output this shift: Total I/O In: 143.1 [I.V.:43.1; IV Piggyback:100] Out: 175 [Urine:175] Nutritional status:  Diet Order            Diet NPO time specified  Diet effective now                 Neurologic Exam: Mental Status: Patient does not respond to verbal stimuli.  Grimaces to sternal rub.  Does not follow commands.  No verbalizations noted.  Cranial Nerves: II: patient does not respond confrontation bilaterally, pupils reactive bilaterally III,IV,VI: Oculocephalic response present bilaterally.  V,VII: corneal reflex reduced bilaterally  VIII: patient does not respond to verbal stimuli IX,X: gag reflex reduced, XI: trapezius strength unable to test bilaterally XII: tongue strength unable to test Motor: Extremities flaccid throughout.  No spontaneous movement noted.  No purposeful movements noted. Sensory: Does not respond to noxious stimuli in any extremity.  Lab Results: Basic Metabolic Panel: Recent Labs  Lab 09/15/19 1513 09/15/19 1513 09/15/19 1707 09/15/19 1707 09/15/19 1725 09/15/19 2325 09/16/19 0450 09/17/19 0541 09/17/19 0641  NA 132*  --  136  --   --  135 135  --  139  K >7.5*  --  6.1*  --   --  5.4* 5.1  --  4.6  CL 100  --  106  --   --  100 99  --  100  CO2 24  --  21*  --   --  26 27  --   28  GLUCOSE 129*  --  167*  --   --  160* 141*  --  94  BUN 54*  --  50*  --   --  58* 57*  --  45*  CREATININE 2.64*  --  2.45*  --   --  2.44* 2.14*  --  1.30*  CALCIUM 8.6*   < > 8.4*   < >  --  9.5 9.4  --  9.0  MG  --   --   --   --  1.9  --  2.0 1.9  --   PHOS  --   --   --   --  4.3  --  5.0*  --  4.4   < > = values in this interval not displayed.    Liver Function Tests: Recent Labs  Lab 09/15/19 1513 09/17/19 0641  AST 40  --   ALT 40  --   ALKPHOS 61  --   BILITOT 0.7  --   PROT 6.8  --   ALBUMIN 3.7 2.9*   Recent Labs  Lab 09/15/19 1725  LIPASE 29  AMYLASE 81   No results for input(s): AMMONIA in the last 168 hours.  CBC: Recent  Labs  Lab 09/15/19 1513 09/16/19 0450 09/17/19 0541  WBC 15.0* 16.0* 17.9*  NEUTROABS 10.5*  --  14.6*  HGB 10.7* 10.0* 8.2*  HCT 31.6* 28.4* 23.5*  MCV 96.9 92.8 93.6  PLT 696* 439* 369    Cardiac Enzymes: No results for input(s): CKTOTAL, CKMB, CKMBINDEX, TROPONINI in the last 168 hours.  Lipid Panel: Recent Labs  Lab 09/15/19 2325  TRIG 117    CBG: Recent Labs  Lab 09/16/19 1644 09/16/19 1649 09/16/19 2002 09/17/19 0351 09/17/19 0737  GLUCAP 396* 96 145* 90 41    Microbiology: Results for orders placed or performed during the hospital encounter of 09/15/19  Culture, blood (routine x 2)     Status: None (Preliminary result)   Collection Time: 09/15/19  3:13 PM   Specimen: BLOOD  Result Value Ref Range Status   Specimen Description   Final    BLOOD BLOOD LEFT HAND Performed at [facility], [address], [place], [place] [address]    Special Requests   Final    BOTTLES DRAWN AEROBIC AND ANAEROBIC Blood Culture results may not be optimal due to an excessive volume of blood received in culture bottles Performed at [facility], [address] [place], [place] [address]    Culture  Setup Time   Final    Organism ID to follow [place] TO, READ BACK BY AND VERIFIED WITH:  SHEEMA HALLAJI ON 09/16/2019 AT 1214 [place]  Final   Report Status PENDING  Incomplete  Blood Culture ID Panel (Reflexed)     Status: Abnormal   Collection Time: 09/15/19  3:13 PM  Result Value Ref Range Status   Enterococcus species NOT DETECTED NOT DETECTED Final   Listeria monocytogenes NOT DETECTED NOT DETECTED Final   Staphylococcus species DETECTED (A) NOT DETECTED Final    Comment: Methicillin (oxacillin) susceptible coagulase negative staphylococcus. Possible blood culture contaminant (unless isolated from more than one blood culture draw or clinical case suggests pathogenicity). No antibiotic treatment is indicated for blood  culture contaminants. CRITICAL RESULT CALLED TO, READ BACK BY AND VERIFIED WITH: SHEEMA HALLAJI ON 09/16/2019 AT 1214 [place]IK    Staphylococcus aureus (BCID) NOT DETECTED NOT DETECTED Final   Methicillin resistance NOT DETECTED NOT DETECTED Final   Streptococcus species NOT DETECTED NOT DETECTED Final   Streptococcus agalactiae NOT DETECTED NOT DETECTED Final   Streptococcus pneumoniae NOT DETECTED NOT DETECTED Final   Streptococcus pyogenes NOT DETECTED NOT DETECTED Final   Acinetobacter baumannii NOT DETECTED NOT DETECTED Final   Enterobacteriaceae species NOT DETECTED NOT DETECTED Final   Enterobacter cloacae complex NOT DETECTED NOT DETECTED Final   Escherichia coli NOT DETECTED NOT DETECTED Final   Klebsiella oxytoca NOT DETECTED NOT DETECTED Final   Klebsiella pneumoniae NOT DETECTED NOT DETECTED Final   Proteus species NOT DETECTED NOT DETECTED Final   Serratia marcescens NOT DETECTED NOT DETECTED Final   Haemophilus influenzae NOT DETECTED NOT DETECTED Final   Neisseria meningitidis NOT DETECTED NOT DETECTED Final   Pseudomonas aeruginosa NOT DETECTED NOT DETECTED Final   Candida albicans NOT DETECTED NOT DETECTED Final   Candida glabrata NOT DETECTED NOT DETECTED Final    Candida krusei NOT DETECTED NOT DETECTED Final   Candida parapsilosis NOT DETECTED NOT DETECTED Final   Candida tropicalis NOT DETECTED NOT DETECTED Final    Comment: Performed at [facility], [address], [place], [place] [address]  Culture, blood (routine x 2)     Status: None (Preliminary result)   Collection Time: 09/15/19  3:50 PM   Specimen: BLOOD  Result Value Ref Range Status   Specimen Description BLOOD LEFT ARM  Final   Special Requests   Final    BOTTLES DRAWN AEROBIC AND ANAEROBIC Blood Culture adequate volume   Culture   Final    NO GROWTH 2 DAYS Performed at [facility], [address] [place], [place] [address]    Report Status PENDING  Incomplete  SARS Coronavirus 2 by RT PCR (hospital order, performed in [place] hospital lab) Nasopharyngeal Nasopharyngeal Swab     Status: None   Collection Time: 09/15/19  3:55 PM   Specimen: Nasopharyngeal Swab  Result Value Ref Range Status   SARS Coronavirus 2 NEGATIVE NEGATIVE Final    Comment: (NOTE) SARS-CoV-2 target nucleic acids are NOT DETECTED.  The SARS-CoV-2 RNA is generally detectable in upper and lower respiratory specimens during the acute phase of infection. The lowest concentration of SARS-CoV-2 viral copies this assay can detect is 250 copies / mL. A negative result does not preclude SARS-CoV-2 infection and should not be used as the sole basis for treatment or other patient management decisions.  A negative result may occur with improper specimen collection / handling, submission of specimen other than nasopharyngeal swab, presence of viral mutation(s) within the areas targeted by this assay, and inadequate number of viral copies (<250 copies / mL). A negative result must be combined with clinical observations, patient history, and epidemiological information.  Fact Sheet for Patients:   StrictlyIdeas.no  Fact Sheet for Healthcare  Providers: BankingDealers.co.za  This test is not yet approved or  cleared by the [place] FDA and has been authorized for detection and/or diagnosis of SARS-CoV-2 by FDA under an Emergency Use Authorization (EUA).  This EUA will remain in effect (meaning this test can be used) for the duration of the COVID-19 declaration under Section 564(b)(1) of the Act, 21 U.S.C. section 360bbb-3(b)(1), unless the authorization is terminated or revoked sooner.  Performed at [facility], [address], [place], [place] [address]   Urine Culture     Status: None   Collection Time: 09/15/19  6:11 PM   Specimen: Urine, Random  Result Value Ref Range Status   Specimen Description   Final    URINE, RANDOM Performed at [facility], [address] [place], [place] [address]    Special Requests   Final    NONE Performed at [facility], [address], [place], [place] [address]    Culture   Final    NO GROWTH Performed at [facility], [place] [address], [place], [place] [address]    Report Status 09/17/2019 FINAL  Final  MRSA PCR Screening     Status: None   Collection Time: 09/15/19  6:51 PM   Specimen: Nasal Mucosa; Nasopharyngeal  Result Value Ref Range Status   MRSA by PCR NEGATIVE NEGATIVE Final    Comment:        The GeneXpert MRSA Assay (FDA approved for NASAL specimens only), is one component of a comprehensive MRSA colonization surveillance program. It is not intended to diagnose MRSA infection nor to guide or monitor treatment for MRSA infections. Performed at [facility], [address], [place],  [address]     Coagulation Studies: Recent Labs    09/15/19 05/27/10  LABPROT 13.9  INR 1.1    Imaging:  EEG  Result Date: 09/16/2019 Philemon Kingdom, MD     09/16/2019  7:56 PM Date of Study: 09/16/2019  Reason for Study: Pt is a 61 YOF with PMH of HTN, HLD, COPD/ASTHMA, MGUS, ANXIETY/DEPRESSION,  BIPOLAR D/O, hx of gross hematuria and left hydronephrosis, s/p left ureteroscopy 5/27/2,  malignant tumor of the left ureter who had acute hypoxic respiratory failure and AMS. Eval for seizures.  Description of recording: This recording was obtained using a Digital EEG system with 18 channel capacity . Standard bipolar and referential EEG montages were used following the International  10 - 20  System .  This is a digitally recorded EEG with duration of approximately ~21:17 minutes. The background consists of 7-8 hz alpha rhythm which attenuates with eye opening and closure.  There were no epileptiform discharges noted.  There were no recorded electrographic seizures. Hyperventilation was not performed. Photic stimulation was not performed. Sleep did not occur in this recording.   IMPRESSION: This is a normal EEG for age. There were no clinical seizures or epileptiform discharges noted during study. CLINICAL CORRELATION: This EEG finding did not support the diagnosis of seizure.  Clinical correlation is advised. A normal EEG does not rule out epilepsy, which is a clinical diagnosis.  I have personally reviewed this study.  DG Chest 1 View  Result Date: 09/15/2019 CLINICAL DATA:  Deoxygenation. EXAM: CHEST  1 VIEW COMPARISON:  September 15, 2019 FINDINGS: There is stable endotracheal tube and nasogastric tube positioning. Mild left basilar atelectasis and/or infiltrate is seen. This is decreased in severity when compared to the prior study. A very small left pleural effusion is also seen. This is also decreased in size. There is mild elevation of the left hemidiaphragm. No pneumothorax is identified. The heart size and mediastinal contours are within normal limits. There is marked severity calcification of the aortic arch. The visualized skeletal structures are unremarkable. The proximal end of a left-sided endo ureteral stent is seen within the visualized portion of the abdomen. IMPRESSION: Mild left basilar  atelectasis and/or infiltrate, decreased in size when compared to the prior study. Electronically Signed   By: Virgina Norfolk M.D.   On: 09/15/2019 17:58   DG Chest 1 View  Result Date: 09/15/2019 CLINICAL DATA:  Altered mental status. EXAM: CHEST  1 VIEW COMPARISON:  November 21, 2017. FINDINGS: There is interval development of diffuse left lung opacity concerning for atelectasis or pneumonia with some degree of volume loss causing mild right to left mediastinal shift. No pneumothorax is noted. Possible small left pleural effusion is noted. The right lung is hyperexpanded but otherwise normal. Bony thorax is unremarkable. IMPRESSION: Interval development of diffuse left lung opacity concerning for atelectasis or pneumonia with some degree of volume loss causing mild right to left mediastinal shift. Possible small left pleural effusion. Aortic Atherosclerosis (ICD10-I70.0). Electronically Signed   By: Marijo Conception M.D.   On: 09/15/2019 15:01   CT HEAD WO CONTRAST  Result Date: 09/16/2019 CLINICAL DATA:  Altered mental status, unclear cause EXAM: CT HEAD WITHOUT CONTRAST TECHNIQUE: Contiguous axial images were obtained from the base of the skull through the vertex without intravenous contrast. COMPARISON:  None. FINDINGS: Brain: Hypoattenuating focus in the left external capsule likely reflects a remote lacunar infarct. No evidence of acute infarction, hemorrhage, hydrocephalus, extra-axial collection or mass lesion/mass effect. Symmetric prominence of the ventricles, cisterns and sulci compatible with parenchymal volume loss. Patchy areas of white matter hypoattenuation are most compatible with chronic microvascular angiopathy. Senescent  mineralization seen in the basal ganglia. Vascular: Atherosclerotic calcification of the carotid siphons. No hyperdense vessel. Skull: No calvarial fracture or suspicious osseous lesion. No scalp swelling or hematoma. Sinuses/Orbits: Minimal thickening in the ethmoids.  Remaining paranasal sinuses and mastoid air cells are predominantly clear without pneumatized secretions or air-fluid levels. Hypo pneumatization of the frontal sinuses. Included orbital structures are unremarkable. Other: None IMPRESSION: 1. 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 2. No definite acute intracranial findings. 3. Chronic microvascular angiopathy and parenchymal volume loss. Electronically Signed   By: Lovena Le M.D.   On: 09/16/2019 00:11   US RENAL  Result Date: 09/16/2019 CLINICAL DATA:  Acute kidney failure. EXAM: RENAL / URINARY TRACT ULTRASOUND COMPLETE COMPARISON:  None. FINDINGS: Right Kidney: Renal measurements: 9.7 x 3.7 x 4.3 cm = volume: 81 mL. Mild increased cortical echogenicity. No hydronephrosis or focal mass. Small focus of right perinephric fluid. Left Kidney: Renal measurements: 10.0 x 4.8 x 4.9 cm = volume: 123 mL. Echogenicity within normal limits. No mass or hydronephrosis visualized. 1.8 cm lower pole cyst. Bladder: Decompressed with presence of Foley catheter. Other: None. IMPRESSION: 1. Normal size kidneys without hydronephrosis. Mild increased right sided cortical echogenicity which could be seen with medical renal disease. Small amount of adjacent right perinephric fluid. 2.  1.8 cm left renal cyst. Electronically Signed   By: Marin Olp M.D.   On: 09/16/2019 11:53   US Venous Img Upper Uni Left (DVT)  Result Date: 09/16/2019 CLINICAL DATA:  74 year old female with a history of mottled skin EXAM: LEFT UPPER EXTREMITY VENOUS DOPPLER ULTRASOUND TECHNIQUE: Gray-scale sonography with graded compression, as well as color Doppler and duplex ultrasound were performed to evaluate the upper extremity deep venous system from the level of the subclavian vein and including the jugular, axillary, basilic, radial, ulnar and upper cephalic vein. Spectral Doppler was utilized to evaluate flow at rest and with distal augmentation maneuvers. COMPARISON:  None.  FINDINGS: Contralateral Subclavian Vein: Respiratory phasicity is normal and symmetric with the symptomatic side. No evidence of thrombus. Normal compressibility. Internal Jugular Vein: No evidence of thrombus. Normal compressibility, respiratory phasicity and response to augmentation. Subclavian Vein: Partially occlusive thrombus of the left subclavian vein. Axillary Vein: Occlusive thrombus of the axillary vein, noncompressible. Cephalic Vein: Occlusive thrombus of the cephalic vein, noncompressible. Basilic Vein: Occlusive thrombus of the basilic vein, noncompressive. Brachial Veins: Paired Brachial veins, both of which demonstrate occlusive thrombus. Radial Veins: Occlusive thrombus Ulnar Veins: Occlusive thrombus Other Findings:  None visualized. IMPRESSION: Sonographic survey of the left upper extremity positive for DVT of the subclavian vein, axillary vein, and paired brachial veins. Occlusive of the axillary and paired brachial veins, and nonocclusive at the subclavian vein. Superficial occlusive thrombophlebitis of the cephalic vein, basilic vein, radial vein, and ulnar vein. Electronically Signed   By: Corrie Mckusick D.O.   On: 09/16/2019 12:59   DG Chest Port 1 View  Result Date: 09/17/2019 CLINICAL DATA:  74 year old female with history of respiratory failure. EXAM: PORTABLE CHEST 1 VIEW COMPARISON:  Chest x-ray 09/15/2019. FINDINGS: An endotracheal tube is in place with tip 4.7 cm above the carina. Left IJ Vas-Cath with tip terminating in the distal superior vena cava. Nasogastric tube with tip terminating in the proximal stomach. Incompletely imaged catheter in the left upper quadrant, likely to represent a left-sided ureteral stent. Lung volumes are normal. No consolidative airspace disease. No pleural effusions. No pneumothorax. No pulmonary nodule or mass noted. No evidence of pulmonary edema.  Heart size is borderline enlarged. Upper mediastinal contours are unremarkable. Atherosclerosis in the  thoracic aorta. IMPRESSION: 1. Support apparatus, as above. 2. No radiographic evidence of acute cardiopulmonary disease. 3. Aortic atherosclerosis. Electronically Signed   By: Vinnie Langton M.D.   On: 09/17/2019 04:57   DG Chest Port 1 View  Result Date: 09/15/2019 CLINICAL DATA:  Central line placement. EXAM: PORTABLE CHEST 1 VIEW COMPARISON:  September 15, 2019 (7:18 p.m.) FINDINGS: A left internal jugular venous catheter is seen. Its distal tip is noted within the distal aspect of the superior vena cava. This represents a new finding when compared to the prior study. There is stable endotracheal tube and nasogastric tube positioning. Mild, stable left lower lobe atelectasis and/or infiltrate is seen. No pleural effusion or pneumothorax is identified. The heart size and mediastinal contours are within normal limits. The visualized skeletal structures are unremarkable. IMPRESSION: 1. Interval left internal jugular venous catheter placement with its distal tip in the superior vena cava. 2. Stable left lower lobe atelectasis and/or infiltrate. Electronically Signed   By: Virgina Norfolk M.D.   On: 09/15/2019 23:10   DG Chest Port 1 View  Result Date: 09/15/2019 CLINICAL DATA:  Hypoxia EXAM: PORTABLE CHEST 1 VIEW COMPARISON:  09/15/2019 FINDINGS: Support devices are stable. Left lower lobe atelectasis or infiltrate with probable layering left effusion. Right lung clear. Mild hyperinflation. Heart is normal size. No acute bony abnormality. IMPRESSION: Hyperinflation. Continued left lower lobe atelectasis or infiltrate with suspected layering left effusion. Support devices stable. Electronically Signed   By: Rolm Baptise M.D.   On: 09/15/2019 19:33   DG Chest Portable 1 View  Result Date: 09/15/2019 CLINICAL DATA:  Intubated EXAM: PORTABLE CHEST 1 VIEW COMPARISON:  09/15/2019, 11/21/2017 FINDINGS: Interval intubation, tip of the endotracheal tube is about 2 cm superior to carina. Esophageal tube tip and side  port overlie the mid gastric region. Right lung is clear. Mediastinal shift to the left consistent with volume loss. Diffuse opacity in the left thorax likely due to combination of pleural effusion and airspace disease. IMPRESSION: 1. Endotracheal tube tip about 2 cm superior to carina. Esophageal tube tip and side port overlie the mid stomach. 2. Volume loss in the left thorax with diffuse opacity in the left thorax, likely due to combination of pleural effusion and airspace disease. Central obstructing lesion is a concern, consider correlation with contrast-enhanced chest CT. Electronically Signed   By: Donavan Foil M.D.   On: 09/15/2019 15:40   ECHOCARDIOGRAM COMPLETE  Result Date: 09/16/2019    ECHOCARDIOGRAM REPORT   Patient Name:   Pamela James Date of Exam: 09/16/2019 Medical Rec #:  [id number]  Height:       66.0 in Accession #:    [id number] Weight:       154.1 lb Date of Birth:  [birth year]   BSA:          1.790 m Patient Age:    30 years   BP:           122/52 mmHg Patient Gender: F          HR:           74 bpm. 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  Sonographer:     Arville Go RDCS Referring Phys:  [id number] Bradly Bienenstock Diagnosing Phys: Yolonda Kida MD  Sonographer Comments: Echo performed with patient supine and on artificial respirator. IMPRESSIONS  1. Left ventricular ejection fraction, by estimation, is 60 to 65%. The left ventricle has normal function. The left ventricle has no regional wall motion abnormalities. Left ventricular diastolic parameters were normal.  2. Right ventricular systolic function is normal. The right ventricular size is normal.  3. The mitral valve is grossly normal. No evidence of mitral valve regurgitation.  4. The aortic valve is grossly normal. Aortic valve regurgitation is not visualized. FINDINGS  Left  Ventricle: Left ventricular ejection fraction, by estimation, is 60 to 65%. The left ventricle has normal function. The left ventricle has no regional wall motion abnormalities. The left ventricular internal cavity size was normal in size. There is  borderline concentric left ventricular hypertrophy. Left ventricular diastolic parameters were normal. Right Ventricle: The right ventricular size is normal. No increase in right ventricular wall thickness. Right ventricular systolic function is normal. Left Atrium: Left atrial size was normal in size. Right Atrium: Right atrial size was normal in size. Pericardium: There is no evidence of pericardial effusion. Mitral Valve: The mitral valve is grossly normal. There is mild prolapse of of the mitral valve. There is mild thickening of the mitral valve leaflet(s). Normal mobility of the mitral valve leaflets. No evidence of mitral valve regurgitation. Tricuspid Valve: The tricuspid valve is normal in structure. Tricuspid valve regurgitation is trivial. Aortic Valve: The aortic valve is grossly normal. Aortic valve regurgitation is not visualized. Aortic valve peak gradient measures 4.4 mmHg. Pulmonic Valve: The pulmonic valve was grossly normal. Pulmonic valve regurgitation is not visualized. Aorta: The aortic root is normal in size and structure. IAS/Shunts: No atrial level shunt detected by color flow Doppler.  LEFT VENTRICLE PLAX 2D LVIDd:         3.91 cm  Diastology LVIDs:         2.69 cm  LV e' lateral:   7.29 cm/s LV PW:         1.09 cm  LV E/e' lateral: 7.2 LV IVS:        1.11 cm  LV e' medial:    5.33 cm/s LVOT diam:     2.00 cm  LV E/e' medial:  9.8 LV SV:         46 LV SV Index:   26 LVOT Area:     3.14 cm  RIGHT VENTRICLE RV Basal diam:  2.97 cm TAPSE (M-mode): 1.1 cm LEFT ATRIUM             Index       RIGHT ATRIUM          Index LA diam:        3.40 cm 1.90 cm/m  RA Area:     8.09 cm LA Vol (A2C):   16.3 ml 9.11 ml/m  RA Volume:   16.80 ml 9.39 ml/m LA  Vol (A4C):   20.1 ml 11.23 ml/m LA Biplane Vol: 18.9 ml 10.56 ml/m  AORTIC VALVE                PULMONIC VALVE AV Area (Vmax): 2.05 cm    PV Vmax:       0.92 m/s AV Vmax:        105.00 cm/s PV Peak grad:  3.4 mmHg AV Peak Grad:   4.4 mmHg LVOT Vmax:  68.50 cm/s LVOT Vmean:     51.900 cm/s LVOT VTI:       0.146 m  AORTA Ao Root diam: 2.70 cm MITRAL VALVE               TRICUSPID VALVE MV Area (PHT): 2.73 cm    TV Peak grad:   29.8 mmHg MV Decel Time: 278 msec    TV Vmax:        2.73 m/s MV E velocity: 52.30 cm/s MV A velocity: 47.10 cm/s  SHUNTS MV E/A ratio:  1.11        Systemic VTI:  0.15 m                            Systemic Diam: 2.00 cm Yolonda Kida MD Electronically signed by Yolonda Kida MD Signature Date/Time: 09/16/2019/11:11:40 AM    Final     Medications:  I have reviewed the patient's current medications. Scheduled: . aspirin  300 mg Rectal Daily  . chlorhexidine gluconate (MEDLINE KIT)  15 mL Mouth Rinse BID  . Chlorhexidine Gluconate Cloth  6 each Topical Daily  . docusate  100 mg Oral BID  . insulin aspart  0-9 Units Subcutaneous Q4H  . mouth rinse  15 mL Mouth Rinse 10 times per day  . pantoprazole (PROTONIX) IV  40 mg Intravenous QHS  . patiromer  16.8 g Oral Daily  . polyethylene glycol  17 g Oral Daily    Assessment/Plan: 74 y.o. 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 Pt was intubated in ED with suspected brief cardiac arrest likely due to hypoxia as O2 of 35 % on admission. Pt was found to have subclavian clot and is on heparin.  Although remains altered appears to be  showing some small signs of improvement on examination.  Head CT shows no acute changes.  EEG normal.  Current presentation likely secondary to a prolonged metabolic encephalopathy.  With above unremarkable testing will continue to follow clinically for now.  If no further improvement noted will consider further imaging at that time.    Will continue to follow with you.   LOS: 2 days   Alexis Goodell, MD Neurology [phone] 09/17/2019  10:07 AM

## 2019-09-17 NOTE — Consult Note (Signed)
[address] [place], [place] [address] Phone [phone]. Fax [phone]  Date: 09/17/2019                  Patient Name:  Pamela James  MRN: [id number]  DOB: 1945-08-06  Age / Sex: 74 y.o., female         PCP: Cindra Eves, MD                 Service Requesting Consult: IM/ Tyler Pita, MD                 Reason for Consult: ARF, hyperkalemia            Hospital course: Patient is a 74 y.o. female with medical problems of COPD, Asthma, GERD, MGUS, hydronephrosis of left kidney, anxiety, depression, who was admitted to [facility] on 09/15/2019 for evaluation of acute hypoxic resp failure, bradycardia.  She was intubated in the ER for airway protection  Today, she remains critically ill Ventilator assisted, FiO2 40% Sedated with fentanyl and propofol Also getting heparin drip 07/03 0701 - 07/04 0700 In: 1565.6 [I.V.:1168.6; IV Piggyback:397] Out: 1500 [Urine:1500] Serum creatinine has improved significantly to 1.30 Eyes are open.  Did not follow any commands today.   Current medications: Current Facility-Administered Medications  Medication Dose Route Frequency Provider Last Rate Last Admin  . 0.9 %  sodium chloride infusion   Intravenous PRN Bradly Bienenstock, NP   Stopped at 09/16/19 1109  . acetaminophen (TYLENOL) 160 MG/5ML solution 650 mg  650 mg Per Tube Q6H PRN Bradly Bienenstock, NP   650 mg at 09/17/19 0558  . Ampicillin-Sulbactam (UNASYN) 3 g in sodium chloride 0.9 % 100 mL IVPB  3 g Intravenous Q12H Oswald Hillock, [facility]   Stopped at 09/17/19 1224  . aspirin suppository 300 mg  300 mg Rectal Daily Leotis Pain, MD   300 mg at 09/17/19 0925  . chlorhexidine gluconate (MEDLINE KIT) (PERIDEX) 0.12 % solution 15 mL  15 mL Mouth Rinse BID Tyler Pita, MD   15 mL at 09/17/19 0758  . Chlorhexidine Gluconate Cloth 2 % PADS 6 each  6 each Topical Daily Bradly Bienenstock, NP   6 each at 09/17/19 0944  . dexmedetomidine (PRECEDEX) 400 MCG/100ML (4  mcg/mL) infusion  0.4-1.2 mcg/kg/hr Intravenous Titrated Tyler Pita, MD   Stopped at 09/16/19 2212  . docusate (COLACE) 50 MG/5ML liquid 100 mg  100 mg Oral BID Darel Hong D, NP   100 mg at 09/17/19 0925  . docusate sodium (COLACE) capsule 100 mg  100 mg Oral BID PRN Tyler Pita, MD      . DOPamine (INTROPIN) 800 mg in dextrose 5 % 250 mL (3.2 mg/mL) infusion  0-20 mcg/kg/min Intravenous Continuous Tyler Pita, MD   Stopped at 09/16/19 1006  . fentaNYL (SUBLIMAZE) injection 25 mcg  25 mcg Intravenous Q15 min PRN Darel Hong D, NP      . fentaNYL (SUBLIMAZE) injection 25-100 mcg  25-100 mcg Intravenous Q30 min PRN Darel Hong D, NP      . fentaNYL 2586mg in NS 2528m(1097mml) infusion-PREMIX  0-400 mcg/hr Intravenous Continuous GonTyler PitaD 2.5 mL/hr at 09/17/19 0936 25 mcg/hr at 09/17/19 0936  . heparin ADULT infusion 100 units/mL (25000 units/250m43mdium chloride 0.45%)  700 Units/hr Intravenous Continuous GonzTyler Pita 7 mL/hr at 09/17/19 0758 700 Units/hr at 09/17/19 0758  . insulin aspart (novoLOG) injection 0-9 Units  0-9 Units  Subcutaneous Q4H Darel Hong D, NP   1 Units at 09/16/19 2025  . levETIRAcetam (KEPPRA) IVPB 500 mg/100 mL premix  500 mg Intravenous Q12H Darel Hong D, NP 400 mL/hr at 09/17/19 0927 500 mg at 09/17/19 0927  . MEDLINE mouth rinse  15 mL Mouth Rinse 10 times per day Tyler Pita, MD   15 mL at 09/17/19 0944  . midazolam (VERSED) injection 1 mg  1 mg Intravenous Q15 min PRN Darel Hong D, NP      . midazolam (VERSED) injection 1 mg  1 mg Intravenous Q2H PRN Bradly Bienenstock, NP      . ondansetron [facility]) injection 4 mg  4 mg Intravenous Q6H PRN Tyler Pita, MD      . pantoprazole (PROTONIX) injection 40 mg  40 mg Intravenous QHS Tyler Pita, MD   40 mg at 09/16/19 2112  . patiromer Daryll Drown) packet 16.8 g  16.8 g Oral Daily Darel Hong D, NP   16.8 g at 09/16/19 1114  .  polyethylene glycol (MIRALAX / GLYCOLAX) packet 17 g  17 g Oral Daily PRN Tyler Pita, MD   17 g at 09/16/19 0857  . polyethylene glycol (MIRALAX / GLYCOLAX) packet 17 g  17 g Oral Daily Darel Hong D, NP   17 g at 09/17/19 0926  . propofol (DIPRIVAN) 1000 MG/100ML infusion  5-80 mcg/kg/min Intravenous Titrated Darel Hong D, NP 8.39 mL/hr at 09/17/19 0800 20 mcg/kg/min at 09/17/19 0800      Allergies: Allergies  Allergen Reactions  . Aspirin Other (See Comments)    GI ulcers  . Morphine And Related Other (See Comments)    Delerium, itching  . Nsaids Other (See Comments)    GI ulcers     Vital Signs: Blood pressure (!) 152/61, pulse 83, temperature 100.2 F (37.9 C), temperature source Bladder, resp. rate (!) 25, height _0  (1.676 m), weight 69.9 kg, SpO2 92 %.   Intake/Output Summary (Last 24 hours) at 09/17/2019 1017 Last data filed at 09/17/2019 0936 Gross per 24 hour  Intake 1708.71 ml  Output 1475 ml  Net 233.71 ml    Weight trends: Filed Weights   09/15/19 1440 09/15/19 1850  Weight: 77.1 kg 69.9 kg    Physical Exam: General:  Critically ill-appearing  HEENT  ETT, OGT  Neck:  No masses  Lungs:  Ventilator assisted, FiO2 40%  Heart::  Regular rhythm  Abdomen:  Soft, nontender  Extremities:  No edema  Neurologic:  Eyes open.  Did not follow commands  Skin:  No acute rashes  Access:  Left IJ dialysis catheter  Foley:  In place       Lab results: Basic Metabolic Panel: Recent Labs  Lab 09/15/19 1707 09/15/19 1725 09/15/19 2325 09/16/19 0450 09/17/19 0541 09/17/19 0641  NA   < >  --  135 135  --  139  K   < >  --  5.4* 5.1  --  4.6  CL   < >  --  100 99  --  100  CO2   < >  --  26 27  --  28  GLUCOSE   < >  --  160* 141*  --  94  BUN   < >  --  58* 57*  --  45*  CREATININE   < >  --  2.44* 2.14*  --  1.30*  CALCIUM   < >  --  9.5 9.4  --  9.0  MG  --  1.9  --  2.0 1.9  --   PHOS  --  4.3  --  5.0*  --  4.4   < > = values in this  interval not displayed.    Liver Function Tests: Recent Labs  Lab 09/15/19 1513 09/15/19 1513 09/17/19 0641  AST 40  --   --   ALT 40  --   --   ALKPHOS 61  --   --   BILITOT 0.7  --   --   PROT 6.8  --   --   ALBUMIN 3.7   < > 2.9*   < > = values in this interval not displayed.   Recent Labs  Lab 09/15/19 1725  LIPASE 29  AMYLASE 81   No results for input(s): AMMONIA in the last 168 hours.  CBC: Recent Labs  Lab 09/15/19 1513 09/15/19 1513 09/16/19 0450 09/17/19 0541  WBC 15.0*   < > 16.0* 17.9*  NEUTROABS 10.5*  --   --  14.6*  HGB 10.7*   < > 10.0* 8.2*  HCT 31.6*   < > 28.4* 23.5*  MCV 96.9   < > 92.8 93.6  PLT 696*   < > 439* 369   < > = values in this interval not displayed.    Cardiac Enzymes: No results for input(s): CKTOTAL, TROPONINI in the last 168 hours.  BNP: Invalid input(s): POCBNP  CBG: Recent Labs  Lab 09/16/19 1644 09/16/19 1649 09/16/19 2002 09/17/19 0351 09/17/19 0737  GLUCAP 396* 96 145* 90 89    Microbiology: Recent Results (from the past 720 hour(s))  Culture, blood (routine x 2)     Status: None (Preliminary result)   Collection Time: 09/15/19  3:13 PM   Specimen: BLOOD  Result Value Ref Range Status   Specimen Description   Final    BLOOD BLOOD LEFT HAND Performed at [facility], [address] [place], [place] [address]    Special Requests   Final    BOTTLES DRAWN AEROBIC AND ANAEROBIC Blood Culture results may not be optimal due to an excessive volume of blood received in culture bottles Performed at [facility], [address] [place], [place] [address]    Culture  Setup Time   Final    Organism ID to follow [place] TO, READ BACK BY AND VERIFIED WITH:  SHEEMA HALLAJI ON 09/16/2019 AT 1214 [place]  Final   Report Status PENDING  Incomplete  Blood Culture ID Panel (Reflexed)     Status: Abnormal    Collection Time: 09/15/19  3:13 PM  Result Value Ref Range Status   Enterococcus species NOT DETECTED NOT DETECTED Final   Listeria monocytogenes NOT DETECTED NOT DETECTED Final   Staphylococcus species DETECTED (A) NOT DETECTED Final    Comment: Methicillin (oxacillin) susceptible coagulase negative staphylococcus. Possible blood culture contaminant (unless isolated from more than one blood culture draw or clinical case suggests pathogenicity). No antibiotic treatment is indicated for blood  culture contaminants. CRITICAL RESULT CALLED TO, READ BACK BY AND VERIFIED WITH: SHEEMA HALLAJI ON 09/16/2019 AT 1214 TIK    Staphylococcus aureus (BCID) NOT DETECTED NOT DETECTED Final   Methicillin resistance NOT DETECTED NOT DETECTED Final   Streptococcus species NOT DETECTED NOT DETECTED Final   Streptococcus agalactiae NOT DETECTED NOT DETECTED Final   Streptococcus pneumoniae NOT DETECTED NOT DETECTED Final  Streptococcus pyogenes NOT DETECTED NOT DETECTED Final   Acinetobacter baumannii NOT DETECTED NOT DETECTED Final   Enterobacteriaceae species NOT DETECTED NOT DETECTED Final   Enterobacter cloacae complex NOT DETECTED NOT DETECTED Final   Escherichia coli NOT DETECTED NOT DETECTED Final   Klebsiella oxytoca NOT DETECTED NOT DETECTED Final   Klebsiella pneumoniae NOT DETECTED NOT DETECTED Final   Proteus species NOT DETECTED NOT DETECTED Final   Serratia marcescens NOT DETECTED NOT DETECTED Final   Haemophilus influenzae NOT DETECTED NOT DETECTED Final   Neisseria meningitidis NOT DETECTED NOT DETECTED Final   Pseudomonas aeruginosa NOT DETECTED NOT DETECTED Final   Candida albicans NOT DETECTED NOT DETECTED Final   Candida glabrata NOT DETECTED NOT DETECTED Final   Candida krusei NOT DETECTED NOT DETECTED Final   Candida parapsilosis NOT DETECTED NOT DETECTED Final   Candida tropicalis NOT DETECTED NOT DETECTED Final    Comment: Performed at [facility], [address] [place], [place] [address]  Culture, blood (routine x 2)     Status: None (Preliminary result)   Collection Time: 09/15/19  3:50 PM   Specimen: BLOOD  Result Value Ref Range Status   Specimen Description BLOOD LEFT ARM  Final   Special Requests   Final    BOTTLES DRAWN AEROBIC AND ANAEROBIC Blood Culture adequate volume   Culture   Final    NO GROWTH 2 DAYS Performed at [facility], [address] [place], [place] [address]    Report Status PENDING  Incomplete  SARS Coronavirus 2 by RT PCR (hospital order, performed in [place] hospital lab) Nasopharyngeal Nasopharyngeal Swab     Status: None   Collection Time: 09/15/19  3:55 PM   Specimen: Nasopharyngeal Swab  Result Value Ref Range Status   SARS Coronavirus 2 NEGATIVE NEGATIVE Final    Comment: (NOTE) SARS-CoV-2 target nucleic acids are NOT DETECTED.  The SARS-CoV-2 RNA is generally detectable in upper and lower respiratory specimens during the acute phase of infection. The lowest concentration of SARS-CoV-2 viral copies this assay can detect is 250 copies / mL. A negative result does not preclude SARS-CoV-2 infection and should not be used as the sole basis for treatment or other patient management decisions.  A negative result may occur with improper specimen collection / handling, submission of specimen other than nasopharyngeal swab, presence of viral mutation(s) within the areas targeted by this assay, and inadequate number of viral copies (<250 copies / mL). A negative result must be combined with clinical observations, patient history, and epidemiological information.  Fact Sheet for Patients:   StrictlyIdeas.no  Fact Sheet for Healthcare Providers: BankingDealers.co.za  This test is not yet approved or  cleared by the [place] FDA and has been authorized for detection and/or diagnosis of SARS-CoV-2 by FDA under an Emergency Use Authorization (EUA).   This EUA will remain in effect (meaning this test can be used) for the duration of the COVID-19 declaration under Section 564(b)(1) of the Act, 21 U.S.C. section 360bbb-3(b)(1), unless the authorization is terminated or revoked sooner.  Performed at [facility], [address], [place], [place] [address]   Urine Culture     Status: None   Collection Time: 09/15/19  6:11 PM   Specimen: Urine, Random  Result Value Ref Range Status   Specimen Description   Final    URINE, RANDOM Performed at [facility], [address] [place], [place] [address]    Special Requests   Final  NONE Performed at [facility], [address] [place], [place] [address]    Culture   Final    NO GROWTH Performed at [facility], [place] [address] [place], [place] [address]    Report Status 09/17/2019 FINAL  Final  MRSA PCR Screening     Status: None   Collection Time: 09/15/19  6:51 PM   Specimen: Nasal Mucosa; Nasopharyngeal  Result Value Ref Range Status   MRSA by PCR NEGATIVE NEGATIVE Final    Comment:        The GeneXpert MRSA Assay (FDA approved for NASAL specimens only), is one component of a comprehensive MRSA colonization surveillance program. It is not intended to diagnose MRSA infection nor to guide or monitor treatment for MRSA infections. Performed at [facility], [address] [place],  [address]      Coagulation Studies: Recent Labs    09/15/19 2010/05/21  LABPROT 13.9  INR 1.1    Urinalysis: Recent Labs    09/15/19 1811  COLORURINE AMBER*  LABSPEC 1.018  PHURINE 5.0  GLUCOSEU NEGATIVE  HGBUR LARGE*  BILIRUBINUR NEGATIVE  KETONESUR 5*  PROTEINUR 100*  NITRITE NEGATIVE  LEUKOCYTESUR TRACE*        Imaging: EEG  Result Date: 09/16/2019 Philemon Kingdom, MD     09/16/2019  7:56 PM Date of Study: 09/16/2019  Reason for Study: Pt is a 50 YOF with PMH of HTN, HLD, COPD/ASTHMA, MGUS, ANXIETY/DEPRESSION, BIPOLAR  D/O, hx of gross hematuria and left hydronephrosis, s/p left ureteroscopy 5/27/2,  malignant tumor of the left ureter who had acute hypoxic respiratory failure and AMS. Eval for seizures.  Description of recording: This recording was obtained using a Digital EEG system with 18 channel capacity . Standard bipolar and referential EEG montages were used following the International  10 - 20  System .  This is a digitally recorded EEG with duration of approximately ~21:17 minutes. The background consists of 7-8 hz alpha rhythm which attenuates with eye opening and closure.  There were no epileptiform discharges noted.  There were no recorded electrographic seizures. Hyperventilation was not performed. Photic stimulation was not performed. Sleep did not occur in this recording.   IMPRESSION: This is a normal EEG for age. There were no clinical seizures or epileptiform discharges noted during study. CLINICAL CORRELATION: This EEG finding did not support the diagnosis of seizure.  Clinical correlation is advised. A normal EEG does not rule out epilepsy, which is a clinical diagnosis.  I have personally reviewed this study.  DG Chest 1 View  Result Date: 09/15/2019 CLINICAL DATA:  Deoxygenation. EXAM: CHEST  1 VIEW COMPARISON:  September 15, 2019 FINDINGS: There is stable endotracheal tube and nasogastric tube positioning. Mild left basilar atelectasis and/or infiltrate is seen. This is decreased in severity when compared to the prior study. A very small left pleural effusion is also seen. This is also decreased in size. There is mild elevation of the left hemidiaphragm. No pneumothorax is identified. The heart size and mediastinal contours are within normal limits. There is marked severity calcification of the aortic arch. The visualized skeletal structures are unremarkable. The proximal end of a left-sided endo ureteral stent is seen within the visualized portion of the abdomen. IMPRESSION: Mild left basilar atelectasis  and/or infiltrate, decreased in size when compared to the prior study. Electronically Signed   By: Virgina Norfolk M.D.   On: 09/15/2019 17:58   DG Chest 1 View  Result Date: 09/15/2019 CLINICAL  DATA:  Altered mental status. EXAM: CHEST  1 VIEW COMPARISON:  November 21, 2017. FINDINGS: There is interval development of diffuse left lung opacity concerning for atelectasis or pneumonia with some degree of volume loss causing mild right to left mediastinal shift. No pneumothorax is noted. Possible small left pleural effusion is noted. The right lung is hyperexpanded but otherwise normal. Bony thorax is unremarkable. IMPRESSION: Interval development of diffuse left lung opacity concerning for atelectasis or pneumonia with some degree of volume loss causing mild right to left mediastinal shift. Possible small left pleural effusion. Aortic Atherosclerosis (ICD10-I70.0). Electronically Signed   By: Marijo Conception M.D.   On: 09/15/2019 15:01   CT HEAD WO CONTRAST  Result Date: 09/16/2019 CLINICAL DATA:  Altered mental status, unclear cause EXAM: CT HEAD WITHOUT CONTRAST TECHNIQUE: Contiguous axial images were obtained from the base of the skull through the vertex without intravenous contrast. COMPARISON:  None. FINDINGS: Brain: Hypoattenuating focus in the left external capsule likely reflects a remote lacunar infarct. No evidence of acute infarction, hemorrhage, hydrocephalus, extra-axial collection or mass lesion/mass effect. Symmetric prominence of the ventricles, cisterns and sulci compatible with parenchymal volume loss. Patchy areas of white matter hypoattenuation are most compatible with chronic microvascular angiopathy. Senescent mineralization seen in the basal ganglia. Vascular: Atherosclerotic calcification of the carotid siphons. No hyperdense vessel. Skull: No calvarial fracture or suspicious osseous lesion. No scalp swelling or hematoma. Sinuses/Orbits: Minimal thickening in the ethmoids. Remaining  paranasal sinuses and mastoid air cells are predominantly clear without pneumatized secretions or air-fluid levels. Hypo pneumatization of the frontal sinuses. Included orbital structures are unremarkable. Other: None IMPRESSION: 1. 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 2. No definite acute intracranial findings. 3. Chronic microvascular angiopathy and parenchymal volume loss. Electronically Signed   By: Lovena Le M.D.   On: 09/16/2019 00:11   US RENAL  Result Date: 09/16/2019 CLINICAL DATA:  Acute kidney failure. EXAM: RENAL / URINARY TRACT ULTRASOUND COMPLETE COMPARISON:  None. FINDINGS: Right Kidney: Renal measurements: 9.7 x 3.7 x 4.3 cm = volume: 81 mL. Mild increased cortical echogenicity. No hydronephrosis or focal mass. Small focus of right perinephric fluid. Left Kidney: Renal measurements: 10.0 x 4.8 x 4.9 cm = volume: 123 mL. Echogenicity within normal limits. No mass or hydronephrosis visualized. 1.8 cm lower pole cyst. Bladder: Decompressed with presence of Foley catheter. Other: None. IMPRESSION: 1. Normal size kidneys without hydronephrosis. Mild increased right sided cortical echogenicity which could be seen with medical renal disease. Small amount of adjacent right perinephric fluid. 2.  1.8 cm left renal cyst. Electronically Signed   By: Marin Olp M.D.   On: 09/16/2019 11:53   US Venous Img Upper Uni Left (DVT)  Result Date: 09/16/2019 CLINICAL DATA:  74 year old female with a history of mottled skin EXAM: LEFT UPPER EXTREMITY VENOUS DOPPLER ULTRASOUND TECHNIQUE: Gray-scale sonography with graded compression, as well as color Doppler and duplex ultrasound were performed to evaluate the upper extremity deep venous system from the level of the subclavian vein and including the jugular, axillary, basilic, radial, ulnar and upper cephalic vein. Spectral Doppler was utilized to evaluate flow at rest and with distal augmentation maneuvers. COMPARISON:  None. FINDINGS:  Contralateral Subclavian Vein: Respiratory phasicity is normal and symmetric with the symptomatic side. No evidence of thrombus. Normal compressibility. Internal Jugular Vein: No evidence of thrombus. Normal compressibility, respiratory phasicity and response to augmentation. Subclavian Vein: Partially occlusive thrombus of the left subclavian vein. Axillary Vein: Occlusive thrombus of  the axillary vein, noncompressible. Cephalic Vein: Occlusive thrombus of the cephalic vein, noncompressible. Basilic Vein: Occlusive thrombus of the basilic vein, noncompressive. Brachial Veins: Paired Brachial veins, both of which demonstrate occlusive thrombus. Radial Veins: Occlusive thrombus Ulnar Veins: Occlusive thrombus Other Findings:  None visualized. IMPRESSION: Sonographic survey of the left upper extremity positive for DVT of the subclavian vein, axillary vein, and paired brachial veins. Occlusive of the axillary and paired brachial veins, and nonocclusive at the subclavian vein. Superficial occlusive thrombophlebitis of the cephalic vein, basilic vein, radial vein, and ulnar vein. Electronically Signed   By: Corrie Mckusick D.O.   On: 09/16/2019 12:59   DG Chest Port 1 View  Result Date: 09/17/2019 CLINICAL DATA:  74 year old female with history of respiratory failure. EXAM: PORTABLE CHEST 1 VIEW COMPARISON:  Chest x-ray 09/15/2019. FINDINGS: An endotracheal tube is in place with tip 4.7 cm above the carina. Left IJ Vas-Cath with tip terminating in the distal superior vena cava. Nasogastric tube with tip terminating in the proximal stomach. Incompletely imaged catheter in the left upper quadrant, likely to represent a left-sided ureteral stent. Lung volumes are normal. No consolidative airspace disease. No pleural effusions. No pneumothorax. No pulmonary nodule or mass noted. No evidence of pulmonary edema. Heart size is borderline enlarged. Upper mediastinal contours are unremarkable. Atherosclerosis in the thoracic  aorta. IMPRESSION: 1. Support apparatus, as above. 2. No radiographic evidence of acute cardiopulmonary disease. 3. Aortic atherosclerosis. Electronically Signed   By: Vinnie Langton M.D.   On: 09/17/2019 04:57   DG Chest Port 1 View  Result Date: 09/15/2019 CLINICAL DATA:  Central line placement. EXAM: PORTABLE CHEST 1 VIEW COMPARISON:  September 15, 2019 (7:18 p.m.) FINDINGS: A left internal jugular venous catheter is seen. Its distal tip is noted within the distal aspect of the superior vena cava. This represents a new finding when compared to the prior study. There is stable endotracheal tube and nasogastric tube positioning. Mild, stable left lower lobe atelectasis and/or infiltrate is seen. No pleural effusion or pneumothorax is identified. The heart size and mediastinal contours are within normal limits. The visualized skeletal structures are unremarkable. IMPRESSION: 1. Interval left internal jugular venous catheter placement with its distal tip in the superior vena cava. 2. Stable left lower lobe atelectasis and/or infiltrate. Electronically Signed   By: Virgina Norfolk M.D.   On: 09/15/2019 23:10   DG Chest Port 1 View  Result Date: 09/15/2019 CLINICAL DATA:  Hypoxia EXAM: PORTABLE CHEST 1 VIEW COMPARISON:  09/15/2019 FINDINGS: Support devices are stable. Left lower lobe atelectasis or infiltrate with probable layering left effusion. Right lung clear. Mild hyperinflation. Heart is normal size. No acute bony abnormality. IMPRESSION: Hyperinflation. Continued left lower lobe atelectasis or infiltrate with suspected layering left effusion. Support devices stable. Electronically Signed   By: Rolm Baptise M.D.   On: 09/15/2019 19:33   DG Chest Portable 1 View  Result Date: 09/15/2019 CLINICAL DATA:  Intubated EXAM: PORTABLE CHEST 1 VIEW COMPARISON:  09/15/2019, 11/21/2017 FINDINGS: Interval intubation, tip of the endotracheal tube is about 2 cm superior to carina. Esophageal tube tip and side port  overlie the mid gastric region. Right lung is clear. Mediastinal shift to the left consistent with volume loss. Diffuse opacity in the left thorax likely due to combination of pleural effusion and airspace disease. IMPRESSION: 1. Endotracheal tube tip about 2 cm superior to carina. Esophageal tube tip and side port overlie the mid stomach. 2. Volume loss in the left thorax with diffuse opacity  in the left thorax, likely due to combination of pleural effusion and airspace disease. Central obstructing lesion is a concern, consider correlation with contrast-enhanced chest CT. Electronically Signed   By: Donavan Foil M.D.   On: 09/15/2019 15:40   ECHOCARDIOGRAM COMPLETE  Result Date: 09/16/2019    ECHOCARDIOGRAM REPORT   Patient Name:   JAY KEMPE Date of Exam: 09/16/2019 Medical Rec #:  [id number]  Height:       66.0 in Accession #:    [id number] Weight:       154.1 lb Date of Birth:  10-Dec-1945   BSA:          1.790 m Patient Age:    75 years   BP:           122/52 mmHg Patient Gender: F          HR:           74 bpm. 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  Sonographer:     Arville Go RDCS Referring Phys:  [id number] Bradly Bienenstock Diagnosing Phys: Yolonda Kida MD  Sonographer Comments: Echo performed with patient supine and on artificial respirator. IMPRESSIONS  1. Left ventricular ejection fraction, by estimation, is 60 to 65%. The left ventricle has normal function. The left ventricle has no regional wall motion abnormalities. Left ventricular diastolic parameters were normal.  2. Right ventricular systolic function is normal. The right ventricular size is normal.  3. The mitral valve is grossly normal. No evidence of mitral valve regurgitation.  4. The aortic valve is grossly normal. Aortic valve regurgitation is not visualized. FINDINGS  Left  Ventricle: Left ventricular ejection fraction, by estimation, is 60 to 65%. The left ventricle has normal function. The left ventricle has no regional wall motion abnormalities. The left ventricular internal cavity size was normal in size. There is  borderline concentric left ventricular hypertrophy. Left ventricular diastolic parameters were normal. Right Ventricle: The right ventricular size is normal. No increase in right ventricular wall thickness. Right ventricular systolic function is normal. Left Atrium: Left atrial size was normal in size. Right Atrium: Right atrial size was normal in size. Pericardium: There is no evidence of pericardial effusion. Mitral Valve: The mitral valve is grossly normal. There is mild prolapse of of the mitral valve. There is mild thickening of the mitral valve leaflet(s). Normal mobility of the mitral valve leaflets. No evidence of mitral valve regurgitation. Tricuspid Valve: The tricuspid valve is normal in structure. Tricuspid valve regurgitation is trivial. Aortic Valve: The aortic valve is grossly normal. Aortic valve regurgitation is not visualized. Aortic valve peak gradient measures 4.4 mmHg. Pulmonic Valve: The pulmonic valve was grossly normal. Pulmonic valve regurgitation is not visualized. Aorta: The aortic root is normal in size and structure. IAS/Shunts: No atrial level shunt detected by color flow Doppler.  LEFT VENTRICLE PLAX 2D LVIDd:         3.91 cm  Diastology LVIDs:         2.69 cm  LV e' lateral:   7.29 cm/s LV PW:         1.09 cm  LV E/e' lateral: 7.2 LV IVS:        1.11 cm  LV e'  medial:    5.33 cm/s LVOT diam:     2.00 cm  LV E/e' medial:  9.8 LV SV:         46 LV SV Index:   26 LVOT Area:     3.14 cm  RIGHT VENTRICLE RV Basal diam:  2.97 cm TAPSE (M-mode): 1.1 cm LEFT ATRIUM             Index       RIGHT ATRIUM          Index LA diam:        3.40 cm 1.90 cm/m  RA Area:     8.09 cm LA Vol (A2C):   16.3 ml 9.11 ml/m  RA Volume:   16.80 ml 9.39 ml/m LA  Vol (A4C):   20.1 ml 11.23 ml/m LA Biplane Vol: 18.9 ml 10.56 ml/m  AORTIC VALVE                PULMONIC VALVE AV Area (Vmax): 2.05 cm    PV Vmax:       0.92 m/s AV Vmax:        105.00 cm/s PV Peak grad:  3.4 mmHg AV Peak Grad:   4.4 mmHg LVOT Vmax:      68.50 cm/s LVOT Vmean:     51.900 cm/s LVOT VTI:       0.146 m  AORTA Ao Root diam: 2.70 cm MITRAL VALVE               TRICUSPID VALVE MV Area (PHT): 2.73 cm    TV Peak grad:   29.8 mmHg MV Decel Time: 278 msec    TV Vmax:        2.73 m/s MV E velocity: 52.30 cm/s MV A velocity: 47.10 cm/s  SHUNTS MV E/A ratio:  1.11        Systemic VTI:  0.15 m                            Systemic Diam: 2.00 cm Dwayne Prince Rome MD Electronically signed by Yolonda Kida MD Signature Date/Time: 09/16/2019/11:11:40 AM    Final      Assessment & Plan: Pt is a 74 y.o. African-American   female with  Anxiety, depression, History of suicidal attempt in the past Asthma, COPD GERD Hypertension History of total left hip arthroplasty  Hysterectomy History of adrenal adenoma MGUS History of Covid vaccination [facility] April 1921 Current smoker about 5 cigarettes/day  Recent history of gross hematuria, left hydronephrosis, status post left ureteroscopy on Aug 10, 2019 noted to have distal left ureteral tumor with inconclusive biopsy results.  September 12, 2019: Cystourethroscopy, ureteroscopy and resection of ureteral tumor by Dr. Kandee Keen at [facility].  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  Ureteral stent placed in the left collecting system under cystoscopy guidance  was admitted on 09/15/2019 with Hyperkalemia [E87.5] Bradycardia [R00.1] Respiratory distress [R06.03] Acute on chronic respiratory failure with hypoxia (HCC) [J96.21] Aspiration pneumonia of left lung, unspecified aspiration pneumonia type, unspecified part of lung ([place]) [J69.0]  #Acute kidney injury Baseline creatinine of 1.6 from Aug 07, 2019 AKI likely multifactorial but likely due to hypotension from concurrent cardiac events Patient is currently nonoliguric and serum creatinine started to improve No acute indication for dialysis at present Lab Results  Component Value Date   CREATININE 1.30 (H) 09/17/2019   CREATININE 2.14 (H) 09/16/2019  CREATININE 2.44 (H) 09/15/2019  Continue supportive care  # Hyperkalemia Likely due to ACE inhibitor in the setting of acute kidney injury Patient is now nonoliguric Potassium has improved with shifting measures Hold Lisinopril for now.  Can be restarted once creatinine is stable  #Acute respiratory failure Ventilator assisted FiO2 40% Concern about aspiration pneumonia Management as per ICU team   #Left hydronephrosis, urothelial tumor Recent stent placement at [facility] on June 29 From the right carotid shows normal-sized kidneys without hydronephrosis. Mildly increased right-sided cortical echogenicity small amount of right perinephric fluid.  1.8 cm left renal cyst.     LOS: 2 Shaqueta Casady Candiss Norse 7/4/202110:17 AM    Note: This note was prepared with Dragon dictation. Any transcription errors are unintentional

## 2019-09-18 DIAGNOSIS — G9341 Metabolic encephalopathy: Secondary | ICD-10-CM

## 2019-09-18 LAB — TRIGLYCERIDES: Triglycerides: 114 mg/dL (ref <150)

## 2019-09-18 LAB — COMPREHENSIVE METABOLIC PANEL
ALT: 116 U/L — ABNORMAL HIGH (ref 0–44)
AST: 60 U/L — ABNORMAL HIGH (ref 15–41)
Albumin: 3 g/dL — ABNORMAL LOW (ref 3.5–5.0)
Alkaline Phosphatase: 87 U/L (ref 38–126)
Anion gap: 8 (ref 5–15)
BUN: 32 mg/dL — ABNORMAL HIGH (ref 8–23)
CO2: 32 mmol/L (ref 22–32)
Calcium: 9.1 mg/dL (ref 8.9–10.3)
Chloride: 99 mmol/L (ref 98–111)
Creatinine, Ser: 1.13 mg/dL — ABNORMAL HIGH (ref 0.44–1.00)
GFR calc Af Amer: 55 mL/min — ABNORMAL LOW (ref >60)
GFR calc non Af Amer: 48 mL/min — ABNORMAL LOW (ref >60)
Glucose, Bld: 91 mg/dL (ref 70–99)
Potassium: 3.7 mmol/L (ref 3.5–5.1)
Sodium: 139 mmol/L (ref 135–145)
Total Bilirubin: 1.6 mg/dL — ABNORMAL HIGH (ref 0.3–1.2)
Total Protein: 5.9 g/dL — ABNORMAL LOW (ref 6.5–8.1)

## 2019-09-18 LAB — CBC
HCT: 23.5 % — ABNORMAL LOW (ref 36.0–46.0)
Hemoglobin: 8 g/dL — ABNORMAL LOW (ref 12.0–15.0)
MCH: 32 pg (ref 26.0–34.0)
MCHC: 34 g/dL (ref 30.0–36.0)
MCV: 94 fL (ref 80.0–100.0)
Platelets: 349 10*3/uL (ref 150–400)
RBC: 2.5 MIL/uL — ABNORMAL LOW (ref 3.87–5.11)
RDW: 15.5 % (ref 11.5–15.5)
WBC: 21 10*3/uL — ABNORMAL HIGH (ref 4.0–10.5)
nRBC: 0.1 % (ref 0.0–0.2)

## 2019-09-18 LAB — HEPARIN LEVEL (UNFRACTIONATED)
Heparin Unfractionated: 0.33 IU/mL (ref 0.30–0.70)
Heparin Unfractionated: 0.3 [IU]/mL (ref 0.30–0.70)

## 2019-09-18 LAB — GLUCOSE, CAPILLARY
Glucose-Capillary: 179 mg/dL — ABNORMAL HIGH (ref 70–99)
Glucose-Capillary: 84 mg/dL (ref 70–99)
Glucose-Capillary: 80 mg/dL (ref 70–99)
Glucose-Capillary: 83 mg/dL (ref 70–99)
Glucose-Capillary: 110 mg/dL — ABNORMAL HIGH (ref 70–99)
Glucose-Capillary: 70 mg/dL (ref 70–99)

## 2019-09-18 LAB — PHOSPHORUS: Phosphorus: 3 mg/dL (ref 2.5–4.6)

## 2019-09-18 LAB — LEGIONELLA PNEUMOPHILA SEROGP 1 UR AG: L. pneumophila Serogp 1 Ur Ag: NEGATIVE

## 2019-09-18 LAB — MAGNESIUM: Magnesium: 1.8 mg/dL (ref 1.7–2.4)

## 2019-09-18 MED ORDER — VANCOMYCIN HCL 1250 MG/250ML IV SOLN: 1250.0000 mg | INTRAVENOUS | Status: DC

## 2019-09-18 MED ORDER — VANCOMYCIN HCL IN DEXTROSE 1-5 GM/200ML-% IV SOLN
1000.0000 mg | INTRAVENOUS | Status: DC
  Filled 2019-09-18: qty 200

## 2019-09-18 MED ORDER — CHLORHEXIDINE GLUCONATE 0.12 % MT SOLN
OROMUCOSAL | Status: AC
  Filled 2019-09-18: qty 15

## 2019-09-18 MED ORDER — VANCOMYCIN HCL 1500 MG/300ML IV SOLN
1500.0000 mg | Freq: Once | INTRAVENOUS | Status: AC
  Administered 2019-09-18: 1500 mg via INTRAVENOUS
  Filled 2019-09-18: qty 300

## 2019-09-18 NOTE — Progress Notes (Signed)
[facility] Cardiology    SUBJECTIVE:  Intubated sedated with encephalopathy   Vitals:   09/18/19 1909 09/18/19 2000 09/18/19 2027 09/18/19 2100  BP:  (!) 150/65 (!) 150/65 (!) 166/44  Pulse:  82 79 86  Resp:  (!) 27 (!) 26 20  Temp:  (!) 101.7 F (38.7 C) (!) 101.8 F (38.8 C) (!) 102 F (38.9 C)  TempSrc:   Bladder   SpO2: 90% (!) 87% 92% 91%  Weight:      Height:         Intake/Output Summary (Last 24 hours) at 09/18/2019 2249 Last data filed at 09/18/2019 2030 Gross per 24 hour  Intake 1370.1 ml  Output 1600 ml  Net -229.9 ml      PHYSICAL EXAM  General: Well developed, well nourished, in no acute distress HEENT:  Normocephalic and atramatic Neck:  No JVD.  Lungs: Clear bilaterally to auscultation and percussion. Heart: HRRR . Normal S1 and S2 without gallops or murmurs.  Abdomen: Bowel sounds are positive, abdomen soft and non-tender  Msk:  Back normal, normal gait. Normal strength and tone for age. Extremities: No clubbing, cyanosis or edema.   Neuro:  Intubated sedated unresponsive Psych:  Good affect, responds appropriately   LABS: Basic Metabolic Panel: Recent Labs    09/16/19 0450 09/17/19 0541 09/17/19 0641 09/18/19 0500  NA   < >  --  139 139  K   < >  --  4.6 3.7  CL   < >  --  100 99  CO2   < >  --  28 32  GLUCOSE   < >  --  94 91  BUN   < >  --  45* 32*  CREATININE   < >  --  1.30* 1.13*  CALCIUM   < >  --  9.0 9.1  MG  --  1.9  --  1.8  PHOS   < >  --  4.4 3.0   < > = values in this interval not displayed.   Liver Function Tests: Recent Labs    09/17/19 0641 09/18/19 0500  AST  --  60*  ALT  --  116*  ALKPHOS  --  87  BILITOT  --  1.6*  PROT  --  5.9*  ALBUMIN 2.9* 3.0*   No results for input(s): LIPASE, AMYLASE in the last 72 hours. CBC: Recent Labs    09/17/19 0541 09/18/19 0500  WBC 17.9* 21.0*  NEUTROABS 14.6*  --   HGB 8.2* 8.0*  HCT 23.5* 23.5*  MCV 93.6 94.0  PLT 369 349   Cardiac Enzymes: No results for input(s):  CKTOTAL, CKMB, CKMBINDEX, TROPONINI in the last 72 hours. BNP: Invalid input(s): POCBNP D-Dimer: No results for input(s): DDIMER in the last 72 hours. Hemoglobin A1C: Recent Labs    09/16/19 0450  HGBA1C 5.4   Fasting Lipid Panel: Recent Labs    09/18/19 0913  TRIG 114   Thyroid Function Tests: No results for input(s): TSH, T4TOTAL, T3FREE, THYROIDAB in the last 72 hours.  Invalid input(s): FREET3 Anemia Panel: No results for input(s): VITAMINB12, FOLATE, FERRITIN, TIBC, IRON, RETICCTPCT in the last 72 hours.  DG Chest Port 1 View  Result Date: 09/17/2019 CLINICAL DATA:  74 year old female with history of respiratory failure. EXAM: PORTABLE CHEST 1 VIEW COMPARISON:  Chest x-ray 09/15/2019. FINDINGS: An endotracheal tube is in place with tip 4.7 cm above the carina. Left IJ Vas-Cath with tip terminating in the distal superior  vena cava. Nasogastric tube with tip terminating in the proximal stomach. Incompletely imaged catheter in the left upper quadrant, likely to represent a left-sided ureteral stent. Lung volumes are normal. No consolidative airspace disease. No pleural effusions. No pneumothorax. No pulmonary nodule or mass noted. No evidence of pulmonary edema. Heart size is borderline enlarged. Upper mediastinal contours are unremarkable. Atherosclerosis in the thoracic aorta. IMPRESSION: 1. Support apparatus, as above. 2. No radiographic evidence of acute cardiopulmonary disease. 3. Aortic atherosclerosis. Electronically Signed   By: Vinnie Langton M.D.   On: 09/17/2019 04:57     Echo  Normal left ventricular function ejection fraction of roughly 60%  TELEMETRY:  Normal sinus rhythm rate of about 70  ASSESSMENT AND PLAN:  Active Problems:   Acute on chronic respiratory failure with hypoxia (HCC) Bradycardia  encephalopathy  COPD exacerbation  acute hypoxic respiratory failure  sepsis  metabolic acidosis  acute renal insufficiency  left urethral tumor  DVT left  arm   plan  agree with supportive care in a critical care  continue broad-spectrum antibiotic therapy for sepsis  recommend continue hydration for metabolic acidosis  respiratory support for hypoxic respiratory failure  bradycardia may be secondary to medication and sedated medicines  acute renal insufficiency followed by Nephrology  agree with anticoagulation for DVT in the arm  persistent encephalopathy followed by Neurology continue current management  hypotension and extreme bradycardia resolves now off of dopamine  do not recommend any invasive cardiac procedures at this point   Yolonda Kida, MD 09/18/2019 10:49 PM

## 2019-09-18 NOTE — Progress Notes (Signed)
Patient restless this shift, removing HFNC from nares and then desatting. Precedex increased to max ordered dose to keep patient form removing HFNC. RT at bedside multiple times to assist with adjustments to HFNC. Patient denies pain.Heparin drip infusing per order with an increase ordered per pharmacy form 8 to 9.5 ml/hr. Patient is confused but cooperative and a high fall risk,

## 2019-09-18 NOTE — Progress Notes (Signed)
Pharmacy Antibiotic Note  Pamela James is a 74 y.o. female admitted on 09/15/2019 with pneumonia.  Pharmacy has been consulted for vancomycin dosing. Patient will received vanc 1.5g IV load.  Plan: Will continue vanc 1.25g IV q24h and will check a vanc trough 07/09 @ 0600   Will continue to monitor and adjust doses per changes in renal function and/or non therapeutic trough.  Ke 0.0379 T1/2 18 ~ 24 hrs and increase dose to vanc 1.25g. Goal trough 15 - 20 mcg/mL.  Height: 5\' 6"  (167.6 cm) Weight: 69.9 kg (154 lb 1.6 oz) IBW/kg (Calculated) : 59.3  Temp (24hrs), Avg:100.6 F (38.1 C), Min:99.1 F (37.3 C), Max:102 F (38.9 C)  Recent Labs  Lab 09/15/19 1513 09/15/19 1513 09/15/19 1707 09/15/19 2325 09/16/19 0450 09/17/19 0541 09/17/19 0641 09/18/19 0500  WBC 15.0*  --   --   --  16.0* 17.9*  --  21.0*  CREATININE 2.64*   < > 2.45* 2.44* 2.14*  --  1.30* 1.13*  LATICACIDVEN 1.3  --   --   --   --   --   --   --    < > = values in this interval not displayed.    Estimated Creatinine Clearance: 40.3 mL/min (A) (by C-G formula based on SCr of 1.13 mg/dL (H)).    Allergies  Allergen Reactions  . Aspirin Other (See Comments)    GI ulcers  . Morphine And Related Other (See Comments)    Delerium, itching  . Nsaids Other (See Comments)    GI ulcers    Thank you for allowing pharmacy to be a part of this patient's care.  Tobie Lords, PharmD, BCPS Clinical Pharmacist 09/18/2019 6:55 AM

## 2019-09-18 NOTE — Evaluation (Signed)
Clinical/Bedside Swallow Evaluation Patient Details  Name: Pamela James MRN: [id number] Date of Birth: 12-23-1945  Today's Date: 09/18/2019 Time: SLP Start Time (ACUTE ONLY): 29 SLP Stop Time (ACUTE ONLY): 1600 SLP Time Calculation (min) (ACUTE ONLY): 55 min  Past Medical History:  Past Medical History:  Diagnosis Date  . Anxiety   . Asthma   . COPD (chronic obstructive pulmonary disease) ([place])   . Depression   . GERD (gastroesophageal reflux disease)   . Hypertension    Past Surgical History:  Past Surgical History:  Procedure Laterality Date  . ABDOMINAL HYSTERECTOMY    . TOTAL HIP ARTHROPLASTY Left   . TUBAL LIGATION     HPI:  Pt is a 74 year old who presented with acute on chronic respiratory failure and severe metabolic abnormalities.  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  It appears she had received some narcotic medication for pain.  She was noted to be in respiratory distress and failure on arrival to the emergency room.  She was clearly altered and in significant distress and required emergent intubation.  Subsequently she had a brief episode of arrest where she was noted to be bradycardic and hypotensive.  She has severe metabolic abnormalities to include severe hyperkalemia.  She also has what appears to be acute on chronic renal failure.  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 She had reportedly been doing well since her procedure, but had been taking oxycodone regularly for pain.  Pt has a past medical history of hypertension, COPD, anxiety/depression, and GERD.  Dtr present, and pt, denied any swallowing problems at home.  Per MD note, Seizure like activity noted; pt started on Keppra. Pt was briefly orally intubated x2 days~.    Assessment / Plan / Recommendation Clinical  Impression  Pt appears to present w/ adequate oropharyngeal phase swallow w/ No oropharyngeal phase dysphagia noted, No neuromuscular deficits noted w/ oral intake.Pt consumed po trials w/ No overt, clinical s/s of aspiration during po trials. Pt appears at reduced risk for aspiration following general aspiration precautions, and given rest breaks as needed to reduce in SOB/WOB w/ the exertion of po tasks, swallowing. Pt is currently on HFNC for O2 support. She also needs support and assistance w/ feeding, positioning upright for po's. During po trials, pt consumed all trials/consistencies of thin liquids, ice chips, and purees w/ no overt coughing, decline in vocal quality, or significant change in respiratory presentation during/post trials. O2 sats remained in low-mid 90s. Pt was fed slowly w/ min rest breaks in between bites/sips. Oral phase appeared [facility] w/ timely bolus management and control of bolus propulsion for A-P transfer for swallowing. Oral clearing achieved w/ all trial consistencies. No solids were given d/t pt not having her Dentures in place - family will bring them in tomorrow to let pt practice wearing again for trials to upgrade consistency next 1-2 days. OM Exam appeared [facility] w/ no unilateral weakness noted. Speech Clear. Pt helped to hold cup w/ support. Recommend a Puree consistency diet w/ gravies added; Thin liquids. Recommend general aspiration precautions, Pills Crushed in Puree for safer, easier swallowing; Rest Breaks to lessen SOB/WOB w/ exertion of po tasks/intake. Education given on Pills in Puree; food consistencies and easy to eat options; general aspiration precautions to pt/Dtr. NSG agreed. MD updated.  SLP Visit Diagnosis: Dysphagia,  unspecified (R13.10) (lacking Dentures at this time)    Aspiration Risk  Mild aspiration risk;Risk for inadequate nutrition/hydration (reduced following precautions)    Diet Recommendation  Pureed diet w/ thin liquids w/ gravies added to  moisten; general aspiration precautions including Rest Breaks d/t exertion of tasks. Feeding Support and monitoring w/ all oral intake.  Medication Administration: Crushed with puree (for safer swallowing)    Other  Recommendations Recommended Consults:  (Dietician f/u) Oral Care Recommendations: Oral care BID;Oral care before and after PO;Staff/trained caregiver to provide oral care Other Recommendations:  (n/a)   Follow up Recommendations None      Frequency and Duration min 2x/week  1 week       Prognosis Prognosis for Safe Diet Advancement: Good Barriers to Reach Goals:  (n/a)      Swallow Study   General Date of Onset: 09/15/19 HPI: Pt is a 74 year old who presented with acute on chronic respiratory failure and severe metabolic abnormalities.  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  It appears she had received some narcotic medication for pain.  She was noted to be in respiratory distress and failure on arrival to the emergency room.  She was clearly altered and in significant distress and required emergent intubation.  Subsequently she had a brief episode of arrest where she was noted to be bradycardic and hypotensive.  She has severe metabolic abnormalities to include severe hyperkalemia.  She also has what appears to be acute on chronic renal failure.  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 She had reportedly been doing well since her procedure, but had been taking oxycodone regularly for pain.  Pt has a past medical history of hypertension, COPD, anxiety/depression, and GERD.  Dtr present, and pt, denied any swallowing problems at home.  Per MD note, Seizure like activity noted; pt started on Keppra. Pt was briefly orally intubated x2 days~.  Type of Study: Bedside Swallow  Evaluation Previous Swallow Assessment: none reported Diet Prior to this Study: NPO Temperature Spikes Noted: No (wbc elevated currently ) Respiratory Status: Nasal cannula (40L w/ FiO2 43% - HFNC) History of Recent Intubation: Yes Length of Intubations (days): 2 days Date extubated: 09/17/19 Behavior/Cognition: Alert;Cooperative;Pleasant mood;Distractible;Requires cueing (min) Oral Cavity Assessment: Dry (min) Oral Care Completed by SLP: Yes Oral Cavity - Dentition: Edentulous (Dentures at home) Vision: Functional for self-feeding Self-Feeding Abilities: Able to feed self;Needs assist;Needs set up;Total assist (held cup briefly) Patient Positioning: Upright in bed (needed positioning support) Baseline Vocal Quality: Normal;Low vocal intensity (min) Volitional Cough: Strong;Congested Volitional Swallow: Able to elicit    Oral/Motor/Sensory Function Overall Oral Motor/Sensory Function: Within functional limits   Ice Chips Ice chips: Within functional limits Presentation: Spoon (fed; 4 trials)   Thin Liquid Thin Liquid: Within functional limits Presentation: Spoon;Straw (fed; 10+ trials) Other Comments: water, juice, ensure    Nectar Thick Nectar Thick Liquid: Not tested   Honey Thick Honey Thick Liquid: Not tested   Puree Puree: Within functional limits Presentation: Spoon (fed; 10 trials)   Solid     Solid: Not tested Other Comments: edentulous, weakness currently       Orinda Kenner, MS, CCC-SLP Pamela James 09/18/2019,4:11 PM

## 2019-09-18 NOTE — Progress Notes (Signed)
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  SIGNIFICANT EVENTS  7/2-Presented to ED 7/2-Transient loss of pulse and hypoxia on the vent requiring manual bag ventilation and CPR with ROSC 7/2-Started on Dopamine Gtt 7/3-Remains intubated and sedated 7/3-Seizure like activity noted overnight started on Keppra 7/4-Remain intubated and mechanically ventillated 09/18/19- no overnight events, weaning precedex drip, optimizing for downgrade from ICU to Stepdown  STUDIES:  7/2- CXR>>interval development of diffuse left lung opacity concerning for atelectasis or pneumonia with some degree of volume loss causing mild right to left mediastinal shift 7/2- CT head>> No acute intracranial abnormality 7/3- US Renal>> Pending 7/3-US Venous LUE>> 7/3- EEG>>Shows no evidence of seizure activity 7/4-Chest xray show no evidence of acute cardiopulmonary disease  CULTURES: SARS-CoV-2 PCR 7/2>> Negative MRSA PCR 7/2>>Negative Blood culture 7/2>BCx 1 of 4 GPC. BCID: staph species.  Urine culture 7/2>No growth Strep Pneumo Urinary antigen 7/2>> Negative Legionella Pneumo Ur Ag 7/2> Pending  ANTIBIOTICS: Unasyn 7/2>>  PAST MEDICAL HISTORY :   has a past medical history of Anxiety, Asthma, COPD  (chronic obstructive pulmonary disease) ([place]), Depression, GERD (gastroesophageal reflux disease), and Hypertension.  has a past surgical history that includes Tubal ligation; Total hip arthroplasty (Left); and Abdominal hysterectomy.   Prior to Admission medications   Medication Sig Start Date End Date Taking? Authorizing Provider  acetaminophen (TYLENOL) 500 MG tablet Take 500-1,000 mg by mouth every 6 (six) hours as needed for moderate pain or fever.     [provider]  albuterol (ACCUNEB) 1.25 MG/3ML nebulizer solution Take 1 ampule by nebulization every 6 (six) hours as needed for wheezing or shortness of breath.    [provider]  albuterol (PROVENTIL HFA;VENTOLIN HFA) 108 (90 Base) MCG/ACT inhaler Inhale 2 puffs into the lungs every 6 (six) hours as needed for wheezing or shortness of breath.    [provider]  amLODipine (NORVASC) 10 MG tablet Take 10 mg by mouth daily.    [provider]  aspirin EC 81 MG EC tablet Take 1 tablet (81 mg total) by mouth daily. 03/27/16   Hillary Bow, MD  carvedilol (COREG) 12.5 MG tablet Take 1 tablet (12.5 mg total) by mouth 2 (two) times daily with a meal. 11/23/17   Mody, Sital, MD  Fluticasone-Salmeterol (ADVAIR) 250-50 MCG/DOSE AEPB Inhale 1 puff into the lungs every 12 (twelve) hours.     [provider]  ipratropium (ATROVENT HFA) 17 MCG/ACT inhaler Inhale 2 puffs into the lungs 3 (three) times daily.     [provider]  lisinopril (PRINIVIL,ZESTRIL) 40 MG tablet Take 40 mg by mouth daily.    [provider]  nicotine (NICODERM CQ - DOSED IN MG/24 HOURS) 21 mg/24hr patch Place 1 patch (21 mg total) onto the skin daily. 11/23/17   Bettey Costa, MD  omeprazole (PRILOSEC) 20 MG capsule Take 20 mg by mouth daily.  [provider]  pravastatin (PRAVACHOL) 20 MG tablet Take 20 mg by mouth every evening.     [provider]  ranitidine (ZANTAC) 150 MG capsule Take 150 mg by  mouth 2 (two) times daily.    [provider]   Scheduled Meds: . aspirin  300 mg Rectal Daily  . chlorhexidine gluconate (MEDLINE KIT)  15 mL Mouth Rinse BID  . Chlorhexidine Gluconate Cloth  6 each Topical Daily  . docusate  100 mg Oral BID  . insulin aspart  0-9 Units Subcutaneous Q4H  . ipratropium-albuterol  3 mL Nebulization Q4H  . mouth rinse  15 mL Mouth Rinse 10 times per day  . pantoprazole (PROTONIX) IV  40 mg Intravenous QHS  . polyethylene glycol  17 g Oral Daily   Continuous Infusions: . sodium chloride Stopped (09/18/19 0815)  . acetaminophen Stopped (09/18/19 0655)  . ampicillin-sulbactam (UNASYN) IV Stopped (09/18/19 0728)  . dexmedetomidine (PRECEDEX) IV infusion 0.7 mcg/kg/hr (09/18/19 0836)  . DOPamine Stopped (09/16/19 1006)  . heparin 950 Units/hr (09/18/19 0836)  . [START ON 09/19/2019] vancomycin    . vancomycin 150 mL/hr at 09/18/19 0836   PRN Meds:.sodium chloride, acetaminophen (TYLENOL) oral liquid 160 mg/5 mL, acetaminophen, docusate sodium, fentaNYL (SUBLIMAZE) injection, hydrALAZINE, ondansetron (ZOFRAN) IV, polyethylene glycol   Allergies  Allergen Reactions  . Aspirin Other (See Comments)    GI ulcers  . Morphine And Related Other (See Comments)    Delerium, itching  . Nsaids Other (See Comments)    GI ulcers    FAMILY HISTORY:  family history includes Heart disease in her father and sister. SOCIAL HISTORY:  reports that she has been smoking. She has never used smokeless tobacco. She reports that she does not drink alcohol and does not use drugs.  REVIEW OF SYSTEMS:   UNABLE TO ASSESS DUE TO INTUBATION AND SEDATED  VITAL SIGNS: Temp:  [99 F (37.2 C)-102 F (38.9 C)] 99 F (37.2 C) (07/05 0800) Pulse Rate:  [52-95] 61 (07/05 0800) Resp:  [13-26] 22 (07/05 0800) BP: (104-187)/(40-80) 128/80 (07/05 0800) SpO2:  [89 %-99 %] 96 % (07/05 0800) FiO2 (%):  [40 %-75 %] 50 % (07/05 0800)  PHYSICAL EXAMINATION: GENERAL: 74 year old  patient lying in the bed on intubated and mechanically ventillated EYES:  No scleral icterus. Extraocular muscles intact.  HEENT: Head atraumatic, normocephalic. Oropharynx and nasopharynx clear.  NECK:  Supple, no jugular venous distention. No thyroid enlargement, no tenderness.  LUNGS: Decreased breath sounds bilaterally, no wheezing, rales,rhonchi or crepitation. No use of accessory muscles of respiration.  CARDIOVASCULAR: S1, S2 normal. No murmurs, rubs, or gallops.  ABDOMEN: Soft, nontender, nondistended. Bowel sounds present. No organomegaly or mass.  EXTREMITIES: Generalized dependent edema, cyanosis, or clubbing.   NEUROLOGIC: still encephalopathic on sedation.  PSYCHIATRIC: Unable to assess.  SKIN: No obvious rash, lesion, or ulcer.   Recent Labs  Lab 09/16/19 0450 09/17/19 0641 09/18/19 0500  NA 135 139 139  K 5.1 4.6 3.7  CL 99 100 99  CO2 27 28 32  BUN 57* 45* 32*  CREATININE 2.14* 1.30* 1.13*  GLUCOSE 141* 94 91   Recent Labs  Lab 09/16/19 0450 09/17/19 0541 09/18/19 0500  HGB 10.0* 8.2* 8.0*  HCT 28.4* 23.5* 23.5*  WBC 16.0* 17.9* 21.0*  PLT 439* 369 349   EEG  Result Date: 09/16/2019 Philemon Kingdom, MD     09/16/2019  7:56 PM Date of Study: 09/16/2019  Reason for Study: Pt is a 29  YOF with PMH of HTN, HLD, COPD/ASTHMA, MGUS, ANXIETY/DEPRESSION, BIPOLAR D/O, hx of gross hematuria and left hydronephrosis, s/p left ureteroscopy 5/27/2,  malignant tumor of the left ureter who had acute hypoxic respiratory failure and AMS. Eval for seizures.  Description of recording: This recording was obtained using a Digital EEG system with 18 channel capacity . Standard bipolar and referential EEG montages were used following the International  10 - 20  System .  This is a digitally recorded EEG with duration of approximately ~21:17 minutes. The background consists of 7-8 hz alpha rhythm which attenuates with eye opening and closure.  There were no epileptiform discharges noted.   There were no recorded electrographic seizures. Hyperventilation was not performed. Photic stimulation was not performed. Sleep did not occur in this recording.   IMPRESSION: This is a normal EEG for age. There were no clinical seizures or epileptiform discharges noted during study. CLINICAL CORRELATION: This EEG finding did not support the diagnosis of seizure.  Clinical correlation is advised. A normal EEG does not rule out epilepsy, which is a clinical diagnosis.  I have personally reviewed this study.  US RENAL  Result Date: 09/16/2019 CLINICAL DATA:  Acute kidney failure. EXAM: RENAL / URINARY TRACT ULTRASOUND COMPLETE COMPARISON:  None. FINDINGS: Right Kidney: Renal measurements: 9.7 x 3.7 x 4.3 cm = volume: 81 mL. Mild increased cortical echogenicity. No hydronephrosis or focal mass. Small focus of right perinephric fluid. Left Kidney: Renal measurements: 10.0 x 4.8 x 4.9 cm = volume: 123 mL. Echogenicity within normal limits. No mass or hydronephrosis visualized. 1.8 cm lower pole cyst. Bladder: Decompressed with presence of Foley catheter. Other: None. IMPRESSION: 1. Normal size kidneys without hydronephrosis. Mild increased right sided cortical echogenicity which could be seen with medical renal disease. Small amount of adjacent right perinephric fluid. 2.  1.8 cm left renal cyst. Electronically Signed   By: Marin Olp M.D.   On: 09/16/2019 11:53   US Venous Img Upper Uni Left (DVT)  Result Date: 09/16/2019 CLINICAL DATA:  74 year old female with a history of mottled skin EXAM: LEFT UPPER EXTREMITY VENOUS DOPPLER ULTRASOUND TECHNIQUE: Gray-scale sonography with graded compression, as well as color Doppler and duplex ultrasound were performed to evaluate the upper extremity deep venous system from the level of the subclavian vein and including the jugular, axillary, basilic, radial, ulnar and upper cephalic vein. Spectral Doppler was utilized to evaluate flow at rest and with distal augmentation  maneuvers. COMPARISON:  None. FINDINGS: Contralateral Subclavian Vein: Respiratory phasicity is normal and symmetric with the symptomatic side. No evidence of thrombus. Normal compressibility. Internal Jugular Vein: No evidence of thrombus. Normal compressibility, respiratory phasicity and response to augmentation. Subclavian Vein: Partially occlusive thrombus of the left subclavian vein. Axillary Vein: Occlusive thrombus of the axillary vein, noncompressible. Cephalic Vein: Occlusive thrombus of the cephalic vein, noncompressible. Basilic Vein: Occlusive thrombus of the basilic vein, noncompressive. Brachial Veins: Paired Brachial veins, both of which demonstrate occlusive thrombus. Radial Veins: Occlusive thrombus Ulnar Veins: Occlusive thrombus Other Findings:  None visualized. IMPRESSION: Sonographic survey of the left upper extremity positive for DVT of the subclavian vein, axillary vein, and paired brachial veins. Occlusive of the axillary and paired brachial veins, and nonocclusive at the subclavian vein. Superficial occlusive thrombophlebitis of the cephalic vein, basilic vein, radial vein, and ulnar vein. Electronically Signed   By: Corrie Mckusick D.O.   On: 09/16/2019 12:59   DG Chest Port 1 View  Result Date: 09/17/2019 CLINICAL DATA:  74 year old female with  history of respiratory failure. EXAM: PORTABLE CHEST 1 VIEW COMPARISON:  Chest x-ray 09/15/2019. FINDINGS: An endotracheal tube is in place with tip 4.7 cm above the carina. Left IJ Vas-Cath with tip terminating in the distal superior vena cava. Nasogastric tube with tip terminating in the proximal stomach. Incompletely imaged catheter in the left upper quadrant, likely to represent a left-sided ureteral stent. Lung volumes are normal. No consolidative airspace disease. No pleural effusions. No pneumothorax. No pulmonary nodule or mass noted. No evidence of pulmonary edema. Heart size is borderline enlarged. Upper mediastinal contours are  unremarkable. Atherosclerosis in the thoracic aorta. IMPRESSION: 1. Support apparatus, as above. 2. No radiographic evidence of acute cardiopulmonary disease. 3. Aortic atherosclerosis. Electronically Signed   By: Vinnie Langton M.D.   On: 09/17/2019 04:57    Date: 09/15/2019 EKG Time: 14:36 Rate: 74 Rhythm: 2nd degree heart block vs 1st degree block with PAC's Axis: Normal Intervals:none ST&T Change: peaked T waves   ASSESSMENT / PLAN:   Acute Hypoxic  Respiratory Failure secondary to Aspiration Pneumonia? COPD without evidence of acute exacerbation -Wean off sedation as tolerated -Trend WBC and monitor fever curve  -CXR reviewd by me today - no inifltrate noted -Continue Unasyn -As needed bronchodilators -Encourage smoking cessation   Bradycardia - currently on precedex drip S/p transient loss of pulse with ROSC -EKG shows questionable Junctional rhythm/slow afib -Now ff Dopamine gtt - electrolyte replacement -Cardiology input appreciated  Acute Metabolic Encephalopathy - secondary to severe metabolic derangement - Exam improving per previous team -Continue to treat underlying medical issues -CT head 7/3  negative for acute intracranial abnormality  Myoclonic Jerking - concerns of seizure activity? 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    Critical care provider statement:    Critical care time (minutes):  33   Critical care time was exclusive of:  Separately billable procedures and  treating other patients   Critical care was necessary to treat or prevent imminent or  life-threatening deterioration of the following conditions:  encephalopathy due to metabolic derrangements, COPD, emphysema, acute hypoxemic respiratory failure, multiple comorbid conditions.    Critical care was time spent personally by me on the following  activities:  Development of treatment plan with patient or surrogate,  discussions with consultants, evaluation of patient's response to  treatment, examination of patient, obtaining history from patient or  surrogate, ordering and performing treatments and interventions, ordering  and review of laboratory studies and re-evaluation of patient's condition   I assumed direction of critical care for this patient from another  provider in my specialty: no     09/18/2019, 9:48 AM

## 2019-09-18 NOTE — Progress Notes (Signed)
   09/18/19 0955  Clinical Encounter Type  Visited With Patient  Visit Type Follow-up;Spiritual support;Social support  Referral From Chaplain  Consult/Referral To Chaplain  Ch followed-up with Pt per on-call Ch. Pt's family not in the room. Ch will follow-up when family returns.

## 2019-09-18 NOTE — Progress Notes (Signed)
Pt resting in bed quietly at this time with no s/s of distress noted. Precedex gtt has been weaned off. VSS on heated high flow. Daughter is at bedside. Pt is oriented to self and pleasantly confused, follows commands appropriately. Speech therapy evaluated patient today and she now has a diet order.

## 2019-09-18 NOTE — Progress Notes (Signed)
Subjective: Patient more responsive today.    Objective: Current vital signs: BP (!) 142/52 (BP Location: Right Arm)   Pulse 81   Temp 98.2 F (36.8 C) (Bladder)   Resp (!) 22   Ht 5' 6"  (1.676 m)   Wt 69.9 kg   SpO2 91%   BMI 24.87 kg/m  Vital signs in last 24 hours: Temp:  [98.2 F (36.8 C)-102 F (38.9 C)] 98.2 F (36.8 C) (07/05 1200) Pulse Rate:  [52-95] 81 (07/05 1200) Resp:  [13-26] 22 (07/05 1200) BP: (104-187)/(40-80) 142/52 (07/05 1200) SpO2:  [89 %-99 %] 91 % (07/05 1200) FiO2 (%):  [40 %-75 %] 45 % (07/05 1200)  Intake/Output from previous day: 07/04 0701 - 07/05 0700 In: 1001.8 [I.V.:409.6; IV Piggyback:592.2] Out: 1945 [Urine:1945] Intake/Output this shift: Total I/O In: 513.6 [I.V.:90.8; IV Piggyback:422.8] Out: 350 [Urine:350] Nutritional status:  Diet Order            Diet NPO time specified  Diet effective now                 Neurologic Exam: Mental Status: Awakens and opens eyes to light sternal rub.  Responds "ouch" to noxious stimuli.     Cranial Nerves: II: Blinks confrontation bilaterally, pupils reactive bilaterally III,IV,VI: Oculocephalic response present bilaterally.  V,VII: corneal reflex present bilaterally  VIII: patient does not respond to verbal stimuli IX,X: gag reflex reduced, XI: trapezius strength unable to test bilaterally XII: tongue strength unable to test Motor: Trace movement noted in the extremities. Sensory: Responds to noxious stimuli bilaterally   Lab Results: Basic Metabolic Panel: Recent Labs  Lab 09/15/19 1707 09/15/19 1707 09/15/19 1725 09/15/19 2325 09/15/19 2325 09/16/19 0450 09/17/19 0541 09/17/19 0641 09/18/19 0500  NA 136  --   --  135  --  135  --  139 139  K 6.1*  --   --  5.4*  --  5.1  --  4.6 3.7  CL 106  --   --  100  --  99  --  100 99  CO2 21*  --   --  26  --  27  --  28 32  GLUCOSE 167*  --   --  160*  --  141*  --  94 91  BUN 50*  --   --  58*  --  57*  --  45* 32*   CREATININE 2.45*  --   --  2.44*  --  2.14*  --  1.30* 1.13*  CALCIUM 8.4*   < >  --  9.5   < > 9.4  --  9.0 9.1  MG  --   --  1.9  --   --  2.0 1.9  --  1.8  PHOS  --   --  4.3  --   --  5.0*  --  4.4 3.0   < > = values in this interval not displayed.    Liver Function Tests: Recent Labs  Lab 09/15/19 1513 09/17/19 0641 09/18/19 0500  AST 40  --  60*  ALT 40  --  116*  ALKPHOS 61  --  87  BILITOT 0.7  --  1.6*  PROT 6.8  --  5.9*  ALBUMIN 3.7 2.9* 3.0*   Recent Labs  Lab 09/15/19 1725  LIPASE 29  AMYLASE 81   No results for input(s): AMMONIA in the last 168 hours.  CBC: Recent Labs  Lab 09/15/19 1513 09/16/19 0450 09/17/19 0541 09/18/19  0500  WBC 15.0* 16.0* 17.9* 21.0*  NEUTROABS 10.5*  --  14.6*  --   HGB 10.7* 10.0* 8.2* 8.0*  HCT 31.6* 28.4* 23.5* 23.5*  MCV 96.9 92.8 93.6 94.0  PLT 696* 439* 369 349    Cardiac Enzymes: No results for input(s): CKTOTAL, CKMB, CKMBINDEX, TROPONINI in the last 168 hours.  Lipid Panel: Recent Labs  Lab 09/15/19 2325  TRIG 117    CBG: Recent Labs  Lab 09/17/19 2115 09/17/19 2329 09/18/19 0350 09/18/19 0737 09/18/19 1200  GLUCAP 89 87 84 80 70    Microbiology: Results for orders placed or performed during the hospital encounter of 09/15/19  Culture, blood (routine x 2)     Status: None (Preliminary result)   Collection Time: 09/15/19  3:13 PM   Specimen: BLOOD  Result Value Ref Range Status   Specimen Description   Final    BLOOD BLOOD LEFT HAND Performed at [facility], [address], [place], [place] [address]    Special Requests   Final    BOTTLES DRAWN AEROBIC AND ANAEROBIC Blood Culture results may not be optimal due to an excessive volume of blood received in culture bottles Performed at [facility], [address] [place], [place] [address]    Culture  Setup Time   Final    Organism ID to follow [place] TO, READ  BACK BY AND VERIFIED WITH:  SHEEMA HALLAJI ON 09/16/2019 AT 1214 [place]  Final   Report Status PENDING  Incomplete  Blood Culture ID Panel (Reflexed)     Status: Abnormal   Collection Time: 09/15/19  3:13 PM  Result Value Ref Range Status   Enterococcus species NOT DETECTED NOT DETECTED Final   Listeria monocytogenes NOT DETECTED NOT DETECTED Final   Staphylococcus species DETECTED (A) NOT DETECTED Final    Comment: Methicillin (oxacillin) susceptible coagulase negative staphylococcus. Possible blood culture contaminant (unless isolated from more than one blood culture draw or clinical case suggests pathogenicity). No antibiotic treatment is indicated for blood  culture contaminants. CRITICAL RESULT CALLED TO, READ BACK BY AND VERIFIED WITH: SHEEMA HALLAJI ON 09/16/2019 AT 1214 TIK    Staphylococcus aureus (BCID) NOT DETECTED NOT DETECTED Final   Methicillin resistance NOT DETECTED NOT DETECTED Final   Streptococcus species NOT DETECTED NOT DETECTED Final   Streptococcus agalactiae NOT DETECTED NOT DETECTED Final   Streptococcus pneumoniae NOT DETECTED NOT DETECTED Final   Streptococcus pyogenes NOT DETECTED NOT DETECTED Final   Acinetobacter baumannii NOT DETECTED NOT DETECTED Final   Enterobacteriaceae species NOT DETECTED NOT DETECTED Final   Enterobacter cloacae complex NOT DETECTED NOT DETECTED Final   Escherichia coli NOT DETECTED NOT DETECTED Final   Klebsiella oxytoca NOT DETECTED NOT DETECTED Final   Klebsiella pneumoniae NOT DETECTED NOT DETECTED Final   Proteus species NOT DETECTED NOT DETECTED Final   Serratia marcescens NOT DETECTED NOT DETECTED Final   Haemophilus influenzae NOT DETECTED NOT DETECTED Final   Neisseria meningitidis NOT DETECTED NOT DETECTED Final   Pseudomonas aeruginosa NOT DETECTED NOT DETECTED Final   Candida albicans NOT DETECTED NOT DETECTED Final   Candida glabrata NOT DETECTED NOT DETECTED Final   Candida krusei NOT  DETECTED NOT DETECTED Final   Candida parapsilosis NOT DETECTED NOT DETECTED Final   Candida tropicalis NOT DETECTED NOT DETECTED Final    Comment: Performed at [facility], [address], [place],  [address]  Culture, blood (routine x 2)     Status: None (Preliminary result)   Collection Time: 09/15/19  3:50 PM   Specimen: BLOOD  Result Value Ref Range Status   Specimen Description BLOOD LEFT ARM  Final   Special Requests   Final    BOTTLES DRAWN AEROBIC AND ANAEROBIC Blood Culture adequate volume   Culture   Final    NO GROWTH 3 DAYS Performed at [facility], [address] [place], [place] [address]    Report Status PENDING  Incomplete  SARS Coronavirus 2 by RT PCR (hospital order, performed in [facility] hospital lab) Nasopharyngeal Nasopharyngeal Swab     Status: None   Collection Time: 09/15/19  3:55 PM   Specimen: Nasopharyngeal Swab  Result Value Ref Range Status   SARS Coronavirus 2 NEGATIVE NEGATIVE Final    Comment: (NOTE) SARS-CoV-2 target nucleic acids are NOT DETECTED.  The SARS-CoV-2 RNA is generally detectable in upper and lower respiratory specimens during the acute phase of infection. The lowest concentration of SARS-CoV-2 viral copies this assay can detect is 250 copies / mL. A negative result does not preclude SARS-CoV-2 infection and should not be used as the sole basis for treatment or other patient management decisions.  A negative result may occur with improper specimen collection / handling, submission of specimen other than nasopharyngeal swab, presence of viral mutation(s) within the areas targeted by this assay, and inadequate number of viral copies (<250 copies / mL). A negative result must be combined with clinical observations, patient history, and epidemiological information.  Fact Sheet for Patients:   StrictlyIdeas.no  Fact Sheet for Healthcare  Providers: BankingDealers.co.za  This test is not yet approved or  cleared by the [place] FDA and has been authorized for detection and/or diagnosis of SARS-CoV-2 by FDA under an Emergency Use Authorization (EUA).  This EUA will remain in effect (meaning this test can be used) for the duration of the COVID-19 declaration under Section 564(b)(1) of the Act, 21 U.S.C. section 360bbb-3(b)(1), unless the authorization is terminated or revoked sooner.  Performed at [facility], [address] [place], [place] [address]   Urine Culture     Status: None   Collection Time: 09/15/19  6:11 PM   Specimen: Urine, Random  Result Value Ref Range Status   Specimen Description   Final    URINE, RANDOM Performed at [facility], [address] [place], [place] [address]    Special Requests   Final    NONE Performed at [facility], [address] [place], [place] [address]    Culture   Final    NO GROWTH Performed at [facility], [place] [address], [place], [place] [address]    Report Status 09/17/2019 FINAL  Final  MRSA PCR Screening     Status: None   Collection Time: 09/15/19  6:51 PM   Specimen: Nasal Mucosa; Nasopharyngeal  Result Value Ref Range Status   MRSA by PCR NEGATIVE NEGATIVE Final    Comment:        The GeneXpert MRSA Assay (FDA approved for NASAL specimens only), is one component of a comprehensive MRSA colonization surveillance program. It is not intended to diagnose MRSA infection nor to guide or monitor treatment for MRSA infections. Performed at [facility], [address], [place], [place] [address]     Coagulation Studies: Recent Labs    09/15/19 05/16/2010  LABPROT 13.9  INR 1.1    Imaging:  EEG  Result Date: 09/16/2019 Philemon Kingdom, MD     09/16/2019  7:56 PM Date of Study: 09/16/2019  Reason for Study: Pt is a 59 YOF with PMH of HTN, HLD, COPD/ASTHMA, MGUS, ANXIETY/DEPRESSION,  BIPOLAR D/O, hx of gross hematuria and left hydronephrosis, s/p left ureteroscopy 5/27/2,  malignant tumor of the left ureter who had acute hypoxic respiratory failure and AMS. Eval for seizures.  Description of recording: This recording was obtained using a Digital EEG system with 18 channel capacity . Standard bipolar and referential EEG montages were used following the International  10 - 20  System .  This is a digitally recorded EEG with duration of approximately ~21:17 minutes. The background consists of 7-8 hz alpha rhythm which attenuates with eye opening and closure.  There were no epileptiform discharges noted.  There were no recorded electrographic seizures. Hyperventilation was not performed. Photic stimulation was not performed. Sleep did not occur in this recording.   IMPRESSION: This is a normal EEG for age. There were no clinical seizures or epileptiform discharges noted during study. CLINICAL CORRELATION: This EEG finding did not support the diagnosis of seizure.  Clinical correlation is advised. A normal EEG does not rule out epilepsy, which is a clinical diagnosis.  I have personally reviewed this study.  DG Chest Port 1 View  Result Date: 09/17/2019 CLINICAL DATA:  74 year old female with history of respiratory failure. EXAM: PORTABLE CHEST 1 VIEW COMPARISON:  Chest x-ray 09/15/2019. FINDINGS: An endotracheal tube is in place with tip 4.7 cm above the carina. Left IJ Vas-Cath with tip terminating in the distal superior vena cava. Nasogastric tube with tip terminating in the proximal stomach. Incompletely imaged catheter in the left upper quadrant, likely to represent a left-sided ureteral stent. Lung volumes are normal. No consolidative airspace disease. No pleural effusions. No pneumothorax. No pulmonary nodule or mass noted. No evidence of pulmonary edema. Heart size is borderline enlarged. Upper mediastinal contours are unremarkable. Atherosclerosis in the thoracic aorta. IMPRESSION: 1.  Support apparatus, as above. 2. No radiographic evidence of acute cardiopulmonary disease. 3. Aortic atherosclerosis. Electronically Signed   By: Vinnie Langton M.D.   On: 09/17/2019 04:57    Medications:  I have reviewed the patient's current medications. Scheduled: . aspirin  300 mg Rectal Daily  . chlorhexidine gluconate (MEDLINE KIT)  15 mL Mouth Rinse BID  . Chlorhexidine Gluconate Cloth  6 each Topical Daily  . docusate  100 mg Oral BID  . insulin aspart  0-9 Units Subcutaneous Q4H  . ipratropium-albuterol  3 mL Nebulization Q4H  . mouth rinse  15 mL Mouth Rinse 10 times per day  . pantoprazole (PROTONIX) IV  40 mg Intravenous QHS  . polyethylene glycol  17 g Oral Daily    Assessment/Plan: 74 y.o.femalewith PMH significant for COPD, Asthma, GERD, MGUS, Hydronephrosis of left Kidney, HLD, Anxiety and Depression, Thrombocytosis, Bipolar disorder, Thyroid disease, tobacco abuse, bleeding disorder,hx of gross hematuria and left hydronephrosis, s/p left ureteroscopy 5/27/2,Malignant tumor of the left ureter was scheduled for resection at [place] presenting to the ED with c/o acute hypoxic respiratory failure and altered mental status. Pt was intubated in ED with suspected brief cardiac arrest likely due to hypoxia as O2 of 35 % on admission. Pt was found to have subclavian clot and is on heparin. Although remains altered appears to be showing some small signs of improvement on examination.  Head CT shows no acute changes.  EEG normal.  Current presentation likely secondary to a  prolonged metabolic encephalopathy.  With above unremarkable testing will continue to follow clinically for now.  Patient continues to show signs of slow improvement.   LOS: 3 days   Alexis Goodell, MD Neurology [phone] 09/18/2019  1:03 PM

## 2019-09-18 NOTE — Progress Notes (Signed)
[address] [place], [place] [address] Phone [phone]. Fax [phone]  Date: 09/18/2019                  Patient Name:  Pamela James  MRN: [id number]  DOB: Dec 30, 1945  Age / Sex: 74 y.o., female         PCP: Cindra Eves, MD                 Service Requesting Consult: IM/ Ottie Glazier, MD                 Reason for Consult: ARF, hyperkalemia            Hospital course: Patient is a 74 y.o. female with medical problems of COPD, Asthma, GERD, MGUS, hydronephrosis of left kidney, anxiety, depression, who was admitted to [facility] on 09/15/2019 for evaluation of acute hypoxic resp failure, bradycardia.  She was intubated in the ER for airway protection  Patient is now extubated.  She has high flow nasal cannula Lethargic but able to follow simple commands Getting IV heparin, IV vancomycin Some sedation with Precedex   Current medications: Current Facility-Administered Medications  Medication Dose Route Frequency Provider Last Rate Last Admin  . 0.9 %  sodium chloride infusion   Intravenous PRN Bradly Bienenstock, NP   Paused at 09/18/19 0815  . acetaminophen (OFIRMEV) IV 1,000 mg  1,000 mg Intravenous Q6H Tyler Pita, MD   Stopped at 09/18/19 [phone]  . acetaminophen (TYLENOL) 160 MG/5ML solution 650 mg  650 mg Per Tube Q6H PRN Bradly Bienenstock, NP   650 mg at 09/17/19 0558  . acetaminophen (TYLENOL) suppository 650 mg  650 mg Rectal Q4H PRN Tyler Pita, MD   650 mg at 09/17/19 1711  . Ampicillin-Sulbactam (UNASYN) 3 g in sodium chloride 0.9 % 100 mL IVPB  3 g Intravenous Q6H Oswald Hillock, RPH   Stopped at 09/18/19 4627  . aspirin suppository 300 mg  300 mg Rectal Daily Leotis Pain, MD   300 mg at 09/17/19 0925  . chlorhexidine gluconate (MEDLINE KIT) (PERIDEX) 0.12 % solution 15 mL  15 mL Mouth Rinse BID Tyler Pita, MD   15 mL at 09/18/19 0816  . Chlorhexidine Gluconate Cloth 2 % PADS 6 each  6 each Topical Daily Bradly Bienenstock, NP    6 each at 09/17/19 0944  . dexmedetomidine (PRECEDEX) 400 MCG/100ML (4 mcg/mL) infusion  0.4-0.7 mcg/kg/hr Intravenous Titrated Tyler Pita, MD 12.23 mL/hr at 09/18/19 0836 0.7 mcg/kg/hr at 09/18/19 0836  . docusate (COLACE) 50 MG/5ML liquid 100 mg  100 mg Oral BID Darel Hong D, NP   100 mg at 09/17/19 0925  . docusate sodium (COLACE) capsule 100 mg  100 mg Oral BID PRN Tyler Pita, MD      . DOPamine (INTROPIN) 800 mg in dextrose 5 % 250 mL (3.2 mg/mL) infusion  0-20 mcg/kg/min Intravenous Continuous Tyler Pita, MD   Stopped at 09/16/19 1006  . fentaNYL (SUBLIMAZE) injection 25 mcg  25 mcg Intravenous Q2H PRN Tyler Pita, MD      . heparin ADULT infusion 100 units/mL (25000 units/265m sodium chloride 0.45%)  950 Units/hr Intravenous Continuous GTyler Pita MD 9.5 mL/hr at 09/18/19 0836 950 Units/hr at 09/18/19 0836  . hydrALAZINE (APRESOLINE) injection 10 mg  10 mg Intravenous Q4H PRN GTyler Pita MD   10 mg at 09/17/19 1708  . insulin aspart (novoLOG) injection 0-9 Units  0-9  Units Subcutaneous Q4H Darel Hong D, NP   1 Units at 09/16/19 2025  . ipratropium-albuterol (DUONEB) 0.5-2.5 (3) MG/3ML nebulizer solution 3 mL  3 mL Nebulization Q4H Tyler Pita, MD   3 mL at 09/18/19 0746  . MEDLINE mouth rinse  15 mL Mouth Rinse 10 times per day Tyler Pita, MD   15 mL at 09/18/19 0428  . ondansetron (ZOFRAN) injection 4 mg  4 mg Intravenous Q6H PRN Tyler Pita, MD      . pantoprazole (PROTONIX) injection 40 mg  40 mg Intravenous QHS Tyler Pita, MD   40 mg at 09/17/19 2125  . polyethylene glycol (MIRALAX / GLYCOLAX) packet 17 g  17 g Oral Daily PRN Tyler Pita, MD   17 g at 09/16/19 0857  . polyethylene glycol (MIRALAX / GLYCOLAX) packet 17 g  17 g Oral Daily Darel Hong D, NP   17 g at 09/17/19 0926  . [START ON 09/19/2019] vancomycin (VANCOREADY) IVPB 1250 mg/250 mL  1,250 mg Intravenous Q24H Aleskerov, Fuad, MD       . vancomycin (VANCOREADY) IVPB 1500 mg/300 mL  1,500 mg Intravenous Once Tyler Pita, MD 150 mL/hr at 09/18/19 0836 Rate Verify at 09/18/19 0836      Allergies: Allergies  Allergen Reactions  . Aspirin Other (See Comments)    GI ulcers  . Morphine And Related Other (See Comments)    Delerium, itching  . Nsaids Other (See Comments)    GI ulcers     Vital Signs: Blood pressure 128/80, pulse 61, temperature 99 F (37.2 C), temperature source Bladder, resp. rate (!) 22, height _0  (1.676 m), weight 69.9 kg, SpO2 96 %.   Intake/Output Summary (Last 24 hours) at 09/18/2019 0947 Last data filed at 09/18/2019 0836 Gross per 24 hour  Intake 1056.79 ml  Output 1870 ml  Net -813.21 ml    Weight trends: Filed Weights   09/15/19 1440 09/15/19 1850  Weight: 77.1 kg 69.9 kg    Physical Exam: General:  Critically ill-appearing  HEENT  ETT, OGT  Lungs:  High flow nasal cannula, coarse breath sounds  Heart::  Regular rhythm with ectopic beats  Abdomen:  Soft, nontender  Extremities: + Dependent edema in arms and thighs  Neurologic:  Lethargic but able to follow commands  Skin:  No acute rashes  Access:  Left IJ dialysis catheter  Foley:  In place       Lab results: Basic Metabolic Panel: Recent Labs  Lab 09/16/19 0450 09/17/19 0541 09/17/19 0641 09/18/19 0500  NA 135  --  139 139  K 5.1  --  4.6 3.7  CL 99  --  100 99  CO2 27  --  28 32  GLUCOSE 141*  --  94 91  BUN 57*  --  45* 32*  CREATININE 2.14*  --  1.30* 1.13*  CALCIUM 9.4  --  9.0 9.1  MG 2.0 1.9  --  1.8  PHOS 5.0*  --  4.4 3.0    Liver Function Tests: Recent Labs  Lab 09/18/19 0500  AST 60*  ALT 116*  ALKPHOS 87  BILITOT 1.6*  PROT 5.9*  ALBUMIN 3.0*   Recent Labs  Lab 09/15/19 1725  LIPASE 29  AMYLASE 81   No results for input(s): AMMONIA in the last 168 hours.  CBC: Recent Labs  Lab 09/15/19 1513 09/16/19 0450 09/17/19 0541 09/18/19 0500  WBC 15.0*   < > 17.9* 21.0*  NEUTROABS 10.5*  --  14.6*  --   HGB 10.7*   < > 8.2* 8.0*  HCT 31.6*   < > 23.5* 23.5*  MCV 96.9   < > 93.6 94.0  PLT 696*   < > 369 349   < > = values in this interval not displayed.    Cardiac Enzymes: No results for input(s): CKTOTAL, TROPONINI in the last 168 hours.  BNP: Invalid input(s): POCBNP  CBG: Recent Labs  Lab 09/17/19 1610 09/17/19 2115 09/17/19 2329 09/18/19 0350 09/18/19 0737  GLUCAP 97 89 87 84 80    Microbiology: Recent Results (from the past 720 hour(s))  Culture, blood (routine x 2)     Status: None (Preliminary result)   Collection Time: 09/15/19  3:13 PM   Specimen: BLOOD  Result Value Ref Range Status   Specimen Description   Final    BLOOD BLOOD LEFT HAND Performed at [facility], [address], [place], [place] [address]    Special Requests   Final    BOTTLES DRAWN AEROBIC AND ANAEROBIC Blood Culture results may not be optimal due to an excessive volume of blood received in culture bottles Performed at [facility], [address] [place], [place] [address]    Culture  Setup Time   Final    Organism ID to follow [place] TO, READ BACK BY AND VERIFIED WITH:  SHEEMA HALLAJI ON 09/16/2019 AT 1214 [place]  Final   Report Status PENDING  Incomplete  Blood Culture ID Panel (Reflexed)     Status: Abnormal   Collection Time: 09/15/19  3:13 PM  Result Value Ref Range Status   Enterococcus species NOT DETECTED NOT DETECTED Final   Listeria monocytogenes NOT DETECTED NOT DETECTED Final   Staphylococcus species DETECTED (A) NOT DETECTED Final    Comment: Methicillin (oxacillin) susceptible coagulase negative staphylococcus. Possible blood culture contaminant (unless isolated from more than one blood culture draw or clinical case suggests pathogenicity). No antibiotic treatment is indicated for blood  culture contaminants. CRITICAL RESULT  CALLED TO, READ BACK BY AND VERIFIED WITH: SHEEMA HALLAJI ON 09/16/2019 AT 1214 TIK    Staphylococcus aureus (BCID) NOT DETECTED NOT DETECTED Final   Methicillin resistance NOT DETECTED NOT DETECTED Final   Streptococcus species NOT DETECTED NOT DETECTED Final   Streptococcus agalactiae NOT DETECTED NOT DETECTED Final   Streptococcus pneumoniae NOT DETECTED NOT DETECTED Final   Streptococcus pyogenes NOT DETECTED NOT DETECTED Final   Acinetobacter baumannii NOT DETECTED NOT DETECTED Final   Enterobacteriaceae species NOT DETECTED NOT DETECTED Final   Enterobacter cloacae complex NOT DETECTED NOT DETECTED Final   Escherichia coli NOT DETECTED NOT DETECTED Final   Klebsiella oxytoca NOT DETECTED NOT DETECTED Final   Klebsiella pneumoniae NOT DETECTED NOT DETECTED Final   Proteus species NOT DETECTED NOT DETECTED Final   Serratia marcescens NOT DETECTED NOT DETECTED Final   Haemophilus influenzae NOT DETECTED NOT DETECTED Final   Neisseria meningitidis NOT DETECTED NOT DETECTED Final   Pseudomonas aeruginosa NOT DETECTED NOT DETECTED Final   Candida albicans NOT DETECTED NOT DETECTED Final   Candida glabrata NOT DETECTED NOT DETECTED Final   Candida krusei NOT DETECTED NOT DETECTED Final   Candida parapsilosis NOT DETECTED NOT DETECTED Final   Candida tropicalis NOT DETECTED NOT DETECTED Final    Comment: Performed at [facility], [address] [place], [place] [address]  Culture,  blood (routine x 2)     Status: None (Preliminary result)   Collection Time: 09/15/19  3:50 PM   Specimen: BLOOD  Result Value Ref Range Status   Specimen Description BLOOD LEFT ARM  Final   Special Requests   Final    BOTTLES DRAWN AEROBIC AND ANAEROBIC Blood Culture adequate volume   Culture   Final    NO GROWTH 3 DAYS Performed at [facility], [address] [place], [place] [address]    Report Status PENDING  Incomplete  SARS Coronavirus 2 by RT PCR (hospital order, performed  in [place] hospital lab) Nasopharyngeal Nasopharyngeal Swab     Status: None   Collection Time: 09/15/19  3:55 PM   Specimen: Nasopharyngeal Swab  Result Value Ref Range Status   SARS Coronavirus 2 NEGATIVE NEGATIVE Final    Comment: (NOTE) SARS-CoV-2 target nucleic acids are NOT DETECTED.  The SARS-CoV-2 RNA is generally detectable in upper and lower respiratory specimens during the acute phase of infection. The lowest concentration of SARS-CoV-2 viral copies this assay can detect is 250 copies / mL. A negative result does not preclude SARS-CoV-2 infection and should not be used as the sole basis for treatment or other patient management decisions.  A negative result may occur with improper specimen collection / handling, submission of specimen other than nasopharyngeal swab, presence of viral mutation(s) within the areas targeted by this assay, and inadequate number of viral copies (<250 copies / mL). A negative result must be combined with clinical observations, patient history, and epidemiological information.  Fact Sheet for Patients:   StrictlyIdeas.no  Fact Sheet for Healthcare Providers: BankingDealers.co.za  This test is not yet approved or  cleared by the [place] FDA and has been authorized for detection and/or diagnosis of SARS-CoV-2 by FDA under an Emergency Use Authorization (EUA).  This EUA will remain in effect (meaning this test can be used) for the duration of the COVID-19 declaration under Section 564(b)(1) of the Act, 21 U.S.C. section 360bbb-3(b)(1), unless the authorization is terminated or revoked sooner.  Performed at [facility], [address] [place], [place] [address]   Urine Culture     Status: None   Collection Time: 09/15/19  6:11 PM   Specimen: Urine, Random  Result Value Ref Range Status   Specimen Description   Final    URINE, RANDOM Performed at [facility], [address] [place], [place] [address]    Special Requests   Final    NONE Performed at [facility], [address] [place], [place] [address]    Culture   Final    NO GROWTH Performed at [facility], [place] [address], [place], [place] [address]    Report Status 09/17/2019 FINAL  Final  MRSA PCR Screening     Status: None   Collection Time: 09/15/19  6:51 PM   Specimen: Nasal Mucosa; Nasopharyngeal  Result Value Ref Range Status   MRSA by PCR NEGATIVE NEGATIVE Final    Comment:        The GeneXpert MRSA Assay (FDA approved for NASAL specimens only), is one component of a comprehensive MRSA colonization surveillance program. It is not intended to diagnose MRSA infection nor to guide or monitor treatment for MRSA infections. Performed at [facility], [address] [place], [place] [address]      Coagulation Studies: Recent Labs    09/15/19 05-24-2010  LABPROT 13.9  INR 1.1  Urinalysis: Recent Labs    09/15/19 1811  COLORURINE AMBER*  LABSPEC 1.018  PHURINE 5.0  GLUCOSEU NEGATIVE  HGBUR LARGE*  BILIRUBINUR NEGATIVE  KETONESUR 5*  PROTEINUR 100*  NITRITE NEGATIVE  LEUKOCYTESUR TRACE*        Imaging: EEG  Result Date: 09/16/2019 Philemon Kingdom, MD     09/16/2019  7:56 PM Date of Study: 09/16/2019  Reason for Study: Pt is a 43 YOF with PMH of HTN, HLD, COPD/ASTHMA, MGUS, ANXIETY/DEPRESSION, BIPOLAR D/O, hx of gross hematuria and left hydronephrosis, s/p left ureteroscopy 5/27/2,  malignant tumor of the left ureter who had acute hypoxic respiratory failure and AMS. Eval for seizures.  Description of recording: This recording was obtained using a Digital EEG system with 18 channel capacity . Standard bipolar and referential EEG montages were used following the International  10 - 20  System .  This is a digitally recorded EEG with duration of approximately ~21:17 minutes. The background consists of 7-8 hz alpha rhythm which attenuates  with eye opening and closure.  There were no epileptiform discharges noted.  There were no recorded electrographic seizures. Hyperventilation was not performed. Photic stimulation was not performed. Sleep did not occur in this recording.   IMPRESSION: This is a normal EEG for age. There were no clinical seizures or epileptiform discharges noted during study. CLINICAL CORRELATION: This EEG finding did not support the diagnosis of seizure.  Clinical correlation is advised. A normal EEG does not rule out epilepsy, which is a clinical diagnosis.  I have personally reviewed this study.  US RENAL  Result Date: 09/16/2019 CLINICAL DATA:  Acute kidney failure. EXAM: RENAL / URINARY TRACT ULTRASOUND COMPLETE COMPARISON:  None. FINDINGS: Right Kidney: Renal measurements: 9.7 x 3.7 x 4.3 cm = volume: 81 mL. Mild increased cortical echogenicity. No hydronephrosis or focal mass. Small focus of right perinephric fluid. Left Kidney: Renal measurements: 10.0 x 4.8 x 4.9 cm = volume: 123 mL. Echogenicity within normal limits. No mass or hydronephrosis visualized. 1.8 cm lower pole cyst. Bladder: Decompressed with presence of Foley catheter. Other: None. IMPRESSION: 1. Normal size kidneys without hydronephrosis. Mild increased right sided cortical echogenicity which could be seen with medical renal disease. Small amount of adjacent right perinephric fluid. 2.  1.8 cm left renal cyst. Electronically Signed   By: Marin Olp M.D.   On: 09/16/2019 11:53   US Venous Img Upper Uni Left (DVT)  Result Date: 09/16/2019 CLINICAL DATA:  74 year old female with a history of mottled skin EXAM: LEFT UPPER EXTREMITY VENOUS DOPPLER ULTRASOUND TECHNIQUE: Gray-scale sonography with graded compression, as well as color Doppler and duplex ultrasound were performed to evaluate the upper extremity deep venous system from the level of the subclavian vein and including the jugular, axillary, basilic, radial, ulnar and upper cephalic vein.  Spectral Doppler was utilized to evaluate flow at rest and with distal augmentation maneuvers. COMPARISON:  None. FINDINGS: Contralateral Subclavian Vein: Respiratory phasicity is normal and symmetric with the symptomatic side. No evidence of thrombus. Normal compressibility. Internal Jugular Vein: No evidence of thrombus. Normal compressibility, respiratory phasicity and response to augmentation. Subclavian Vein: Partially occlusive thrombus of the left subclavian vein. Axillary Vein: Occlusive thrombus of the axillary vein, noncompressible. Cephalic Vein: Occlusive thrombus of the cephalic vein, noncompressible. Basilic Vein: Occlusive thrombus of the basilic vein, noncompressive. Brachial Veins: Paired Brachial veins, both of which demonstrate occlusive thrombus. Radial Veins: Occlusive thrombus Ulnar Veins: Occlusive thrombus Other Findings:  None visualized. IMPRESSION: Sonographic survey of the  left upper extremity positive for DVT of the subclavian vein, axillary vein, and paired brachial veins. Occlusive of the axillary and paired brachial veins, and nonocclusive at the subclavian vein. Superficial occlusive thrombophlebitis of the cephalic vein, basilic vein, radial vein, and ulnar vein. Electronically Signed   By: Corrie Mckusick D.O.   On: 09/16/2019 12:59   DG Chest Port 1 View  Result Date: 09/17/2019 CLINICAL DATA:  74 year old female with history of respiratory failure. EXAM: PORTABLE CHEST 1 VIEW COMPARISON:  Chest x-ray 09/15/2019. FINDINGS: An endotracheal tube is in place with tip 4.7 cm above the carina. Left IJ Vas-Cath with tip terminating in the distal superior vena cava. Nasogastric tube with tip terminating in the proximal stomach. Incompletely imaged catheter in the left upper quadrant, likely to represent a left-sided ureteral stent. Lung volumes are normal. No consolidative airspace disease. No pleural effusions. No pneumothorax. No pulmonary nodule or mass noted. No evidence of  pulmonary edema. Heart size is borderline enlarged. Upper mediastinal contours are unremarkable. Atherosclerosis in the thoracic aorta. IMPRESSION: 1. Support apparatus, as above. 2. No radiographic evidence of acute cardiopulmonary disease. 3. Aortic atherosclerosis. Electronically Signed   By: Vinnie Langton M.D.   On: 09/17/2019 04:57     Assessment & Plan: Pt is a 74 y.o. African-American   female with  Anxiety, depression, History of suicidal attempt in the past Asthma, COPD GERD Hypertension History of total left hip arthroplasty  Hysterectomy History of adrenal adenoma MGUS History of Covid vaccination [facility] April 1921 Current smoker about 5 cigarettes/day  Recent history of gross hematuria, left hydronephrosis, status post left ureteroscopy on Aug 10, 2019 noted to have distal left ureteral tumor with inconclusive biopsy results.  September 12, 2019: Cystourethroscopy, ureteroscopy and resection of ureteral tumor by Dr. Kandee Keen at [facility].  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  Ureteral stent placed in the left collecting system under cystoscopy guidance  was admitted on 09/15/2019 with Hyperkalemia [E87.5] Bradycardia [R00.1] Respiratory distress [R06.03] Acute on chronic respiratory failure with hypoxia (HCC) [J96.21] Aspiration pneumonia of left lung, unspecified aspiration pneumonia type, unspecified part of lung ([employer]) [J69.0]  #Acute kidney injury Baseline creatinine of 1.6 from Aug 07, 2019 AKI likely multifactorial but likely due to hypotension from concurrent cardiac events Patient is currently nonoliguric and serum creatinine has improved significantly   Lab Results  Component Value Date   CREATININE 1.13 (H) 09/18/2019   CREATININE 1.30 (H) 09/17/2019   CREATININE 2.14 (H) 09/16/2019  Continue supportive care  # Hyperkalemia Likely due to ACE inhibitor in the setting of acute kidney injury Patient  is now nonoliguric Potassium has improved with shifting measures Lisinopril can be restarted  #Acute respiratory failure Extubated now.   High flow nasal cannula  #Left hydronephrosis, urothelial tumor Recent stent placement at [facility] on June 29 F/u renal u/s shows normal-sized kidneys without hydronephrosis. Mildly increased right-sided cortical echogenicity small amount of right perinephric fluid.  1.8 cm left renal cyst.  Will sign off Please reconsult as necessary   LOS: 3 Dashanna Kinnamon Candiss Norse 7/5/20219:47 AM    Note: This note was prepared with Dragon dictation. Any transcription errors are unintentional

## 2019-09-18 NOTE — Consult Note (Signed)
ANTICOAGULATION CONSULT NOTE - Initial Consult  Pharmacy Consult for Heparin infusion  Indication: Subclavian clot  Allergies  Allergen Reactions  . Aspirin Other (See Comments)    GI ulcers  . Morphine And Related Other (See Comments)    Delerium, itching  . Nsaids Other (See Comments)    GI ulcers    Patient Measurements: Height: 5\' 6"  (167.6 cm) Weight: 69.9 kg (154 lb 1.6 oz) IBW/kg (Calculated) : 59.3  Vital Signs: Temp: 101.3 F (38.5 C) (07/04 2300) Temp Source: Bladder (07/04 2300) BP: 140/69 (07/04 2300) Pulse Rate: 90 (07/04 2300)  Labs: Recent Labs    09/15/19 1513 09/15/19 1513 09/15/19 1707 09/15/19 1725 09/15/19 2012 09/15/19 2325 09/16/19 0450 09/16/19 1851 09/16/19 2023 09/17/19 0541 09/17/19 0641 09/17/19 1456 09/17/19 2142  HGB 10.7*   < >  --   --   --   --  10.0*  --   --  8.2*  --   --   --   HCT 31.6*  --   --   --   --   --  28.4*  --   --  23.5*  --   --   --   PLT 696*  --   --   --   --   --  439*  --   --  369  --   --   --   APTT  --   --   --   --  31  --   --   --   --   --   --   --   --   LABPROT  --   --   --   --  13.9  --   --   --   --   --   --   --   --   INR  --   --   --   --  1.1  --   --   --   --   --   --   --   --   HEPARINUNFRC  --   --   --   --   --   --   --    < > 1.19*  --   --  0.27* 0.20*  CREATININE 2.64*  --    < >  --   --  2.44* 2.14*  --   --   --  1.30*  --   --   TROPONINIHS 94*  --   --  118*  --   --   --   --   --   --   --   --   --    < > = values in this interval not displayed.    Estimated Creatinine Clearance: 35 mL/min (A) (by C-G formula based on SCr of 1.3 mg/dL (H)).   Medical History: Past Medical History:  Diagnosis Date  . Anxiety   . Asthma   . COPD (chronic obstructive pulmonary disease) ([place])   . Depression   . GERD (gastroesophageal reflux disease)   . Hypertension    Assessment: Pharmacy consulted for heparin infusion dosing and monitoring for 74 yo female for a  subclavian clot. Baseline labs ordered.   Scr: 2.44>> 2.14    CrCl <29ml/min   APTT: 31, INR: 1.1   Hgb: 10   Plts: 439   07/4 HL supratherapeutic @ 1.19. Heparin rate reduced to 700 units/hr.    Goal  of Therapy:  Heparin level 0.3-0.7 units/ml Monitor platelets by anticoagulation protocol: Yes   Plan:  07/04 @ 2200 HL 0.20 subtherapeutic. Will increase rate to 950 units/hr and will recheck HL at 0800, CBC trending down will continue to monitor.  Tobie Lords, PharmD Clinical Pharmacist 09/18/2019 12:14 AM

## 2019-09-18 NOTE — Consult Note (Signed)
ANTICOAGULATION CONSULT NOTE  Pharmacy Consult for Heparin infusion  Indication: Subclavian clot   Patient Measurements: Height: 5\' 6"  (167.6 cm) Weight: 69.9 kg (154 lb 1.6 oz) IBW/kg (Calculated) : 59.3  Vital Signs: Temp: 99.1 F (37.3 C) (07/05 0600) Temp Source: Bladder (07/05 0400) BP: 139/57 (07/05 0600) Pulse Rate: 73 (07/05 0600)  Labs: Recent Labs     0000 09/15/19 1513 09/15/19 1707 09/15/19 1725 09/15/19 2012 09/15/19 2325 09/16/19 0450 09/16/19 0450 09/16/19 1851 09/16/19 2023 09/17/19 0541 09/17/19 0641 09/17/19 1456 09/17/19 2142 09/18/19 0500  HGB   < > 10.7*  --   --   --   --  10.0*   < >  --   --  8.2*  --   --   --  8.0*  HCT   < > 31.6*  --   --   --   --  28.4*  --   --   --  23.5*  --   --   --  23.5*  PLT   < > 696*  --   --   --   --  439*  --   --   --  369  --   --   --  349  APTT  --   --   --   --  31  --   --   --   --   --   --   --   --   --   --   LABPROT  --   --   --   --  13.9  --   --   --   --   --   --   --   --   --   --   INR  --   --   --   --  1.1  --   --   --   --   --   --   --   --   --   --   HEPARINUNFRC  --   --   --   --   --   --   --   --    < > 1.19*  --   --  0.27* 0.20*  --   CREATININE  --  2.64*   < >  --   --    < > 2.14*  --   --   --   --  1.30*  --   --  1.13*  TROPONINIHS  --  94*  --  118*  --   --   --   --   --   --   --   --   --   --   --    < > = values in this interval not displayed.    Estimated Creatinine Clearance: 40.3 mL/min (A) (by C-G formula based on SCr of 1.13 mg/dL (H)).   Medical History: Past Medical History:  Diagnosis Date  . Anxiety   . Asthma   . COPD (chronic obstructive pulmonary disease) ([place])   . Depression   . GERD (gastroesophageal reflux disease)   . Hypertension    Assessment: Pharmacy consulted for heparin infusion dosing and monitoring for 74 yo female with a subclavian clot. She is on no chronic anticoagulation PTA. H&H, platelets trending down   Heparin  Course: 7/3 initiation: 4000 unit bolus then 1150 units/hr 7/3 2023 HL 1.19: dec to 700 units/hr 7/4  0641 HL 0.27: inc to 800 units/hr 7/4 2142 HL 0.20: inc to 950 units/hr 7/5 0913 HL 0.33: no change  Goal of Therapy:  Heparin level 0.3-0.7 units/ml Monitor platelets by anticoagulation protocol: Yes   Plan:   Heparin level therapeutic: continue heparin at  950 units/hr  This is the first therapeutic heparin level  recheck heparin level at 1600  CBC in am  Dallie Piles, PharmD Clinical Pharmacist 09/18/2019 6:59 AM

## 2019-09-18 NOTE — Consult Note (Signed)
ANTICOAGULATION CONSULT NOTE  Pharmacy Consult for Heparin infusion  Indication: Subclavian clot   Patient Measurements: Height: 5\' 6"  (167.6 cm) Weight: 69.9 kg (154 lb 1.6 oz) IBW/kg (Calculated) : 59.3  Vital Signs: Temp: 99.5 F (37.5 C) (07/05 1600) Temp Source: Bladder (07/05 1600) BP: 122/67 (07/05 1600) Pulse Rate: 72 (07/05 1600)  Labs: Recent Labs    09/15/19 1725 09/15/19 2012 09/15/19 2325 09/16/19 0450 09/16/19 1851 09/17/19 0541 09/17/19 0641 09/17/19 1456 09/17/19 2142 09/18/19 0500 09/18/19 0913 09/18/19 1601  HGB  --   --    < > 10.0*  --  8.2*  --   --   --  8.0*  --   --   HCT  --   --   --  28.4*  --  23.5*  --   --   --  23.5*  --   --   PLT  --   --   --  439*  --  369  --   --   --  349  --   --   APTT  --  31  --   --   --   --   --   --   --   --   --   --   LABPROT  --  13.9  --   --   --   --   --   --   --   --   --   --   INR  --  1.1  --   --   --   --   --   --   --   --   --   --   HEPARINUNFRC  --   --   --   --    < >  --   --    < > 0.20*  --  0.33 0.30  CREATININE  --   --    < > 2.14*  --   --  1.30*  --   --  1.13*  --   --   TROPONINIHS 118*  --   --   --   --   --   --   --   --   --   --   --    < > = values in this interval not displayed.    Estimated Creatinine Clearance: 40.3 mL/min (A) (by C-G formula based on SCr of 1.13 mg/dL (H)).   Medical History: Past Medical History:  Diagnosis Date  . Anxiety   . Asthma   . COPD (chronic obstructive pulmonary disease) ([place])   . Depression   . GERD (gastroesophageal reflux disease)   . Hypertension    Assessment: Pharmacy consulted for heparin infusion dosing and monitoring for 74 yo female with a subclavian clot. She is on no chronic anticoagulation PTA. H&H, platelets trending down   Heparin Course: 7/3 initiation: 4000 unit bolus then 1150 units/hr 7/3 2023 HL 1.19: dec to 700 units/hr 7/4 0641 HL 0.27: inc to 800 units/hr 7/4 2142 HL 0.20: inc to 950 units/hr 7/5  0913 HL 0.33: no change 7/5 1601 HL 0.30: no change  Goal of Therapy:  Heparin level 0.3-0.7 units/ml Monitor platelets by anticoagulation protocol: Yes   Plan:   Heparin level therapeutic: continue heparin at  950 units/hr  This is the first therapeutic heparin level  CBC/HL in am  Lu Duffel, PharmD, BCPS Clinical Pharmacist 09/18/2019 5:02 PM

## 2019-09-19 LAB — CULTURE, BLOOD (ROUTINE X 2)

## 2019-09-19 LAB — CBC
HCT: 25.1 % — ABNORMAL LOW (ref 36.0–46.0)
Hemoglobin: 8.9 g/dL — ABNORMAL LOW (ref 12.0–15.0)
MCH: 31.8 pg (ref 26.0–34.0)
MCHC: 35.5 g/dL (ref 30.0–36.0)
MCV: 89.6 fL (ref 80.0–100.0)
Platelets: 398 10*3/uL (ref 150–400)
RBC: 2.8 MIL/uL — ABNORMAL LOW (ref 3.87–5.11)
RDW: 15.6 % — ABNORMAL HIGH (ref 11.5–15.5)
WBC: 18.6 10*3/uL — ABNORMAL HIGH (ref 4.0–10.5)
nRBC: 0 % (ref 0.0–0.2)

## 2019-09-19 LAB — RENAL FUNCTION PANEL
Albumin: 2.9 g/dL — ABNORMAL LOW (ref 3.5–5.0)
Anion gap: 11 (ref 5–15)
BUN: 21 mg/dL (ref 8–23)
CO2: 27 mmol/L (ref 22–32)
Calcium: 9.1 mg/dL (ref 8.9–10.3)
Chloride: 103 mmol/L (ref 98–111)
Creatinine, Ser: 0.95 mg/dL (ref 0.44–1.00)
GFR calc Af Amer: >60 mL/min (ref >60)
GFR calc non Af Amer: 59 mL/min — ABNORMAL LOW (ref >60)
Glucose, Bld: 90 mg/dL (ref 70–99)
Phosphorus: 2.2 mg/dL — ABNORMAL LOW (ref 2.5–4.6)
Potassium: 3.4 mmol/L — ABNORMAL LOW (ref 3.5–5.1)
Sodium: 141 mmol/L (ref 135–145)

## 2019-09-19 LAB — GLUCOSE, CAPILLARY
Glucose-Capillary: 104 mg/dL — ABNORMAL HIGH (ref 70–99)
Glucose-Capillary: 104 mg/dL — ABNORMAL HIGH (ref 70–99)
Glucose-Capillary: 80 mg/dL (ref 70–99)
Glucose-Capillary: 84 mg/dL (ref 70–99)
Glucose-Capillary: 86 mg/dL (ref 70–99)
Glucose-Capillary: 88 mg/dL (ref 70–99)
Glucose-Capillary: 98 mg/dL (ref 70–99)

## 2019-09-19 LAB — HEPARIN LEVEL (UNFRACTIONATED)
Heparin Unfractionated: 0.18 IU/mL — ABNORMAL LOW (ref 0.30–0.70)
Heparin Unfractionated: 0.39 IU/mL (ref 0.30–0.70)

## 2019-09-19 LAB — MAGNESIUM: Magnesium: 2 mg/dL (ref 1.7–2.4)

## 2019-09-19 MED ORDER — K PHOS MONO-SOD PHOS DI & MONO 155-852-130 MG PO TABS
500.0000 mg | ORAL_TABLET | Freq: Two times a day (BID) | ORAL | Status: AC
  Administered 2019-09-19 (×2): 500 mg via ORAL
  Filled 2019-09-19 (×2): qty 2

## 2019-09-19 MED ORDER — PANTOPRAZOLE SODIUM 40 MG PO TBEC
40.0000 mg | DELAYED_RELEASE_TABLET | Freq: Every day | ORAL | Status: DC
  Administered 2019-09-19 – 2019-09-27 (×9): 40 mg via ORAL
  Filled 2019-09-19 (×9): qty 1

## 2019-09-19 MED ORDER — HEPARIN BOLUS VIA INFUSION
1500.0000 [IU] | Freq: Once | INTRAVENOUS | Status: AC
  Administered 2019-09-19: 1500 [IU] via INTRAVENOUS
  Filled 2019-09-19: qty 1500

## 2019-09-19 MED ORDER — ASPIRIN 325 MG PO TABS
325.0000 mg | ORAL_TABLET | Freq: Every day | ORAL | Status: DC
  Administered 2019-09-19 – 2019-09-20 (×2): 325 mg via ORAL
  Filled 2019-09-19 (×2): qty 1

## 2019-09-19 MED ORDER — ACETAMINOPHEN 325 MG PO TABS
650.0000 mg | ORAL_TABLET | Freq: Four times a day (QID) | ORAL | Status: DC | PRN
  Administered 2019-09-19 – 2019-09-27 (×5): 650 mg via ORAL
  Filled 2019-09-19 (×5): qty 2

## 2019-09-19 MED ORDER — POTASSIUM CHLORIDE 10 MEQ/100ML IV SOLN
10.0000 meq | INTRAVENOUS | Status: AC
  Administered 2019-09-19 (×4): 10 meq via INTRAVENOUS
  Filled 2019-09-19 (×4): qty 100

## 2019-09-19 MED ORDER — FUROSEMIDE 10 MG/ML IJ SOLN
40.0000 mg | Freq: Two times a day (BID) | INTRAMUSCULAR | Status: DC
  Administered 2019-09-19 – 2019-09-20 (×3): 40 mg via INTRAVENOUS
  Filled 2019-09-19 (×3): qty 4

## 2019-09-19 MED ORDER — MAGNESIUM SULFATE 2 GM/50ML IV SOLN
2.0000 g | Freq: Once | INTRAVENOUS | Status: AC
  Administered 2019-09-19: 2 g via INTRAVENOUS
  Filled 2019-09-19: qty 50

## 2019-09-19 MED ORDER — FUROSEMIDE 10 MG/ML IJ SOLN
40.0000 mg | Freq: Once | INTRAMUSCULAR | Status: AC
  Administered 2019-09-19: 40 mg via INTRAVENOUS
  Filled 2019-09-19: qty 4

## 2019-09-19 MED ORDER — POTASSIUM CHLORIDE CRYS ER 20 MEQ PO TBCR
40.0000 meq | EXTENDED_RELEASE_TABLET | Freq: Once | ORAL | Status: AC
  Administered 2019-09-19: 40 meq via ORAL
  Filled 2019-09-19: qty 2

## 2019-09-19 NOTE — Consult Note (Signed)
ANTICOAGULATION CONSULT NOTE  Pharmacy Consult for Heparin infusion  Indication: Subclavian clot   Patient Measurements: Height: 5\' 6"  (167.6 cm) Weight: 67.3 kg (148 lb 5.9 oz) IBW/kg (Calculated) : 59.3  Vital Signs: Temp: 101.1 F (38.4 C) (07/06 0600) Temp Source: Bladder (07/06 0400) BP: 179/69 (07/06 0618) Pulse Rate: 77 (07/06 0600)  Labs: Recent Labs     0000 09/17/19 0541 09/17/19 0641 09/17/19 1456 09/17/19 2142 09/18/19 0500 09/18/19 0913 09/18/19 1601 09/19/19 0523  HGB   < > 8.2*  --   --   --  8.0*  --   --  8.9*  HCT  --  23.5*  --   --   --  23.5*  --   --  25.1*  PLT  --  369  --   --   --  349  --   --  398  HEPARINUNFRC  --   --   --    < >   < >  --  0.33 0.30 0.18*  CREATININE  --   --  1.30*  --   --  1.13*  --   --  0.95   < > = values in this interval not displayed.    Estimated Creatinine Clearance: 47.9 mL/min (by C-G formula based on SCr of 0.95 mg/dL).   Medical History: Past Medical History:  Diagnosis Date  . Anxiety   . Asthma   . COPD (chronic obstructive pulmonary disease) ([place])   . Depression   . GERD (gastroesophageal reflux disease)   . Hypertension    Assessment: Pharmacy consulted for heparin infusion dosing and monitoring for 74 yo female with a subclavian clot. She is on no chronic anticoagulation PTA. H&H, platelets trending down   Heparin Course: 7/3 initiation: 4000 unit bolus then 1150 units/hr 7/3 2023 HL 1.19: dec to 700 units/hr 7/4 0641 HL 0.27: inc to 800 units/hr 7/4 2142 HL 0.20: inc to 950 units/hr 7/5 0913 HL 0.33: no change 7/5 1601 HL 0.30: no change 7/6 0523 HL 0.18: subtherapeutic, RN confirms no issues w/ infusion  Goal of Therapy:  Heparin level 0.3-0.7 units/ml Monitor platelets by anticoagulation protocol: Yes   Plan:   Heparin level subtherapeutic: Will give Heparin bolus 1500 units x 1 and increase infusion to 1150 units/hr.  Will recheck HL in 8 hours.  CBC stable  Ena Dawley,  PharmD Clinical Pharmacist 09/19/2019 6:54 AM

## 2019-09-19 NOTE — Consult Note (Signed)
PHARMACY CONSULT NOTE - FOLLOW UP  Pharmacy Consult for Electrolyte Monitoring and Replacement   Recent Labs: Potassium (mmol/L)  Date Value  09/19/2019 3.4 (L)   Magnesium (mg/dL)  Date Value  09/19/2019 2.0   Calcium (mg/dL)  Date Value  09/19/2019 9.1   Albumin (g/dL)  Date Value  09/19/2019 2.9 (L)   Phosphorus (mg/dL)  Date Value  09/19/2019 2.2 (L)   Sodium (mmol/L)  Date Value  09/19/2019 141    Assessment: 74 y.o.femalewith PMH significant for COPD, Asthma, GERD, MGUS, Hydronephrosis of left Kidney, HLD, Anxiety and Depression, Thrombocytosis, Bipolar disorder, Thyroid disease, tobacco abuse, bleeding disorder,hx of gross hematuria and left hydronephrosis, s/p left ureteroscopy 5/27/2  Goal of Therapy:  Electrolytes WNL  Plan:   KCl 40 mEq oral x 1  K-Phos neutral 500 mg x 2  Re-check electrolytes in am  Dallie Piles ,PharmD Clinical Pharmacist 09/19/2019 8:00 AM

## 2019-09-19 NOTE — Consult Note (Signed)
ANTICOAGULATION CONSULT NOTE  Pharmacy Consult for Heparin infusion  Indication: Subclavian clot   Patient Measurements: Height: 5\' 6"  (167.6 cm) Weight: 67.3 kg (148 lb 5.9 oz) IBW/kg (Calculated) : 59.3  Vital Signs: Temp: 100.9 F (38.3 C) (07/06 0700) Temp Source: Bladder (07/06 0400) BP: 185/44 (07/06 0700) Pulse Rate: 84 (07/06 0700)  Labs: Recent Labs     0000 09/17/19 0541 09/17/19 0641 09/17/19 1456 09/17/19 2142 09/18/19 0500 09/18/19 0913 09/18/19 1601 09/19/19 0523  HGB   < > 8.2*  --   --   --  8.0*  --   --  8.9*  HCT  --  23.5*  --   --   --  23.5*  --   --  25.1*  PLT  --  369  --   --   --  349  --   --  398  HEPARINUNFRC  --   --   --    < >   < >  --  0.33 0.30 0.18*  CREATININE  --   --  1.30*  --   --  1.13*  --   --  0.95   < > = values in this interval not displayed.    Estimated Creatinine Clearance: 47.9 mL/min (by C-G formula based on SCr of 0.95 mg/dL).   Medical History: Past Medical History:  Diagnosis Date  . Anxiety   . Asthma   . COPD (chronic obstructive pulmonary disease) ([place])   . Depression   . GERD (gastroesophageal reflux disease)   . Hypertension    Assessment: Pharmacy consulted for heparin infusion dosing and monitoring for 74 yo female with a subclavian clot. She is on no chronic anticoagulation PTA. H&H, platelets have been trending down but appear to have stabilized here.   Heparin Course: 7/3 initiation: 4000 unit bolus then 1150 units/hr 7/3 2023 HL 1.19: dec to 700 units/hr 7/4 0641 HL 0.27: inc to 800 units/hr 7/4 2142 HL 0.20: inc to 950 units/hr 7/5 0913 HL 0.33: no change 7/5 1601 HL 0.30: no change 7/6 0523 HL 0.18: inc to 1150 units/hr 7/6 1508 HL 0.39: no change  Goal of Therapy:  Heparin level 0.3-0.7 units/ml Monitor platelets by anticoagulation protocol: Yes   Plan:   Heparin level therapeutic: continue infusion at 1150 units/hr   recheck heparin level in 8 hours.  CBC in am  Dallie Piles, PharmD Clinical Pharmacist 09/19/2019 7:42 AM

## 2019-09-19 NOTE — Progress Notes (Signed)
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  SIGNIFICANT EVENTS  7/2-Presented to ED 7/2-Transient loss of pulse and hypoxia on the vent requiring manual bag ventilation and CPR with ROSC 7/2-Started on Dopamine Gtt 7/3-Remains intubated and sedated 7/3-Seizure like activity noted overnight started on Keppra 7/4-Remain intubated and mechanically ventillated 09/18/19- no overnight events, weaning precedex drip, optimizing for downgrade from ICU to [facility] 09/19/19- removal HD cath, removal foley, d/cd precedex , optimizing for TRH.   STUDIES:  7/2- CXR>>interval development of diffuse left lung opacity concerning for atelectasis or pneumonia with some degree of volume loss causing mild right to left mediastinal shift 7/2- CT head>> No acute intracranial abnormality 7/3- US Renal>> Pending 7/3-US Venous LUE>> 7/3- EEG>>Shows no evidence of seizure activity 7/4-Chest xray show no evidence of acute cardiopulmonary disease  CULTURES: SARS-CoV-2 PCR 7/2>> Negative MRSA PCR 7/2>>Negative Blood culture 7/2>BCx 1 of 4 GPC. BCID: staph species.  Urine culture 7/2>No growth Strep Pneumo Urinary antigen 7/2>> Negative Legionella Pneumo Ur Ag 7/2> Pending  ANTIBIOTICS: Unasyn  7/2>>  PAST MEDICAL HISTORY :   has a past medical history of Anxiety, Asthma, COPD (chronic obstructive pulmonary disease) ([place]), Depression, GERD (gastroesophageal reflux disease), and Hypertension.  has a past surgical history that includes Tubal ligation; Total hip arthroplasty (Left); and Abdominal hysterectomy.   Prior to Admission medications   Medication Sig Start Date End Date Taking? Authorizing Provider  acetaminophen (TYLENOL) 500 MG tablet Take 500-1,000 mg by mouth every 6 (six) hours as needed for moderate pain or fever.     [provider]  albuterol (ACCUNEB) 1.25 MG/3ML nebulizer solution Take 1 ampule by nebulization every 6 (six) hours as needed for wheezing or shortness of breath.    [provider]  albuterol (PROVENTIL HFA;VENTOLIN HFA) 108 (90 Base) MCG/ACT inhaler Inhale 2 puffs into the lungs every 6 (six) hours as needed for wheezing or shortness of breath.    [provider]  amLODipine (NORVASC) 10 MG tablet Take 10 mg by mouth daily.    [provider]  aspirin EC 81 MG EC tablet Take 1 tablet (81 mg total) by mouth daily. 03/27/16   Hillary Bow, MD  carvedilol (COREG) 12.5 MG tablet Take 1 tablet (12.5 mg total) by mouth 2 (two) times daily with a meal. 11/23/17   Mody, Sital, MD  Fluticasone-Salmeterol (ADVAIR) 250-50 MCG/DOSE AEPB Inhale 1 puff into the lungs every 12 (twelve) hours.     [provider]  ipratropium (ATROVENT HFA) 17 MCG/ACT inhaler Inhale 2 puffs into the lungs 3 (three) times daily.     [provider]  lisinopril (PRINIVIL,ZESTRIL) 40 MG tablet Take 40 mg by mouth daily.    [provider]  nicotine (NICODERM CQ - DOSED IN MG/24 HOURS) 21 mg/24hr patch Place 1 patch (21 mg total) onto the skin daily. 11/23/17   Bettey Costa, MD  omeprazole (PRILOSEC) 20 MG capsule Take 20 mg by mouth daily.    [provider]  pravastatin (PRAVACHOL) 20 MG tablet Take 20 mg by mouth every  evening.     [provider]  ranitidine (ZANTAC) 150 MG capsule Take 150 mg by mouth 2 (two) times daily.    [provider]   Scheduled Meds: . aspirin  325 mg Oral Daily  . Chlorhexidine Gluconate Cloth  6 each Topical Daily  . furosemide  40 mg Intravenous Q12H  . insulin aspart  0-9 Units Subcutaneous Q4H  . ipratropium-albuterol  3 mL Nebulization Q4H  . pantoprazole  40 mg Oral Daily  . phosphorus  500 mg Oral BID  . polyethylene glycol  17 g Oral Daily   Continuous Infusions: . sodium chloride Stopped (09/18/19 0815)  . ampicillin-sulbactam (UNASYN) IV Stopped (09/19/19 7322)  . heparin 1,150 Units/hr (09/19/19 1129)  . potassium chloride 10 mEq (09/19/19 1147)   PRN Meds:.sodium chloride, acetaminophen, acetaminophen, hydrALAZINE, ondansetron (ZOFRAN) IV, polyethylene glycol   Allergies  Allergen Reactions  . Aspirin Other (See Comments)    GI ulcers  . Morphine And Related Other (See Comments)    Delerium, itching  . Nsaids Other (See Comments)    GI ulcers    FAMILY HISTORY:  family history includes Heart disease in her father and sister. SOCIAL HISTORY:  reports that she has been smoking. She has never used smokeless tobacco. She reports that she does not drink alcohol and does not use drugs.  REVIEW OF SYSTEMS:   UNABLE TO ASSESS DUE TO INTUBATION AND SEDATED  VITAL SIGNS: Temp:  [98.8 F (37.1 C)-102.2 F (39 C)] 99.7 F (37.6 C) (07/06 1200) Pulse Rate:  [45-101] 86 (07/06 1200) Resp:  [17-30] 20 (07/06 1200) BP: (122-212)/(44-117) 161/56 (07/06 1200) SpO2:  [85 %-96 %] 95 % (07/06 1200) FiO2 (%):  [43 %-49 %] 49 % (07/06 1200) Weight:  [67.3 kg] 67.3 kg (07/06 0400)  PHYSICAL EXAMINATION: GENERAL: 74 year old patient lying in the bed on intubated and mechanically ventillated EYES:  No scleral icterus. Extraocular muscles intact.  HEENT: Head atraumatic, normocephalic. Oropharynx and nasopharynx clear.  NECK:  Supple, no jugular  venous distention. No thyroid enlargement, no tenderness.  LUNGS: Decreased breath sounds bilaterally, no wheezing, rales,rhonchi or crepitation. No use of accessory muscles of respiration.  CARDIOVASCULAR: S1, S2 normal. No murmurs, rubs, or gallops.  ABDOMEN: Soft, nontender, nondistended. Bowel sounds present. No organomegaly or mass.  EXTREMITIES: Generalized dependent edema, cyanosis, or clubbing.   NEUROLOGIC: confusion is iimproved  PSYCHIATRIC: Unable to assess.  SKIN: No obvious rash, lesion, or ulcer.   Recent Labs  Lab 09/17/19 0641 09/18/19 0500 09/19/19 0523  NA 139 139 141  K 4.6 3.7 3.4*  CL 100 99 103  CO2 28 32 27  BUN 45* 32* 21  CREATININE 1.30* 1.13* 0.95  GLUCOSE 94 91 90   Recent Labs  Lab 09/17/19 0541 09/18/19 0500 09/19/19 0523  HGB 8.2* 8.0* 8.9*  HCT 23.5* 23.5* 25.1*  WBC 17.9* 21.0* 18.6*  PLT 369 349 398   No results found.  Date: 09/15/2019 EKG Time: 14:36 Rate: 74 Rhythm: 2nd degree heart block vs 1st degree block with PAC's Axis: Normal Intervals:none ST&T Change: peaked T waves   ASSESSMENT / PLAN:   Acute Hypoxic  Respiratory Failure secondary to Aspiration Pneumonia? COPD without evidence of acute exacerbation -Wean off sedation as tolerated -Trend WBC and monitor fever curve  -CXR reviewd by me  today - no inifltrate noted -Continue Unasyn -As needed bronchodilators -Encourage smoking cessation   Bradycardia - currently on precedex drip S/p transient loss of pulse with ROSC -EKG shows questionable Junctional rhythm/slow afib -Now ff Dopamine gtt - electrolyte replacement -Cardiology input appreciated  Acute Metabolic Encephalopathy - secondary to severe metabolic derangement - Exam improving per previous team -Continue to treat underlying medical issues -CT head 7/3  negative for acute intracranial abnormality  Myoclonic Jerking - concerns of seizure activity? 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    Critical care provider statement:    Critical care time (minutes):  33   Critical care time was exclusive of:  Separately billable procedures and  treating other patients   Critical care was necessary to treat or prevent imminent or  life-threatening deterioration of the following conditions:  encephalopathy due to metabolic derrangements, COPD, emphysema, acute hypoxemic respiratory failure, multiple comorbid conditions.    Critical care was time spent personally by me on the following  activities:  Development of treatment plan with patient or surrogate,  discussions with consultants, evaluation of patient's response to  treatment, examination of patient, obtaining history from patient or  surrogate, ordering and performing treatments and interventions, ordering  and review of laboratory studies and re-evaluation of patient's condition   I assumed direction of critical care for this patient from another  provider in my specialty: no     09/19/2019, 12:24 PM

## 2019-09-19 NOTE — Progress Notes (Signed)
Subjective: Patient improved and extubated.    Objective: Current vital signs: BP (!) 161/56 (BP Location: Right Arm)   Pulse 86   Temp 99.7 F (37.6 C) (Oral)   Resp 20   Ht 5\' 6"  (1.676 m)   Wt 67.3 kg   SpO2 95%   BMI 23.95 kg/m  Vital signs in last 24 hours: Temp:  [98.8 F (37.1 C)-102.2 F (39 C)] 99.7 F (37.6 C) (07/06 1200) Pulse Rate:  [45-101] 86 (07/06 1200) Resp:  [17-30] 20 (07/06 1200) BP: (122-212)/(44-117) 161/56 (07/06 1200) SpO2:  [85 %-96 %] 95 % (07/06 1200) FiO2 (%):  [43 %-49 %] 49 % (07/06 1200) Weight:  [67.3 kg] 67.3 kg (07/06 0400)  Intake/Output from previous day: 07/05 0701 - 07/06 0700 In: 1004.5 [I.V.:294.7; IV Piggyback:709.8] Out: 9629 [Urine:1775] Intake/Output this shift: Total I/O In: 294.8 [I.V.:70.2; IV Piggyback:224.6] Out: 975 [Urine:975] Nutritional status:  Diet Order            DIET - DYS 1 Room service appropriate? Yes with Assist; Fluid consistency: Thin  Diet effective now                 Neurologic Exam: Mental Status: Patient alert and awake.  Speech fluent.  Follows simple commands.   Cranial Nerves: II: Blinks to bilateral confrontation III,IV, VI: extra-ocular motions intact bilaterally V,VII: smile symmetric Motor: Moves all extremities against gravity Sensory: Pinprick and light touch intact throughout, bilaterally   Lab Results: Basic Metabolic Panel: Recent Labs  Lab 09/15/19 1707 09/15/19 1725 09/15/19 2325 09/15/19 2325 09/16/19 0450 09/16/19 0450 09/17/19 0541 09/17/19 0641 09/18/19 0500 09/19/19 0523  NA   < >  --  135  --  135  --   --  139 139 141  K   < >  --  5.4*  --  5.1  --   --  4.6 3.7 3.4*  CL   < >  --  100  --  99  --   --  100 99 103  CO2   < >  --  26  --  27  --   --  28 32 27  GLUCOSE   < >  --  160*  --  141*  --   --  94 91 90  BUN   < >  --  58*  --  57*  --   --  45* 32* 21  CREATININE   < >  --  2.44*  --  2.14*  --   --  1.30* 1.13* 0.95  CALCIUM   < >  --   9.5   < > 9.4   < >  --  9.0 9.1 9.1  MG  --  1.9  --   --  2.0  --  1.9  --  1.8 2.0  PHOS  --  4.3  --   --  5.0*  --   --  4.4 3.0 2.2*   < > = values in this interval not displayed.    Liver Function Tests: Recent Labs  Lab 09/15/19 1513 09/17/19 0641 09/18/19 0500 09/19/19 0523  AST 40  --  60*  --   ALT 40  --  116*  --   ALKPHOS 61  --  87  --   BILITOT 0.7  --  1.6*  --   PROT 6.8  --  5.9*  --   ALBUMIN 3.7 2.9* 3.0* 2.9*   Recent  Labs  Lab 09/15/19 1725  LIPASE 29  AMYLASE 81   No results for input(s): AMMONIA in the last 168 hours.  CBC: Recent Labs  Lab 09/15/19 1513 09/16/19 0450 09/17/19 0541 09/18/19 0500 09/19/19 0523  WBC 15.0* 16.0* 17.9* 21.0* 18.6*  NEUTROABS 10.5*  --  14.6*  --   --   HGB 10.7* 10.0* 8.2* 8.0* 8.9*  HCT 31.6* 28.4* 23.5* 23.5* 25.1*  MCV 96.9 92.8 93.6 94.0 89.6  PLT 696* 439* 369 349 398    Cardiac Enzymes: No results for input(s): CKTOTAL, CKMB, CKMBINDEX, TROPONINI in the last 168 hours.  Lipid Panel: Recent Labs  Lab 09/15/19 2325 09/18/19 0913  TRIG 117 114    CBG: Recent Labs  Lab 09/18/19 1946 09/18/19 2359 09/19/19 0412 09/19/19 0741 09/19/19 1124  GLUCAP 70 84 80 88 98    Microbiology: Results for orders placed or performed during the hospital encounter of 09/15/19  Culture, blood (routine x 2)     Status: Abnormal   Collection Time: 09/15/19  3:13 PM   Specimen: BLOOD  Result Value Ref Range Status   Specimen Description   Final    BLOOD BLOOD LEFT HAND Performed at [facility], [address] [place], [place] [address]    Special Requests   Final    BOTTLES DRAWN AEROBIC AND ANAEROBIC Blood Culture results may not be optimal due to an excessive volume of blood received in culture bottles Performed at [facility], [address], [place], [place] [address]    Culture  Setup Time   Final    GRAM POSITIVE COCCI ANAEROBIC BOTTLE ONLY CRITICAL RESULT CALLED TO, READ  BACK BY AND VERIFIED WITH:  SHEEMA HALLAJI ON 09/16/2019 AT 1214 TIK    Culture (A)  Final    STAPHYLOCOCCUS SPECIES (COAGULASE NEGATIVE) THE SIGNIFICANCE OF ISOLATING THIS ORGANISM FROM A SINGLE SET OF BLOOD CULTURES WHEN MULTIPLE SETS ARE DRAWN IS UNCERTAIN. PLEASE NOTIFY THE MICROBIOLOGY DEPARTMENT WITHIN ONE WEEK IF SPECIATION AND SENSITIVITIES ARE REQUIRED. Performed at [facility], [place] [address], [place],  [address]    Report Status 09/19/2019 FINAL  Final  Blood Culture ID Panel (Reflexed)     Status: Abnormal   Collection Time: 09/15/19  3:13 PM  Result Value Ref Range Status   Enterococcus species NOT DETECTED NOT DETECTED Final   Listeria monocytogenes NOT DETECTED NOT DETECTED Final   Staphylococcus species DETECTED (A) NOT DETECTED Final    Comment: Methicillin (oxacillin) susceptible coagulase negative staphylococcus. Possible blood culture contaminant (unless isolated from more than one blood culture draw or clinical case suggests pathogenicity). No antibiotic treatment is indicated for blood  culture contaminants. CRITICAL RESULT CALLED TO, READ BACK BY AND VERIFIED WITH: SHEEMA HALLAJI ON 09/16/2019 AT 1214 TIK    Staphylococcus aureus (BCID) NOT DETECTED NOT DETECTED Final   Methicillin resistance NOT DETECTED NOT DETECTED Final   Streptococcus species NOT DETECTED NOT DETECTED Final   Streptococcus agalactiae NOT DETECTED NOT DETECTED Final   Streptococcus pneumoniae NOT DETECTED NOT DETECTED Final   Streptococcus pyogenes NOT DETECTED NOT DETECTED Final   Acinetobacter baumannii NOT DETECTED NOT DETECTED Final   Enterobacteriaceae species NOT DETECTED NOT DETECTED Final   Enterobacter cloacae complex NOT DETECTED NOT DETECTED Final   Escherichia coli NOT DETECTED NOT DETECTED Final   Klebsiella oxytoca NOT DETECTED NOT DETECTED Final   Klebsiella pneumoniae NOT DETECTED NOT DETECTED Final   Proteus species NOT DETECTED NOT DETECTED Final  Serratia  marcescens NOT DETECTED NOT DETECTED Final   Haemophilus influenzae NOT DETECTED NOT DETECTED Final   Neisseria meningitidis NOT DETECTED NOT DETECTED Final   Pseudomonas aeruginosa NOT DETECTED NOT DETECTED Final   Candida albicans NOT DETECTED NOT DETECTED Final   Candida glabrata NOT DETECTED NOT DETECTED Final   Candida krusei NOT DETECTED NOT DETECTED Final   Candida parapsilosis NOT DETECTED NOT DETECTED Final   Candida tropicalis NOT DETECTED NOT DETECTED Final    Comment: Performed at [facility], [address] [place], [place] [address]  Culture, blood (routine x 2)     Status: None (Preliminary result)   Collection Time: 09/15/19  3:50 PM   Specimen: BLOOD  Result Value Ref Range Status   Specimen Description BLOOD LEFT ARM  Final   Special Requests   Final    BOTTLES DRAWN AEROBIC AND ANAEROBIC Blood Culture adequate volume   Culture   Final    NO GROWTH 4 DAYS Performed at [facility], [address] [place], [place] [address]    Report Status PENDING  Incomplete  SARS Coronavirus 2 by RT PCR (hospital order, performed in [place] hospital lab) Nasopharyngeal Nasopharyngeal Swab     Status: None   Collection Time: 09/15/19  3:55 PM   Specimen: Nasopharyngeal Swab  Result Value Ref Range Status   SARS Coronavirus 2 NEGATIVE NEGATIVE Final    Comment: (NOTE) SARS-CoV-2 target nucleic acids are NOT DETECTED.  The SARS-CoV-2 RNA is generally detectable in upper and lower respiratory specimens during the acute phase of infection. The lowest concentration of SARS-CoV-2 viral copies this assay can detect is 250 copies / mL. A negative result does not preclude SARS-CoV-2 infection and should not be used as the sole basis for treatment or other patient management decisions.  A negative result may occur with improper specimen collection / handling, submission of specimen other than nasopharyngeal swab, presence of viral mutation(s) within the areas  targeted by this assay, and inadequate number of viral copies (<250 copies / mL). A negative result must be combined with clinical observations, patient history, and epidemiological information.  Fact Sheet for Patients:   StrictlyIdeas.no  Fact Sheet for Healthcare Providers: BankingDealers.co.za  This test is not yet approved or  cleared by the [place] FDA and has been authorized for detection and/or diagnosis of SARS-CoV-2 by FDA under an Emergency Use Authorization (EUA).  This EUA will remain in effect (meaning this test can be used) for the duration of the COVID-19 declaration under Section 564(b)(1) of the Act, 21 U.S.C. section 360bbb-3(b)(1), unless the authorization is terminated or revoked sooner.  Performed at [facility], [address], [place], [place] [address]   Urine Culture     Status: None   Collection Time: 09/15/19  6:11 PM   Specimen: Urine, Random  Result Value Ref Range Status   Specimen Description   Final    URINE, RANDOM Performed at [facility], [address] [place], [place] [address]    Special Requests   Final    NONE Performed at [facility], [address], [place], [place] [address]    Culture   Final    NO GROWTH Performed at [facility], [place] [address], [place], [place] [address]    Report Status 09/17/2019 FINAL  Final  MRSA PCR Screening     Status: None   Collection Time: 09/15/19  6:51 PM   Specimen: Nasal Mucosa; Nasopharyngeal  Result Value Ref Range Status   MRSA by PCR NEGATIVE NEGATIVE Final    Comment:        The GeneXpert MRSA Assay (FDA approved for NASAL specimens only), is one component of a comprehensive MRSA colonization surveillance program. It is not intended to diagnose MRSA infection nor to guide or monitor treatment for MRSA infections. Performed at [facility], [address] [place], [place]  [address]   CULTURE, BLOOD (ROUTINE X 2) w Reflex to ID Panel     Status: None (Preliminary result)   Collection Time: 09/18/19  9:13 AM   Specimen: BLOOD  Result Value Ref Range Status   Specimen Description BLOOD BLOOD RIGHT HAND  Final   Special Requests   Final    BOTTLES DRAWN AEROBIC AND ANAEROBIC Blood Culture adequate volume   Culture   Final    NO GROWTH < 24 HOURS Performed at [facility], [address] [place], [place] [address]    Report Status PENDING  Incomplete  CULTURE, BLOOD (ROUTINE X 2) w Reflex to ID Panel     Status: None (Preliminary result)   Collection Time: 09/18/19  9:45 AM   Specimen: BLOOD  Result Value Ref Range Status   Specimen Description BLOOD BLOOD RIGHT FOREARM  Final   Special Requests   Final    BOTTLES DRAWN AEROBIC AND ANAEROBIC Blood Culture adequate volume   Culture   Final    NO GROWTH < 24 HOURS Performed at [facility], [address] [place], [place] [address]    Report Status PENDING  Incomplete    Coagulation Studies: No results for input(s): LABPROT, INR in the last 72 hours.  Imaging: No results found.  Medications:  I have reviewed the patient's current medications. Scheduled: . aspirin  325 mg Oral Daily  . Chlorhexidine Gluconate Cloth  6 each Topical Daily  . furosemide  40 mg Intravenous Q12H  . insulin aspart  0-9 Units Subcutaneous Q4H  . ipratropium-albuterol  3 mL Nebulization Q4H  . pantoprazole  40 mg Oral Daily  . phosphorus  500 mg Oral BID  . 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 Pt was intubated in ED with suspected brief cardiac arrest likely due to hypoxia as O2 of 35 % on admission. Pt was found to have subclavian clot and is on heparin.Head CT shows no acute changes. EEG normal. Patient much improved and now extubated.   No further neurologic intervention is recommended at this time.  If further questions arise, please call or page at that time.  Thank you for allowing neurology to participate in the care of this patient.     LOS: 4 days   Pamela Goodell, MD Neurology [phone] 09/19/2019  1:04 PM

## 2019-09-19 NOTE — Progress Notes (Signed)
Pt resting in bed quietly with no s/s of distress noted. Pt is alert and oriented X4 at this time. She can follow commands and answer questions appropriately. Heparin gtt is infusing. Pt has had three BMs throughout shift. She received a total of 80mg  of lasix today to diuresis and attempt to lower BP. Purewick in place. Son has visited today. Pt has been febrile for most of the shift but last temp was down to 99.1.

## 2019-09-20 DIAGNOSIS — J9621 Acute and chronic respiratory failure with hypoxia: Secondary | ICD-10-CM

## 2019-09-20 DIAGNOSIS — R001 Bradycardia, unspecified: Secondary | ICD-10-CM

## 2019-09-20 LAB — CBC
HCT: 26.7 % — ABNORMAL LOW (ref 36.0–46.0)
Hemoglobin: 9.3 g/dL — ABNORMAL LOW (ref 12.0–15.0)
MCH: 32.1 pg (ref 26.0–34.0)
MCHC: 34.8 g/dL (ref 30.0–36.0)
MCV: 92.1 fL (ref 80.0–100.0)
Platelets: 438 10*3/uL — ABNORMAL HIGH (ref 150–400)
RBC: 2.9 MIL/uL — ABNORMAL LOW (ref 3.87–5.11)
RDW: 15.5 % (ref 11.5–15.5)
WBC: 14 10*3/uL — ABNORMAL HIGH (ref 4.0–10.5)
nRBC: 0 % (ref 0.0–0.2)

## 2019-09-20 LAB — RENAL FUNCTION PANEL
Albumin: 3.2 g/dL — ABNORMAL LOW (ref 3.5–5.0)
Anion gap: 16 — ABNORMAL HIGH (ref 5–15)
BUN: 25 mg/dL — ABNORMAL HIGH (ref 8–23)
CO2: 28 mmol/L (ref 22–32)
Calcium: 9.2 mg/dL (ref 8.9–10.3)
Chloride: 97 mmol/L — ABNORMAL LOW (ref 98–111)
Creatinine, Ser: 1.06 mg/dL — ABNORMAL HIGH (ref 0.44–1.00)
GFR calc Af Amer: 59 mL/min — ABNORMAL LOW (ref >60)
GFR calc non Af Amer: 51 mL/min — ABNORMAL LOW (ref >60)
Glucose, Bld: 92 mg/dL (ref 70–99)
Phosphorus: 4.2 mg/dL (ref 2.5–4.6)
Potassium: 3.1 mmol/L — ABNORMAL LOW (ref 3.5–5.1)
Sodium: 141 mmol/L (ref 135–145)

## 2019-09-20 LAB — TROPONIN I (HIGH SENSITIVITY)
Troponin I (High Sensitivity): 47 ng/L — ABNORMAL HIGH (ref <18)
Troponin I (High Sensitivity): 45 ng/L — ABNORMAL HIGH (ref <18)

## 2019-09-20 LAB — GLUCOSE, CAPILLARY
Glucose-Capillary: 140 mg/dL — ABNORMAL HIGH (ref 70–99)
Glucose-Capillary: 145 mg/dL — ABNORMAL HIGH (ref 70–99)
Glucose-Capillary: 86 mg/dL (ref 70–99)
Glucose-Capillary: 91 mg/dL (ref 70–99)
Glucose-Capillary: 96 mg/dL (ref 70–99)
Glucose-Capillary: 97 mg/dL (ref 70–99)

## 2019-09-20 LAB — CULTURE, BLOOD (ROUTINE X 2)
Culture: NO GROWTH
Special Requests: ADEQUATE

## 2019-09-20 LAB — BLOOD GAS, VENOUS
Acid-base deficit: 1 mmol/L (ref 0.0–2.0)
Bicarbonate: 26.9 mmol/L (ref 20.0–28.0)
FIO2: 100
O2 Saturation: 34.9 %
Patient temperature: 37
pCO2, Ven: 60 mmHg (ref 44.0–60.0)
pH, Ven: 7.26 (ref 7.250–7.430)
pO2, Ven: <31 mmHg — CL (ref 32.0–45.0)

## 2019-09-20 LAB — HEPARIN LEVEL (UNFRACTIONATED)
Heparin Unfractionated: 0.35 IU/mL (ref 0.30–0.70)
Heparin Unfractionated: 0.36 IU/mL (ref 0.30–0.70)

## 2019-09-20 LAB — MAGNESIUM: Magnesium: 2.1 mg/dL (ref 1.7–2.4)

## 2019-09-20 MED ORDER — ASPIRIN EC 81 MG PO TBEC
81.0000 mg | DELAYED_RELEASE_TABLET | Freq: Every day | ORAL | Status: DC
  Administered 2019-09-21 – 2019-09-27 (×7): 81 mg via ORAL
  Filled 2019-09-20 (×7): qty 1

## 2019-09-20 MED ORDER — RISAQUAD PO CAPS
1.0000 | ORAL_CAPSULE | Freq: Every day | ORAL | Status: DC
  Administered 2019-09-20 – 2019-09-27 (×8): 1 via ORAL
  Filled 2019-09-20 (×9): qty 1

## 2019-09-20 MED ORDER — LIDOCAINE VISCOUS HCL 2 % MT SOLN
15.0000 mL | Freq: Once | OROMUCOSAL | Status: AC
  Administered 2019-09-20: 15 mL via OROMUCOSAL
  Filled 2019-09-20: qty 15

## 2019-09-20 MED ORDER — ALUM & MAG HYDROXIDE-SIMETH 200-200-20 MG/5ML PO SUSP
30.0000 mL | Freq: Once | ORAL | Status: AC
  Administered 2019-09-20: 30 mL via ORAL
  Filled 2019-09-20: qty 30

## 2019-09-20 MED ORDER — POTASSIUM CHLORIDE 20 MEQ/15ML (10%) PO SOLN
40.0000 meq | Freq: Three times a day (TID) | ORAL | Status: AC
  Administered 2019-09-20 (×2): 40 meq
  Filled 2019-09-20 (×2): qty 30

## 2019-09-20 MED ORDER — DICYCLOMINE HCL 10 MG/5ML PO SOLN
10.0000 mg | Freq: Once | ORAL | Status: DC
  Filled 2019-09-20 (×2): qty 5

## 2019-09-20 NOTE — Progress Notes (Addendum)
Speech Language Pathology Treatment: Dysphagia  Patient Details Name: Pamela James MRN: [id number] DOB: 1946/01/03 Today's Date: 09/20/2019 Time: 9024-0973; 5329-9242 SLP Time Calculation (min) (ACUTE ONLY): 55 min  Assessment / Plan / Recommendation Clinical Impression  Pt seen x2 today(1x w/ thin liquids; 1x w/ added Minced food trials) for ongoing assessment of swallowing; trials to upgrade diet. Spoke w/ Son via phone who endorsed that pt does NOT use her Dentures when eating meals. She continues on HFNC O2 support; tolerating sips of liquids w/ NSG w/out s/s of aspiration reported; reduced appetite. Dtr brought in Dentures and a Minced foods Lunch meal. Pt presented w/ Lunch meal of upgraded food consistency -- dys level 2(Minced) foods including both minced chicken and mashed potatoes, the shredded barbeque pt requested. Minced foods were alternated w/ bites of puree to aid oral clearing when needed; sips of thin liquids were consumed before and during the meal as well as at first session this morning. No overt clinical s/s of aspiration noted during/post oral intake; no decline in vocal quality or respiratory status -- O2 sat remained in low 90s. Rest Breaks were instructed on, and given, to avoid increased WOB/SOB and effort d/t Baseline declined pulmonary status and need for HFNC O2 support. Oral phase c/b min increased time mashing and gumming the minced meat trials -- alternated w/ pureed foods and used mashed potatoes for cohesion. Pt chose NOT to wear her Dentures during eating -- she does not typically wear them at home, so they were NOT encouraged now. Educated pt and Dtr on checking for oral clearing and using lingual/finger sweeps b/t bites to fully clear BEFORE taking another bite of food. Pt helped to feed self; followed general aspiration precautions as cued by SLP and Dtr.  Recommend upgrade of diet to Minced foods (Dysphagia level 2) w/ gravies to moisten, Thin liquids. Aspiration  precautions; Pills in Puree for safer swallowing. Rest Breaks during meals d/t exertion. Recommend continue a more Minced foods diet for conservation of energy at this time; suspect pt can return to her baseline diet once discharged home. This, and education on aspiration precautions, was discussed w/ both Dtr and Son today. NSG updated and agreed. ST services will be available if any further needs while admitted.     HPI HPI: Pt is a 74 year old who presented with acute on chronic respiratory failure and severe metabolic abnormalities.  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  It appears she had received some narcotic medication for pain.  She was noted to be in respiratory distress and failure on arrival to the emergency room.  She was clearly altered and in significant distress and required emergent intubation.  Subsequently she had a brief episode of arrest where she was noted to be bradycardic and hypotensive.  She has severe metabolic abnormalities to include severe hyperkalemia.  She also has what appears to be acute on chronic renal failure.  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 She had reportedly been doing well since her procedure, but had been taking oxycodone regularly for pain.  Pt has a past medical history of hypertension, COPD, anxiety/depression, and GERD.  Dtr present, and pt, denied any swallowing problems at home.  Per MD note, Seizure like activity noted; pt started on Keppra. Pt was briefly orally intubated x2 days~.  SLP Plan  Continue with current plan of care (will attempt to obtain the dentures for po food trials)       Recommendations  Diet recommendations: Dysphagia 1 (puree);Thin liquid Liquids provided via: Cup;Straw Medication Administration: Whole meds with puree (vs  Crushed in Puree if needed) Supervision: Patient able to self feed;Intermittent supervision to cue for compensatory strategies;Staff to assist with self feeding Compensations: Minimize environmental distractions;Slow rate;Small sips/bites;Lingual sweep for clearance of pocketing;Follow solids with liquid Postural Changes and/or Swallow Maneuvers: Seated upright 90 degrees;Upright 30-60 min after meal                General recommendations:  (Dietician f/u) Oral Care Recommendations: Oral care BID;Oral care before and after PO;Staff/trained caregiver to provide oral care Follow up Recommendations: None SLP Visit Diagnosis: Dysphagia, unspecified (R13.10) (does not have Dentures present) Plan: Continue with current plan of care (will attempt to obtain the dentures for po food trials)       Pamela James, Pamela James, CCC-SLP Pamela James 09/20/2019, 11:16 AM

## 2019-09-20 NOTE — Plan of Care (Signed)
Per care plan 

## 2019-09-20 NOTE — Progress Notes (Signed)
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  SIGNIFICANT EVENTS  7/2-Presented to ED 7/2-Transient loss of pulse and hypoxia on the vent requiring manual bag ventilation and CPR with ROSC 7/2-Started on Dopamine Gtt 7/3-Remains intubated and sedated 7/3-Seizure like activity noted overnight started on Keppra 7/4-Remain intubated and mechanically ventillated 09/18/19- no overnight events, weaning precedex drip, optimizing for downgrade from ICU to [facility] 09/19/19- removal HD cath, removal foley, d/cd precedex , optimizing for TRH.  09/20/19- patient had episode of chest discomfort today, EKG done reviewed without concerning findings, troponin pending. TRH transfer.   STUDIES:  7/2- CXR>>interval development of diffuse left lung opacity concerning for atelectasis or pneumonia with some degree of volume loss causing mild right to left mediastinal shift 7/2- CT head>> No acute intracranial abnormality 7/3- US Renal>> Pending 7/3-US Venous LUE>> 7/3- EEG>>Shows no evidence of seizure activity 7/4-Chest xray show no evidence of acute cardiopulmonary disease  CULTURES: SARS-CoV-2 PCR 7/2>> Negative MRSA PCR 7/2>>Negative Blood culture 7/2>BCx 1 of 4 GPC. BCID: staph species.  Urine  culture 7/2>No growth Strep Pneumo Urinary antigen 7/2>> Negative Legionella Pneumo Ur Ag 7/2> Pending  ANTIBIOTICS: Unasyn 7/2>>  PAST MEDICAL HISTORY :   has a past medical history of Anxiety, Asthma, COPD (chronic obstructive pulmonary disease) ([place]), Depression, GERD (gastroesophageal reflux disease), and Hypertension.  has a past surgical history that includes Tubal ligation; Total hip arthroplasty (Left); and Abdominal hysterectomy.   Prior to Admission medications   Medication Sig Start Date End Date Taking? Authorizing Provider  acetaminophen (TYLENOL) 500 MG tablet Take 500-1,000 mg by mouth every 6 (six) hours as needed for moderate pain or fever.     [provider]  albuterol (ACCUNEB) 1.25 MG/3ML nebulizer solution Take 1 ampule by nebulization every 6 (six) hours as needed for wheezing or shortness of breath.    [provider]  albuterol (PROVENTIL HFA;VENTOLIN HFA) 108 (90 Base) MCG/ACT inhaler Inhale 2 puffs into the lungs every 6 (six) hours as needed for wheezing or shortness of breath.    [provider]  amLODipine (NORVASC) 10 MG tablet Take 10 mg by mouth daily.    [provider]  aspirin EC 81 MG EC tablet Take 1 tablet (81 mg total) by mouth daily. 03/27/16   Hillary Bow, MD  carvedilol (COREG) 12.5 MG tablet Take 1 tablet (12.5 mg total) by mouth 2 (two) times daily with a meal. 11/23/17   Mody, Sital, MD  Fluticasone-Salmeterol (ADVAIR) 250-50 MCG/DOSE AEPB Inhale 1 puff into the lungs every 12 (twelve) hours.     [provider]  ipratropium (ATROVENT HFA) 17 MCG/ACT inhaler Inhale 2 puffs into the lungs 3 (three) times daily.     [provider]  lisinopril (PRINIVIL,ZESTRIL) 40 MG tablet Take 40 mg by mouth daily.    [provider]  nicotine (NICODERM CQ - DOSED IN MG/24 HOURS)  21 mg/24hr patch Place 1 patch (21 mg total) onto the skin daily. 11/23/17   Bettey Costa, MD  omeprazole (PRILOSEC) 20 MG  capsule Take 20 mg by mouth daily.    [provider]  pravastatin (PRAVACHOL) 20 MG tablet Take 20 mg by mouth every evening.     [provider]  ranitidine (ZANTAC) 150 MG capsule Take 150 mg by mouth 2 (two) times daily.    [provider]   Scheduled Meds: . [START ON 09/21/2019] aspirin EC  81 mg Oral Daily  . Chlorhexidine Gluconate Cloth  6 each Topical Daily  . insulin aspart  0-9 Units Subcutaneous Q4H  . ipratropium-albuterol  3 mL Nebulization Q4H  . pantoprazole  40 mg Oral Daily  . polyethylene glycol  17 g Oral Daily  . potassium chloride  40 mEq Per Tube TID   Continuous Infusions: . sodium chloride Stopped (09/18/19 0815)  . ampicillin-sulbactam (UNASYN) IV Stopped (09/20/19 2353)  . heparin Stopped (09/20/19 0819)   PRN Meds:.sodium chloride, acetaminophen, acetaminophen, hydrALAZINE, ondansetron (ZOFRAN) IV, polyethylene glycol   Allergies  Allergen Reactions  . Aspirin Other (See Comments)    GI ulcers  . Morphine And Related Other (See Comments)    Delerium, itching  . Nsaids Other (See Comments)    GI ulcers    FAMILY HISTORY:  family history includes Heart disease in her father and sister. SOCIAL HISTORY:  reports that she has been smoking. She has never used smokeless tobacco. She reports that she does not drink alcohol and does not use drugs.  REVIEW OF SYSTEMS:   UNABLE TO ASSESS DUE TO INTUBATION AND SEDATED  VITAL SIGNS: Temp:  [98.1 F (36.7 C)-99.7 F (37.6 C)] 99.1 F (37.3 C) (07/07 0800) Pulse Rate:  [63-88] 74 (07/07 1020) Resp:  [16-31] 31 (07/07 1020) BP: (88-193)/(51-122) 88/54 (07/07 1020) SpO2:  [87 %-98 %] 92 % (07/07 1020) FiO2 (%):  [35 %-49 %] 35 % (07/07 0756) Weight:  [63.5 kg] 63.5 kg (07/07 0441)  PHYSICAL EXAMINATION: GENERAL: 74 year old patient lying in the bed on intubated and mechanically ventillated EYES:  No scleral icterus. Extraocular muscles intact.  HEENT: Head atraumatic,  normocephalic. Oropharynx and nasopharynx clear.  NECK:  Supple, no jugular venous distention. No thyroid enlargement, no tenderness.  LUNGS: Decreased breath sounds bilaterally, no wheezing, rales,rhonchi or crepitation. No use of accessory muscles of respiration.  CARDIOVASCULAR: S1, S2 normal. No murmurs, rubs, or gallops.  ABDOMEN: Soft, nontender, nondistended. Bowel sounds present. No organomegaly or mass.  EXTREMITIES: Generalized dependent edema, cyanosis, or clubbing.   NEUROLOGIC: confusion is iimproved  PSYCHIATRIC: Unable to assess.  SKIN: No obvious rash, lesion, or ulcer.   Recent Labs  Lab 09/18/19 0500 09/19/19 0523 09/20/19 0826  NA 139 141 141  K 3.7 3.4* 3.1*  CL 99 103 97*  CO2 32 27 28  BUN 32* 21 25*  CREATININE 1.13* 0.95 1.06*  GLUCOSE 91 90 92   Recent Labs  Lab 09/18/19 0500 09/19/19 0523 09/20/19 0826  HGB 8.0* 8.9* 9.3*  HCT 23.5* 25.1* 26.7*  WBC 21.0* 18.6* 14.0*  PLT 349 398 438*   No results found.  Date: 09/15/2019 EKG Time: 14:36 Rate: 74 Rhythm: 2nd degree heart block vs 1st degree block with PAC's Axis: Normal Intervals:none ST&T Change: peaked T waves   ASSESSMENT / PLAN:   Acute Hypoxic  Respiratory Failure secondary to Aspiration Pneumonia? COPD without evidence of acute exacerbation -Weaned off sedation - fever curve normalized  -  CXR reviewd by me today - no inifltrate noted -Continue Unasyn 7d total -As needed bronchodilators -Encourage smoking cessation   Bradycardia -resolved S/p transient loss of pulse with ROSC -EKG shows questionable Junctional rhythm/slow afib -Now ff Dopamine gtt - electrolyte replacement -Cardiology input appreciated  Acute Metabolic Encephalopathy - secondary to severe metabolic derangement - resolved -Continue to treat underlying medical issues -CT head 7/3  negative for acute intracranial abnormality  Myoclonic Jerking - resolved -EEG shows no evidence of seizure activity  -Ativan PRN for seizures -Continue Seizure precaution -Discontinue Keppra -Neurology input appreciated  Acute Kidney Injury Severe Hyperkalemia on presentation >7.5 now improved 4.6 this am -Monitor I&O's / urinary output -Follow BMP -Ensure adequate renal perfusion -Avoid nephrotoxic agents as able -Replace electrolytes as indicated -Nephrology input appreciated  Left Ureteral tumor Hx: Left hydronephrosis s/p  LEFT Cystourethroscopy, with Ureteroscopy And/Or Pyeloscopy; 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    Critical care provider statement:    Critical care time (minutes):  33   Critical care time was exclusive of:  Separately billable procedures and  treating other patients   Critical care was necessary to treat or prevent imminent or  life-threatening deterioration of the following conditions:  encephalopathy due to metabolic derrangements, COPD, emphysema, acute hypoxemic respiratory failure, multiple comorbid conditions.    Critical care was time spent personally by me on the following  activities:  Development of treatment plan with patient or surrogate,  discussions with consultants, evaluation of patient's response to  treatment, examination of patient, obtaining history from patient or  surrogate, ordering and performing treatments and interventions, ordering  and review of laboratory studies and re-evaluation of patient's condition   I assumed direction of critical care for this patient from another  provider in my specialty: no     09/20/2019, 10:49 AM    Ottie Glazier, M.D.  [facility] [place]

## 2019-09-20 NOTE — Consult Note (Signed)
ANTICOAGULATION CONSULT NOTE  Pharmacy Consult for Heparin infusion  Indication: Subclavian clot  Patient Measurements: Height: 5\' 6"  (167.6 cm) Weight: 67.3 kg (148 lb 5.9 oz) IBW/kg (Calculated) : 59.3  Vital Signs: Temp: 98.6 F (37 C) (07/06 2340) Temp Source: Oral (07/06 2340) BP: 179/55 (07/06 2340) Pulse Rate: 88 (07/06 2340)  Labs: Recent Labs     0000 09/17/19 0541 09/17/19 0641 09/17/19 1456 09/18/19 0500 09/18/19 0913 09/19/19 0523 09/19/19 1508 09/19/19 2328  HGB   < > 8.2*  --   --  8.0*  --  8.9*  --   --   HCT  --  23.5*  --   --  23.5*  --  25.1*  --   --   PLT  --  369  --   --  349  --  398  --   --   HEPARINUNFRC  --   --   --    < >  --    < > 0.18* 0.39 0.35  CREATININE  --   --  1.30*  --  1.13*  --  0.95  --   --    < > = values in this interval not displayed.    Estimated Creatinine Clearance: 47.9 mL/min (by C-G formula based on SCr of 0.95 mg/dL).   Medical History: Past Medical History:  Diagnosis Date  . Anxiety   . Asthma   . COPD (chronic obstructive pulmonary disease) ([place])   . Depression   . GERD (gastroesophageal reflux disease)   . Hypertension    Assessment: Pharmacy consulted for heparin infusion dosing and monitoring for 74 yo female with a subclavian clot. She is on no chronic anticoagulation PTA. H&H, platelets have been trending down but appear to have stabilized here.   Heparin Course: 7/3 initiation: 4000 unit bolus then 1150 units/hr 7/3 2023 HL 1.19: dec to 700 units/hr 7/4 0641 HL 0.27: inc to 800 units/hr 7/4 2142 HL 0.20: inc to 950 units/hr 7/5 0913 HL 0.33: no change 7/5 1601 HL 0.30: no change 7/6 0523 HL 0.18: inc to 1150 units/hr 7/6 1508 HL 0.39: no change 7/6 2328 HL 0.35: no change  Goal of Therapy:  Heparin level 0.3-0.7 units/ml Monitor platelets by anticoagulation protocol: Yes   Plan:   Heparin level therapeutic: continue infusion at 1150 units/hr   recheck heparin level in 8 hours.   CBC in am  Ena Dawley, PharmD Clinical Pharmacist 09/20/2019 1:21 AM

## 2019-09-20 NOTE — Progress Notes (Signed)
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.  Was admitted on 7/2 with acute on chronic hypoxic respiratory failure and altered mental status in the setting of COPD exacerbation.  She required intubation on admission.  She apparently had been receiving pain medications for pain related to her urologic procedure and had brief loss of pulse and received chest compressions.  Her ICU course was slightly prolonged due to an apparent withdrawal syndrome, possibly due to alcohol but unknown, requiring Precedex drip.  She had an apparent aspiration event as well and is on Unasyn.  She has since been extubated and is off all drip medications and stable for transfer to [facility] service tomorrow, 09/21/2019.

## 2019-09-20 NOTE — Progress Notes (Signed)
Pt A&OX4. Symmetrical. 94% on 6L nasal cannula. Complains of chest pain this afternoon, MD made aware. EKG, troponins and GI cocktail ordered. Per MD do not administer Bentyl due to possible side effects. Poor appetite. Adequate urine output, Dark tea color MD aware. Family updated at bedside. Two loose stools this shift. Will continue to monitor.

## 2019-09-20 NOTE — Consult Note (Signed)
PHARMACY CONSULT NOTE - FOLLOW UP  Pharmacy Consult for Electrolyte Monitoring and Replacement   Recent Labs: Potassium (mmol/L)  Date Value  09/20/2019 3.1 (L)   Magnesium (mg/dL)  Date Value  09/20/2019 2.1   Calcium (mg/dL)  Date Value  09/20/2019 9.2   Albumin (g/dL)  Date Value  09/20/2019 3.2 (L)   Phosphorus (mg/dL)  Date Value  09/20/2019 4.2   Sodium (mmol/L)  Date Value  09/20/2019 141    Assessment: 74 y.o.femalewith PMH significant for COPD, Asthma, GERD, MGUS, Hydronephrosis of left Kidney, HLD, Anxiety and Depression, Thrombocytosis, Bipolar disorder, Thyroid disease, tobacco abuse, bleeding disorder,hx of gross hematuria and left hydronephrosis, s/p left ureteroscopy 5/27/2  Goal of Therapy:  Electrolytes WNL  Plan:   KCl 40 mEq oral x 2  Re-check electrolytes in am 7/8  Dallie Piles ,PharmD Clinical Pharmacist 09/20/2019 9:29 AM

## 2019-09-20 NOTE — Progress Notes (Signed)
[facility]    SUBJECTIVE: Patient states to be feeling much better feeling more active has been eating okay needs physical therapy blood pressure still running slightly low   Vitals:   09/20/19 1017 09/20/19 1018 09/20/19 1019 09/20/19 1020  BP: (!) 90/51   (!) 88/54  Pulse: 73 74 78 74  Resp: (!) 23 (!) 29 19 (!) 31  Temp:      TempSrc:      SpO2: 92% 93% 93% 92%  Weight:      Height:         Intake/Output Summary (Last 24 hours) at 09/20/2019 1156 Last data filed at 09/20/2019 1937 Gross per 24 hour  Intake 960.56 ml  Output 2350 ml  Net -1389.44 ml      PHYSICAL EXAM  General: Well developed, well nourished, in no acute distress HEENT:  Normocephalic and atramatic Neck:  No JVD.  Lungs: Clear bilaterally to auscultation and percussion. Heart: HRRR . Normal S1 and S2 without gallops or murmurs.  Abdomen: Bowel sounds are positive, abdomen soft and non-tender  Msk:  Back normal, normal gait. Normal strength and tone for age. Extremities: No clubbing, cyanosis or edema.   Neuro: Alert and oriented X 3. Psych:  Good affect, responds appropriately   LABS: Basic Metabolic Panel: Recent Labs    09/19/19 0523 09/20/19 0826  NA 141 141  K 3.4* 3.1*  CL 103 97*  CO2 27 28  GLUCOSE 90 92  BUN 21 25*  CREATININE 0.95 1.06*  CALCIUM 9.1 9.2  MG 2.0 2.1  PHOS 2.2* 4.2   Liver Function Tests: Recent Labs    09/18/19 0500 09/18/19 0500 09/19/19 0523 09/20/19 0826  AST 60*  --   --   --   ALT 116*  --   --   --   ALKPHOS 87  --   --   --   BILITOT 1.6*  --   --   --   PROT 5.9*  --   --   --   ALBUMIN 3.0*   < > 2.9* 3.2*   < > = values in this interval not displayed.   No results for input(s): LIPASE, AMYLASE in the last 72 hours. CBC: Recent Labs    09/19/19 0523 09/20/19 0826  WBC 18.6* 14.0*  HGB 8.9* 9.3*  HCT 25.1* 26.7*  MCV 89.6 92.1  PLT 398 438*   Cardiac Enzymes: No results for input(s): CKTOTAL, CKMB, CKMBINDEX, TROPONINI in the  last 72 hours. BNP: Invalid input(s): POCBNP D-Dimer: No results for input(s): DDIMER in the last 72 hours. Hemoglobin A1C: No results for input(s): HGBA1C in the last 72 hours. Fasting Lipid Panel: Recent Labs    09/18/19 0913  TRIG 114   Thyroid Function Tests: No results for input(s): TSH, T4TOTAL, T3FREE, THYROIDAB in the last 72 hours.  Invalid input(s): FREET3 Anemia Panel: No results for input(s): VITAMINB12, FOLATE, FERRITIN, TIBC, IRON, RETICCTPCT in the last 72 hours.  No results found.   Echo preserved left ventricular function 60%  TELEMETRY: Sinus bradycardia  ASSESSMENT AND PLAN:  Active Problems:   Acute on chronic respiratory failure with hypoxia (HCC) Bradycardia Shortness of breath dyspnea Generalized weakness Mild hypotension  Plan Continue supportive therapy Avoid rate blocking drugs Hydrate for hypotension Continue respiratory support Recommend physical therapy Complete antibiotic course for sepsis   Yolonda Kida, MD 09/20/2019 11:56 AM

## 2019-09-20 NOTE — Progress Notes (Signed)
[facility]    SUBJECTIVE: Patient still intubated status post acute hypoxic respiratory failure with encephalopathy patient is still not responsive to interview and exam   Vitals:   09/20/19 0500 09/20/19 0600 09/20/19 0700 09/20/19 0800  BP: (!) 176/62 102/80 (!) 165/65   Pulse: 66 72 64   Resp: 17 19 (!) 24   Temp:    99.1 F (37.3 C)  TempSrc:    Axillary  SpO2: 93% 95% 96%   Weight:      Height:         Intake/Output Summary (Last 24 hours) at 09/20/2019 5701 Last data filed at 09/20/2019 7793 Gross per 24 hour  Intake 1255.35 ml  Output 3325 ml  Net -2069.65 ml      PHYSICAL EXAM  General: Well developed, well nourished, in no acute distress intubated sedated HEENT:  Normocephalic and atramatic Neck:  No JVD.  Lungs: Clear bilaterally to auscultation and percussion. Heart: HRRR . Normal S1 and S2 without gallops or murmurs.  Abdomen: Bowel sounds are positive, abdomen soft and non-tender  Msk:  Back normal, normal gait. Normal strength and tone for age. Extremities: No clubbing, cyanosis or edema.   Neuro: Alert and oriented X 3. Psych:  Good affect, responds appropriately   LABS: Basic Metabolic Panel: Recent Labs    09/18/19 0500 09/19/19 0523  NA 139 141  K 3.7 3.4*  CL 99 103  CO2 32 27  GLUCOSE 91 90  BUN 32* 21  CREATININE 1.13* 0.95  CALCIUM 9.1 9.1  MG 1.8 2.0  PHOS 3.0 2.2*   Liver Function Tests: Recent Labs    09/18/19 0500 09/19/19 0523  AST 60*  --   ALT 116*  --   ALKPHOS 87  --   BILITOT 1.6*  --   PROT 5.9*  --   ALBUMIN 3.0* 2.9*   No results for input(s): LIPASE, AMYLASE in the last 72 hours. CBC: Recent Labs    09/18/19 0500 09/19/19 0523  WBC 21.0* 18.6*  HGB 8.0* 8.9*  HCT 23.5* 25.1*  MCV 94.0 89.6  PLT 349 398   Cardiac Enzymes: No results for input(s): CKTOTAL, CKMB, CKMBINDEX, TROPONINI in the last 72 hours. BNP: Invalid input(s): POCBNP D-Dimer: No results for input(s): DDIMER in the last 72  hours. Hemoglobin A1C: No results for input(s): HGBA1C in the last 72 hours. Fasting Lipid Panel: Recent Labs    09/18/19 0913  TRIG 114   Thyroid Function Tests: No results for input(s): TSH, T4TOTAL, T3FREE, THYROIDAB in the last 72 hours.  Invalid input(s): FREET3 Anemia Panel: No results for input(s): VITAMINB12, FOLATE, FERRITIN, TIBC, IRON, RETICCTPCT in the last 72 hours.  No results found.   Echo normal left ventricular function ejection fraction around 60%  TELEMETRY: Normal sinus rhythm nonspecific ST-T wave changes  ASSESSMENT AND PLAN:  Active Problems:   Acute on chronic respiratory failure with hypoxia (HCC) Bradycardia Encephalopathy Intubated with encephalopathy COPD exacerbation Acute hypoxic respiratory failure   presumed related to sepsis Acute on chronic renal insufficiency DVT upper arm Left urethral tumor  Plan Continue supportive critical care Agree with broad-spectrum antibiotic therapy for sepsis Wean from vent when able Continue anticoagulation for DVT on her left arm Respiratory support for hypoxic respiratory failure Continue fluids for mild hypotension no longer need pressors Continue supportive cardiac care no invasive strategy recommended at this point    Yolonda Kida, MD 09/20/2019 8:37 AM

## 2019-09-20 NOTE — Consult Note (Signed)
ANTICOAGULATION CONSULT NOTE  Pharmacy Consult for Heparin infusion  Indication: Subclavian clot  Patient Measurements: Height: 5\' 6"  (167.6 cm) Weight: 63.5 kg (139 lb 15.9 oz) IBW/kg (Calculated) : 59.3  Vital Signs: Temp: 99.3 F (37.4 C) (07/07 0441) Temp Source: Oral (07/07 0441) BP: 102/80 (07/07 0600) Pulse Rate: 72 (07/07 0600)  Labs: Recent Labs    09/18/19 0500 09/18/19 0913 09/19/19 0523 09/19/19 1508 09/19/19 2328  HGB 8.0*  --  8.9*  --   --   HCT 23.5*  --  25.1*  --   --   PLT 349  --  398  --   --   HEPARINUNFRC  --    < > 0.18* 0.39 0.35  CREATININE 1.13*  --  0.95  --   --    < > = values in this interval not displayed.    Estimated Creatinine Clearance: 47.9 mL/min (by C-G formula based on SCr of 0.95 mg/dL).   Medical History: Past Medical History:  Diagnosis Date  . Anxiety   . Asthma   . COPD (chronic obstructive pulmonary disease) ([place])   . Depression   . GERD (gastroesophageal reflux disease)   . Hypertension    Assessment: Pharmacy consulted for heparin infusion dosing and monitoring for 74 yo female with a subclavian clot. She is on no chronic anticoagulation PTA. H&H, platelets have been trending down but appear to have stabilized here.   Heparin Course: 7/3 initiation: 4000 unit bolus then 1150 units/hr 7/3 2023 HL 1.19: dec to 700 units/hr 7/4 0641 HL 0.27: inc to 800 units/hr 7/4 2142 HL 0.20: inc to 950 units/hr 7/5 0913 HL 0.33: no change 7/5 1601 HL 0.30: no change 7/6 0523 HL 0.18: inc to 1150 units/hr 7/6 1508 HL 0.39: no change 7/6 2328 HL 0.35: no change 7/7 0826 HL 0.36: no change  Goal of Therapy:  Heparin level 0.3-0.7 units/ml Monitor platelets by anticoagulation protocol: Yes   Plan:   Heparin level therapeutic: continue infusion at 1150 units/hr   This is the 3rd consecutive therapeutic heparin level: recheck heparin level in am and daily  CBC in am  Dallie Piles, PharmD Clinical  Pharmacist 09/20/2019 7:05 AM

## 2019-09-21 ENCOUNTER — Inpatient Hospital Stay: Payer: Medicare HMO

## 2019-09-21 ENCOUNTER — Encounter: Payer: Self-pay | Admitting: Pulmonary Disease

## 2019-09-21 DIAGNOSIS — R319 Hematuria, unspecified: Secondary | ICD-10-CM

## 2019-09-21 LAB — CBC
HCT: 27 % — ABNORMAL LOW (ref 36.0–46.0)
Hemoglobin: 9.4 g/dL — ABNORMAL LOW (ref 12.0–15.0)
MCH: 31.2 pg (ref 26.0–34.0)
MCHC: 34.8 g/dL (ref 30.0–36.0)
MCV: 89.7 fL (ref 80.0–100.0)
Platelets: 456 10*3/uL — ABNORMAL HIGH (ref 150–400)
RBC: 3.01 MIL/uL — ABNORMAL LOW (ref 3.87–5.11)
RDW: 15.4 % (ref 11.5–15.5)
WBC: 12.1 10*3/uL — ABNORMAL HIGH (ref 4.0–10.5)
nRBC: 0 % (ref 0.0–0.2)

## 2019-09-21 LAB — URINALYSIS, COMPLETE (UACMP) WITH MICROSCOPIC
Bacteria, UA: NONE SEEN
Bilirubin Urine: NEGATIVE
Glucose, UA: NEGATIVE mg/dL
Ketones, ur: NEGATIVE mg/dL
Nitrite: NEGATIVE
Protein, ur: 100 mg/dL — AB
RBC / HPF: >50 RBC/hpf — ABNORMAL HIGH (ref 0–5)
Specific Gravity, Urine: 1.018 (ref 1.005–1.030)
WBC, UA: >50 WBC/hpf — ABNORMAL HIGH (ref 0–5)
pH: 9 — ABNORMAL HIGH (ref 5.0–8.0)

## 2019-09-21 LAB — GLUCOSE, CAPILLARY
Glucose-Capillary: 97 mg/dL (ref 70–99)
Glucose-Capillary: 95 mg/dL (ref 70–99)
Glucose-Capillary: 99 mg/dL (ref 70–99)
Glucose-Capillary: 110 mg/dL — ABNORMAL HIGH (ref 70–99)
Glucose-Capillary: 99 mg/dL (ref 70–99)

## 2019-09-21 LAB — RENAL FUNCTION PANEL
Albumin: 3.1 g/dL — ABNORMAL LOW (ref 3.5–5.0)
Anion gap: 11 (ref 5–15)
BUN: 25 mg/dL — ABNORMAL HIGH (ref 8–23)
CO2: 28 mmol/L (ref 22–32)
Calcium: 9.4 mg/dL (ref 8.9–10.3)
Chloride: 104 mmol/L (ref 98–111)
Creatinine, Ser: 1 mg/dL (ref 0.44–1.00)
GFR calc Af Amer: >60 mL/min (ref >60)
GFR calc non Af Amer: 55 mL/min — ABNORMAL LOW (ref >60)
Glucose, Bld: 106 mg/dL — ABNORMAL HIGH (ref 70–99)
Phosphorus: 2.9 mg/dL (ref 2.5–4.6)
Potassium: 4.2 mmol/L (ref 3.5–5.1)
Sodium: 143 mmol/L (ref 135–145)

## 2019-09-21 LAB — MAGNESIUM: Magnesium: 2.1 mg/dL (ref 1.7–2.4)

## 2019-09-21 LAB — HEPARIN LEVEL (UNFRACTIONATED): Heparin Unfractionated: 0.35 IU/mL (ref 0.30–0.70)

## 2019-09-21 MED ORDER — IOHEXOL 300 MG/ML  SOLN
100.0000 mL | Freq: Once | INTRAMUSCULAR | Status: AC | PRN
  Administered 2019-09-21: 100 mL via INTRAVENOUS

## 2019-09-21 NOTE — Consult Note (Signed)
ANTICOAGULATION CONSULT NOTE  Pharmacy Consult for Heparin infusion  Indication: Subclavian clot  Patient Measurements: Height: 5\' 6"  (167.6 cm) Weight: 63.5 kg (139 lb 15.9 oz) IBW/kg (Calculated) : 59.3  Vital Signs: Temp: 99.4 F (37.4 C) (07/07 2000) Temp Source: Oral (07/07 2000) BP: 135/63 (07/08 0200) Pulse Rate: 70 (07/08 0200)  Labs: Recent Labs    09/19/19 0523 09/19/19 0523 09/19/19 1508 09/19/19 2328 09/20/19 0826 09/20/19 1548 09/20/19 1715 09/21/19 0440  HGB 8.9*   < >  --   --  9.3*  --   --  9.4*  HCT 25.1*  --   --   --  26.7*  --   --  27.0*  PLT 398  --   --   --  438*  --   --  456*  HEPARINUNFRC 0.18*  --    < > 0.35 0.36  --   --  0.35  CREATININE 0.95  --   --   --  1.06*  --   --  1.00  TROPONINIHS  --   --   --   --   --  47* 45*  --    < > = values in this interval not displayed.    Estimated Creatinine Clearance: 45.5 mL/min (by C-G formula based on SCr of 1 mg/dL).  Medical History: Past Medical History:  Diagnosis Date  . Anxiety   . Asthma   . COPD (chronic obstructive pulmonary disease) ([place])   . Depression   . GERD (gastroesophageal reflux disease)   . Hypertension    Assessment: Pharmacy consulted for heparin infusion dosing and monitoring for 74 yo female with a subclavian clot. She is on no chronic anticoagulation PTA. H&H, platelets have been trending down but appear to have stabilized here.   Heparin Course: 7/3 initiation: 4000 unit bolus then 1150 units/hr 7/3 2023 HL 1.19: dec to 700 units/hr 7/4 0641 HL 0.27: inc to 800 units/hr 7/4 2142 HL 0.20: inc to 950 units/hr 7/5 0913 HL 0.33: no change 7/5 1601 HL 0.30: no change 7/6 0523 HL 0.18: inc to 1150 units/hr 7/6 1508 HL 0.39: no change 7/6 2328 HL 0.35: no change 7/7 0826 HL 0.36: no change 7/8 0440 HL 0.35: no change  Goal of Therapy:  Heparin level 0.3-0.7 units/ml Monitor platelets by anticoagulation protocol: Yes   Plan:   Heparin level therapeutic:  continue infusion at 1150 units/hr   This is the 4th consecutive therapeutic heparin level: recheck heparin level in am and daily  CBC in am  Ena Dawley, PharmD Clinical Pharmacist 09/21/2019 6:28 AM

## 2019-09-21 NOTE — Progress Notes (Signed)
Pt A&OX4. VSS. HFNC at 2 L, 92-94%. Adequate dark tea colored urine output. Tolerating diet with poor appetite. LUE elevated. Pulses intact and moves symmetrically in all extremities. NSR on monitor. Will continue to monitor.

## 2019-09-21 NOTE — Progress Notes (Signed)
PROGRESS NOTE    Cristela Stalder    Code Status: Full Code  PJK:[id number] DOB: [birth year] DOA: 09/15/2019 LOS: 6 days  PCP: Cindra Eves, MD CC:  Chief Complaint  Patient presents with  . 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  Was admitted on 7/2 with acute on chronic hypoxic respiratory failure and altered mental status in the setting of COPD exacerbation.  She required intubation on admission.  She apparently had been receiving pain medications for pain related to her urologic procedure and had brief loss of pulse and received chest compressions.  Her ICU course was slightly prolonged due to an apparent withdrawal syndrome, possibly due to alcohol but unknown, requiring Precedex drip.  She had an apparent aspiration event as well and is on Unasyn.  She has since been extubated and is off all drip medications and stable for transfer to [facility] service 09/21/2019.  Noted to have dark/cloudy urine on 7/8 and renal US was ordered which showed simple left renal cyst and mild bladder wall thickening posteriorly with a questionable small perinephric fluid collection adjacent to mid right kidney.  CT was ordered for further evaluation which showed left ureteral stent grossly good position without any significant hydronephrosis or obstruction and small nonobstructive renal calculi on the right without any other significant findings. Urology was consulted    A & P   Active Problems:   Acute on chronic respiratory failure with hypoxia (HCC)   1. Acute hypoxic respiratory failure secondary to possible aspiration pneumonia  a. Extubated and  off sedation, currently on nasal cannula b. Leukocytosis improved c. Day 7/7 Unasyn d. As needed bronchodilators e. Encourage smoking cessation f. PCCM on board  2. Bradycardia, resolved a. S/p transient loss of pulse with ROSC, currently off dopamine drip b. Per cardiology, continue current therapy  3. Change in urine with history of left hydronephrosis s/p left cystoureteroscopy, stent and tumor biopsy at [facility]  History of Left ureteral tumor a. Noted to have dark/cloudy urine on 7/8 and renal US was ordered which showed simple left renal cyst and mild bladder wall thickening posteriorly with a questionable small perinephric fluid collection adjacent to mid right kidney.  CT was ordered for further evaluation which showed left ureteral stent grossly good position without any significant hydronephrosis or obstruction and small nonobstructive renal calculi on the right without any other significant findings b. UA c. urology consult  4. AKI, resolved  5. Left subclavian DVT a. Heparin drip, can probably switch to PO tomorrow  6. Acute metabolic encephalopathy secondary to metabolic derangement a. resolved   DVT prophylaxis: SCDs Start: 09/15/19 1703 heparin drip   Family Communication: Patient's son has been updated   Disposition Plan: Urology consult, PT eval Status is: Inpatient  Remains inpatient appropriate because:Unsafe d/c plan and IV treatments appropriate due to intensity of illness or inability to take PO   Dispo: The patient is from: Home              Anticipated d/c is to: TBD              Anticipated d/c date is: 2 days  Patient currently is not medically stable to d/c.          Pressure injury documentation    None  Consultants  Urology Cardiology PCCM  Procedures   Intubated-> extubated  Antibiotics   Anti-infectives (From admission, onward)   Start     Dose/Rate Route Frequency Ordered Stop   09/19/19 1000  vancomycin  (VANCOCIN) IVPB 1000 mg/200 mL premix  Status:  Discontinued        1,000 mg 200 mL/hr over 60 Minutes Intravenous Every 24 hours 09/18/19 1228 09/19/19 0904   09/19/19 0700  vancomycin (VANCOREADY) IVPB 1250 mg/250 mL  Status:  Discontinued        1,250 mg 166.7 mL/hr over 90 Minutes Intravenous Every 24 hours 09/18/19 0701 09/18/19 1228   09/18/19 0700  vancomycin (VANCOREADY) IVPB 1500 mg/300 mL        1,500 mg 150 mL/hr over 120 Minutes Intravenous  Once 09/18/19 0652 09/18/19 1049   09/17/19 1155  Ampicillin-Sulbactam (UNASYN) 3 g in sodium chloride 0.9 % 100 mL IVPB     Discontinue     3 g 200 mL/hr over 30 Minutes Intravenous Every 6 hours 09/17/19 1056     09/15/19 1800  Ampicillin-Sulbactam (UNASYN) 3 g in sodium chloride 0.9 % 100 mL IVPB  Status:  Discontinued        3 g 200 mL/hr over 30 Minutes Intravenous Every 12 hours 09/15/19 1758 09/17/19 1056   09/15/19 1545  cefTRIAXone (ROCEPHIN) 1 g in sodium chloride 0.9 % 100 mL IVPB        1 g 200 mL/hr over 30 Minutes Intravenous  Once 09/15/19 1532 09/15/19 1617   09/15/19 1545  azithromycin (ZITHROMAX) 500 mg in sodium chloride 0.9 % 250 mL IVPB        500 mg 250 mL/hr over 60 Minutes Intravenous  Once 09/15/19 1532 09/15/19 1742        Subjective   Complaining of asymptomatic hematuria, otherwise no issues or overnight events  Objective   Vitals:   09/21/19 1150 09/21/19 1200 09/21/19 1300 09/21/19 1400  BP:  131/67 (!) 115/59 122/66  Pulse:  68 66 70  Resp:  (!) 27 (!) 23 16  Temp:  98.5 F (36.9 C)    TempSrc:  Oral    SpO2: 95% 94% 99% 95%  Weight:      Height:        Intake/Output Summary (Last 24 hours) at 09/21/2019 1539 Last data filed at 09/21/2019 1202 Gross per 24 hour  Intake 500.38 ml  Output 1050 ml  Net -549.62 ml   Filed Weights   09/19/19 0400 09/20/19 0441 09/21/19 0500  Weight: 67.3 kg 63.5 kg 63.2 kg    Examination:  Physical Exam Vitals and nursing note reviewed.    Constitutional:      Appearance: Normal appearance.  HENT:     Head: Normocephalic and atraumatic.  Eyes:     Conjunctiva/sclera: Conjunctivae normal.  Cardiovascular:     Rate and Rhythm: Normal rate and regular rhythm.  Pulmonary:     Effort: Pulmonary effort is normal.     Breath sounds: Normal breath sounds.     Comments: cough Abdominal:     General: Abdomen is flat.     Palpations: Abdomen is soft.  Genitourinary:    Comments: Dark urine with sediment Musculoskeletal:        General: No swelling or tenderness.  Skin:    Coloration: Skin is not jaundiced or  pale.  Neurological:     Mental Status: She is alert. Mental status is at baseline.  Psychiatric:        Mood and Affect: Mood normal.        Behavior: Behavior normal.     Data Reviewed: I have personally reviewed following labs and imaging studies  CBC: Recent Labs  Lab 09/15/19 1513 09/16/19 0450 09/17/19 0541 09/18/19 0500 09/19/19 0523 09/20/19 0826 09/21/19 0440  WBC 15.0*   < > 17.9* 21.0* 18.6* 14.0* 12.1*  NEUTROABS 10.5*  --  14.6*  --   --   --   --   HGB 10.7*   < > 8.2* 8.0* 8.9* 9.3* 9.4*  HCT 31.6*   < > 23.5* 23.5* 25.1* 26.7* 27.0*  MCV 96.9   < > 93.6 94.0 89.6 92.1 89.7  PLT 696*   < > 369 349 398 438* 456*   < > = values in this interval not displayed.   Basic Metabolic Panel: Recent Labs  Lab 09/16/19 0450 09/17/19 0541 09/17/19 0641 09/18/19 0500 09/19/19 0523 09/20/19 0826 09/21/19 0440  NA   < >  --  139 139 141 141 143  K   < >  --  4.6 3.7 3.4* 3.1* 4.2  CL   < >  --  100 99 103 97* 104  CO2   < >  --  28 32 27 28 28   GLUCOSE   < >  --  94 91 90 92 106*  BUN   < >  --  45* 32* 21 25* 25*  CREATININE   < >  --  1.30* 1.13* 0.95 1.06* 1.00  CALCIUM   < >  --  9.0 9.1 9.1 9.2 9.4  MG  --  1.9  --  1.8 2.0 2.1 2.1  PHOS   < >  --  4.4 3.0 2.2* 4.2 2.9   < > = values in this interval not displayed.   GFR: Estimated Creatinine Clearance: 45.5 mL/min (by C-G formula  based on SCr of 1 mg/dL). Liver Function Tests: Recent Labs  Lab 09/15/19 1513 09/15/19 1513 09/17/19 0641 09/18/19 0500 09/19/19 0523 09/20/19 0826 09/21/19 0440  AST 40  --   --  60*  --   --   --   ALT 40  --   --  116*  --   --   --   ALKPHOS 61  --   --  87  --   --   --   BILITOT 0.7  --   --  1.6*  --   --   --   PROT 6.8  --   --  5.9*  --   --   --   ALBUMIN 3.7   < > 2.9* 3.0* 2.9* 3.2* 3.1*   < > = values in this interval not displayed.   Recent Labs  Lab 09/15/19 1725  LIPASE 29  AMYLASE 81   No results for input(s): AMMONIA in the last 168 hours. Coagulation Profile: Recent Labs  Lab 09/15/19 2012  INR 1.1   Cardiac Enzymes: No results for input(s): CKTOTAL, CKMB, CKMBINDEX, TROPONINI in the last 168 hours. BNP (last 3 results) No results for input(s): PROBNP in the last 8760 hours. HbA1C: No results for input(s): HGBA1C in the last 72 hours. CBG: Recent Labs  Lab 09/20/19 2000 09/20/19 2348 09/21/19 0353 09/21/19 0716 09/21/19 1147  GLUCAP 140* 97 97 95 99   Lipid  Profile: No results for input(s): CHOL, HDL, LDLCALC, TRIG, CHOLHDL, LDLDIRECT in the last 72 hours. Thyroid Function Tests: No results for input(s): TSH, T4TOTAL, FREET4, T3FREE, THYROIDAB in the last 72 hours. Anemia Panel: No results for input(s): VITAMINB12, FOLATE, FERRITIN, TIBC, IRON, RETICCTPCT in the last 72 hours. Sepsis Labs: Recent Labs  Lab 09/15/19 1513 09/15/19 1725 09/16/19 0450 09/17/19 0541  PROCALCITON  --  0.15 0.56 0.38  LATICACIDVEN 1.3  --   --   --     Recent Results (from the past 240 hour(s))  Culture, blood (routine x 2)     Status: Abnormal   Collection Time: 09/15/19  3:13 PM   Specimen: BLOOD  Result Value Ref Range Status   Specimen Description   Final    BLOOD BLOOD LEFT HAND Performed at [facility], [address] [place], [place] [address]    Special Requests   Final    BOTTLES DRAWN AEROBIC AND ANAEROBIC Blood Culture  results may not be optimal due to an excessive volume of blood received in culture bottles Performed at [facility], [address], [place], [place] [address]    Culture  Setup Time   Final    GRAM POSITIVE COCCI ANAEROBIC BOTTLE ONLY CRITICAL RESULT CALLED TO, READ BACK BY AND VERIFIED WITH:  SHEEMA HALLAJI ON 09/16/2019 AT 1214 TIK    Culture (A)  Final    STAPHYLOCOCCUS SPECIES (COAGULASE NEGATIVE) THE SIGNIFICANCE OF ISOLATING THIS ORGANISM FROM A SINGLE SET OF BLOOD CULTURES WHEN MULTIPLE SETS ARE DRAWN IS UNCERTAIN. PLEASE NOTIFY THE MICROBIOLOGY DEPARTMENT WITHIN ONE WEEK IF SPECIATION AND SENSITIVITIES ARE REQUIRED. Performed at [facility], [place] [address], [place], [place] [address]    Report Status 09/19/2019 FINAL  Final  Blood Culture ID Panel (Reflexed)     Status: Abnormal   Collection Time: 09/15/19  3:13 PM  Result Value Ref Range Status   Enterococcus species NOT DETECTED NOT DETECTED Final   Listeria monocytogenes NOT DETECTED NOT DETECTED Final   Staphylococcus species DETECTED (A) NOT DETECTED Final    Comment: Methicillin (oxacillin) susceptible coagulase negative staphylococcus. Possible blood culture contaminant (unless isolated from more than one blood culture draw or clinical case suggests pathogenicity). No antibiotic treatment is indicated for blood  culture contaminants. CRITICAL RESULT CALLED TO, READ BACK BY AND VERIFIED WITH: SHEEMA HALLAJI ON 09/16/2019 AT 1214 TIK    Staphylococcus aureus (BCID) NOT DETECTED NOT DETECTED Final   Methicillin resistance NOT DETECTED NOT DETECTED Final   Streptococcus species NOT DETECTED NOT DETECTED Final   Streptococcus agalactiae NOT DETECTED NOT DETECTED Final   Streptococcus pneumoniae NOT DETECTED NOT DETECTED Final   Streptococcus pyogenes NOT DETECTED NOT DETECTED Final   Acinetobacter baumannii NOT DETECTED NOT DETECTED Final   Enterobacteriaceae species NOT DETECTED NOT DETECTED Final    Enterobacter cloacae complex NOT DETECTED NOT DETECTED Final   Escherichia coli NOT DETECTED NOT DETECTED Final   Klebsiella oxytoca NOT DETECTED NOT DETECTED Final   Klebsiella pneumoniae NOT DETECTED NOT DETECTED Final   Proteus species NOT DETECTED NOT DETECTED Final   Serratia marcescens NOT DETECTED NOT DETECTED Final   Haemophilus influenzae NOT DETECTED NOT DETECTED Final   Neisseria meningitidis NOT DETECTED NOT DETECTED Final   Pseudomonas aeruginosa NOT DETECTED NOT DETECTED Final   Candida albicans NOT DETECTED NOT DETECTED Final   Candida glabrata NOT DETECTED NOT DETECTED Final   Candida krusei NOT DETECTED NOT DETECTED Final   Candida parapsilosis  NOT DETECTED NOT DETECTED Final   Candida tropicalis NOT DETECTED NOT DETECTED Final    Comment: Performed at [facility], [address] [place], [place] [address]  Culture, blood (routine x 2)     Status: None   Collection Time: 09/15/19  3:50 PM   Specimen: BLOOD  Result Value Ref Range Status   Specimen Description BLOOD LEFT ARM  Final   Special Requests   Final    BOTTLES DRAWN AEROBIC AND ANAEROBIC Blood Culture adequate volume   Culture   Final    NO GROWTH 5 DAYS Performed at [facility], [address] [place], [place] [address]    Report Status 09/20/2019 FINAL  Final  SARS Coronavirus 2 by RT PCR (hospital order, performed in [facility] hospital lab) Nasopharyngeal Nasopharyngeal Swab     Status: None   Collection Time: 09/15/19  3:55 PM   Specimen: Nasopharyngeal Swab  Result Value Ref Range Status   SARS Coronavirus 2 NEGATIVE NEGATIVE Final    Comment: (NOTE) SARS-CoV-2 target nucleic acids are NOT DETECTED.  The SARS-CoV-2 RNA is generally detectable in upper and lower respiratory specimens during the acute phase of infection. The lowest concentration of SARS-CoV-2 viral copies this assay can detect is 250 copies / mL. A negative result does not preclude SARS-CoV-2 infection and  should not be used as the sole basis for treatment or other patient management decisions.  A negative result may occur with improper specimen collection / handling, submission of specimen other than nasopharyngeal swab, presence of viral mutation(s) within the areas targeted by this assay, and inadequate number of viral copies (<250 copies / mL). A negative result must be combined with clinical observations, patient history, and epidemiological information.  Fact Sheet for Patients:   StrictlyIdeas.no  Fact Sheet for Healthcare Providers: BankingDealers.co.za  This test is not yet approved or  cleared by the [place] FDA and has been authorized for detection and/or diagnosis of SARS-CoV-2 by FDA under an Emergency Use Authorization (EUA).  This EUA will remain in effect (meaning this test can be used) for the duration of the COVID-19 declaration under Section 564(b)(1) of the Act, 21 U.S.C. section 360bbb-3(b)(1), unless the authorization is terminated or revoked sooner.  Performed at [facility], [address], [place], [place] [address]   Urine Culture     Status: None   Collection Time: 09/15/19  6:11 PM   Specimen: Urine, Random  Result Value Ref Range Status   Specimen Description   Final    URINE, RANDOM Performed at [facility], [address], [place], [place] [address]    Special Requests   Final    NONE Performed at [facility], [address], [place], [place] [address]    Culture   Final    NO GROWTH Performed at [facility], [place] [address], [place], [place] [address]    Report Status 09/17/2019 FINAL  Final  MRSA PCR Screening     Status: None   Collection Time: 09/15/19  6:51 PM   Specimen: Nasal Mucosa; Nasopharyngeal  Result Value Ref Range Status   MRSA by PCR NEGATIVE NEGATIVE Final    Comment:        The GeneXpert MRSA Assay (FDA approved for NASAL  specimens only), is one component of a comprehensive MRSA colonization surveillance program. It is not intended to diagnose MRSA infection nor to guide or monitor treatment for MRSA infections. Performed at [facility], [place], [place] [address]   CULTURE, BLOOD (ROUTINE X 2) w Reflex to ID Panel     Status: None (Preliminary result)   Collection Time: 09/18/19  9:13 AM   Specimen: BLOOD  Result Value Ref Range Status   Specimen Description BLOOD BLOOD RIGHT HAND  Final   Special Requests   Final    BOTTLES DRAWN AEROBIC AND ANAEROBIC Blood Culture adequate volume   Culture   Final    NO GROWTH 3 DAYS Performed at [facility], [address] [place], [place] [address]    Report Status PENDING  Incomplete  CULTURE, BLOOD (ROUTINE X 2) w Reflex to ID Panel     Status: None (Preliminary result)   Collection Time: 09/18/19  9:45 AM   Specimen: BLOOD  Result Value Ref Range Status   Specimen Description BLOOD BLOOD RIGHT FOREARM  Final   Special Requests   Final    BOTTLES DRAWN AEROBIC AND ANAEROBIC Blood Culture adequate volume   Culture   Final    NO GROWTH 3 DAYS Performed at [facility], [address], [place], [place] [address]    Report Status PENDING  Incomplete         Radiology Studies: CT ABDOMEN PELVIS W CONTRAST  Result Date: 09/21/2019 CLINICAL DATA:  Abdominal abscess or infection. EXAM: CT ABDOMEN AND PELVIS WITH CONTRAST TECHNIQUE: Multidetector CT imaging of the abdomen and pelvis was performed using the standard protocol following bolus administration of intravenous contrast. CONTRAST:  142mL OMNIPAQUE IOHEXOL 300 MG/ML  SOLN COMPARISON:  None. FINDINGS: Lower chest: Small bilateral pleural effusions are noted with adjacent atelectasis of both lower lobes. Hepatobiliary: No focal liver abnormality is seen. No gallstones, gallbladder wall thickening, or biliary dilatation. Pancreas: Unremarkable. No pancreatic  ductal dilatation or surrounding inflammatory changes. Spleen: Normal in size without focal abnormality. Adrenals/Urinary Tract: Adrenal glands appear normal. Small nonobstructive right renal calculi are noted. Left ureteral stent is noted in grossly good position. No significant hydronephrosis or renal obstruction is noted. Urinary bladder is unremarkable. Stomach/Bowel: The stomach appears normal. There is no evidence of bowel obstruction or inflammation. The appendix is not visualized, but no inflammation is noted in the right lower quadrant. Vascular/Lymphatic: Aortic atherosclerosis. No enlarged abdominal or pelvic lymph nodes. Reproductive: Status post hysterectomy. No adnexal masses. Other: Small fat containing periumbilical hernia is noted. No ascites is noted. Musculoskeletal: No acute or significant osseous findings. IMPRESSION: 1. Small bilateral pleural effusions are noted with adjacent atelectasis of both lower lobes. 2. Left ureteral stent is noted in grossly good position. No significant hydronephrosis or renal obstruction is noted. 3. Small nonobstructive right renal calculi. 4. Small fat containing periumbilical hernia. Aortic Atherosclerosis (ICD10-I70.0). Electronically Signed   By: Marijo Conception M.D.   On: 09/21/2019 13:15   US RENAL  Result Date: 09/21/2019 CLINICAL DATA:  Recent cystoscopy with biopsy of ureteral tumor and RIGHT stent placement, now with dark urine and containing sediment EXAM: RENAL / URINARY TRACT ULTRASOUND COMPLETE COMPARISON:  Renal ultrasound 09/16/2019 FINDINGS: Right Kidney: Renal measurements: 9.9 x 4.3 x 5.1 cm = volume: 114 mL. Normal cortical thickness. Upper normal cortical echogenicity. Small cortical scar upper pole RIGHT kidney. Question exophytic mass versus perinephric collection RIGHT kidney 8 x 12 x 30 mm at mid kidney. No additional mass or hydronephrosis. Left Kidney: Renal measurements: 10.2 x 5.1 x 4.9 cm = volume: 132 mL. Normal cortical thickness  and echogenicity. Small cyst at upper  pole 16 x 17 x 11 mm, simple features. No additional mass, hydronephrosis, or shadowing calcification. Bladder: Normally distended. BILATERAL ureteral jets visualized. Questionable wall thickening of the posterior inferior bladder wall versus edema related to recent procedure, recommend correlation with preceding cystoscopy findings. Other: Incidentally noted small LEFT pleural effusion. IMPRESSION: Small pleural effusion. Small simple LEFT renal cyst 17 mm greatest size. Question mild bladder wall thickening inferior posteriorly, recommend correlation with cystoscopy findings. Questionable small perinephric fluid collection versus complicated cystic lesion adjacent to mid RIGHT kidney; further assessment by CT imaging with contrast recommended. Electronically Signed   By: Lavonia Dana M.D.   On: 09/21/2019 11:24        Scheduled Meds: . acidophilus  1 capsule Oral Daily  . aspirin EC  81 mg Oral Daily  . Chlorhexidine Gluconate Cloth  6 each Topical Daily  . dicyclomine  10 mg Oral Once  . insulin aspart  0-9 Units Subcutaneous Q4H  . ipratropium-albuterol  3 mL Nebulization Q4H  . pantoprazole  40 mg Oral Daily   Continuous Infusions: . sodium chloride Stopped (09/18/19 0815)  . ampicillin-sulbactam (UNASYN) IV 3 g (09/21/19 1210)  . heparin 1,150 Units/hr (09/21/19 0700)     Time spent: 40 minutes with over 50% of the time coordinating the patient's care    Harold Hedge, DO [facility] Pager [phone]  Call night coverage person covering after 7pm

## 2019-09-21 NOTE — Progress Notes (Signed)
[facility]    SUBJECTIVE: Patient finally extubated feels reasonably well getting chest PT feels reasonably well denies any pain no fever chills or sweats sitting up in bed states she feels fine   Vitals:   09/21/19 1000 09/21/19 1100 09/21/19 1150 09/21/19 1200  BP: 137/64 (!) 144/70  131/67  Pulse: 67 69  68  Resp: (!) 21 (!) 27  (!) 27  Temp:    98.5 F (36.9 C)  TempSrc:    Oral  SpO2: 95% 98% 95% 94%  Weight:      Height:         Intake/Output Summary (Last 24 hours) at 09/21/2019 1411 Last data filed at 09/21/2019 1202 Gross per 24 hour  Intake 500.38 ml  Output 1050 ml  Net -549.62 ml      PHYSICAL EXAM  General: Well developed, well nourished, in no acute distress HEENT:  Normocephalic and atramatic Neck:  No JVD.  Lungs: Clear bilaterally to auscultation and percussion. Heart: HRRR . Normal S1 and S2 without gallops or murmurs.  Abdomen: Bowel sounds are positive, abdomen soft and non-tender  Msk:  Back normal, normal gait. Normal strength and tone for age. Extremities: No clubbing, cyanosis or edema.   Neuro: Alert and oriented X 3. Psych:  Good affect, responds appropriately   LABS: Basic Metabolic Panel: Recent Labs    09/20/19 0826 09/21/19 0440  NA 141 143  K 3.1* 4.2  CL 97* 104  CO2 28 28  GLUCOSE 92 106*  BUN 25* 25*  CREATININE 1.06* 1.00  CALCIUM 9.2 9.4  MG 2.1 2.1  PHOS 4.2 2.9   Liver Function Tests: Recent Labs    09/20/19 0826 09/21/19 0440  ALBUMIN 3.2* 3.1*   No results for input(s): LIPASE, AMYLASE in the last 72 hours. CBC: Recent Labs    09/20/19 0826 09/21/19 0440  WBC 14.0* 12.1*  HGB 9.3* 9.4*  HCT 26.7* 27.0*  MCV 92.1 89.7  PLT 438* 456*   Cardiac Enzymes: No results for input(s): CKTOTAL, CKMB, CKMBINDEX, TROPONINI in the last 72 hours. BNP: Invalid input(s): POCBNP D-Dimer: No results for input(s): DDIMER in the last 72 hours. Hemoglobin A1C: No results for input(s): HGBA1C in the last 72  hours. Fasting Lipid Panel: No results for input(s): CHOL, HDL, LDLCALC, TRIG, CHOLHDL, LDLDIRECT in the last 72 hours. Thyroid Function Tests: No results for input(s): TSH, T4TOTAL, T3FREE, THYROIDAB in the last 72 hours.  Invalid input(s): FREET3 Anemia Panel: No results for input(s): VITAMINB12, FOLATE, FERRITIN, TIBC, IRON, RETICCTPCT in the last 72 hours.  CT ABDOMEN PELVIS W CONTRAST  Result Date: 09/21/2019 CLINICAL DATA:  Abdominal abscess or infection. EXAM: CT ABDOMEN AND PELVIS WITH CONTRAST TECHNIQUE: Multidetector CT imaging of the abdomen and pelvis was performed using the standard protocol following bolus administration of intravenous contrast. CONTRAST:  154mL OMNIPAQUE IOHEXOL 300 MG/ML  SOLN COMPARISON:  None. FINDINGS: Lower chest: Small bilateral pleural effusions are noted with adjacent atelectasis of both lower lobes. Hepatobiliary: No focal liver abnormality is seen. No gallstones, gallbladder wall thickening, or biliary dilatation. Pancreas: Unremarkable. No pancreatic ductal dilatation or surrounding inflammatory changes. Spleen: Normal in size without focal abnormality. Adrenals/Urinary Tract: Adrenal glands appear normal. Small nonobstructive right renal calculi are noted. Left ureteral stent is noted in grossly good position. No significant hydronephrosis or renal obstruction is noted. Urinary bladder is unremarkable. Stomach/Bowel: The stomach appears normal. There is no evidence of bowel obstruction or inflammation. The appendix is not visualized, but no  inflammation is noted in the right lower quadrant. Vascular/Lymphatic: Aortic atherosclerosis. No enlarged abdominal or pelvic lymph nodes. Reproductive: Status post hysterectomy. No adnexal masses. Other: Small fat containing periumbilical hernia is noted. No ascites is noted. Musculoskeletal: No acute or significant osseous findings. IMPRESSION: 1. Small bilateral pleural effusions are noted with adjacent atelectasis of  both lower lobes. 2. Left ureteral stent is noted in grossly good position. No significant hydronephrosis or renal obstruction is noted. 3. Small nonobstructive right renal calculi. 4. Small fat containing periumbilical hernia. Aortic Atherosclerosis (ICD10-I70.0). Electronically Signed   By: Marijo Conception M.D.   On: 09/21/2019 13:15   US RENAL  Result Date: 09/21/2019 CLINICAL DATA:  Recent cystoscopy with biopsy of ureteral tumor and RIGHT stent placement, now with dark urine and containing sediment EXAM: RENAL / URINARY TRACT ULTRASOUND COMPLETE COMPARISON:  Renal ultrasound 09/16/2019 FINDINGS: Right Kidney: Renal measurements: 9.9 x 4.3 x 5.1 cm = volume: 114 mL. Normal cortical thickness. Upper normal cortical echogenicity. Small cortical scar upper pole RIGHT kidney. Question exophytic mass versus perinephric collection RIGHT kidney 8 x 12 x 30 mm at mid kidney. No additional mass or hydronephrosis. Left Kidney: Renal measurements: 10.2 x 5.1 x 4.9 cm = volume: 132 mL. Normal cortical thickness and echogenicity. Small cyst at upper pole 16 x 17 x 11 mm, simple features. No additional mass, hydronephrosis, or shadowing calcification. Bladder: Normally distended. BILATERAL ureteral jets visualized. Questionable wall thickening of the posterior inferior bladder wall versus edema related to recent procedure, recommend correlation with preceding cystoscopy findings. Other: Incidentally noted small LEFT pleural effusion. IMPRESSION: Small pleural effusion. Small simple LEFT renal cyst 17 mm greatest size. Question mild bladder wall thickening inferior posteriorly, recommend correlation with cystoscopy findings. Questionable small perinephric fluid collection versus complicated cystic lesion adjacent to mid RIGHT kidney; further assessment by CT imaging with contrast recommended. Electronically Signed   By: Lavonia Dana M.D.   On: 09/21/2019 11:24     Echo preserved left ventricular function EF of  60%  TELEMETRY: Normal sinus rhythm nonspecific ST-T wave changes:  ASSESSMENT AND PLAN:  Active Problems:   Acute on chronic respiratory failure with hypoxia (HCC) Status post extubation Recent episode of sepsis Improved for metabolic acidosis Bradycardia Altered mental status   Plan Continue supportive care Maintain pulmonary chest PT Consider increase activity out of bed to chair Continue antibiotic therapy for sepsis No definitive therapy necessary for heart rate Encephalopathy and mental status improved continue conservative management Patient is having jerking sensation in her arm will consider neurology or EEG Continue conservative cardiac involvement   Yolonda Kida, MD 09/21/2019 2:11 PM

## 2019-09-21 NOTE — Progress Notes (Signed)
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  SIGNIFICANT EVENTS  7/2-Presented to ED 7/2-Transient loss of pulse and hypoxia on the vent requiring manual bag ventilation and CPR with ROSC 7/2-Started on Dopamine Gtt 7/3-Remains intubated and sedated 7/3-Seizure like activity noted overnight started on Keppra 7/4-Remain intubated and mechanically ventillated 09/18/19- no overnight events, weaning precedex drip, optimizing for downgrade from ICU to [facility] 09/19/19- removal HD cath, removal foley, d/cd precedex , optimizing for TRH.  09/20/19- patient had episode of chest discomfort today, EKG done reviewed without concerning findings, troponin pending. TRH transfer.  09/21/19- no overnight events in SDU, plan for moving patient to medical floor.   STUDIES:  7/2- CXR>>interval development of diffuse left lung opacity concerning for atelectasis or pneumonia with some degree of volume loss causing mild right to left mediastinal shift 7/2- CT head>> No acute intracranial abnormality 7/3- US Renal>> Pending 7/3-US Venous LUE>> 7/3- EEG>>Shows no evidence of seizure activity 7/4-Chest xray show no evidence of acute cardiopulmonary disease  CULTURES: SARS-CoV-2 PCR 7/2>> Negative MRSA PCR  7/2>>Negative Blood culture 7/2>BCx 1 of 4 GPC. BCID: staph species.  Urine culture 7/2>No growth Strep Pneumo Urinary antigen 7/2>> Negative Legionella Pneumo Ur Ag 7/2> Pending  ANTIBIOTICS: Unasyn 7/2>>  PAST MEDICAL HISTORY :   has a past medical history of Anxiety, Asthma, COPD (chronic obstructive pulmonary disease) ([place]), Depression, GERD (gastroesophageal reflux disease), and Hypertension.  has a past surgical history that includes Tubal ligation; Total hip arthroplasty (Left); and Abdominal hysterectomy.   Prior to Admission medications   Medication Sig Start Date End Date Taking? Authorizing Provider  acetaminophen (TYLENOL) 500 MG tablet Take 500-1,000 mg by mouth every 6 (six) hours as needed for moderate pain or fever.     [provider]  albuterol (ACCUNEB) 1.25 MG/3ML nebulizer solution Take 1 ampule by nebulization every 6 (six) hours as needed for wheezing or shortness of breath.    [provider]  albuterol (PROVENTIL HFA;VENTOLIN HFA) 108 (90 Base) MCG/ACT inhaler Inhale 2 puffs into the lungs every 6 (six) hours as needed for wheezing or shortness of breath.    [provider]  amLODipine (NORVASC) 10 MG tablet Take 10 mg by mouth daily.    [provider]  aspirin EC 81 MG EC tablet Take 1 tablet (81 mg total) by mouth daily. 03/27/16   Hillary Bow, MD  carvedilol (COREG) 12.5 MG tablet Take 1 tablet (12.5 mg total) by mouth 2 (two) times daily with a meal. 11/23/17   Mody, Sital, MD  Fluticasone-Salmeterol (ADVAIR) 250-50 MCG/DOSE AEPB Inhale 1 puff into the lungs every 12 (twelve) hours.     [provider]  ipratropium (ATROVENT HFA) 17 MCG/ACT inhaler Inhale 2 puffs into the lungs 3 (three) times daily.     [provider]  lisinopril (PRINIVIL,ZESTRIL) 40 MG tablet Take 40 mg by mouth daily.  [provider]  nicotine (NICODERM CQ - DOSED IN MG/24 HOURS) 21 mg/24hr patch Place 1 patch (21 mg total)  onto the skin daily. 11/23/17   Bettey Costa, MD  omeprazole (PRILOSEC) 20 MG capsule Take 20 mg by mouth daily.    [provider]  pravastatin (PRAVACHOL) 20 MG tablet Take 20 mg by mouth every evening.     [provider]  ranitidine (ZANTAC) 150 MG capsule Take 150 mg by mouth 2 (two) times daily.    [provider]   Scheduled Meds: . acidophilus  1 capsule Oral Daily  . aspirin EC  81 mg Oral Daily  . Chlorhexidine Gluconate Cloth  6 each Topical Daily  . dicyclomine  10 mg Oral Once  . insulin aspart  0-9 Units Subcutaneous Q4H  . ipratropium-albuterol  3 mL Nebulization Q4H  . pantoprazole  40 mg Oral Daily   Continuous Infusions: . sodium chloride Stopped (09/18/19 0815)  . ampicillin-sulbactam (UNASYN) IV Stopped (09/21/19 8341)  . heparin 1,150 Units/hr (09/21/19 0700)   PRN Meds:.sodium chloride, acetaminophen, acetaminophen, hydrALAZINE, ondansetron (ZOFRAN) IV   Allergies  Allergen Reactions  . Aspirin Other (See Comments)    GI ulcers  . Morphine And Related Other (See Comments)    Delerium, itching  . Nsaids Other (See Comments)    GI ulcers    FAMILY HISTORY:  family history includes Heart disease in her father and sister. SOCIAL HISTORY:  reports that she has been smoking. She has never used smokeless tobacco. She reports that she does not drink alcohol and does not use drugs.  REVIEW OF SYSTEMS:   UNABLE TO ASSESS DUE TO INTUBATION AND SEDATED  VITAL SIGNS: Temp:  [97.6 F (36.4 C)-99.4 F (37.4 C)] 97.6 F (36.4 C) (07/08 0800) Pulse Rate:  [62-89] 67 (07/08 1000) Resp:  [16-29] 21 (07/08 1000) BP: (95-137)/(53-84) 137/64 (07/08 1000) SpO2:  [92 %-100 %] 95 % (07/08 1000) FiO2 (%):  [35 %] 35 % (07/07 1205) Weight:  [63.2 kg] 63.2 kg (07/08 0500)  PHYSICAL EXAMINATION: GENERAL: 74 year old patient lying in the bed on intubated and mechanically ventillated EYES:  No scleral icterus. Extraocular muscles intact.  HEENT:  Head atraumatic, normocephalic. Oropharynx and nasopharynx clear.  NECK:  Supple, no jugular venous distention. No thyroid enlargement, no tenderness.  LUNGS: Decreased breath sounds bilaterally, no wheezing, rales,rhonchi or crepitation. No use of accessory muscles of respiration.  CARDIOVASCULAR: S1, S2 normal. No murmurs, rubs, or gallops.  ABDOMEN: Soft, nontender, nondistended. Bowel sounds present. No organomegaly or mass.  EXTREMITIES: Generalized dependent edema, cyanosis, or clubbing.   NEUROLOGIC: confusion is iimproved  PSYCHIATRIC: Unable to assess.  SKIN: No obvious rash, lesion, or ulcer.   Recent Labs  Lab 09/19/19 0523 09/20/19 0826 09/21/19 0440  NA 141 141 143  K 3.4* 3.1* 4.2  CL 103 97* 104  CO2 27 28 28   BUN 21 25* 25*  CREATININE 0.95 1.06* 1.00  GLUCOSE 90 92 106*   Recent Labs  Lab 09/19/19 0523 09/20/19 0826 09/21/19 0440  HGB 8.9* 9.3* 9.4*  HCT 25.1* 26.7* 27.0*  WBC 18.6* 14.0* 12.1*  PLT 398 438* 456*   No results found.  Date: 09/15/2019 EKG Time: 14:36 Rate: 74 Rhythm: 2nd degree heart block vs 1st degree block with PAC's Axis: Normal Intervals:none ST&T Change: peaked T waves   ASSESSMENT / PLAN:   Acute Hypoxic  Respiratory Failure secondary to Aspiration Pneumonia? COPD without evidence of acute exacerbation -Weaned off sedation -  fever curve normalized  -CXR reviewd by me today - no inifltrate noted -Continue Unasyn 7d total -As needed bronchodilators -Encourage smoking cessation   Bradycardia -resolved S/p transient loss of pulse with ROSC -EKG shows questionable Junctional rhythm/slow afib -Now ff Dopamine gtt - electrolyte replacement -Cardiology input appreciated  Acute Metabolic Encephalopathy - secondary to severe metabolic derangement - resolved -Continue to treat underlying medical issues -CT head 7/3  negative for acute intracranial abnormality  Myoclonic Jerking - resolved -EEG shows no evidence of  seizure activity -Ativan PRN for seizures -Continue Seizure precaution -Discontinue Keppra -Neurology input appreciated  Acute Kidney Injury Severe Hyperkalemia on presentation >7.5 now improved 4.6 this am -Monitor I&O's / urinary output -Follow BMP -Ensure adequate renal perfusion -Avoid nephrotoxic agents as able -Replace electrolytes as indicated -Nephrology input appreciated  Left Ureteral tumor Hx: Left hydronephrosis s/p  LEFT Cystourethroscopy, with Ureteroscopy And/Or Pyeloscopy; 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    09/21/2019, 11:26 AM    Ottie Glazier, M.D.  [facility] [place]

## 2019-09-21 NOTE — Consult Note (Signed)
[place] for Electrolyte Monitoring and Replacement   Recent Labs: Potassium (mmol/L)  Date Value  09/21/2019 4.2   Magnesium (mg/dL)  Date Value  09/21/2019 2.1   Calcium (mg/dL)  Date Value  09/21/2019 9.4   Albumin (g/dL)  Date Value  09/21/2019 3.1 (L)   Phosphorus (mg/dL)  Date Value  09/21/2019 2.9   Sodium (mmol/L)  Date Value  09/21/2019 143    Assessment: 74 y.o.femalewith PMH significant for COPD, Asthma, GERD, MGUS, Hydronephrosis of left Kidney, HLD, Anxiety and Depression, Thrombocytosis, Bipolar disorder, Thyroid disease, tobacco abuse, bleeding disorder,hx of gross hematuria and left hydronephrosis, s/p left ureteroscopy 08/10/19  Goal of Therapy:  Electrolytes WNL  Plan:   No electrolyte replacement indicated today  Re-check electrolytes in am 7/9  Dallie Piles ,PharmD Clinical Pharmacist 09/21/2019 7:10 AM

## 2019-09-21 NOTE — Progress Notes (Addendum)
[facility]    SUBJECTIVE: Patient sitting up in bed feels reasonably well she is extubated alert oriented talking she has no real complaints at this point no shortness of breath no coughing no fever chills or sweats   Vitals:   09/21/19 1000 09/21/19 1100 09/21/19 1150 09/21/19 1200  BP: 137/64 (!) 144/70  131/67  Pulse: 67 69  68  Resp: (!) 21 (!) 27  (!) 27  Temp:    98.5 F (36.9 C)  TempSrc:    Oral  SpO2: 95% 98% 95% 94%  Weight:      Height:         Intake/Output Summary (Last 24 hours) at 09/21/2019 1400 Last data filed at 09/21/2019 1202 Gross per 24 hour  Intake 500.38 ml  Output 1050 ml  Net -549.62 ml      PHYSICAL EXAM  General: Well developed, well nourished, in no acute distress HEENT:  Normocephalic and atramatic Neck:  No JVD.  Lungs: Clear bilaterally to auscultation and percussion. Heart: HRRR . Normal S1 and S2 without gallops or murmurs.  Abdomen: Bowel sounds are positive, abdomen soft and non-tender  Msk:  Back normal, normal gait. Normal strength and tone for age. Extremities: No clubbing, cyanosis or edema.   Neuro: Alert and oriented X 3. Psych:  Good affect, responds appropriately   LABS: Basic Metabolic Panel: Recent Labs    09/20/19 0826 09/21/19 0440  NA 141 143  K 3.1* 4.2  CL 97* 104  CO2 28 28  GLUCOSE 92 106*  BUN 25* 25*  CREATININE 1.06* 1.00  CALCIUM 9.2 9.4  MG 2.1 2.1  PHOS 4.2 2.9   Liver Function Tests: Recent Labs    09/20/19 0826 09/21/19 0440  ALBUMIN 3.2* 3.1*   No results for input(s): LIPASE, AMYLASE in the last 72 hours. CBC: Recent Labs    09/20/19 0826 09/21/19 0440  WBC 14.0* 12.1*  HGB 9.3* 9.4*  HCT 26.7* 27.0*  MCV 92.1 89.7  PLT 438* 456*   Cardiac Enzymes: No results for input(s): CKTOTAL, CKMB, CKMBINDEX, TROPONINI in the last 72 hours. BNP: Invalid input(s): POCBNP D-Dimer: No results for input(s): DDIMER in the last 72 hours. Hemoglobin A1C: No results for input(s): HGBA1C  in the last 72 hours. Fasting Lipid Panel: No results for input(s): CHOL, HDL, LDLCALC, TRIG, CHOLHDL, LDLDIRECT in the last 72 hours. Thyroid Function Tests: No results for input(s): TSH, T4TOTAL, T3FREE, THYROIDAB in the last 72 hours.  Invalid input(s): FREET3 Anemia Panel: No results for input(s): VITAMINB12, FOLATE, FERRITIN, TIBC, IRON, RETICCTPCT in the last 72 hours.  CT ABDOMEN PELVIS W CONTRAST  Result Date: 09/21/2019 CLINICAL DATA:  Abdominal abscess or infection. EXAM: CT ABDOMEN AND PELVIS WITH CONTRAST TECHNIQUE: Multidetector CT imaging of the abdomen and pelvis was performed using the standard protocol following bolus administration of intravenous contrast. CONTRAST:  175mL OMNIPAQUE IOHEXOL 300 MG/ML  SOLN COMPARISON:  None. FINDINGS: Lower chest: Small bilateral pleural effusions are noted with adjacent atelectasis of both lower lobes. Hepatobiliary: No focal liver abnormality is seen. No gallstones, gallbladder wall thickening, or biliary dilatation. Pancreas: Unremarkable. No pancreatic ductal dilatation or surrounding inflammatory changes. Spleen: Normal in size without focal abnormality. Adrenals/Urinary Tract: Adrenal glands appear normal. Small nonobstructive right renal calculi are noted. Left ureteral stent is noted in grossly good position. No significant hydronephrosis or renal obstruction is noted. Urinary bladder is unremarkable. Stomach/Bowel: The stomach appears normal. There is no evidence of bowel obstruction or inflammation. The appendix  is not visualized, but no inflammation is noted in the right lower quadrant. Vascular/Lymphatic: Aortic atherosclerosis. No enlarged abdominal or pelvic lymph nodes. Reproductive: Status post hysterectomy. No adnexal masses. Other: Small fat containing periumbilical hernia is noted. No ascites is noted. Musculoskeletal: No acute or significant osseous findings. IMPRESSION: 1. Small bilateral pleural effusions are noted with adjacent  atelectasis of both lower lobes. 2. Left ureteral stent is noted in grossly good position. No significant hydronephrosis or renal obstruction is noted. 3. Small nonobstructive right renal calculi. 4. Small fat containing periumbilical hernia. Aortic Atherosclerosis (ICD10-I70.0). Electronically Signed   By: Marijo Conception M.D.   On: 09/21/2019 13:15   US RENAL  Result Date: 09/21/2019 CLINICAL DATA:  Recent cystoscopy with biopsy of ureteral tumor and RIGHT stent placement, now with dark urine and containing sediment EXAM: RENAL / URINARY TRACT ULTRASOUND COMPLETE COMPARISON:  Renal ultrasound 09/16/2019 FINDINGS: Right Kidney: Renal measurements: 9.9 x 4.3 x 5.1 cm = volume: 114 mL. Normal cortical thickness. Upper normal cortical echogenicity. Small cortical scar upper pole RIGHT kidney. Question exophytic mass versus perinephric collection RIGHT kidney 8 x 12 x 30 mm at mid kidney. No additional mass or hydronephrosis. Left Kidney: Renal measurements: 10.2 x 5.1 x 4.9 cm = volume: 132 mL. Normal cortical thickness and echogenicity. Small cyst at upper pole 16 x 17 x 11 mm, simple features. No additional mass, hydronephrosis, or shadowing calcification. Bladder: Normally distended. BILATERAL ureteral jets visualized. Questionable wall thickening of the posterior inferior bladder wall versus edema related to recent procedure, recommend correlation with preceding cystoscopy findings. Other: Incidentally noted small LEFT pleural effusion. IMPRESSION: Small pleural effusion. Small simple LEFT renal cyst 17 mm greatest size. Question mild bladder wall thickening inferior posteriorly, recommend correlation with cystoscopy findings. Questionable small perinephric fluid collection versus complicated cystic lesion adjacent to mid RIGHT kidney; further assessment by CT imaging with contrast recommended. Electronically Signed   By: Lavonia Dana M.D.   On: 09/21/2019 11:24     Echo preserved left ventricular  function  TELEMETRY: Normal sinus rhythm nonspecific T changes:  ASSESSMENT AND PLAN:  Active Problems:   Acute on chronic respiratory failure with hypoxia (HCC) Respiratory failure History of sepsis History of metabolic acidosis Status post ventilation mechanically History of urethral trauma Bradycardia Mild hypotension  Plan Continue current therapy Hydration for renal insufficiency Patient extubated continue pulmonary toilet Supplemental oxygen as necessary Consider transferring to progressive care Antibiotic therapy as necessary Agree with EEG for monoclonal jerking   Yolonda Kida, MD  09/21/2019 2:00 PM

## 2019-09-22 ENCOUNTER — Other Ambulatory Visit: Payer: Self-pay

## 2019-09-22 DIAGNOSIS — F419 Anxiety disorder, unspecified: Secondary | ICD-10-CM

## 2019-09-22 LAB — GLUCOSE, CAPILLARY
Glucose-Capillary: 118 mg/dL — ABNORMAL HIGH (ref 70–99)
Glucose-Capillary: 122 mg/dL — ABNORMAL HIGH (ref 70–99)
Glucose-Capillary: 78 mg/dL (ref 70–99)
Glucose-Capillary: 94 mg/dL (ref 70–99)
Glucose-Capillary: 98 mg/dL (ref 70–99)
Glucose-Capillary: 99 mg/dL (ref 70–99)

## 2019-09-22 LAB — BASIC METABOLIC PANEL
Anion gap: 10 (ref 5–15)
BUN: 21 mg/dL (ref 8–23)
CO2: 25 mmol/L (ref 22–32)
Calcium: 9.5 mg/dL (ref 8.9–10.3)
Chloride: 107 mmol/L (ref 98–111)
Creatinine, Ser: 0.86 mg/dL (ref 0.44–1.00)
GFR calc Af Amer: >60 mL/min (ref >60)
GFR calc non Af Amer: >60 mL/min (ref >60)
Glucose, Bld: 94 mg/dL (ref 70–99)
Potassium: 3.6 mmol/L (ref 3.5–5.1)
Sodium: 142 mmol/L (ref 135–145)

## 2019-09-22 LAB — CBC
HCT: 27.7 % — ABNORMAL LOW (ref 36.0–46.0)
Hemoglobin: 9.3 g/dL — ABNORMAL LOW (ref 12.0–15.0)
MCH: 31.6 pg (ref 26.0–34.0)
MCHC: 33.6 g/dL (ref 30.0–36.0)
MCV: 94.2 fL (ref 80.0–100.0)
Platelets: 468 10*3/uL — ABNORMAL HIGH (ref 150–400)
RBC: 2.94 MIL/uL — ABNORMAL LOW (ref 3.87–5.11)
RDW: 15.2 % (ref 11.5–15.5)
WBC: 12.7 10*3/uL — ABNORMAL HIGH (ref 4.0–10.5)
nRBC: 0 % (ref 0.0–0.2)

## 2019-09-22 LAB — RENAL FUNCTION PANEL
Albumin: 3.1 g/dL — ABNORMAL LOW (ref 3.5–5.0)
Anion gap: 8 (ref 5–15)
BUN: 20 mg/dL (ref 8–23)
CO2: 26 mmol/L (ref 22–32)
Calcium: 9.6 mg/dL (ref 8.9–10.3)
Chloride: 108 mmol/L (ref 98–111)
Creatinine, Ser: 0.9 mg/dL (ref 0.44–1.00)
GFR calc Af Amer: >60 mL/min (ref >60)
GFR calc non Af Amer: >60 mL/min (ref >60)
Glucose, Bld: 92 mg/dL (ref 70–99)
Phosphorus: 3 mg/dL (ref 2.5–4.6)
Potassium: 3.7 mmol/L (ref 3.5–5.1)
Sodium: 142 mmol/L (ref 135–145)

## 2019-09-22 LAB — MAGNESIUM: Magnesium: 2.1 mg/dL (ref 1.7–2.4)

## 2019-09-22 LAB — HEPARIN LEVEL (UNFRACTIONATED): Heparin Unfractionated: 0.3 IU/mL (ref 0.30–0.70)

## 2019-09-22 MED ORDER — APIXABAN 5 MG PO TABS: 5.0000 mg | ORAL_TABLET | Freq: Two times a day (BID) | ORAL | Status: DC

## 2019-09-22 MED ORDER — LIDOCAINE 5 % EX PTCH
1.0000 | MEDICATED_PATCH | CUTANEOUS | Status: DC
  Administered 2019-09-22 – 2019-09-27 (×6): 1 via TRANSDERMAL
  Filled 2019-09-22 (×6): qty 1

## 2019-09-22 MED ORDER — LORAZEPAM 0.5 MG PO TABS: 0.5000 mg | ORAL_TABLET | Freq: Two times a day (BID) | ORAL | Status: DC | PRN

## 2019-09-22 MED ORDER — ENSURE ENLIVE PO LIQD
237.0000 mL | ORAL | Status: DC
  Administered 2019-09-22 – 2019-09-26 (×5): 237 mL via ORAL

## 2019-09-22 MED ORDER — APIXABAN 5 MG PO TABS
10.0000 mg | ORAL_TABLET | Freq: Two times a day (BID) | ORAL | Status: DC
  Administered 2019-09-22 – 2019-09-27 (×11): 10 mg via ORAL
  Filled 2019-09-22 (×11): qty 2

## 2019-09-22 NOTE — Consult Note (Signed)
ANTICOAGULATION CONSULT NOTE - Initial Consult  Pharmacy Consult for Eliquis Indication: Subclavian DVT  Allergies  Allergen Reactions  . Aspirin Other (See Comments)    GI ulcers  . Morphine And Related Other (See Comments)    Delerium, itching  . Nsaids Other (See Comments)    GI ulcers    Patient Measurements: Height: 5\' 6"  (167.6 cm) Weight: 62.1 kg (136 lb 14.5 oz) IBW/kg (Calculated) : 59.3   Vital Signs: Temp: 98.7 F (37.1 C) (07/09 0512) Temp Source: Oral (07/09 0512) BP: 130/70 (07/09 0512) Pulse Rate: 72 (07/09 0512)  Labs: Recent Labs    09/20/19 0826 09/20/19 0826 09/20/19 1548 09/20/19 1715 09/21/19 0440 09/22/19 0521  HGB 9.3*   < >  --   --  9.4* 9.3*  HCT 26.7*  --   --   --  27.0* 27.7*  PLT 438*  --   --   --  456* 468*  HEPARINUNFRC 0.36  --   --   --  0.35 0.30  CREATININE 1.06*  --   --   --  1.00 0.90  0.86  TROPONINIHS  --   --  47* 45*  --   --    < > = values in this interval not displayed.    Estimated Creatinine Clearance: 50.6 mL/min (by C-G formula based on SCr of 0.9 mg/dL).   Medical History: Past Medical History:  Diagnosis Date  . Anxiety   . Asthma   . COPD (chronic obstructive pulmonary disease) ([place])   . Depression   . GERD (gastroesophageal reflux disease)   . Hypertension     Medications:  No PTA anticoagulant of record.  Pt has been on heparin drip since 7/3.    Assessment: 74 y.o.femalewith PMH significant for COPD, Asthma, GERD, MGUS, Hydronephrosis of left Kidney, HLD, Anxiety and Depression, Thrombocytosis, Bipolar disorder, Thyroid disease, tobacco abuse, bleeding disorder,hx of gross hematuria and left hydronephrosis, s/p left ureteroscopy 08/10/19.     Pharmacy has been consulted to initiate and monitor Eliquis dosing for subclavian DVT.  Goal of Therapy:  Monitor platelets by anticoagulation protocol: Yes   Plan:  Will dc heparin drip with first dose of Eliquis.  Will start Eliquis 10mg  bid x  7 days, followed by Eliquis 5mg  bid thereafter.  Lu Duffel, PharmD, BCPS Clinical Pharmacist 09/22/2019 7:32 AM

## 2019-09-22 NOTE — Progress Notes (Signed)
PROGRESS NOTE    Pamela James    Code Status: Full Code  XFG:[id number] DOB: March 26, 1945 DOA: 09/15/2019 LOS: 7 days  PCP: Cindra Eves, MD CC:  Chief Complaint  Patient presents with  . 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  Was admitted on 7/2 with acute on chronic hypoxic respiratory failure and altered mental status in the setting of COPD exacerbation.  She required intubation on admission.  She apparently had been receiving pain medications for pain related to her urologic procedure and had brief loss of pulse and received chest compressions.  Her ICU course was slightly prolonged due to an apparent withdrawal syndrome, possibly due to alcohol but unknown, requiring Precedex drip.  She had an apparent aspiration event as well and is on Unasyn.  She has since been extubated and is off all drip medications and stable for transfer to [facility] service 09/21/2019.  Noted to have dark/cloudy urine on 7/8 and renal US was ordered which showed simple left renal cyst and mild bladder wall thickening posteriorly with a questionable small perinephric fluid collection adjacent to mid right kidney.  CT was ordered for further evaluation which showed left ureteral stent grossly good position without any significant hydronephrosis or obstruction and small nonobstructive renal calculi on the right without any other significant findings. Urology was consulted    A & P   Active Problems:   Acute on chronic respiratory failure with hypoxia (HCC)   1. Acute hypoxic respiratory failure secondary to possible aspiration pneumonia and suspected  atelectasis exacerbated by post CPR anxiety a. Extubated and off sedation, currently on nasal cannula b. Anxious today and nervous to take off her [place] due to her recent traumatic CPR event. - add Ativan 0.5 mg bid PRN c. Hypoxic on ambulation - continue O2 d. Add Incentive spirometer e. Completed 7 days Unasyn f. As needed bronchodilators g. Encourage smoking cessation h. If she declines will check an xray i. OOB j. PCCM on board  2. Bradycardia, resolved a. S/p transient loss of pulse with ROSC, currently off dopamine drip b. Per cardiology, continue current therapy  3. Change in urine with history of left hydronephrosis s/p left cystoureteroscopy, stent and tumor biopsy at [facility]  History of Left ureteral tumor a. Noted to have dark/cloudy urine on 7/8 and renal US was ordered which showed simple left renal cyst and mild bladder wall thickening posteriorly with a questionable small perinephric fluid collection adjacent to mid right kidney.  CT was ordered for further evaluation which showed left ureteral stent grossly good position without any significant hydronephrosis or obstruction and small nonobstructive renal calculi on the right without any other significant findings b. Improving today, will discontinue urology consult for now  4. AKI, resolved  5. Chest pain, secondary to CPR a. Tylenol PRN b. lidoderm patch  6. Left subclavian DVT a. Change heparin drip to eliquis  7. Acute metabolic encephalopathy secondary to metabolic derangement a. resolved   DVT prophylaxis: SCDs Start: 09/15/19 1703   apixaban (ELIQUIS) tablet 10 mg  apixaban (ELIQUIS) tablet 5 mg   Family Communication: Patient's son has been updated yesterday  Disposition Plan: pending improved respiratory status, hopefully  discharge in 24-48 hours Status is: Inpatient  Remains inpatient appropriate because:Unsafe d/c plan and IV treatments appropriate due to intensity of illness or inability to take  PO   Dispo: The patient is from: Home              Anticipated d/c is to: TBD              Anticipated d/c date is: 2 days              Patient currently is not medically stable to d/c.          Pressure injury documentation    None  Consultants  Urology Cardiology PCCM  Procedures   Intubated-> extubated  Antibiotics   Anti-infectives (From admission, onward)   Start     Dose/Rate Route Frequency Ordered Stop   09/19/19 1000  vancomycin (VANCOCIN) IVPB 1000 mg/200 mL premix  Status:  Discontinued        1,000 mg 200 mL/hr over 60 Minutes Intravenous Every 24 hours 09/18/19 1228 09/19/19 0904   09/19/19 0700  vancomycin (VANCOREADY) IVPB 1250 mg/250 mL  Status:  Discontinued        1,250 mg 166.7 mL/hr over 90 Minutes Intravenous Every 24 hours 09/18/19 0701 09/18/19 1228   09/18/19 0700  vancomycin (VANCOREADY) IVPB 1500 mg/300 mL        1,500 mg 150 mL/hr over 120 Minutes Intravenous  Once 09/18/19 0652 09/18/19 1049   09/17/19 1155  Ampicillin-Sulbactam (UNASYN) 3 g in sodium chloride 0.9 % 100 mL IVPB  Status:  Discontinued        3 g 200 mL/hr over 30 Minutes Intravenous Every 6 hours 09/17/19 1056 09/22/19 1156   09/15/19 1800  Ampicillin-Sulbactam (UNASYN) 3 g in sodium chloride 0.9 % 100 mL IVPB  Status:  Discontinued        3 g 200 mL/hr over 30 Minutes Intravenous Every 12 hours 09/15/19 1758 09/17/19 1056   09/15/19 1545  cefTRIAXone (ROCEPHIN) 1 g in sodium chloride 0.9 % 100 mL IVPB        1 g 200 mL/hr over 30 Minutes Intravenous  Once 09/15/19 1532 09/15/19 1617   09/15/19 1545  azithromycin (ZITHROMAX) 500 mg in sodium chloride 0.9 % 250 mL IVPB        500 mg 250 mL/hr over 60 Minutes Intravenous  Once 09/15/19 1532 09/15/19 1742        Subjective   Complaining of anxiety related to her recent CPR event and is very nervous to take her O2 off. I was also notified by nursing that she required O2 on ambulation due to desaturations. Patient was  noted to be slouched over in the bed and myself and bedside nurse helped sit the patient upright. She has some chest wall pain. No other issues or overnight events  Objective   Vitals:   09/22/19 1108 09/22/19 1145 09/22/19 1204 09/22/19 1207  BP:  (!) 140/58 (!) 135/49   Pulse:  70 68   Resp:  (!) 26 20   Temp:  98.1 F (36.7 C)  98.4 F (36.9 C)  TempSrc:  Oral  Oral  SpO2: 94% 100% 99%   Weight:      Height:        Intake/Output Summary (Last 24 hours) at 09/22/2019 1307 Last data filed at 09/22/2019 0500 Gross per 24 hour  Intake 644.01 ml  Output 650 ml  Net -5.99 ml   [employer]   09/20/19  5176 09/21/19 0500 09/22/19 0512  Weight: 63.5 kg 63.2 kg 62.1 kg    Examination:  Physical Exam Vitals and nursing note reviewed.  Constitutional:      Appearance: Normal appearance.  HENT:     Head: Normocephalic and atraumatic.  Eyes:     Conjunctiva/sclera: Conjunctivae normal.  Cardiovascular:     Rate and Rhythm: Normal rate and regular rhythm.  Pulmonary:     Effort: Pulmonary effort is normal.     Breath sounds: Normal breath sounds.     Comments: On [place] Abdominal:     General: Abdomen is flat.     Palpations: Abdomen is soft.  Musculoskeletal:        General: No swelling or tenderness.  Skin:    Coloration: Skin is not jaundiced or pale.  Neurological:     Mental Status: She is alert. Mental status is at baseline.  Psychiatric:        Mood and Affect: Mood is anxious.        Behavior: Behavior normal.     Data Reviewed: I have personally reviewed following labs and imaging studies  CBC: Recent Labs  Lab 09/15/19 1513 09/16/19 0450 09/17/19 0541 09/17/19 0541 09/18/19 0500 09/19/19 0523 09/20/19 0826 09/21/19 0440 09/22/19 0521  WBC 15.0*   < > 17.9*   < > 21.0* 18.6* 14.0* 12.1* 12.7*  NEUTROABS 10.5*  --  14.6*  --   --   --   --   --   --   HGB 10.7*   < > 8.2*   < > 8.0* 8.9* 9.3* 9.4* 9.3*  HCT 31.6*   < > 23.5*   < > 23.5* 25.1* 26.7*  27.0* 27.7*  MCV 96.9   < > 93.6   < > 94.0 89.6 92.1 89.7 94.2  PLT 696*   < > 369   < > 349 398 438* 456* 468*   < > = values in this interval not displayed.   Basic Metabolic Panel: Recent Labs  Lab 09/18/19 0500 09/19/19 0523 09/20/19 0826 09/21/19 0440 09/22/19 0521  NA 139 141 141 143 142  142  K 3.7 3.4* 3.1* 4.2 3.7  3.6  CL 99 103 97* 104 108  107  CO2 32 27 28 28 26  25   GLUCOSE 91 90 92 106* 92  94  BUN 32* 21 25* 25* 20  21  CREATININE 1.13* 0.95 1.06* 1.00 0.90  0.86  CALCIUM 9.1 9.1 9.2 9.4 9.6  9.5  MG 1.8 2.0 2.1 2.1 2.1  PHOS 3.0 2.2* 4.2 2.9 3.0   GFR: Estimated Creatinine Clearance: 50.6 mL/min (by C-G formula based on SCr of 0.9 mg/dL). Liver Function Tests: Recent Labs  Lab 09/15/19 1513 09/17/19 0641 09/18/19 0500 09/19/19 0523 09/20/19 0826 09/21/19 0440 09/22/19 0521  AST 40  --  60*  --   --   --   --   ALT 40  --  116*  --   --   --   --   ALKPHOS 61  --  87  --   --   --   --   BILITOT 0.7  --  1.6*  --   --   --   --   PROT 6.8  --  5.9*  --   --   --   --   ALBUMIN 3.7   < > 3.0* 2.9* 3.2* 3.1* 3.1*   < > = values in this interval not  displayed.   Recent Labs  Lab 09/15/19 1725  LIPASE 29  AMYLASE 81   No results for input(s): AMMONIA in the last 168 hours. Coagulation Profile: Recent Labs  Lab 09/15/19 2012  INR 1.1   Cardiac Enzymes: No results for input(s): CKTOTAL, CKMB, CKMBINDEX, TROPONINI in the last 168 hours. BNP (last 3 results) No results for input(s): PROBNP in the last 8760 hours. HbA1C: No results for input(s): HGBA1C in the last 72 hours. CBG: Recent Labs  Lab 09/21/19 2007 09/22/19 0004 09/22/19 0508 09/22/19 0819 09/22/19 1154  GLUCAP 99 122* 78 99 98   Lipid Profile: No results for input(s): CHOL, HDL, LDLCALC, TRIG, CHOLHDL, LDLDIRECT in the last 72 hours. Thyroid Function Tests: No results for input(s): TSH, T4TOTAL, FREET4, T3FREE, THYROIDAB in the last 72 hours. Anemia Panel: No  results for input(s): VITAMINB12, FOLATE, FERRITIN, TIBC, IRON, RETICCTPCT in the last 72 hours. Sepsis Labs: Recent Labs  Lab 09/15/19 1513 09/15/19 1725 09/16/19 0450 09/17/19 0541  PROCALCITON  --  0.15 0.56 0.38  LATICACIDVEN 1.3  --   --   --     Recent Results (from the past 240 hour(s))  Culture, blood (routine x 2)     Status: Abnormal   Collection Time: 09/15/19  3:13 PM   Specimen: BLOOD  Result Value Ref Range Status   Specimen Description   Final    BLOOD BLOOD LEFT HAND Performed at [facility], [address] [place], [place] [address]    Special Requests   Final    BOTTLES DRAWN AEROBIC AND ANAEROBIC Blood Culture results may not be optimal due to an excessive volume of blood received in culture bottles Performed at [facility], [address] [place], [place] [address]    Culture  Setup Time   Final    GRAM POSITIVE COCCI ANAEROBIC BOTTLE ONLY CRITICAL RESULT CALLED TO, READ BACK BY AND VERIFIED WITH:  SHEEMA HALLAJI ON 09/16/2019 AT 1214 TIK    Culture (A)  Final    STAPHYLOCOCCUS SPECIES (COAGULASE NEGATIVE) THE SIGNIFICANCE OF ISOLATING THIS ORGANISM FROM A SINGLE SET OF BLOOD CULTURES WHEN MULTIPLE SETS ARE DRAWN IS UNCERTAIN. PLEASE NOTIFY THE MICROBIOLOGY DEPARTMENT WITHIN ONE WEEK IF SPECIATION AND SENSITIVITIES ARE REQUIRED. Performed at [facility], [place] [address] [place], [place] [address]    Report Status 09/19/2019 FINAL  Final  Blood Culture ID Panel (Reflexed)     Status: Abnormal   Collection Time: 09/15/19  3:13 PM  Result Value Ref Range Status   Enterococcus species NOT DETECTED NOT DETECTED Final   Listeria monocytogenes NOT DETECTED NOT DETECTED Final   Staphylococcus species DETECTED (A) NOT DETECTED Final    Comment: Methicillin (oxacillin) susceptible coagulase negative staphylococcus. Possible blood culture contaminant (unless isolated from more than one blood culture draw or clinical case suggests  pathogenicity). No antibiotic treatment is indicated for blood  culture contaminants. CRITICAL RESULT CALLED TO, READ BACK BY AND VERIFIED WITH: SHEEMA HALLAJI ON 09/16/2019 AT 1214 TIK    Staphylococcus aureus (BCID) NOT DETECTED NOT DETECTED Final   Methicillin resistance NOT DETECTED NOT DETECTED Final   Streptococcus species NOT DETECTED NOT DETECTED Final   Streptococcus agalactiae NOT DETECTED NOT DETECTED Final   Streptococcus pneumoniae NOT DETECTED NOT DETECTED Final   Streptococcus pyogenes NOT DETECTED NOT DETECTED Final   Acinetobacter baumannii NOT DETECTED NOT DETECTED Final   Enterobacteriaceae species NOT DETECTED NOT DETECTED Final   Enterobacter cloacae complex NOT DETECTED NOT  DETECTED Final   Escherichia coli NOT DETECTED NOT DETECTED Final   Klebsiella oxytoca NOT DETECTED NOT DETECTED Final   Klebsiella pneumoniae NOT DETECTED NOT DETECTED Final   Proteus species NOT DETECTED NOT DETECTED Final   Serratia marcescens NOT DETECTED NOT DETECTED Final   Haemophilus influenzae NOT DETECTED NOT DETECTED Final   Neisseria meningitidis NOT DETECTED NOT DETECTED Final   Pseudomonas aeruginosa NOT DETECTED NOT DETECTED Final   Candida albicans NOT DETECTED NOT DETECTED Final   Candida glabrata NOT DETECTED NOT DETECTED Final   Candida krusei NOT DETECTED NOT DETECTED Final   Candida parapsilosis NOT DETECTED NOT DETECTED Final   Candida tropicalis NOT DETECTED NOT DETECTED Final    Comment: Performed at [facility], [address] [place], [place] [address]  Culture, blood (routine x 2)     Status: None   Collection Time: 09/15/19  3:50 PM   Specimen: BLOOD  Result Value Ref Range Status   Specimen Description BLOOD LEFT ARM  Final   Special Requests   Final    BOTTLES DRAWN AEROBIC AND ANAEROBIC Blood Culture adequate volume   Culture   Final    NO GROWTH 5 DAYS Performed at [facility], [address], [place], [place] [address]    Report  Status 09/20/2019 FINAL  Final  SARS Coronavirus 2 by RT PCR (hospital order, performed in [facility] hospital lab) Nasopharyngeal Nasopharyngeal Swab     Status: None   Collection Time: 09/15/19  3:55 PM   Specimen: Nasopharyngeal Swab  Result Value Ref Range Status   SARS Coronavirus 2 NEGATIVE NEGATIVE Final    Comment: (NOTE) SARS-CoV-2 target nucleic acids are NOT DETECTED.  The SARS-CoV-2 RNA is generally detectable in upper and lower respiratory specimens during the acute phase of infection. The lowest concentration of SARS-CoV-2 viral copies this assay can detect is 250 copies / mL. A negative result does not preclude SARS-CoV-2 infection and should not be used as the sole basis for treatment or other patient management decisions.  A negative result may occur with improper specimen collection / handling, submission of specimen other than nasopharyngeal swab, presence of viral mutation(s) within the areas targeted by this assay, and inadequate number of viral copies (<250 copies / mL). A negative result must be combined with clinical observations, patient history, and epidemiological information.  Fact Sheet for Patients:   StrictlyIdeas.no  Fact Sheet for Healthcare Providers: BankingDealers.co.za  This test is not yet approved or  cleared by the [place] FDA and has been authorized for detection and/or diagnosis of SARS-CoV-2 by FDA under an Emergency Use Authorization (EUA).  This EUA will remain in effect (meaning this test can be used) for the duration of the COVID-19 declaration under Section 564(b)(1) of the Act, 21 U.S.C. section 360bbb-3(b)(1), unless the authorization is terminated or revoked sooner.  Performed at [facility], [address], [place], [place] [address]   Urine Culture     Status: None   Collection Time: 09/15/19  6:11 PM   Specimen: Urine, Random  Result Value Ref Range Status    Specimen Description   Final    URINE, RANDOM Performed at [facility], [address], [place], [place] [address]    Special Requests   Final    NONE Performed at [facility], [address], [place],  [address]    Culture   Final    NO GROWTH Performed at [facility], [address] [place], [place] [address]    Report Status 09/17/2019 FINAL  Final  MRSA PCR Screening     Status: None   Collection Time: 09/15/19  6:51 PM   Specimen: Nasal Mucosa; Nasopharyngeal  Result Value Ref Range Status   MRSA by PCR NEGATIVE NEGATIVE Final    Comment:        The GeneXpert MRSA Assay (FDA approved for NASAL specimens only), is one component of a comprehensive MRSA colonization surveillance program. It is not intended to diagnose MRSA infection nor to guide or monitor treatment for MRSA infections. Performed at [facility], [address] [place], [place] [address]   CULTURE, BLOOD (ROUTINE X 2) w Reflex to ID Panel     Status: None (Preliminary result)   Collection Time: 09/18/19  9:13 AM   Specimen: BLOOD  Result Value Ref Range Status   Specimen Description BLOOD BLOOD RIGHT HAND  Final   Special Requests   Final    BOTTLES DRAWN AEROBIC AND ANAEROBIC Blood Culture adequate volume   Culture   Final    NO GROWTH 4 DAYS Performed at [facility], [address] [place], [place] [address]    Report Status PENDING  Incomplete  CULTURE, BLOOD (ROUTINE X 2) w Reflex to ID Panel     Status: None (Preliminary result)   Collection Time: 09/18/19  9:45 AM   Specimen: BLOOD  Result Value Ref Range Status   Specimen Description BLOOD BLOOD RIGHT FOREARM  Final   Special Requests   Final    BOTTLES DRAWN AEROBIC AND ANAEROBIC Blood Culture adequate volume   Culture   Final    NO GROWTH 4 DAYS Performed at [facility], [address] [place], [place] [address]    Report Status PENDING  Incomplete          Radiology Studies: CT ABDOMEN PELVIS W CONTRAST  Result Date: 09/21/2019 CLINICAL DATA:  Abdominal abscess or infection. EXAM: CT ABDOMEN AND PELVIS WITH CONTRAST TECHNIQUE: Multidetector CT imaging of the abdomen and pelvis was performed using the standard protocol following bolus administration of intravenous contrast. CONTRAST:  118mL OMNIPAQUE IOHEXOL 300 MG/ML  SOLN COMPARISON:  None. FINDINGS: Lower chest: Small bilateral pleural effusions are noted with adjacent atelectasis of both lower lobes. Hepatobiliary: No focal liver abnormality is seen. No gallstones, gallbladder wall thickening, or biliary dilatation. Pancreas: Unremarkable. No pancreatic ductal dilatation or surrounding inflammatory changes. Spleen: Normal in size without focal abnormality. Adrenals/Urinary Tract: Adrenal glands appear normal. Small nonobstructive right renal calculi are noted. Left ureteral stent is noted in grossly good position. No significant hydronephrosis or renal obstruction is noted. Urinary bladder is unremarkable. Stomach/Bowel: The stomach appears normal. There is no evidence of bowel obstruction or inflammation. The appendix is not visualized, but no inflammation is noted in the right lower quadrant. Vascular/Lymphatic: Aortic atherosclerosis. No enlarged abdominal or pelvic lymph nodes. Reproductive: Status post hysterectomy. No adnexal masses. Other: Small fat containing periumbilical hernia is noted. No ascites is noted. Musculoskeletal: No acute or significant osseous findings. IMPRESSION: 1. Small bilateral pleural effusions are noted with adjacent atelectasis of both lower lobes. 2. Left ureteral stent is noted in grossly good position. No significant hydronephrosis or renal obstruction is noted. 3. Small nonobstructive right renal calculi. 4. Small fat containing periumbilical hernia. Aortic Atherosclerosis (ICD10-I70.0). Electronically Signed   By: Marijo Conception M.D.   On: 09/21/2019 13:15   US  RENAL  Result Date: 09/21/2019 CLINICAL DATA:  Recent cystoscopy with biopsy of ureteral tumor and RIGHT stent placement, now with dark urine and containing sediment EXAM: RENAL / URINARY TRACT ULTRASOUND COMPLETE COMPARISON:  Renal ultrasound 09/16/2019 FINDINGS: Right Kidney: Renal measurements: 9.9 x 4.3 x 5.1 cm = volume: 114 mL. Normal cortical thickness. Upper normal cortical echogenicity. Small cortical scar upper pole RIGHT kidney. Question exophytic mass versus perinephric collection RIGHT kidney 8 x 12 x 30 mm at mid kidney. No additional mass or hydronephrosis. Left Kidney: Renal measurements: 10.2 x 5.1 x 4.9 cm = volume: 132 mL. Normal cortical thickness and echogenicity. Small cyst at upper pole 16 x 17 x 11 mm, simple features. No additional mass, hydronephrosis, or shadowing calcification. Bladder: Normally distended. BILATERAL ureteral jets visualized. Questionable wall thickening of the posterior inferior bladder wall versus edema related to recent procedure, recommend correlation with preceding cystoscopy findings. Other: Incidentally noted small LEFT pleural effusion. IMPRESSION: Small pleural effusion. Small simple LEFT renal cyst 17 mm greatest size. Question mild bladder wall thickening inferior posteriorly, recommend correlation with cystoscopy findings. Questionable small perinephric fluid collection versus complicated cystic lesion adjacent to mid RIGHT kidney; further assessment by CT imaging with contrast recommended. Electronically Signed   By: Lavonia Dana M.D.   On: 09/21/2019 11:24        Scheduled Meds: . acidophilus  1 capsule Oral Daily  . apixaban  10 mg Oral BID   Followed by  . [START ON 09/29/2019] apixaban  5 mg Oral BID  . aspirin EC  81 mg Oral Daily  . dicyclomine  10 mg Oral Once  . feeding supplement (ENSURE ENLIVE)  237 mL Oral Q24H  . insulin aspart  0-9 Units Subcutaneous Q4H  . ipratropium-albuterol  3 mL Nebulization Q4H  . pantoprazole  40 mg Oral  Daily   Continuous Infusions: . sodium chloride Stopped (09/18/19 0815)     Time spent: 36 minutes with over 50% of the time coordinating the patient's care    Harold Hedge, DO [facility] Pager [phone]  Call night coverage person covering after 7pm

## 2019-09-22 NOTE — Plan of Care (Signed)
Continuing with plan of care. 

## 2019-09-22 NOTE — Care Management Important Message (Signed)
Important Message  Patient Details  Name: Pamela James MRN: [id number] Date of Birth: 12/07/1945   Medicare Important Message Given:  Yes  Initial Medicare IM given by Patient Access Associate on 09/21/2019 at 12:05pm.   Dannette Barbara 09/22/2019, 8:37 AM

## 2019-09-22 NOTE — Consult Note (Signed)
ANTICOAGULATION CONSULT NOTE  Pharmacy Consult for Heparin infusion  Indication: Subclavian clot  Patient Measurements: Height: 5\' 6"  (167.6 cm) Weight: 62.1 kg (136 lb 14.5 oz) IBW/kg (Calculated) : 59.3  Vital Signs: Temp: 98.7 F (37.1 C) (07/09 0512) Temp Source: Oral (07/09 0512) BP: 130/70 (07/09 0512) Pulse Rate: 72 (07/09 0512)  Labs: Recent Labs    09/20/19 0826 09/20/19 0826 09/20/19 1548 09/20/19 1715 09/21/19 0440 09/22/19 0521  HGB 9.3*   < >  --   --  9.4* 9.3*  HCT 26.7*  --   --   --  27.0* 27.7*  PLT 438*  --   --   --  456* 468*  HEPARINUNFRC 0.36  --   --   --  0.35 0.30  CREATININE 1.06*  --   --   --  1.00 0.86  TROPONINIHS  --   --  47* 45*  --   --    < > = values in this interval not displayed.    Estimated Creatinine Clearance: 52.9 mL/min (by C-G formula based on SCr of 0.86 mg/dL).  Medical History: Past Medical History:  Diagnosis Date  . Anxiety   . Asthma   . COPD (chronic obstructive pulmonary disease) ([place])   . Depression   . GERD (gastroesophageal reflux disease)   . Hypertension    Assessment: Pharmacy consulted for heparin infusion dosing and monitoring for 74 yo female with a subclavian clot. She is on no chronic anticoagulation PTA. H&H, platelets have been trending down but appear to have stabilized here.   Heparin Course: 7/3 initiation: 4000 unit bolus then 1150 units/hr 7/3 2023 HL 1.19: dec to 700 units/hr 7/4 0641 HL 0.27: inc to 800 units/hr 7/4 2142 HL 0.20: inc to 950 units/hr 7/5 0913 HL 0.33: no change 7/5 1601 HL 0.30: no change 7/6 0523 HL 0.18: inc to 1150 units/hr 7/6 1508 HL 0.39: no change 7/6 2328 HL 0.35: no change 7/7 0826 HL 0.36: no change 7/8 0440 HL 0.35: no change 7/9 0521 HL 0.30: no change  Goal of Therapy:  Heparin level 0.3-0.7 units/ml Monitor platelets by anticoagulation protocol: Yes   Plan:   Heparin level therapeutic: continue infusion at 1150 units/hr   This is the 5th  consecutive therapeutic heparin level: recheck heparin level in am and daily  CBC in am  Ena Dawley, PharmD Clinical Pharmacist 09/22/2019 6:29 AM

## 2019-09-22 NOTE — Progress Notes (Signed)
[facility] Cardiology    SUBJECTIVE: Patient feels much better today no shortness of breath no chest pain no palpitation no weakness is inquiring about when she will be able to go home patient is in good spirits.   Vitals:   09/21/19 2000 09/21/19 2154 09/22/19 0133 09/22/19 0512  BP:  (!) 142/77 (!) 166/51 130/70  Pulse:  73 63 72  Resp:  (!) 22 18 18   Temp: 99.1 F (37.3 C) 99.1 F (37.3 C) 98.5 F (36.9 C) 98.7 F (37.1 C)  TempSrc: Oral Oral Oral Oral  SpO2:  98% 97% 95%  Weight:    62.1 kg  Height:         Intake/Output Summary (Last 24 hours) at 09/22/2019 0827 Last data filed at 09/22/2019 0500 Gross per 24 hour  Intake 644.01 ml  Output 750 ml  Net -105.99 ml      PHYSICAL EXAM  General: Well developed, well nourished, in no acute distress HEENT:  Normocephalic and atramatic Neck:  No JVD.  Lungs: Clear bilaterally to auscultation and percussion. Heart: HRRR . Normal S1 and S2 without gallops or murmurs.  Abdomen: Bowel sounds are positive, abdomen soft and non-tender  Msk:  Back normal, normal gait. Normal strength and tone for age. Extremities: No clubbing, cyanosis or edema.   Neuro: Alert and oriented X 3. Psych:  Good affect, responds appropriately   LABS: Basic Metabolic Panel: Recent Labs    09/21/19 0440 09/22/19 0521  NA 143 142  142  K 4.2 3.7  3.6  CL 104 108  107  CO2 28 26  25   GLUCOSE 106* 92  94  BUN 25* 20  21  CREATININE 1.00 0.90  0.86  CALCIUM 9.4 9.6  9.5  MG 2.1 2.1  PHOS 2.9 3.0   Liver Function Tests: Recent Labs    09/21/19 0440 09/22/19 0521  ALBUMIN 3.1* 3.1*   No results for input(s): LIPASE, AMYLASE in the last 72 hours. CBC: Recent Labs    09/21/19 0440 09/22/19 0521  WBC 12.1* 12.7*  HGB 9.4* 9.3*  HCT 27.0* 27.7*  MCV 89.7 94.2  PLT 456* 468*   Cardiac Enzymes: No results for input(s): CKTOTAL, CKMB, CKMBINDEX, TROPONINI in the last 72 hours. BNP: Invalid input(s): POCBNP D-Dimer: No results  for input(s): DDIMER in the last 72 hours. Hemoglobin A1C: No results for input(s): HGBA1C in the last 72 hours. Fasting Lipid Panel: No results for input(s): CHOL, HDL, LDLCALC, TRIG, CHOLHDL, LDLDIRECT in the last 72 hours. Thyroid Function Tests: No results for input(s): TSH, T4TOTAL, T3FREE, THYROIDAB in the last 72 hours.  Invalid input(s): FREET3 Anemia Panel: No results for input(s): VITAMINB12, FOLATE, FERRITIN, TIBC, IRON, RETICCTPCT in the last 72 hours.  CT ABDOMEN PELVIS W CONTRAST  Result Date: 09/21/2019 CLINICAL DATA:  Abdominal abscess or infection. EXAM: CT ABDOMEN AND PELVIS WITH CONTRAST TECHNIQUE: Multidetector CT imaging of the abdomen and pelvis was performed using the standard protocol following bolus administration of intravenous contrast. CONTRAST:  121mL OMNIPAQUE IOHEXOL 300 MG/ML  SOLN COMPARISON:  None. FINDINGS: Lower chest: Small bilateral pleural effusions are noted with adjacent atelectasis of both lower lobes. Hepatobiliary: No focal liver abnormality is seen. No gallstones, gallbladder wall thickening, or biliary dilatation. Pancreas: Unremarkable. No pancreatic ductal dilatation or surrounding inflammatory changes. Spleen: Normal in size without focal abnormality. Adrenals/Urinary Tract: Adrenal glands appear normal. Small nonobstructive right renal calculi are noted. Left ureteral stent is noted in grossly good position. No significant hydronephrosis or  renal obstruction is noted. Urinary bladder is unremarkable. Stomach/Bowel: The stomach appears normal. There is no evidence of bowel obstruction or inflammation. The appendix is not visualized, but no inflammation is noted in the right lower quadrant. Vascular/Lymphatic: Aortic atherosclerosis. No enlarged abdominal or pelvic lymph nodes. Reproductive: Status post hysterectomy. No adnexal masses. Other: Small fat containing periumbilical hernia is noted. No ascites is noted. Musculoskeletal: No acute or significant  osseous findings. IMPRESSION: 1. Small bilateral pleural effusions are noted with adjacent atelectasis of both lower lobes. 2. Left ureteral stent is noted in grossly good position. No significant hydronephrosis or renal obstruction is noted. 3. Small nonobstructive right renal calculi. 4. Small fat containing periumbilical hernia. Aortic Atherosclerosis (ICD10-I70.0). Electronically Signed   By: Marijo Conception M.D.   On: 09/21/2019 13:15   US RENAL  Result Date: 09/21/2019 CLINICAL DATA:  Recent cystoscopy with biopsy of ureteral tumor and RIGHT stent placement, now with dark urine and containing sediment EXAM: RENAL / URINARY TRACT ULTRASOUND COMPLETE COMPARISON:  Renal ultrasound 09/16/2019 FINDINGS: Right Kidney: Renal measurements: 9.9 x 4.3 x 5.1 cm = volume: 114 mL. Normal cortical thickness. Upper normal cortical echogenicity. Small cortical scar upper pole RIGHT kidney. Question exophytic mass versus perinephric collection RIGHT kidney 8 x 12 x 30 mm at mid kidney. No additional mass or hydronephrosis. Left Kidney: Renal measurements: 10.2 x 5.1 x 4.9 cm = volume: 132 mL. Normal cortical thickness and echogenicity. Small cyst at upper pole 16 x 17 x 11 mm, simple features. No additional mass, hydronephrosis, or shadowing calcification. Bladder: Normally distended. BILATERAL ureteral jets visualized. Questionable wall thickening of the posterior inferior bladder wall versus edema related to recent procedure, recommend correlation with preceding cystoscopy findings. Other: Incidentally noted small LEFT pleural effusion. IMPRESSION: Small pleural effusion. Small simple LEFT renal cyst 17 mm greatest size. Question mild bladder wall thickening inferior posteriorly, recommend correlation with cystoscopy findings. Questionable small perinephric fluid collection versus complicated cystic lesion adjacent to mid RIGHT kidney; further assessment by CT imaging with contrast recommended. Electronically Signed   By:  Lavonia Dana M.D.   On: 09/21/2019 11:24     Echo normal left ventricular function around 60%  TELEMETRY: Normal sinus rhythm nonspecific ST-T wave changes:  ASSESSMENT AND PLAN:  Active Problems:   Acute on chronic respiratory failure with hypoxia (HCC) Improved from sepsis Bradycardia resolved Hypotension also resolved Metabolic acidosis also improved Hypoxemia improved Urethral tumor  Plan Continue conservative cardiac therapy Maintain adequate hydration No diuretic therapy from a cardiac standpoint Supplemental oxygen as necessary for hypoxemia currently stable We will sign off now reconsult as necessary  Yolonda Kida, MD 09/22/2019 8:27 AM

## 2019-09-22 NOTE — Consult Note (Signed)
[place] for Electrolyte Monitoring and Replacement   Recent Labs: Potassium (mmol/L)  Date Value  09/22/2019 3.7  09/22/2019 3.6   Magnesium (mg/dL)  Date Value  09/22/2019 2.1   Calcium (mg/dL)  Date Value  09/22/2019 9.6  09/22/2019 9.5   Albumin (g/dL)  Date Value  09/22/2019 3.1 (L)   Phosphorus (mg/dL)  Date Value  09/22/2019 3.0   Sodium (mmol/L)  Date Value  09/22/2019 142  09/22/2019 142    Assessment: 74 y.o.femalewith PMH significant for COPD, Asthma, GERD, MGUS, Hydronephrosis of left Kidney, HLD, Anxiety and Depression, Thrombocytosis, Bipolar disorder, Thyroid disease, tobacco abuse, bleeding disorder,hx of gross hematuria and left hydronephrosis, s/p left ureteroscopy 08/10/19  Goal of Therapy:  Electrolytes WNL  Plan:   No electrolyte replacement indicated today x 2  Pharmacy will sign off at this time  Lu Duffel, PharmD, BCPS Clinical Pharmacist 09/22/2019 7:24 AM

## 2019-09-22 NOTE — Progress Notes (Signed)
Initial Nutrition Assessment  DOCUMENTATION CODES:   Not applicable  INTERVENTION:  Ensure Enlive po daily, each supplement provides 350 kcal and 20 grams of protein  Magic cup BID with meals, each supplement provides 290 kcal and 9 grams of protein  NUTRITION DIAGNOSIS:   Increased nutrient needs related to acute illness, chronic illness (acute hypoxic respiratory failure s/p extubation 7/4; COPD) as evidenced by estimated needs.    GOAL:   Patient will meet greater than or equal to 90% of their needs    MONITOR:   Labs, I & O's, Supplement acceptance, PO intake, Weight trends  REASON FOR ASSESSMENT:   Malnutrition Screening Tool    ASSESSMENT:  RD working remotely.  74 year old female admitted for acute hypoxic respiratory failure, and altered mental status requiring emergent intubation with subsequent brief episode of arrest s/p requiring manual bag ventilation and CPR with ROSC. Past medical history significant for HTN, COPD, GERD, MGUS, hydronephrosis of left kidney s/p left ureteroscopy 08/10/19, malignant tumor of left ureter, scheduled for resection at [facility].  Significant Events: 7/2 - Admit, Intubated 7/3 - seizure like activity, started on Keppra 7/4 - Extubated 7/5 - DYS 1 diet  7/6 - removal of HD cath, foley 7/7 - diet advanced to DYS 2  Per chart, ICU course slightly prolonged due to apparent withdrawal syndrome, possibly due to alcohol but unknown, required Precedex drip as well as aspiration event. Patient stable for transfer to [facility] service on 7/8.   RD attempted to contact patient via phone x 2 this morning, received busy signal. Will attempt later this afternoon to talk with patient as able. No documented meals for review, attempted to speak with RN for assessment of po intake, however was unable to reach at this time. Will daily order magic cup with lunch and dinner meals as well as daily Ensure to aid with meeting needs.   Mild pitting BUE edema per  RN asessment Per chart weights have trended down ~17 lb this admission, Net 3 L since admit. Per care everywhere, pt weighed (65 kg) 143 lb on 08/2319 and (63.5 kg) 139.7 lb on 05/23/19. Current weight (62.1 kg) 136.62 lb.  Medications reviewed and include: Acidophilus, SSI, Protonix IVBP: Unasyn Labs: CBGs 99,78,122,99,110, WBC 12.7 (H), Hgb 9.3 (L) K/Mg/P - WNL 7/2 BNP 789.6 (H)  NUTRITION - FOCUSED PHYSICAL EXAM: Unable to complete at this time, RD working remotely.  Diet Order:   Diet Order            DIET DYS 2 Room service appropriate? Yes with Assist; Fluid consistency: Thin  Diet effective now                 EDUCATION NEEDS:   No education needs have been identified at this time  Skin:  Skin Assessment: Reviewed RN Assessment  Last BM:  7/7  Height:   Ht Readings from Last 1 Encounters:  09/15/19 5\' 6"  (1.676 m)    Weight:   Wt Readings from Last 1 Encounters:  09/22/19 62.1 kg    Ideal Body Weight:  59.1 kg  BMI:  Body mass index is 22.1 kg/m.  Estimated Nutritional Needs:   Kcal:  1900-2100  Protein:  85-95  Fluid:  > 1.6 L   Lajuan Lines, RD, LDN Clinical Nutrition After Hours/Weekend Pager # in [place]

## 2019-09-22 NOTE — Evaluation (Signed)
Physical Therapy Evaluation Patient Details Name: Pamela James MRN: [id number] DOB: 04/02/1945 Today's Date: 09/22/2019   History of Present Illness  Pt is a 74 y.o. female that presented to ED for SOB, admitted on 7/2 with acute on chronic hypoxic respiratory failure and altered mental status in the setting of COPD exacerbation.  She required intubation on admission, subsequently she had a brief episode of arrest where she was noted to be bradycardic and hypotensive. Extubated 7/4. Workup positive LUE DVT. PMH significant for COPD, Asthma,  GERD, MGUS, Hydronephrosis of left Kidney, HLD, Anxiety and Depression, Thrombocytosis, Bipolar disorder, Thyroid disease, tobacco abuse, bleeding disorder,hx of gross hematuria and left hydronephrosis, s/p left ureteroscopy on 09/12/2019 with stent and tumor biopsy.    Clinical Impression  Pt alert, oriented, reported she doesn't remember much from her admission to the hospital. Denied pain. At baseline the patient stated she is independent with AD (can ambulate without AD, intermittently uses SPC or RW depending on the day). I for ADLs.   The patient was very eager to mobilize, came to sitting EOB. Pt with noticeable SOB and reported feeling dizzy/light headed. On 2L spO2 readings 81%. Did not recover to >90% until resting in supine on 6L and several minutes of instructed PLB. RN informed and in room to assess patient. Further mobility deferred.  Overall the patient demonstrated deficits (see "PT Problem List") that impede the patient's functional abilities, safety, and mobility and would benefit from skilled PT intervention. Recommendation is HHPT pending pt progress with supervision/assistance 24/7.     Follow Up Recommendations Home health PT;Supervision/Assistance - 24 hour    Equipment Recommendations       Recommendations for Other Services OT consult     Precautions / Restrictions Precautions Precautions: Fall Precaution Comments: watch  O2 Restrictions Weight Bearing Restrictions: No      Mobility  Bed Mobility Overal bed mobility: Modified Independent             General bed mobility comments: supine to sit several times, able to perform modI, close supervision provided for assessment of spO2.  Transfers                 General transfer comment: deferred due to pt's difficulty breathing and spO2 readings.  Ambulation/Gait                Stairs            Wheelchair Mobility    Modified Rankin (Stroke Patients Only)       Balance Overall balance assessment: Needs assistance Sitting-balance support: Feet unsupported;No upper extremity supported Sitting balance-Leahy Scale: Good                                       Pertinent Vitals/Pain Pain Assessment: No/denies pain    Home Living Family/patient expects to be discharged to:: Private residence Living Arrangements: Children Available Help at Discharge: Family Type of Home: House Home Access: Stairs to enter Entrance Stairs-Rails: Psychiatric nurse of Steps: 3 Home Layout: One level Home Equipment: Environmental consultant - 2 wheels;Cane - single point      Prior Function Level of Independence: Independent with assistive device(s)               Hand Dominance        Extremity/Trunk Assessment   Upper Extremity Assessment Upper Extremity Assessment: Generalized weakness (LUE swollen)    Lower  Extremity Assessment Lower Extremity Assessment: Generalized weakness    Cervical / Trunk Assessment Cervical / Trunk Assessment: Normal  Communication   Communication: No difficulties  Cognition Arousal/Alertness: Awake/alert Behavior During Therapy: WFL for tasks assessed/performed Overall Cognitive Status: Within Functional Limits for tasks assessed                                        General Comments      Exercises     Assessment/Plan    PT Assessment Patient  needs continued PT services  PT Problem List Cardiopulmonary status limiting activity;Decreased activity tolerance;Decreased mobility       PT Treatment Interventions DME instruction;Therapeutic exercise;Gait training;Balance training;Stair training;Neuromuscular re-education;Functional mobility training;Patient/family education;Therapeutic activities    PT Goals (Current goals can be found in the Care Plan section)  Acute Rehab PT Goals Patient Stated Goal: to feel better PT Goal Formulation: With patient Time For Goal Achievement: 10/06/19 Potential to Achieve Goals: Good    Frequency Min 2X/week   Barriers to discharge        Co-evaluation               AM-PAC PT "6 Clicks" Mobility  Outcome Measure Help needed turning from your back to your side while in a flat bed without using bedrails?: None Help needed moving from lying on your back to sitting on the side of a flat bed without using bedrails?: None Help needed moving to and from a bed to a chair (including a wheelchair)?: A Little Help needed standing up from a chair using your arms (e.g., wheelchair or bedside chair)?: A Little Help needed to walk in hospital room?: A Little Help needed climbing 3-5 steps with a railing? : A Little 6 Click Score: 20    End of Session Equipment Utilized During Treatment: Oxygen (2L-6L)   Patient left: in bed;with bed alarm set;with call bell/phone within reach;with nursing/sitter in room Nurse Communication: Mobility status;Other (comment) (oxygen status) PT Visit Diagnosis: Unsteadiness on feet (R26.81);Other abnormalities of gait and mobility (R26.89)    Time: 4142-3953 PT Time Calculation (min) (ACUTE ONLY): 21 min   Charges:   PT Evaluation $PT Eval Moderate Complexity: 1 Mod         Lieutenant Diego PT, DPT 10:10 AM,09/22/19

## 2019-09-22 NOTE — Progress Notes (Signed)
   09/22/19 1204  Assess: MEWS Score  BP (!) 135/49  Pulse Rate 68  Resp 20  Level of Consciousness Alert  SpO2 99 %  O2 Device Nasal Cannula  Patient Activity (if Appropriate) In bed  O2 Flow Rate (L/min) 4 L/min  Assess: if the MEWS score is Yellow or Red  Were vital signs taken at a resting state? Yes  Focused Assessment Documented focused assessment  Early Detection of Sepsis Score *See Row Information* Low  MEWS guidelines implemented *See Row Information* Yes  Treat  MEWS Interventions Other (Comment) (Dr. Neysa Bonito notified and ordered CXR)  Take Vital Signs  Increase Vital Sign Frequency  Yellow: Q 2hr X 2 then Q 4hr X 2, if remains yellow, continue Q 4hrs  Escalate  MEWS: Escalate Yellow: discuss with charge nurse/RN and consider discussing with provider and RRT  Notify: Charge Nurse/RN  Name of Charge Nurse/RN Notified [place], RN  Date Charge Nurse/RN Notified 09/22/19  Time Charge Nurse/RN Notified 1208  Notify: Provider  Provider Name/Title Dr. Neysa Bonito  Date Provider Notified 09/22/19  Time Provider Notified 1209  Notification Type Page  Notification Reason Change in status  Response See new orders  Date of Provider Response 09/22/19  Time of Provider Response 1209  Notify: Rapid Response  Name of Rapid Response RN Notified Sarah, RN  Date Rapid Response Notified 09/22/19  Time Rapid Response Notified 1209  Document  Patient Outcome Other (Comment) (Monitoring for now, patient remains on the unit)  Progress note created (see row info) Yes

## 2019-09-22 NOTE — TOC Initial Note (Addendum)
Transition of Care [facility]) - Initial/Assessment Note    Patient Details  Name: Pamela James MRN: [id number] Date of Birth: 1945/04/06  Transition [facility]) CM/SW Contact:    Candie Chroman, LCSW Phone Number: 09/22/2019, 2:38 PM  Clinical Narrative:  CSW met with patient. No supports at bedside. CSW introduced role and explained that PT recommendations would be discussed. Patient agreeable to home health. Provided CMS scores for agencies that serve her zip code. No agency preference. Will see which ones can accept her and have her choose from that list. [facility] unable to start services until Wednesday or Thursday of next week. [facility] is checking to see if they are in network with her plan. [facility], [facility], and [facility] unable to accept. Left messages for [facility], [facility], and [facility]. Patient does not use DME at home but has a walker and cane in case she needs them. Patient is not on oxygen at home. Will follow for this potential home need as well. No further concerns. CSW encouraged patient to contact CSW as needed. CSW will continue to follow patient for support and facilitate return home when stable.  3:18 pm: [facility] and [facility] are unable to accept referral.  Expected Discharge Plan: [place] Barriers to Discharge: Continued Medical Work up   Patient Goals and CMS Choice   CMS Medicare.gov Compare Post Acute Care list provided to:: Patient    Expected Discharge Plan and Services Expected Discharge Plan: [place] Choice: [place] arrangements for the past 2 months: Single Family Home                                      Prior Living Arrangements/Services Living arrangements for the past 2 months: Single Family Home Lives with:: Adult Children Patient language and need for interpreter reviewed:: Yes Do you feel safe going back to the place where you live?: Yes      Need for Family  Participation in Patient Care: Yes (Comment) Care giver support system in place?: Yes (comment)   Criminal Activity/Legal Involvement Pertinent to Current Situation/Hospitalization: No - Comment as needed  Activities of Daily Living Home Assistive Devices/Equipment: Cane (specify quad or straight), Grab bars around toilet, Grab bars in shower, Walker (specify type) ADL Screening (condition at time of admission) Patient's cognitive ability adequate to safely complete daily activities?: No Is the patient deaf or have difficulty hearing?: No Does the patient have difficulty seeing, even when wearing glasses/contacts?: No Does the patient have difficulty concentrating, remembering, or making decisions?: No Patient able to express need for assistance with ADLs?: Yes Does the patient have difficulty dressing or bathing?: No Independently performs ADLs?: Yes (appropriate for developmental age) Does the patient have difficulty walking or climbing stairs?: Yes Weakness of Legs: Both Weakness of Arms/Hands: None  Permission Sought/Granted Permission sought to share information with : Facility Art therapist granted to share information with : Yes, Verbal Permission Granted     Permission granted to share info w AGENCY: Home health agencies        Emotional Assessment Appearance:: Appears stated age Attitude/Demeanor/Rapport: Engaged, Gracious Affect (typically observed): Accepting, Appropriate, Calm, Pleasant Orientation: : Oriented to Self, Oriented to Place, Oriented to  Time, Oriented to Situation Alcohol / Substance Use: Not Applicable Psych Involvement: No (comment)  Admission diagnosis:  Hyperkalemia [E87.5] Bradycardia [  R00.1] Respiratory distress [R06.03] Acute on chronic respiratory failure with hypoxia (HCC) [J96.21] Aspiration pneumonia of left lung, unspecified aspiration pneumonia type, unspecified part of lung ([place]) [J69.0] Patient Active Problem List    Diagnosis Date Noted  . Acute on chronic respiratory failure with hypoxia ([place]) 09/15/2019  . COPD exacerbation ([place]) 11/21/2017  . Acute bronchitis 03/26/2016  . Essential hypertension 03/26/2016  . Chest pain 03/25/2016   PCP:  Cindra Eves, MD Pharmacy:   [id number] [place] [id number]: [phone]: [phone]    Social Determinants of Health (SDOH) Interventions    Readmission Risk Interventions No flowsheet data found.

## 2019-09-23 DIAGNOSIS — R079 Chest pain, unspecified: Secondary | ICD-10-CM

## 2019-09-23 DIAGNOSIS — I160 Hypertensive urgency: Secondary | ICD-10-CM

## 2019-09-23 LAB — GLUCOSE, CAPILLARY
Glucose-Capillary: 101 mg/dL — ABNORMAL HIGH (ref 70–99)
Glucose-Capillary: 108 mg/dL — ABNORMAL HIGH (ref 70–99)
Glucose-Capillary: 92 mg/dL (ref 70–99)
Glucose-Capillary: 96 mg/dL (ref 70–99)
Glucose-Capillary: 97 mg/dL (ref 70–99)
Glucose-Capillary: 97 mg/dL (ref 70–99)
Glucose-Capillary: 97 mg/dL (ref 70–99)
Glucose-Capillary: 99 mg/dL (ref 70–99)

## 2019-09-23 LAB — BASIC METABOLIC PANEL
Anion gap: 9 (ref 5–15)
BUN: 16 mg/dL (ref 8–23)
CO2: 26 mmol/L (ref 22–32)
Calcium: 9.7 mg/dL (ref 8.9–10.3)
Chloride: 107 mmol/L (ref 98–111)
Creatinine, Ser: 0.72 mg/dL (ref 0.44–1.00)
GFR calc Af Amer: >60 mL/min (ref >60)
GFR calc non Af Amer: >60 mL/min (ref >60)
Glucose, Bld: 100 mg/dL — ABNORMAL HIGH (ref 70–99)
Potassium: 3.5 mmol/L (ref 3.5–5.1)
Sodium: 142 mmol/L (ref 135–145)

## 2019-09-23 LAB — CULTURE, BLOOD (ROUTINE X 2)
Culture: NO GROWTH
Culture: NO GROWTH
Special Requests: ADEQUATE
Special Requests: ADEQUATE

## 2019-09-23 LAB — CBC
HCT: 26.7 % — ABNORMAL LOW (ref 36.0–46.0)
Hemoglobin: 9.1 g/dL — ABNORMAL LOW (ref 12.0–15.0)
MCH: 31.6 pg (ref 26.0–34.0)
MCHC: 34.1 g/dL (ref 30.0–36.0)
MCV: 92.7 fL (ref 80.0–100.0)
Platelets: 497 10*3/uL — ABNORMAL HIGH (ref 150–400)
RBC: 2.88 MIL/uL — ABNORMAL LOW (ref 3.87–5.11)
RDW: 15.4 % (ref 11.5–15.5)
WBC: 11.9 10*3/uL — ABNORMAL HIGH (ref 4.0–10.5)
nRBC: 0 % (ref 0.0–0.2)

## 2019-09-23 LAB — URINALYSIS, ROUTINE W REFLEX MICROSCOPIC: Specific Gravity, Urine: 1.019 (ref 1.005–1.030)

## 2019-09-23 MED ORDER — AMLODIPINE BESYLATE 5 MG PO TABS: 5.0000 mg | ORAL_TABLET | Freq: Every day | ORAL | Status: DC

## 2019-09-23 MED ORDER — LISINOPRIL 20 MG PO TABS
40.0000 mg | ORAL_TABLET | Freq: Every day | ORAL | Status: DC
  Administered 2019-09-23: 40 mg via ORAL
  Filled 2019-09-23: qty 2

## 2019-09-23 MED ORDER — NICARDIPINE HCL IN NACL 20-0.86 MG/200ML-% IV SOLN: 3.0000 mg/h | INTRAVENOUS | Status: DC

## 2019-09-23 MED ORDER — LORAZEPAM 2 MG/ML IJ SOLN
INTRAMUSCULAR | Status: AC
  Filled 2019-09-23: qty 1

## 2019-09-23 MED ORDER — NITROGLYCERIN 0.4 MG SL SUBL: 0.4000 mg | SUBLINGUAL_TABLET | Freq: Once | SUBLINGUAL | Status: DC

## 2019-09-23 MED ORDER — CHLORHEXIDINE GLUCONATE CLOTH 2 % EX PADS
6.0000 | MEDICATED_PAD | Freq: Every day | CUTANEOUS | Status: DC
  Administered 2019-09-23 – 2019-09-26 (×3): 6 via TOPICAL

## 2019-09-23 MED ORDER — AMLODIPINE BESYLATE 10 MG PO TABS
10.0000 mg | ORAL_TABLET | Freq: Every day | ORAL | Status: DC
  Administered 2019-09-23: 10 mg via ORAL
  Filled 2019-09-23: qty 1

## 2019-09-23 MED ORDER — NITROGLYCERIN 0.4 MG SL SUBL
0.4000 mg | SUBLINGUAL_TABLET | SUBLINGUAL | Status: DC | PRN
  Administered 2019-09-23: 0.4 mg via SUBLINGUAL
  Filled 2019-09-23: qty 1

## 2019-09-23 MED ORDER — NITROGLYCERIN 0.4 MG SL SUBL
SUBLINGUAL_TABLET | SUBLINGUAL | Status: AC
  Administered 2019-09-23: 0.4 mg via SUBLINGUAL
  Filled 2019-09-23: qty 1

## 2019-09-23 MED ORDER — LISINOPRIL 10 MG PO TABS: 10.0000 mg | ORAL_TABLET | Freq: Every day | ORAL | Status: DC

## 2019-09-23 MED ORDER — LORAZEPAM 2 MG/ML IJ SOLN
1.0000 mg | Freq: Once | INTRAMUSCULAR | Status: AC
  Administered 2019-09-23: 1 mg via INTRAVENOUS

## 2019-09-23 MED ORDER — HYDRALAZINE HCL 25 MG PO TABS
25.0000 mg | ORAL_TABLET | Freq: Two times a day (BID) | ORAL | Status: DC
  Administered 2019-09-23: 25 mg via ORAL
  Filled 2019-09-23: qty 1

## 2019-09-23 MED ORDER — NICARDIPINE HCL IN NACL 20-0.86 MG/200ML-% IV SOLN
3.0000 mg/h | INTRAVENOUS | Status: DC
  Administered 2019-09-23: 2.5 mg/h via INTRAVENOUS
  Administered 2019-09-23: 5 mg/h via INTRAVENOUS
  Filled 2019-09-23 (×2): qty 200

## 2019-09-23 NOTE — Significant Event (Addendum)
Notified by bedside nurse regarding hypertensive urgency despite multiple antihypertensives.   At bedside, patient is very anxious complaining of chest pain and numb toes bilaterally. BP 188/42  - EKG - Ativan 1 mg IV x 1 - Transfer to SDU  - Cardene drip  Marva Panda, DO  Addendum: EKG NSR with normal axis and without pathologic ST changes on personal read. Patient's son was notified of clinical change by phone.

## 2019-09-23 NOTE — Progress Notes (Signed)
PROGRESS NOTE    Pamela James    Code Status: Full Code  VZD:[id number] DOB: March 16, 1946 DOA: 09/15/2019 LOS: 8 days  PCP: Cindra Eves, MD CC:  Chief Complaint  Patient presents with  . 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  Was admitted on 7/2 with acute on chronic hypoxic respiratory failure and altered mental status in the setting of COPD exacerbation.  She required intubation on admission.  She apparently had been receiving pain medications for pain related to her urologic procedure and had brief loss of pulse and received chest compressions.  Her ICU course was slightly prolonged due to an apparent withdrawal syndrome, possibly due to alcohol but unknown, requiring Precedex drip.  She had an apparent aspiration event as well and is on Unasyn.  She has since been extubated and is off all drip medications and stable for transfer to [facility] service 09/21/2019.  Noted to have dark/cloudy urine on 7/8 and renal US was ordered which showed simple left renal cyst and mild bladder wall thickening posteriorly with a questionable small perinephric fluid collection adjacent to mid right kidney.  CT was ordered for further evaluation which showed left ureteral stent grossly good position without any significant hydronephrosis or obstruction and small nonobstructive renal calculi on the right without any other significant findings. Urology was consulted    A & P   Active Problems:   Acute on chronic respiratory failure with hypoxia (HCC)   1. Acute hypoxic respiratory failure secondary to possible aspiration pneumonia and suspected  atelectasis exacerbated by post CPR anxiety a. Extubated and off sedation, currently on nasal cannula b. Anxious today and nervous to take off her [place] due to her recent traumatic CPR event. - added Ativan 0.5 mg bid PRN c. Hypoxic on ambulation - continue O2 d. Incentive spirometer - illustrated proper technique today e. Completed 7 days Unasyn f. As needed bronchodilators g. Encourage smoking cessation h. OOB i. PCCM on board  2. Bradycardia, resolved a. S/p transient loss of pulse with ROSC, currently off dopamine drip b. Holding beta blocker  3. Hypertensive Urgency a. Restart patient's home hydralazine, lisinopril and amlodipine b. Hold carvedilol due to bradycardia before  4. Change in urine with history of left hydronephrosis s/p left cystoureteroscopy, stent and tumor biopsy at [facility]  History of Left ureteral tumor, improved a. Noted to have dark/cloudy urine on 7/8 and renal US was ordered which showed simple left renal cyst and mild bladder wall thickening posteriorly with a questionable small perinephric fluid collection adjacent to mid right kidney.  CT was ordered for further evaluation which showed left ureteral stent grossly good position without any significant hydronephrosis or obstruction and small nonobstructive renal calculi on the right without any other significant findings b. Holding urology consult  5. AKI, resolved  6. Chest pain, secondary to CPR a. Tylenol PRN b. lidoderm patch  7. Left subclavian DVT a. Changed heparin drip to eliquis  8. Acute metabolic encephalopathy secondary to metabolic derangement, resolved   DVT prophylaxis: SCDs Start: 09/15/19 1703   apixaban (ELIQUIS) tablet 10 mg  apixaban (ELIQUIS) tablet 5 mg   Family Communication: Patient's son has been updated yesterday  Disposition Plan: pending improved BP and home health approval Status is: Inpatient  Remains inpatient appropriate because:Unsafe d/c plan and IV treatments  appropriate due to intensity of illness or inability to take PO   Dispo: The patient is from: Home              Anticipated d/c is to: TBD              Anticipated d/c date is: 2 days              Patient currently is not medically stable to d/c.          Pressure injury documentation    None  Consultants  Urology Cardiology PCCM  Procedures   Intubated-> extubated  Antibiotics   Anti-infectives (From admission, onward)   Start     Dose/Rate Route Frequency Ordered Stop   09/19/19 1000  vancomycin (VANCOCIN) IVPB 1000 mg/200 mL premix  Status:  Discontinued        1,000 mg 200 mL/hr over 60 Minutes Intravenous Every 24 hours 09/18/19 1228 09/19/19 0904   09/19/19 0700  vancomycin (VANCOREADY) IVPB 1250 mg/250 mL  Status:  Discontinued        1,250 mg 166.7 mL/hr over 90 Minutes Intravenous Every 24 hours 09/18/19 0701 09/18/19 1228   09/18/19 0700  vancomycin (VANCOREADY) IVPB 1500 mg/300 mL        1,500 mg 150 mL/hr over 120 Minutes Intravenous  Once 09/18/19 0652 09/18/19 1049   09/17/19 1155  Ampicillin-Sulbactam (UNASYN) 3 g in sodium chloride 0.9 % 100 mL IVPB  Status:  Discontinued        3 g 200 mL/hr over 30 Minutes Intravenous Every 6 hours 09/17/19 1056 09/22/19 1156   09/15/19 1800  Ampicillin-Sulbactam (UNASYN) 3 g in sodium chloride 0.9 % 100 mL IVPB  Status:  Discontinued        3 g 200 mL/hr over 30 Minutes Intravenous Every 12 hours 09/15/19 1758 09/17/19 1056   09/15/19 1545  cefTRIAXone (ROCEPHIN) 1 g in sodium chloride 0.9 % 100 mL IVPB        1 g 200 mL/hr over 30 Minutes Intravenous  Once 09/15/19 1532 09/15/19 1617   09/15/19 1545  azithromycin (ZITHROMAX) 500 mg in sodium chloride 0.9 % 250 mL IVPB        500 mg 250 mL/hr over 60 Minutes Intravenous  Once 09/15/19 1532 09/15/19 1742        Subjective   Patient seen and examined at bedside no acute distress and resting comfortably.  No events overnight.  Tolerating diet.  Denies any  chest pain, fever, nausea, vomiting, urinary complaints.  Otherwise ROS negative    Objective   Vitals:   09/23/19 0600 09/23/19 0750 09/23/19 1131 09/23/19 1147  BP: (!) 183/49   (!) 179/64  Pulse: 77   80  Resp:    (!) 24  Temp:    98.5 F (36.9 C)  TempSrc:    Oral  SpO2:  90% 94% 98%  Weight:      Height:       No intake or output data in the 24 hours ending 09/23/19 1333 Filed Weights   09/21/19 0500 09/22/19 0512 09/23/19 0500  Weight: 63.2 kg 62.1 kg 62 kg    Examination:  Physical Exam Vitals and nursing note reviewed.  Constitutional:      Appearance: Normal appearance.  HENT:     Head: Normocephalic and atraumatic.  Cardiovascular:  Rate and Rhythm: Normal rate and regular rhythm.  Pulmonary:     Effort: Pulmonary effort is normal.     Breath sounds: Normal breath sounds.     Comments: 4L/min nasal canula Abdominal:     General: Abdomen is flat.     Palpations: Abdomen is soft.  Musculoskeletal:        General: No swelling or tenderness.  Skin:    Coloration: Skin is not jaundiced or pale.  Neurological:     Mental Status: She is alert. Mental status is at baseline.  Psychiatric:        Mood and Affect: Mood normal.        Behavior: Behavior normal.     Data Reviewed: I have personally reviewed following labs and imaging studies  CBC: Recent Labs  Lab 09/17/19 0541 09/18/19 0500 09/19/19 0523 09/20/19 0826 09/21/19 0440 09/22/19 0521 09/23/19 0710  WBC 17.9*   < > 18.6* 14.0* 12.1* 12.7* 11.9*  NEUTROABS 14.6*  --   --   --   --   --   --   HGB 8.2*   < > 8.9* 9.3* 9.4* 9.3* 9.1*  HCT 23.5*   < > 25.1* 26.7* 27.0* 27.7* 26.7*  MCV 93.6   < > 89.6 92.1 89.7 94.2 92.7  PLT 369   < > 398 438* 456* 468* 497*   < > = values in this interval not displayed.   Basic Metabolic Panel: Recent Labs  Lab 09/18/19 0500 09/18/19 0500 09/19/19 0523 09/20/19 0826 09/21/19 0440 09/22/19 0521 09/23/19 0710  NA 139   < > 141 141 143 142  142  142  K 3.7   < > 3.4* 3.1* 4.2 3.7  3.6 3.5  CL 99   < > 103 97* 104 108  107 107  CO2 32   < > 27 28 28 26  25 26   GLUCOSE 91   < > 90 92 106* 92  94 100*  BUN 32*   < > 21 25* 25* 20  21 16   CREATININE 1.13*   < > 0.95 1.06* 1.00 0.90  0.86 0.72  CALCIUM 9.1   < > 9.1 9.2 9.4 9.6  9.5 9.7  MG 1.8  --  2.0 2.1 2.1 2.1  --   PHOS 3.0  --  2.2* 4.2 2.9 3.0  --    < > = values in this interval not displayed.   GFR: Estimated Creatinine Clearance: 56.9 mL/min (by C-G formula based on SCr of 0.72 mg/dL). Liver Function Tests: Recent Labs  Lab 09/18/19 0500 09/19/19 0523 09/20/19 0826 09/21/19 0440 09/22/19 0521  AST 60*  --   --   --   --   ALT 116*  --   --   --   --   ALKPHOS 87  --   --   --   --   BILITOT 1.6*  --   --   --   --   PROT 5.9*  --   --   --   --   ALBUMIN 3.0* 2.9* 3.2* 3.1* 3.1*   No results for input(s): LIPASE, AMYLASE in the last 168 hours. No results for input(s): AMMONIA in the last 168 hours. Coagulation Profile: No results for input(s): INR, PROTIME in the last 168 hours. Cardiac Enzymes: No results for input(s): CKTOTAL, CKMB, CKMBINDEX, TROPONINI in the last 168 hours. BNP (last 3 results) No results for input(s): PROBNP in the last 8760 hours.  HbA1C: No results for input(s): HGBA1C in the last 72 hours. CBG: Recent Labs  Lab 09/22/19 2107 09/23/19 0023 09/23/19 0556 09/23/19 0748 09/23/19 1148  GLUCAP 94 101* 96 97 92   Lipid Profile: No results for input(s): CHOL, HDL, LDLCALC, TRIG, CHOLHDL, LDLDIRECT in the last 72 hours. Thyroid Function Tests: No results for input(s): TSH, T4TOTAL, FREET4, T3FREE, THYROIDAB in the last 72 hours. Anemia Panel: No results for input(s): VITAMINB12, FOLATE, FERRITIN, TIBC, IRON, RETICCTPCT in the last 72 hours. Sepsis Labs: Recent Labs  Lab 09/17/19 0541  PROCALCITON 0.38    Recent Results (from the past 240 hour(s))  Culture, blood (routine x 2)     Status: Abnormal   Collection Time:  09/15/19  3:13 PM   Specimen: BLOOD  Result Value Ref Range Status   Specimen Description   Final    BLOOD BLOOD LEFT HAND Performed at [facility], [address], [place], [place] [address]    Special Requests   Final    BOTTLES DRAWN AEROBIC AND ANAEROBIC Blood Culture results may not be optimal due to an excessive volume of blood received in culture bottles Performed at [facility], [address], [place], [place] [address]    Culture  Setup Time   Final    GRAM POSITIVE COCCI ANAEROBIC BOTTLE ONLY CRITICAL RESULT CALLED TO, READ BACK BY AND VERIFIED WITH:  SHEEMA HALLAJI ON 09/16/2019 AT 1214 TIK    Culture (A)  Final    STAPHYLOCOCCUS SPECIES (COAGULASE NEGATIVE) THE SIGNIFICANCE OF ISOLATING THIS ORGANISM FROM A SINGLE SET OF BLOOD CULTURES WHEN MULTIPLE SETS ARE DRAWN IS UNCERTAIN. PLEASE NOTIFY THE MICROBIOLOGY DEPARTMENT WITHIN ONE WEEK IF SPECIATION AND SENSITIVITIES ARE REQUIRED. Performed at [facility], [place] [address] [place], [place] [address]    Report Status 09/19/2019 FINAL  Final  Blood Culture ID Panel (Reflexed)     Status: Abnormal   Collection Time: 09/15/19  3:13 PM  Result Value Ref Range Status   Enterococcus species NOT DETECTED NOT DETECTED Final   Listeria monocytogenes NOT DETECTED NOT DETECTED Final   Staphylococcus species DETECTED (A) NOT DETECTED Final    Comment: Methicillin (oxacillin) susceptible coagulase negative staphylococcus. Possible blood culture contaminant (unless isolated from more than one blood culture draw or clinical case suggests pathogenicity). No antibiotic treatment is indicated for blood  culture contaminants. CRITICAL RESULT CALLED TO, READ BACK BY AND VERIFIED WITH: SHEEMA HALLAJI ON 09/16/2019 AT 1214 TIK    Staphylococcus aureus (BCID) NOT DETECTED NOT DETECTED Final   Methicillin resistance NOT DETECTED NOT DETECTED Final   Streptococcus species NOT DETECTED NOT DETECTED Final    Streptococcus agalactiae NOT DETECTED NOT DETECTED Final   Streptococcus pneumoniae NOT DETECTED NOT DETECTED Final   Streptococcus pyogenes NOT DETECTED NOT DETECTED Final   Acinetobacter baumannii NOT DETECTED NOT DETECTED Final   Enterobacteriaceae species NOT DETECTED NOT DETECTED Final   Enterobacter cloacae complex NOT DETECTED NOT DETECTED Final   Escherichia coli NOT DETECTED NOT DETECTED Final   Klebsiella oxytoca NOT DETECTED NOT DETECTED Final   Klebsiella pneumoniae NOT DETECTED NOT DETECTED Final   Proteus species NOT DETECTED NOT DETECTED Final   Serratia marcescens NOT DETECTED NOT DETECTED Final   Haemophilus influenzae NOT DETECTED NOT DETECTED Final   Neisseria meningitidis NOT DETECTED NOT DETECTED Final   Pseudomonas aeruginosa NOT DETECTED NOT DETECTED Final   Candida albicans NOT DETECTED NOT DETECTED Final   Candida glabrata NOT DETECTED NOT DETECTED Final  Candida krusei NOT DETECTED NOT DETECTED Final   Candida parapsilosis NOT DETECTED NOT DETECTED Final   Candida tropicalis NOT DETECTED NOT DETECTED Final    Comment: Performed at [facility], [address] [place], [place] [address]  Culture, blood (routine x 2)     Status: None   Collection Time: 09/15/19  3:50 PM   Specimen: BLOOD  Result Value Ref Range Status   Specimen Description BLOOD LEFT ARM  Final   Special Requests   Final    BOTTLES DRAWN AEROBIC AND ANAEROBIC Blood Culture adequate volume   Culture   Final    NO GROWTH 5 DAYS Performed at [facility], [address] [place], [place] [address]    Report Status 09/20/2019 FINAL  Final  SARS Coronavirus 2 by RT PCR (hospital order, performed in [facility] hospital lab) Nasopharyngeal Nasopharyngeal Swab     Status: None   Collection Time: 09/15/19  3:55 PM   Specimen: Nasopharyngeal Swab  Result Value Ref Range Status   SARS Coronavirus 2 NEGATIVE NEGATIVE Final    Comment: (NOTE) SARS-CoV-2 target nucleic acids are  NOT DETECTED.  The SARS-CoV-2 RNA is generally detectable in upper and lower respiratory specimens during the acute phase of infection. The lowest concentration of SARS-CoV-2 viral copies this assay can detect is 250 copies / mL. A negative result does not preclude SARS-CoV-2 infection and should not be used as the sole basis for treatment or other patient management decisions.  A negative result may occur with improper specimen collection / handling, submission of specimen other than nasopharyngeal swab, presence of viral mutation(s) within the areas targeted by this assay, and inadequate number of viral copies (<250 copies / mL). A negative result must be combined with clinical observations, patient history, and epidemiological information.  Fact Sheet for Patients:   StrictlyIdeas.no  Fact Sheet for Healthcare Providers: BankingDealers.co.za  This test is not yet approved or  cleared by the [place] FDA and has been authorized for detection and/or diagnosis of SARS-CoV-2 by FDA under an Emergency Use Authorization (EUA).  This EUA will remain in effect (meaning this test can be used) for the duration of the COVID-19 declaration under Section 564(b)(1) of the Act, 21 U.S.C. section 360bbb-3(b)(1), unless the authorization is terminated or revoked sooner.  Performed at [facility], [address] [place], [place] [address]   Urine Culture     Status: None   Collection Time: 09/15/19  6:11 PM   Specimen: Urine, Random  Result Value Ref Range Status   Specimen Description   Final    URINE, RANDOM Performed at [facility], [address] [place], [place] [address]    Special Requests   Final    NONE Performed at [facility], [address], [place], [place] [address]    Culture   Final    NO GROWTH Performed at [facility], [place] [address], [place], [place] [address]    Report  Status 09/17/2019 FINAL  Final  MRSA PCR Screening     Status: None   Collection Time: 09/15/19  6:51 PM   Specimen: Nasal Mucosa; Nasopharyngeal  Result Value Ref Range Status   MRSA by PCR NEGATIVE NEGATIVE Final    Comment:        The GeneXpert MRSA Assay (FDA approved for NASAL specimens only), is one component of a comprehensive MRSA colonization surveillance program. It is not intended to diagnose MRSA infection nor to  guide or monitor treatment for MRSA infections. Performed at [facility], [address] [place], [place] [address]   CULTURE, BLOOD (ROUTINE X 2) w Reflex to ID Panel     Status: None   Collection Time: 09/18/19  9:13 AM   Specimen: BLOOD  Result Value Ref Range Status   Specimen Description BLOOD BLOOD RIGHT HAND  Final   Special Requests   Final    BOTTLES DRAWN AEROBIC AND ANAEROBIC Blood Culture adequate volume   Culture   Final    NO GROWTH 5 DAYS Performed at [facility], [address] [place], [place] [address]    Report Status 09/23/2019 FINAL  Final  CULTURE, BLOOD (ROUTINE X 2) w Reflex to ID Panel     Status: None   Collection Time: 09/18/19  9:45 AM   Specimen: BLOOD  Result Value Ref Range Status   Specimen Description BLOOD BLOOD RIGHT FOREARM  Final   Special Requests   Final    BOTTLES DRAWN AEROBIC AND ANAEROBIC Blood Culture adequate volume   Culture   Final    NO GROWTH 5 DAYS Performed at [facility], [address], [place], [place] [address]    Report Status 09/23/2019 FINAL  Final         Radiology Studies: No results found.      Scheduled Meds: . acidophilus  1 capsule Oral Daily  . amLODipine  5 mg Oral Daily  . apixaban  10 mg Oral BID   Followed by  . [START ON 09/29/2019] apixaban  5 mg Oral BID  . aspirin EC  81 mg Oral Daily  . dicyclomine  10 mg Oral Once  . feeding supplement (ENSURE ENLIVE)  237 mL Oral Q24H  . insulin aspart  0-9 Units Subcutaneous Q4H  .  ipratropium-albuterol  3 mL Nebulization Q4H  . lidocaine  1 patch Transdermal Q24H  . lisinopril  10 mg Oral Daily  . pantoprazole  40 mg Oral Daily   Continuous Infusions: . sodium chloride Stopped (09/18/19 0815)     Time spent: 35 minutes with over 50% of the time coordinating the patient's care    Harold Hedge, DO [facility] Pager [phone]  Call night coverage person covering after 7pm

## 2019-09-23 NOTE — Progress Notes (Addendum)
Patient is c/o numbness to toes and trembling.  VS:  Temp 99.1 HR 91 RR 16 O2 Sat 95% on 6L [place] and B/P 186/42 (80).  Dr. Neysa Bonito notified and ordered EKG and on the way to see patient.  Orders received to transfer patient to SDU.  Report called to Annie Main and patient transferred.

## 2019-09-23 NOTE — Plan of Care (Signed)
Continuing with plan of care. 

## 2019-09-24 ENCOUNTER — Inpatient Hospital Stay: Payer: Medicare HMO

## 2019-09-24 DIAGNOSIS — I161 Hypertensive emergency: Secondary | ICD-10-CM

## 2019-09-24 LAB — GLUCOSE, CAPILLARY
Glucose-Capillary: 101 mg/dL — ABNORMAL HIGH (ref 70–99)
Glucose-Capillary: 108 mg/dL — ABNORMAL HIGH (ref 70–99)
Glucose-Capillary: 112 mg/dL — ABNORMAL HIGH (ref 70–99)
Glucose-Capillary: 122 mg/dL — ABNORMAL HIGH (ref 70–99)
Glucose-Capillary: 125 mg/dL — ABNORMAL HIGH (ref 70–99)
Glucose-Capillary: 125 mg/dL — ABNORMAL HIGH (ref 70–99)
Glucose-Capillary: 126 mg/dL — ABNORMAL HIGH (ref 70–99)
Glucose-Capillary: 87 mg/dL (ref 70–99)
Glucose-Capillary: 92 mg/dL (ref 70–99)
Glucose-Capillary: 95 mg/dL (ref 70–99)

## 2019-09-24 LAB — URINALYSIS, ROUTINE W REFLEX MICROSCOPIC
Bilirubin Urine: NEGATIVE
Glucose, UA: NEGATIVE mg/dL
Ketones, ur: NEGATIVE mg/dL
Nitrite: NEGATIVE
Protein, ur: >=300 mg/dL — AB
RBC / HPF: >50 RBC/hpf — ABNORMAL HIGH (ref 0–5)
Specific Gravity, Urine: 1.018 (ref 1.005–1.030)
WBC, UA: >50 WBC/hpf — ABNORMAL HIGH (ref 0–5)
pH: 6 (ref 5.0–8.0)

## 2019-09-24 LAB — BASIC METABOLIC PANEL
Anion gap: 7 (ref 5–15)
BUN: 17 mg/dL (ref 8–23)
CO2: 24 mmol/L (ref 22–32)
Calcium: 9.5 mg/dL (ref 8.9–10.3)
Chloride: 111 mmol/L (ref 98–111)
Creatinine, Ser: 0.87 mg/dL (ref 0.44–1.00)
GFR calc Af Amer: >60 mL/min (ref >60)
GFR calc non Af Amer: >60 mL/min (ref >60)
Glucose, Bld: 96 mg/dL (ref 70–99)
Potassium: 3.9 mmol/L (ref 3.5–5.1)
Sodium: 142 mmol/L (ref 135–145)

## 2019-09-24 MED ORDER — METOPROLOL TARTRATE 25 MG PO TABS
25.0000 mg | ORAL_TABLET | Freq: Two times a day (BID) | ORAL | Status: DC
  Administered 2019-09-24 – 2019-09-27 (×7): 25 mg via ORAL
  Filled 2019-09-24 (×7): qty 1

## 2019-09-24 MED ORDER — IPRATROPIUM-ALBUTEROL 0.5-2.5 (3) MG/3ML IN SOLN
3.0000 mL | Freq: Four times a day (QID) | RESPIRATORY_TRACT | Status: DC
  Administered 2019-09-25 – 2019-09-26 (×6): 3 mL via RESPIRATORY_TRACT
  Filled 2019-09-24 (×6): qty 3

## 2019-09-24 MED ORDER — FUROSEMIDE 10 MG/ML IJ SOLN
20.0000 mg | Freq: Once | INTRAMUSCULAR | Status: AC
  Administered 2019-09-24: 20 mg via INTRAVENOUS
  Filled 2019-09-24: qty 2

## 2019-09-24 NOTE — Progress Notes (Signed)
PROGRESS NOTE    Pamela James    Code Status: Full Code  LTJ:[id number] DOB: 09-22-1945 DOA: 09/15/2019 LOS: 9 days  PCP: Cindra Eves, MD CC:  Chief Complaint  Patient presents with  . 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  Was admitted on 7/2 with acute on chronic hypoxic respiratory failure and altered mental status in the setting of COPD exacerbation.  She required intubation on admission.  She apparently had been receiving pain medications for pain related to her urologic procedure and had brief loss of pulse and received chest compressions.  Her ICU course was slightly prolonged due to an apparent withdrawal syndrome, possibly due to alcohol but unknown, requiring Precedex drip.  She had an apparent aspiration event as well and is on Unasyn.  She has since been extubated and is off all drip medications and stable for transfer to [facility] service 09/21/2019.  Noted to have dark/cloudy urine on 7/8 and renal US was ordered which showed simple left renal cyst and mild bladder wall thickening posteriorly with a questionable small perinephric fluid collection adjacent to mid right kidney.  CT was ordered for further evaluation which showed left ureteral stent grossly good position without any significant hydronephrosis or obstruction and small nonobstructive renal calculi on the right without any other significant findings.   7/8: transfer out of ICU  7/10: Hypertensive emergency unrelieved by PO and IV antihypertensives. With chest pain but EKG unremarkable. Transferred back to SDU for Cardene drip. Given Nitro x 2 with improved CP.       A & P   Active Problems:   Acute on chronic respiratory failure with hypoxia (HCC)   1. Hypertensive Emergency a. Occurred 7/10: BP 180s/90s but with 8/10 chest pain (unremarkable EKG) and shortness of breath. Received nitro x2 and briefly on Cardene drip, currently off b. CXR showing new left effusion, concern for flash pulmonary edema as well as new righ base dense opacity (possibly effusion vs. Atelectasis vs. Pneumonia)- will give lasix 20 mg IV x 1 c. BP now normalized d. Will give Lopressor 25 mg bid and Lasix 20 mg IV x 1 e. Continue observation in ICU one more night to ensure BP stability  2. Acute hypoxic respiratory failure secondary to possible aspiration pneumonia and suspected atelectasis exacerbated by post CPR anxiety  Flash pulmonary edema  a. Completed 7 days Unasyn b. As needed bronchodilators c. Incentive spirometer  d. Encouraged smoking cessation e. Lasix x 1 f. OOB g. PCCM on board  3. Bradycardia, resolved a. S/p transient loss of pulse with ROSC, currently off dopamine drip b. Restarted beta blocker this AM for tachycardia and hypertension  4. Change in urine with history of left hydronephrosis s/p left cystoureteroscopy, stent and tumor biopsy at [facility]  History of Left ureteral tumor, improved a. Noted to have dark/cloudy urine on 7/8 and renal US was ordered which showed simple left renal cyst and mild bladder wall thickening posteriorly with a questionable small perinephric fluid collection adjacent to mid right kidney.  CT was ordered for further evaluation which showed left ureteral stent grossly good position without any significant hydronephrosis or obstruction and small nonobstructive renal calculi  on the right without any other significant findings b. Holding urology consult c. Repeat UA  5. AKI, resolved  6. Chest pain secondary to CPR and Hypertensive Emergency, improved a. Improved following cardene drip and nitro, now with prior  musculoskeletal chest pain from cpr b. Tylenol PRN c. lidoderm patch  7. Left subclavian DVT a. Changed heparin drip to eliquis  8. History of anxiety, Bipolar disorder, depression a. Family requesting psych consult as they are concerned this is leading to her current and presenting issues b. Psych consulted  9. Acute metabolic encephalopathy secondary to metabolic derangement, resolved   DVT prophylaxis: SCDs Start: 09/15/19 1703   apixaban (ELIQUIS) tablet 10 mg  apixaban (ELIQUIS) tablet 5 mg   Family Communication: Patient's son has been updated yesterday  Disposition Plan: pending improved BP and home health approval Status is: Inpatient  Remains inpatient appropriate because:Unsafe d/c plan and IV treatments appropriate due to intensity of illness or inability to take PO   Dispo: The patient is from: Home              Anticipated d/c is to: TBD              Anticipated d/c date is: 2 days              Patient currently is not medically stable to d/c.          Pressure injury documentation    None  Consultants  Urology Cardiology PCCM  Procedures   Intubated-> extubated  Antibiotics   Anti-infectives (From admission, onward)   Start     Dose/Rate Route Frequency Ordered Stop   09/19/19 1000  vancomycin (VANCOCIN) IVPB 1000 mg/200 mL premix  Status:  Discontinued        1,000 mg 200 mL/hr over 60 Minutes Intravenous Every 24 hours 09/18/19 1228 09/19/19 0904   09/19/19 0700  vancomycin (VANCOREADY) IVPB 1250 mg/250 mL  Status:  Discontinued        1,250 mg 166.7 mL/hr over 90 Minutes Intravenous Every 24 hours 09/18/19 0701 09/18/19 1228   09/18/19 0700  vancomycin (VANCOREADY) IVPB 1500 mg/300 mL        1,500 mg 150 mL/hr over 120 Minutes Intravenous  Once 09/18/19 0652 09/18/19 1049   09/17/19 1155  Ampicillin-Sulbactam (UNASYN) 3 g in sodium chloride 0.9 % 100 mL IVPB  Status:  Discontinued        3 g 200 mL/hr over 30 Minutes Intravenous Every  6 hours 09/17/19 1056 09/22/19 1156   09/15/19 1800  Ampicillin-Sulbactam (UNASYN) 3 g in sodium chloride 0.9 % 100 mL IVPB  Status:  Discontinued        3 g 200 mL/hr over 30 Minutes Intravenous Every 12 hours 09/15/19 1758 09/17/19 1056   09/15/19 1545  cefTRIAXone (ROCEPHIN) 1 g in sodium chloride 0.9 % 100 mL IVPB        1 g 200 mL/hr over 30 Minutes Intravenous  Once 09/15/19 1532 09/15/19 1617   09/15/19 1545  azithromycin (ZITHROMAX) 500 mg in sodium chloride 0.9 % 250 mL IVPB        500 mg 250 mL/hr over 60 Minutes Intravenous  Once 09/15/19 1532 09/15/19 1742        Subjective   Patient seen and examined at bedside, nurse present. Patient has improved symptoms and improved chest pain which just feels sore now. Otherwise doing well and no events overnight.   Objective   Vitals:   09/24/19 1010 09/24/19  1100 09/24/19 1200 09/24/19 1201  BP: 128/64 (!) 150/54 108/60   Pulse: 80 82 69   Resp:  (!) 24 (!) 21   Temp:   98.6 F (37 C)   TempSrc:   Oral   SpO2:  97% 94% 98%  Weight:      Height:        Intake/Output Summary (Last 24 hours) at 09/24/2019 1313 Last data filed at 09/24/2019 1200 Gross per 24 hour  Intake 281.56 ml  Output 50 ml  Net 231.56 ml   Filed Weights   09/21/19 0500 09/22/19 0512 09/23/19 0500  Weight: 63.2 kg 62.1 kg 62 kg    Examination:  Physical Exam Vitals and nursing note reviewed. Exam conducted with a chaperone present.  Constitutional:      General: She is not in acute distress. HENT:     Head: Normocephalic.  Eyes:     Conjunctiva/sclera: Conjunctivae normal.  Cardiovascular:     Rate and Rhythm: Regular rhythm. Tachycardia present.  Pulmonary:     Effort: Pulmonary effort is normal. No respiratory distress.     Breath sounds: No wheezing.  Abdominal:     General: There is no distension.     Tenderness: There is no abdominal tenderness.  Musculoskeletal:        General: No swelling or tenderness.  Neurological:      Mental Status: She is alert. Mental status is at baseline.  Psychiatric:        Mood and Affect: Mood normal.        Behavior: Behavior normal.     Data Reviewed: I have personally reviewed following labs and imaging studies  CBC: Recent Labs  Lab 09/19/19 0523 09/20/19 0826 09/21/19 0440 09/22/19 0521 09/23/19 0710  WBC 18.6* 14.0* 12.1* 12.7* 11.9*  HGB 8.9* 9.3* 9.4* 9.3* 9.1*  HCT 25.1* 26.7* 27.0* 27.7* 26.7*  MCV 89.6 92.1 89.7 94.2 92.7  PLT 398 438* 456* 468* 401*   Basic Metabolic Panel: Recent Labs  Lab 09/18/19 0500 09/18/19 0500 09/19/19 0523 09/19/19 0523 09/20/19 0826 09/21/19 0440 09/22/19 0521 09/23/19 0710 09/24/19 0433  NA 139   < > 141   < > 141 143 142  142 142 142  K 3.7   < > 3.4*   < > 3.1* 4.2 3.7  3.6 3.5 3.9  CL 99   < > 103   < > 97* 104 108  107 107 111  CO2 32   < > 27   < > 28 28 26  25 26 24   GLUCOSE 91   < > 90   < > 92 106* 92  94 100* 96  BUN 32*   < > 21   < > 25* 25* 20  21 16 17   CREATININE 1.13*   < > 0.95   < > 1.06* 1.00 0.90  0.86 0.72 0.87  CALCIUM 9.1   < > 9.1   < > 9.2 9.4 9.6  9.5 9.7 9.5  MG 1.8  --  2.0  --  2.1 2.1 2.1  --   --   PHOS 3.0  --  2.2*  --  4.2 2.9 3.0  --   --    < > = values in this interval not displayed.   GFR: Estimated Creatinine Clearance: 52.3 mL/min (by C-G formula based on SCr of 0.87 mg/dL). Liver Function Tests: Recent Labs  Lab 09/18/19 0500 09/19/19 0272 09/20/19 5366 09/21/19 0440 09/22/19 4403  AST 60*  --   --   --   --   ALT 116*  --   --   --   --   ALKPHOS 87  --   --   --   --   BILITOT 1.6*  --   --   --   --   PROT 5.9*  --   --   --   --   ALBUMIN 3.0* 2.9* 3.2* 3.1* 3.1*   No results for input(s): LIPASE, AMYLASE in the last 168 hours. No results for input(s): AMMONIA in the last 168 hours. Coagulation Profile: No results for input(s): INR, PROTIME in the last 168 hours. Cardiac Enzymes: No results for input(s): CKTOTAL, CKMB, CKMBINDEX, TROPONINI in the  last 168 hours. BNP (last 3 results) No results for input(s): PROBNP in the last 8760 hours. HbA1C: No results for input(s): HGBA1C in the last 72 hours. CBG: Recent Labs  Lab 09/24/19 0001 09/24/19 0358 09/24/19 0706 09/24/19 1104 09/24/19 1202  GLUCAP 92 87 101* 95 125*   Lipid Profile: No results for input(s): CHOL, HDL, LDLCALC, TRIG, CHOLHDL, LDLDIRECT in the last 72 hours. Thyroid Function Tests: No results for input(s): TSH, T4TOTAL, FREET4, T3FREE, THYROIDAB in the last 72 hours. Anemia Panel: No results for input(s): VITAMINB12, FOLATE, FERRITIN, TIBC, IRON, RETICCTPCT in the last 72 hours. Sepsis Labs: No results for input(s): PROCALCITON, LATICACIDVEN in the last 168 hours.  Recent Results (from the past 240 hour(s))  Culture, blood (routine x 2)     Status: Abnormal   Collection Time: 09/15/19  3:13 PM   Specimen: BLOOD  Result Value Ref Range Status   Specimen Description   Final    BLOOD BLOOD LEFT HAND Performed at [facility], [address], [place], [place] [address]    Special Requests   Final    BOTTLES DRAWN AEROBIC AND ANAEROBIC Blood Culture results may not be optimal due to an excessive volume of blood received in culture bottles Performed at [facility], [address], [place], [place] [address]    Culture  Setup Time   Final    GRAM POSITIVE COCCI ANAEROBIC BOTTLE ONLY CRITICAL RESULT CALLED TO, READ BACK BY AND VERIFIED WITH:  SHEEMA HALLAJI ON 09/16/2019 AT 1214 TIK    Culture (A)  Final    STAPHYLOCOCCUS SPECIES (COAGULASE NEGATIVE) THE SIGNIFICANCE OF ISOLATING THIS ORGANISM FROM A SINGLE SET OF BLOOD CULTURES WHEN MULTIPLE SETS ARE DRAWN IS UNCERTAIN. PLEASE NOTIFY THE MICROBIOLOGY DEPARTMENT WITHIN ONE WEEK IF SPECIATION AND SENSITIVITIES ARE REQUIRED. Performed at [facility], [place] [address] [place], [place] [address]    Report Status 09/19/2019 FINAL  Final  Blood Culture ID Panel (Reflexed)     Status:  Abnormal   Collection Time: 09/15/19  3:13 PM  Result Value Ref Range Status   Enterococcus species NOT DETECTED NOT DETECTED Final   Listeria monocytogenes NOT DETECTED NOT DETECTED Final   Staphylococcus species DETECTED (A) NOT DETECTED Final    Comment: Methicillin (oxacillin) susceptible coagulase negative staphylococcus. Possible blood culture contaminant (unless isolated from more than one blood culture draw or clinical case suggests pathogenicity). No antibiotic treatment is indicated for blood  culture contaminants. CRITICAL RESULT CALLED TO, READ BACK BY AND VERIFIED WITH: SHEEMA HALLAJI ON 09/16/2019 AT 1214 TIK    Staphylococcus aureus (BCID) NOT DETECTED NOT DETECTED Final   Methicillin resistance NOT DETECTED NOT DETECTED Final   Streptococcus species NOT DETECTED NOT DETECTED  Final   Streptococcus agalactiae NOT DETECTED NOT DETECTED Final   Streptococcus pneumoniae NOT DETECTED NOT DETECTED Final   Streptococcus pyogenes NOT DETECTED NOT DETECTED Final   Acinetobacter baumannii NOT DETECTED NOT DETECTED Final   Enterobacteriaceae species NOT DETECTED NOT DETECTED Final   Enterobacter cloacae complex NOT DETECTED NOT DETECTED Final   Escherichia coli NOT DETECTED NOT DETECTED Final   Klebsiella oxytoca NOT DETECTED NOT DETECTED Final   Klebsiella pneumoniae NOT DETECTED NOT DETECTED Final   Proteus species NOT DETECTED NOT DETECTED Final   Serratia marcescens NOT DETECTED NOT DETECTED Final   Haemophilus influenzae NOT DETECTED NOT DETECTED Final   Neisseria meningitidis NOT DETECTED NOT DETECTED Final   Pseudomonas aeruginosa NOT DETECTED NOT DETECTED Final   Candida albicans NOT DETECTED NOT DETECTED Final   Candida glabrata NOT DETECTED NOT DETECTED Final   Candida krusei NOT DETECTED NOT DETECTED Final   Candida parapsilosis NOT DETECTED NOT DETECTED Final   Candida tropicalis NOT DETECTED NOT DETECTED Final    Comment: Performed at [facility], [address] [place], [place] [address]  Culture, blood (routine x 2)     Status: None   Collection Time: 09/15/19  3:50 PM   Specimen: BLOOD  Result Value Ref Range Status   Specimen Description BLOOD LEFT ARM  Final   Special Requests   Final    BOTTLES DRAWN AEROBIC AND ANAEROBIC Blood Culture adequate volume   Culture   Final    NO GROWTH 5 DAYS Performed at [facility], [address], [place], [place] [address]    Report Status 09/20/2019 FINAL  Final  SARS Coronavirus 2 by RT PCR (hospital order, performed in [facility] hospital lab) Nasopharyngeal Nasopharyngeal Swab     Status: None   Collection Time: 09/15/19  3:55 PM   Specimen: Nasopharyngeal Swab  Result Value Ref Range Status   SARS Coronavirus 2 NEGATIVE NEGATIVE Final    Comment: (NOTE) SARS-CoV-2 target nucleic acids are NOT DETECTED.  The SARS-CoV-2 RNA is generally detectable in upper and lower respiratory specimens during the acute phase of infection. The lowest concentration of SARS-CoV-2 viral copies this assay can detect is 250 copies / mL. A negative result does not preclude SARS-CoV-2 infection and should not be used as the sole basis for treatment or other patient management decisions.  A negative result may occur with improper specimen collection / handling, submission of specimen other than nasopharyngeal swab, presence of viral mutation(s) within the areas targeted by this assay, and inadequate number of viral copies (<250 copies / mL). A negative result must be combined with clinical observations, patient history, and epidemiological information.  Fact Sheet for Patients:   StrictlyIdeas.no  Fact Sheet for Healthcare Providers: BankingDealers.co.za  This test is not yet approved or  cleared by the [place] FDA and has been authorized for detection and/or diagnosis of SARS-CoV-2 by FDA under an Emergency Use Authorization (EUA).  This  EUA will remain in effect (meaning this test can be used) for the duration of the COVID-19 declaration under Section 564(b)(1) of the Act, 21 U.S.C. section 360bbb-3(b)(1), unless the authorization is terminated or revoked sooner.  Performed at [facility], [address] [place], [place] [address]   Urine Culture     Status: None   Collection Time: 09/15/19  6:11 PM   Specimen: Urine, Random  Result Value Ref Range Status   Specimen Description   Final    URINE, RANDOM Performed  at [facility], [address], [place], [place] [address]    Special Requests   Final    NONE Performed at [facility], [address], [place], [place] [address]    Culture   Final    NO GROWTH Performed at [facility], [place] [address], [place], [place] [address]    Report Status 09/17/2019 FINAL  Final  MRSA PCR Screening     Status: None   Collection Time: 09/15/19  6:51 PM   Specimen: Nasal Mucosa; Nasopharyngeal  Result Value Ref Range Status   MRSA by PCR NEGATIVE NEGATIVE Final    Comment:        The GeneXpert MRSA Assay (FDA approved for NASAL specimens only), is one component of a comprehensive MRSA colonization surveillance program. It is not intended to diagnose MRSA infection nor to guide or monitor treatment for MRSA infections. Performed at [facility], [address] [place], [place] [address]   CULTURE, BLOOD (ROUTINE X 2) w Reflex to ID Panel     Status: None   Collection Time: 09/18/19  9:13 AM   Specimen: BLOOD  Result Value Ref Range Status   Specimen Description BLOOD BLOOD RIGHT HAND  Final   Special Requests   Final    BOTTLES DRAWN AEROBIC AND ANAEROBIC Blood Culture adequate volume   Culture   Final    NO GROWTH 5 DAYS Performed at [facility], [address] [place], [place] [address]    Report Status 09/23/2019 FINAL  Final  CULTURE, BLOOD (ROUTINE X 2) w Reflex to ID Panel     Status: None    Collection Time: 09/18/19  9:45 AM   Specimen: BLOOD  Result Value Ref Range Status   Specimen Description BLOOD BLOOD RIGHT FOREARM  Final   Special Requests   Final    BOTTLES DRAWN AEROBIC AND ANAEROBIC Blood Culture adequate volume   Culture   Final    NO GROWTH 5 DAYS Performed at [facility], [address], [place], [place] [address]    Report Status 09/23/2019 FINAL  Final         Radiology Studies: [facility] Chest Port 1 View  Result Date: 09/24/2019 CLINICAL DATA:  Acute respiratory failure EXAM: PORTABLE CHEST 1 VIEW COMPARISON:  September 17, 2019 FINDINGS: No pneumothorax. The heart size is borderline. The hila and mediastinum are unchanged. New layering effusion with underlying atelectasis on the left. New dense opacity in the right base. No other acute abnormalities. IMPRESSION: 1. New layering effusion on the left. 2. New dense opacity in the right base may represent effusion with underlying atelectasis or pneumonia. Recommend short-term follow-up imaging to ensure resolution. Electronically Signed   By: Dorise Bullion III M.D   On: 09/24/2019 10:11        Scheduled Meds: . acidophilus  1 capsule Oral Daily  . apixaban  10 mg Oral BID   Followed by  . [START ON 09/29/2019] apixaban  5 mg Oral BID  . aspirin EC  81 mg Oral Daily  . Chlorhexidine Gluconate Cloth  6 each Topical Daily  . dicyclomine  10 mg Oral Once  . feeding supplement (ENSURE ENLIVE)  237 mL Oral Q24H  . insulin aspart  0-9 Units Subcutaneous Q4H  . ipratropium-albuterol  3 mL Nebulization Q4H  . lidocaine  1 patch Transdermal Q24H  . metoprolol tartrate  25 mg Oral BID  . pantoprazole  40 mg Oral Daily  Continuous Infusions: . sodium chloride Stopped (09/18/19 0815)  . niCARDipine Stopped (09/23/19 2253)     Time spent: 36 minutes with over 50% of the time coordinating the patient's care    Harold Hedge, DO [facility] Pager [phone]  Call night coverage person  covering after 7pm

## 2019-09-24 NOTE — Progress Notes (Signed)
PT Cancellation Note  Patient Details Name: Pamela James MRN: [id number] DOB: Oct 12, 1945   Cancelled Treatment:    Reason Eval/Treat Not Completed: Medical issues which prohibited therapy (Per chart review, patient transferred to CCU for medical management of hypertensive urgency.  Per guidelines, will require new orders to resume PT services after transfer to higher level of care.  Initial order completed; please re-consult as appropriate)   Kimbly Eanes H. Owens Shark, PT, DPT, NCS 09/24/19, 5:38 PM [phone]

## 2019-09-24 NOTE — Plan of Care (Signed)
Pt. BP WNL

## 2019-09-25 DIAGNOSIS — R310 Gross hematuria: Secondary | ICD-10-CM

## 2019-09-25 LAB — GLUCOSE, CAPILLARY
Glucose-Capillary: 101 mg/dL — ABNORMAL HIGH (ref 70–99)
Glucose-Capillary: 98 mg/dL (ref 70–99)
Glucose-Capillary: 93 mg/dL (ref 70–99)
Glucose-Capillary: 103 mg/dL — ABNORMAL HIGH (ref 70–99)
Glucose-Capillary: 111 mg/dL — ABNORMAL HIGH (ref 70–99)

## 2019-09-25 LAB — CBC
HCT: 26.7 % — ABNORMAL LOW (ref 36.0–46.0)
Hemoglobin: 9.1 g/dL — ABNORMAL LOW (ref 12.0–15.0)
MCH: 31.4 pg (ref 26.0–34.0)
MCHC: 34.1 g/dL (ref 30.0–36.0)
MCV: 92.1 fL (ref 80.0–100.0)
Platelets: 623 10*3/uL — ABNORMAL HIGH (ref 150–400)
RBC: 2.9 MIL/uL — ABNORMAL LOW (ref 3.87–5.11)
RDW: 15.9 % — ABNORMAL HIGH (ref 11.5–15.5)
WBC: 10.3 10*3/uL (ref 4.0–10.5)
nRBC: 0 % (ref 0.0–0.2)

## 2019-09-25 LAB — BASIC METABOLIC PANEL
Anion gap: 6 (ref 5–15)
BUN: 26 mg/dL — ABNORMAL HIGH (ref 8–23)
CO2: 25 mmol/L (ref 22–32)
Calcium: 9.8 mg/dL (ref 8.9–10.3)
Chloride: 108 mmol/L (ref 98–111)
Creatinine, Ser: 0.81 mg/dL (ref 0.44–1.00)
GFR calc Af Amer: >60 mL/min (ref >60)
GFR calc non Af Amer: >60 mL/min (ref >60)
Glucose, Bld: 94 mg/dL (ref 70–99)
Potassium: 4 mmol/L (ref 3.5–5.1)
Sodium: 139 mmol/L (ref 135–145)

## 2019-09-25 MED ORDER — FUROSEMIDE 10 MG/ML IJ SOLN
20.0000 mg | Freq: Once | INTRAMUSCULAR | Status: AC
  Administered 2019-09-25: 20 mg via INTRAVENOUS
  Filled 2019-09-25: qty 2

## 2019-09-25 MED ORDER — INSULIN ASPART 100 UNIT/ML ~~LOC~~ SOLN: 0.0000 [IU] | Freq: Three times a day (TID) | SUBCUTANEOUS | Status: DC

## 2019-09-25 MED ORDER — INSULIN ASPART 100 UNIT/ML ~~LOC~~ SOLN: 0.0000 [IU] | Freq: Every day | SUBCUTANEOUS | Status: DC

## 2019-09-25 NOTE — Care Management Important Message (Signed)
Important Message  Patient Details  Name: Pamela James MRN: [id number] Date of Birth: 17-May-1945   Medicare Important Message Given:  Yes     Dannette Barbara 09/25/2019, 12:09 PM

## 2019-09-25 NOTE — Progress Notes (Signed)
Patient seen and examined.  Full consult to follow.

## 2019-09-25 NOTE — Progress Notes (Signed)
Speech Language Pathology Treatment: Dysphagia  Patient Details Name: Pamela James MRN: [id number] DOB: [birth year] Today's Date: 09/25/2019 Time: 4920-1007 SLP Time Calculation (min) (ACUTE ONLY): 25 min  Assessment / Plan / Recommendation Clinical Impression  Pt seen for ongoing assessment of swallowing and toleration of diet; education re: swallowing, diet consistency rec'd, and general aspiration precautions. Pt does have a Baseline of GERD - general precautions rec'd as well.  Discussed w/ pt the current diet consistency of minced foods for ease of oral intake, mastication -- gumming and mashing d/t Edentulous status. Pt stated her appetite seemed "better since the doctor gave me that pill for my appetite". She consumed sips of thin liquids via Cup w/ no overt s/s of aspiration noted while in room; no decline in vocal quality or respiratory status(pt still has a deep congested cough at baseline prior to po's). Talked w/ pt about reducing her risk for aspiration general aspiration precautions to include Small sips Slowly, No straws if increased coughing noted, and Sit fully Upright w/ all oral intake. Recommended reducing distractions including Talking at meals. Also discussed food consistencies and options, preparation, and use of condiments to soften foods if eating w/out Dentures. Encouraged pt to breakdown the foods well and moisten using gravies b/f eating not only for easier oropharyngeal phase of swallowing but also the Esophageal phase d/t her Baseline of GERD. Pt agreed. She endorsed certain foods at home (cooked) were easy to eat foods w/out Dentures.  The above was discussed w/ NSG; Handouts on diet consistency recommended as well as aspiration precautions and Pill swallowing options discussed. Handouts on general Reflux precautions; Esophageal motility strategies. Discussed the benefit of the MINCED Meats consistency diet for easier Esophageal clearing and for ease of oral phase management.  Rec'd gravies to moisten foods; soups.   Recommend continue a Dysphagia level 2 (minced foods) diet w/ Thin liquids; aspiration precautions; REFLUX precautions; Pill in Puree for safer swallowing. Pt agreed stating the ease of the diet also. ST services will sign off at this time as pt appears at her Baseline w/ oropharyngeal phase swallowing; Education completed w/ pt (and w/ family at other visits - Daughter). NSG to reconsult if new needs arise while admitted. Pt and NSG agreed.      HPI HPI: Pt is a 74 year old who presented with acute on chronic respiratory failure and severe metabolic abnormalities.  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  It appears she had received some narcotic medication for pain.  She was noted to be in respiratory distress and failure on arrival to the emergency room.  She was clearly altered and in significant distress and required emergent intubation.  Subsequently she had a brief episode of arrest where she was noted to be bradycardic and hypotensive.  She has severe metabolic abnormalities to include severe hyperkalemia.  She also has what appears to be acute on chronic renal failure.  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 She had reportedly been doing well since her procedure, but had been taking oxycodone regularly for pain.  Pt has a past medical history of hypertension, COPD, anxiety/depression, and GERD.  Dtr present, and pt, denied any swallowing problems at home.  Per MD note, Seizure like activity noted; pt started on Keppra. Pt was briefly orally intubated x2 days~.  SLP Plan  All goals met       Recommendations  Diet recommendations: Dysphagia 2 (fine chop);Thin liquid (w/ upgrade to more mech soft style diet she uses at home) Liquids provided via:  Cup;Straw Medication Administration: Whole meds with puree (for safer swallowing; ease of swallowing) Supervision: Patient able to self feed;Intermittent supervision to cue for compensatory strategies Compensations: Minimize environmental distractions;Slow rate;Small sips/bites;Lingual sweep for clearance of pocketing;Follow solids with liquid Postural Changes and/or Swallow Maneuvers: Seated upright 90 degrees;Upright 30-60 min after meal                General recommendations:  (n/a) Oral Care Recommendations: Oral care BID;Oral care before and after PO;Patient independent with oral care Follow up Recommendations: None SLP Visit Diagnosis: Dysphagia, unspecified (R13.10) (edentulous status ) Plan: All goals met       GO                 Orinda Kenner, MS, CCC-SLP Fredda Clarida 09/25/2019, 1:45 PM

## 2019-09-25 NOTE — Consult Note (Signed)
Urology Consult  Requesting physician: Marva Panda, MD  Reason for consultation: Gross hematuria  Chief Complaint: N/A  History of Present Illness: Pamela James is a 74 y.o. admitted 09/15/2019 with acute on chronic respiratory failure.  Most recently followed by Dr. Kandee Keen, [facility].   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  Left ureteroscopy 08/10/2019 with findings of a 4 cm hypertrophic appearing tissue in the left mid ureter felt consistent with a urothelial tumor  Underwent biopsy and left ureteral stent placement  Pathology inconclusive with repeat ureteroscopy recommended  Follow-up ureteroscopy 09/12/2019; retrograde pyelogram with long filling defect left mid ureter and significant hydroureteronephrosis above the tumor  Intraoperative findings with significant hypertrophic tissue suspicious for urothelial tumor measuring ~5 cm in length; cytology and brush biopsies obtained in addition to multiple tissue biopsies; left ureteral stent replaced  Left ureteral washing negative for high-grade urothelial carcinoma; left ureteral brushing atypical cells/inconclusive for high-grade urothelial carcinoma  Tissue biopsy results from this date are not in Epic    Has had intermittent hematuria through the hospitalization  Complains of dysuria, frequency and urgency  Currently on Eliquis  Past Medical History:  Diagnosis Date  . Anxiety   . Asthma   . COPD (chronic obstructive pulmonary disease) ([place])   . Depression   . GERD (gastroesophageal reflux disease)   . Hypertension     Past Surgical History:  Procedure Laterality Date  . ABDOMINAL HYSTERECTOMY    . TOTAL HIP ARTHROPLASTY Left   . TUBAL LIGATION      Home Medications:  Current Meds  Medication Sig  . albuterol (PROVENTIL HFA;VENTOLIN HFA) 108 (90 Base) MCG/ACT inhaler Inhale  2 puffs into the lungs every 6 (six) hours as needed for wheezing or shortness of breath.  Marland Kitchen albuterol (PROVENTIL) (2.5 MG/3ML) 0.083% nebulizer solution Take 2.5 mg by nebulization every 6 (six) hours as needed for wheezing or shortness of breath.  Marland Kitchen alendronate (FOSAMAX) 70 MG tablet Take 70 mg by mouth once a week. Take with a full glass of water on an empty stomach.  Marland Kitchen amLODipine (NORVASC) 10 MG tablet Take 10 mg by mouth daily.  Marland Kitchen aspirin EC 81 MG EC tablet Take 1 tablet (81 mg total) by mouth daily.  . carvedilol (COREG) 12.5 MG tablet Take 1 tablet (12.5 mg total) by mouth 2 (two) times daily with a meal.  . diphenhydrAMINE (BENADRYL) 25 mg capsule Take 25 mg by mouth every 6 (six) hours as needed for itching or allergies.  . fluticasone-salmeterol (ADVAIR HFA) 230-21 MCG/ACT inhaler Inhale 2 puffs into the lungs 2 (two) times daily.  Marland Kitchen gabapentin (NEURONTIN) 300 MG capsule Take 300 mg by mouth 2 (two) times daily.   . hydrALAZINE (APRESOLINE) 25 MG tablet Take 25 mg by mouth in the morning and at bedtime.  . Ipratropium-Albuterol (COMBIVENT) 20-100 MCG/ACT AERS respimat Inhale 1 puff into the lungs 4 (four) times daily.  Marland Kitchen lisinopril (PRINIVIL,ZESTRIL) 40 MG tablet Take 40 mg by mouth daily.  Marland Kitchen omeprazole (PRILOSEC) 20 MG capsule Take 20 mg by mouth daily.  . ondansetron (ZOFRAN-ODT) 4 MG disintegrating tablet Take 4 mg by mouth every 8 (eight) hours as needed for nausea or vomiting.  . pravastatin (PRAVACHOL) 20 MG tablet Take 20 mg by mouth at bedtime.   . tamsulosin (FLOMAX) 0.4 MG CAPS capsule Take 0.4 mg by mouth daily.  Marland Kitchen  vitamin B-12 (CYANOCOBALAMIN) 1000 MCG tablet Take 1,000 mcg by mouth daily.    Allergies:  Allergies  Allergen Reactions  . Aspirin Other (See Comments)    GI ulcers  . Morphine And Related Other (See Comments)    Delerium, itching  . Nsaids Other (See Comments)    GI ulcers    Family History  Problem Relation Age of Onset  . Heart disease Father   .  Heart disease Sister     Social History:  reports that she has been smoking. She has never used smokeless tobacco. She reports that she does not drink alcohol and does not use drugs.  ROS: A complete review of systems was performed.  All systems are negative except for pertinent findings as noted.  Physical Exam:  Vital signs in last 24 hours: Temp:  [98.2 F (36.8 C)-99.8 F (37.7 C)] 98.9 F (37.2 C) (07/12 1548) Pulse Rate:  [58-78] 66 (07/12 1548) Resp:  [16-27] 17 (07/12 1548) BP: (110-162)/(44-58) 162/49 (07/12 1548) SpO2:  [91 %-100 %] 91 % (07/12 1555) Weight:  [62.9 kg] 62.9 kg (07/12 0209) Constitutional:  Alert and oriented, No acute distress HEENT: [place] AT, moist mucus membranes.  Trachea midline, no masses Cardiovascular: Regular rate and rhythm, no clubbing, cyanosis, or edema. Respiratory: Normal respiratory effort, lungs clear bilaterally GI: Abdomen is soft, nontender, nondistended, no abdominal masses.  Currently using Purewick and darker urine in canister   Impression/Assessment:  74 y.o. female with a left ureteral tumor status post ureteroscopy/biopsy x2.  Her last op note indicates multiple biopsies were taken of the mass however the only report in Epic are cytology results.  Hematuria most likely secondary to her indwelling stent and potential bleeding from her ureteral tumor.  Recommendation:  Needs follow-up with [facility] after discharge   09/25/2019, 4:04 PM  Pamela Giovanni,  MD

## 2019-09-25 NOTE — Plan of Care (Signed)
Weaning pt off supplemental oxygen.  Up to [facility] for BM; tolerated well.  Denies SOB, CP, N/V.  Good appetite, and consuming 50-75% of meals.    Problem: Education: Goal: Knowledge of General Education information will improve Description: Including pain rating scale, medication(s)/side effects and non-pharmacologic comfort measures Outcome: Progressing   Problem: Health Behavior/Discharge Planning: Goal: Ability to manage health-related needs will improve Outcome: Progressing   Problem: Clinical Measurements: Goal: Ability to maintain clinical measurements within normal limits will improve Outcome: Progressing Goal: Will remain free from infection Outcome: Progressing Goal: Diagnostic test results will improve Outcome: Progressing Goal: Respiratory complications will improve Outcome: Progressing Goal: Cardiovascular complication will be avoided Outcome: Progressing   Problem: Activity: Goal: Risk for activity intolerance will decrease Outcome: Progressing   Problem: Nutrition: Goal: Adequate nutrition will be maintained Outcome: Progressing   Problem: Coping: Goal: Level of anxiety will decrease Outcome: Progressing   Problem: Pain Managment: Goal: General experience of comfort will improve Outcome: Progressing   Problem: Safety: Goal: Ability to remain free from injury will improve Outcome: Progressing   Problem: Skin Integrity: Goal: Risk for impaired skin integrity will decrease Outcome: Progressing

## 2019-09-25 NOTE — Progress Notes (Signed)
PROGRESS NOTE    Pamela James    Code Status: Full Code  KDX:[id number] DOB: 05/07/1945 DOA: 09/15/2019 LOS: 10 days  PCP: Cindra Eves, MD CC:  Chief Complaint  Patient presents with  . 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  Was admitted on 7/2 with acute on chronic hypoxic respiratory failure and altered mental status in the setting of COPD exacerbation.  She required intubation on admission.  She apparently had been receiving pain medications for pain related to her urologic procedure and had brief loss of pulse and received chest compressions.  Her ICU course was slightly prolonged due to an apparent withdrawal syndrome, possibly due to alcohol but unknown, requiring Precedex drip.  She had an apparent aspiration event as well and is on Unasyn.  She has since been extubated and is off all drip medications and stable for transfer to [facility] service 09/21/2019.  Noted to have dark/cloudy urine on 7/8 and renal US was ordered which showed simple left renal cyst and mild bladder wall thickening posteriorly with a questionable small perinephric fluid collection adjacent to mid right kidney.  CT was ordered for further evaluation which showed left ureteral stent grossly good position without any significant hydronephrosis or obstruction and small nonobstructive renal calculi on the right without any other significant findings.   7/8: transfer out of ICU  7/10: Hypertensive emergency unrelieved by PO and IV antihypertensives. With chest pain but EKG unremarkable. Transferred back to SDU for Cardene drip. Given Nitro x 2 with improved CP.  CXR showing new left effusion, concern for flash pulmonary edema as well as new righ base dense opacity (possibly effusion vs. Atelectasis vs. Pneumonia) given lasix.     A & P   Active Problems:   Acute on chronic respiratory failure with hypoxia (HCC)   1. Hypertensive Emergency resolved a. Continue Lopressor twice daily  2. Acute hypoxic respiratory failure secondary to possible aspiration pneumonia and suspected atelectasis exacerbated by post CPR anxiety  Flash pulmonary edema  a. Completed 7 days Unasyn b. As needed bronchodilators c. Incentive spirometer  d. Encouraged smoking cessation e. Improved with Lasix dose, additional Lasix today f. OOB g. PCCM on board  3. Bradycardia, resolved a. S/p transient loss of pulse with ROSC, currently off dopamine drip b. Restarted beta blocker this AM for tachycardia and hypertension  4. Change in urine with history of left hydronephrosis s/p left cystoureteroscopy, stent and tumor biopsy at [facility]  History of Left ureteral tumor, improved a. Noted to have dark/cloudy urine on 7/8 and renal US was ordered which showed simple left renal cyst and mild bladder wall thickening posteriorly with a questionable small perinephric fluid collection adjacent to mid right kidney.  CT was ordered for further evaluation which showed left ureteral stent grossly good position without any significant hydronephrosis or obstruction and small nonobstructive renal calculi on the right without any other significant findings b. Hematuria today, urology consulted  5. AKI, resolved  6. Chest pain secondary to CPR and Hypertensive Emergency, improved a. Improved following cardene drip and nitro, now with prior musculoskeletal chest pain from cpr b. Tylenol PRN c. lidoderm patch  7. Left subclavian DVT  a. Continue Eliquis for at least 3 months  8. History of anxiety, Bipolar disorder, depression a. Family requesting psych consult as they are concerned this is  leading to her current and presenting issues b. Psych consulted  9. Acute metabolic encephalopathy secondary to metabolic derangement, resolved   DVT prophylaxis: SCDs Start: 09/15/19 1703   apixaban (ELIQUIS) tablet 10 mg  apixaban (ELIQUIS) tablet 5 mg   Family Communication: Patient's son has been updated at bedside yesterday  Disposition Plan: pending neurology consult, improve respiratory status and home health approval Status is: Inpatient  Remains inpatient appropriate because:Unsafe d/c plan and Inpatient level of care appropriate due to severity of illness   Dispo: The patient is from: Home              Anticipated d/c is to: Home              Anticipated d/c date is: 2 days              Patient currently is not medically stable to d/c.          Pressure injury documentation    None  Consultants  Urology Cardiology PCCM  Procedures   Intubated-> extubated  Antibiotics   Anti-infectives (From admission, onward)   Start     Dose/Rate Route Frequency Ordered Stop   09/19/19 1000  vancomycin (VANCOCIN) IVPB 1000 mg/200 mL premix  Status:  Discontinued        1,000 mg 200 mL/hr over 60 Minutes Intravenous Every 24 hours 09/18/19 1228 09/19/19 0904   09/19/19 0700  vancomycin (VANCOREADY) IVPB 1250 mg/250 mL  Status:  Discontinued        1,250 mg 166.7 mL/hr over 90 Minutes Intravenous Every 24 hours 09/18/19 0701 09/18/19 1228   09/18/19 0700  vancomycin (VANCOREADY) IVPB 1500 mg/300 mL        1,500 mg 150 mL/hr over 120 Minutes Intravenous  Once 09/18/19 0652 09/18/19 1049   09/17/19 1155  Ampicillin-Sulbactam (UNASYN) 3 g in sodium chloride 0.9 % 100 mL IVPB  Status:  Discontinued        3 g 200 mL/hr over 30 Minutes Intravenous Every 6 hours 09/17/19 1056 09/22/19 1156   09/15/19 1800  Ampicillin-Sulbactam (UNASYN) 3 g in sodium chloride 0.9 % 100 mL IVPB  Status:  Discontinued        3 g 200 mL/hr over 30 Minutes Intravenous Every 12 hours 09/15/19  1758 09/17/19 1056   09/15/19 1545  cefTRIAXone (ROCEPHIN) 1 g in sodium chloride 0.9 % 100 mL IVPB        1 g 200 mL/hr over 30 Minutes Intravenous  Once 09/15/19 1532 09/15/19 1617   09/15/19 1545  azithromycin (ZITHROMAX) 500 mg in sodium chloride 0.9 % 250 mL IVPB        500 mg 250 mL/hr over 60 Minutes Intravenous  Once 09/15/19 1532 09/15/19 1742        Subjective   Doing well today and in good spirits making jokes.  Very happy with her care center.  No complaints and no overnight events.  Objective   Vitals:   09/25/19 0815 09/25/19 1146 09/25/19 1300 09/25/19 1310  BP:  (!) 154/52    Pulse:  (!) 58    Resp:  17    Temp:  98.2 F (36.8 C)    TempSrc:  Oral    SpO2: 96% 100% 99% 97%  Weight:      Height:  Intake/Output Summary (Last 24 hours) at 09/25/2019 1517 Last data filed at 09/25/2019 0950 Gross per 24 hour  Intake 1160 ml  Output 800 ml  Net 360 ml   Filed Weights   09/22/19 0512 09/23/19 0500 09/25/19 0209  Weight: 62.1 kg 62 kg 62.9 kg    Examination:  Physical Exam Vitals and nursing note reviewed.  Constitutional:      Appearance: Normal appearance.  HENT:     Head: Normocephalic and atraumatic.  Eyes:     Conjunctiva/sclera: Conjunctivae normal.  Cardiovascular:     Rate and Rhythm: Normal rate and regular rhythm.  Pulmonary:     Effort: Pulmonary effort is normal.     Breath sounds: Normal breath sounds.  Abdominal:     General: Abdomen is flat.     Palpations: Abdomen is soft.  Genitourinary:    Comments: Urine very dark/red Musculoskeletal:        General: No swelling or tenderness.  Skin:    Coloration: Skin is not jaundiced or pale.  Neurological:     Mental Status: She is alert. Mental status is at baseline.  Psychiatric:        Mood and Affect: Mood normal.        Behavior: Behavior normal.     Data Reviewed: I have personally reviewed following labs and imaging studies  CBC: Recent Labs  Lab 09/20/19 0826  09/21/19 0440 09/22/19 0521 09/23/19 0710 09/25/19 0730  WBC 14.0* 12.1* 12.7* 11.9* 10.3  HGB 9.3* 9.4* 9.3* 9.1* 9.1*  HCT 26.7* 27.0* 27.7* 26.7* 26.7*  MCV 92.1 89.7 94.2 92.7 92.1  PLT 438* 456* 468* 497* 924*   Basic Metabolic Panel: Recent Labs  Lab 09/19/19 0523 09/19/19 0523 09/20/19 0826 09/20/19 0826 09/21/19 0440 09/22/19 0521 09/23/19 0710 09/24/19 0433 09/25/19 0730  NA 141   < > 141   < > 143 142  142 142 142 139  K 3.4*   < > 3.1*   < > 4.2 3.7  3.6 3.5 3.9 4.0  CL 103   < > 97*   < > 104 108  107 107 111 108  CO2 27   < > 28   < > 28 26  25 26 24 25   GLUCOSE 90   < > 92   < > 106* 92  94 100* 96 94  BUN 21   < > 25*   < > 25* 20  21 16 17  26*  CREATININE 0.95   < > 1.06*   < > 1.00 0.90  0.86 0.72 0.87 0.81  CALCIUM 9.1   < > 9.2   < > 9.4 9.6  9.5 9.7 9.5 9.8  MG 2.0  --  2.1  --  2.1 2.1  --   --   --   PHOS 2.2*  --  4.2  --  2.9 3.0  --   --   --    < > = values in this interval not displayed.   GFR: Estimated Creatinine Clearance: 53.6 mL/min (by C-G formula based on SCr of 0.81 mg/dL). Liver Function Tests: Recent Labs  Lab 09/19/19 0523 09/20/19 0826 09/21/19 0440 09/22/19 0521  ALBUMIN 2.9* 3.2* 3.1* 3.1*   No results for input(s): LIPASE, AMYLASE in the last 168 hours. No results for input(s): AMMONIA in the last 168 hours. Coagulation Profile: No results for input(s): INR, PROTIME in the last 168 hours. Cardiac Enzymes: No results for input(s): CKTOTAL, CKMB, CKMBINDEX, TROPONINI  in the last 168 hours. BNP (last 3 results) No results for input(s): PROBNP in the last 8760 hours. HbA1C: No results for input(s): HGBA1C in the last 72 hours. CBG: Recent Labs  Lab 09/24/19 1932 09/24/19 2350 09/25/19 0510 09/25/19 0743 09/25/19 1147  GLUCAP 122* 112* 101* 98 93   Lipid Profile: No results for input(s): CHOL, HDL, LDLCALC, TRIG, CHOLHDL, LDLDIRECT in the last 72 hours. Thyroid Function Tests: No results for input(s):  TSH, T4TOTAL, FREET4, T3FREE, THYROIDAB in the last 72 hours. Anemia Panel: No results for input(s): VITAMINB12, FOLATE, FERRITIN, TIBC, IRON, RETICCTPCT in the last 72 hours. Sepsis Labs: No results for input(s): PROCALCITON, LATICACIDVEN in the last 168 hours.  Recent Results (from the past 240 hour(s))  Culture, blood (routine x 2)     Status: None   Collection Time: 09/15/19  3:50 PM   Specimen: BLOOD  Result Value Ref Range Status   Specimen Description BLOOD LEFT ARM  Final   Special Requests   Final    BOTTLES DRAWN AEROBIC AND ANAEROBIC Blood Culture adequate volume   Culture   Final    NO GROWTH 5 DAYS Performed at [facility], [address], [place], [place] [address]    Report Status 09/20/2019 FINAL  Final  SARS Coronavirus 2 by RT PCR (hospital order, performed in [facility] hospital lab) Nasopharyngeal Nasopharyngeal Swab     Status: None   Collection Time: 09/15/19  3:55 PM   Specimen: Nasopharyngeal Swab  Result Value Ref Range Status   SARS Coronavirus 2 NEGATIVE NEGATIVE Final    Comment: (NOTE) SARS-CoV-2 target nucleic acids are NOT DETECTED.  The SARS-CoV-2 RNA is generally detectable in upper and lower respiratory specimens during the acute phase of infection. The lowest concentration of SARS-CoV-2 viral copies this assay can detect is 250 copies / mL. A negative result does not preclude SARS-CoV-2 infection and should not be used as the sole basis for treatment or other patient management decisions.  A negative result may occur with improper specimen collection / handling, submission of specimen other than nasopharyngeal swab, presence of viral mutation(s) within the areas targeted by this assay, and inadequate number of viral copies (<250 copies / mL). A negative result must be combined with clinical observations, patient history, and epidemiological information.  Fact Sheet for Patients:   StrictlyIdeas.no  Fact  Sheet for Healthcare Providers: BankingDealers.co.za  This test is not yet approved or  cleared by the [place] FDA and has been authorized for detection and/or diagnosis of SARS-CoV-2 by FDA under an Emergency Use Authorization (EUA).  This EUA will remain in effect (meaning this test can be used) for the duration of the COVID-19 declaration under Section 564(b)(1) of the Act, 21 U.S.C. section 360bbb-3(b)(1), unless the authorization is terminated or revoked sooner.  Performed at [facility], [address] [place], [place] [address]   Urine Culture     Status: None   Collection Time: 09/15/19  6:11 PM   Specimen: Urine, Random  Result Value Ref Range Status   Specimen Description   Final    URINE, RANDOM Performed at [facility], [address] [place], [place] [address]    Special Requests   Final    NONE Performed at [facility], [address] [place], [place] [address]    Culture   Final    NO GROWTH Performed at [facility], [place] [address] [place], [place] [address]  Report Status 09/17/2019 FINAL  Final  MRSA PCR Screening     Status: None   Collection Time: 09/15/19  6:51 PM   Specimen: Nasal Mucosa; Nasopharyngeal  Result Value Ref Range Status   MRSA by PCR NEGATIVE NEGATIVE Final    Comment:        The GeneXpert MRSA Assay (FDA approved for NASAL specimens only), is one component of a comprehensive MRSA colonization surveillance program. It is not intended to diagnose MRSA infection nor to guide or monitor treatment for MRSA infections. Performed at [facility], [address] [place], [place] [address]   CULTURE, BLOOD (ROUTINE X 2) w Reflex to ID Panel     Status: None   Collection Time: 09/18/19  9:13 AM   Specimen: BLOOD  Result Value Ref Range Status   Specimen Description BLOOD BLOOD RIGHT HAND  Final   Special Requests   Final    BOTTLES DRAWN AEROBIC AND  ANAEROBIC Blood Culture adequate volume   Culture   Final    NO GROWTH 5 DAYS Performed at [facility], [address] [place], [place] [address]    Report Status 09/23/2019 FINAL  Final  CULTURE, BLOOD (ROUTINE X 2) w Reflex to ID Panel     Status: None   Collection Time: 09/18/19  9:45 AM   Specimen: BLOOD  Result Value Ref Range Status   Specimen Description BLOOD BLOOD RIGHT FOREARM  Final   Special Requests   Final    BOTTLES DRAWN AEROBIC AND ANAEROBIC Blood Culture adequate volume   Culture   Final    NO GROWTH 5 DAYS Performed at [facility], [address] [place], [place] [address]    Report Status 09/23/2019 FINAL  Final         Radiology Studies: [facility] Chest Port 1 View  Result Date: 09/24/2019 CLINICAL DATA:  Acute respiratory failure EXAM: PORTABLE CHEST 1 VIEW COMPARISON:  September 17, 2019 FINDINGS: No pneumothorax. The heart size is borderline. The hila and mediastinum are unchanged. New layering effusion with underlying atelectasis on the left. New dense opacity in the right base. No other acute abnormalities. IMPRESSION: 1. New layering effusion on the left. 2. New dense opacity in the right base may represent effusion with underlying atelectasis or pneumonia. Recommend short-term follow-up imaging to ensure resolution. Electronically Signed   By: Dorise Bullion III M.D   On: 09/24/2019 10:11        Scheduled Meds: . acidophilus  1 capsule Oral Daily  . apixaban  10 mg Oral BID   Followed by  . [START ON 09/29/2019] apixaban  5 mg Oral BID  . aspirin EC  81 mg Oral Daily  . Chlorhexidine Gluconate Cloth  6 each Topical Daily  . dicyclomine  10 mg Oral Once  . feeding supplement (ENSURE ENLIVE)  237 mL Oral Q24H  . insulin aspart  0-9 Units Subcutaneous Q4H  . ipratropium-albuterol  3 mL Nebulization QID  . lidocaine  1 patch Transdermal Q24H  . metoprolol tartrate  25 mg Oral BID  . pantoprazole  40 mg Oral Daily   Continuous  Infusions: . sodium chloride Stopped (09/18/19 0815)     Time spent: 25 minutes with over 50% of the time coordinating the patient's care    Harold Hedge, DO [facility] Pager [phone]  Call night coverage person covering after 7pm

## 2019-09-26 LAB — GLUCOSE, CAPILLARY
Glucose-Capillary: 114 mg/dL — ABNORMAL HIGH (ref 70–99)
Glucose-Capillary: 84 mg/dL (ref 70–99)
Glucose-Capillary: 85 mg/dL (ref 70–99)
Glucose-Capillary: 113 mg/dL — ABNORMAL HIGH (ref 70–99)

## 2019-09-26 MED ORDER — IPRATROPIUM-ALBUTEROL 0.5-2.5 (3) MG/3ML IN SOLN
3.0000 mL | Freq: Three times a day (TID) | RESPIRATORY_TRACT | Status: DC
  Administered 2019-09-26 – 2019-09-27 (×2): 3 mL via RESPIRATORY_TRACT
  Filled 2019-09-26 (×4): qty 3

## 2019-09-26 MED ORDER — AMLODIPINE BESYLATE 5 MG PO TABS
5.0000 mg | ORAL_TABLET | Freq: Every day | ORAL | Status: DC
  Administered 2019-09-26 – 2019-09-27 (×2): 5 mg via ORAL
  Filled 2019-09-26 (×2): qty 1

## 2019-09-26 NOTE — Progress Notes (Signed)
PROGRESS NOTE    Pamela James    Code Status: Full Code  SWH:[id number] DOB: 1946/02/15 DOA: 09/15/2019 LOS: 11 days  PCP: Cindra Eves, MD CC:  Chief Complaint  Patient presents with  . Altered Mental Status       Hospital Summary   74 year old female with past medical history of COPD asthma, GERD, MGUS, left hydronephrosis s/p left ureteroscopy on 09/12/2019 with stent and tumor biopsy, hyperlipidemia, anxiety depression, bipolar disorder, thyroid disease, bleeding disorder, history of gross hematuria, malignant tumor of the left ureter for which resection is apparently scheduled at [facility].  Was admitted on 7/2 with acute on chronic hypoxic respiratory failure and altered mental status in the setting of COPD exacerbation.  She required intubation on admission.  She apparently had been receiving pain medications for pain related to her urologic procedure and had brief loss of pulse and received chest compressions.  Her ICU course was slightly prolonged due to an apparent withdrawal syndrome, possibly due to alcohol but unknown, requiring Precedex drip.  She had an apparent aspiration event as well and is on Unasyn.  She has since been extubated and is off all drip medications and stable for transfer to [facility] service 09/21/2019.   Noted to have dark/cloudy urine on 7/8 and renal US was ordered which showed simple left renal cyst and mild bladder wall thickening posteriorly with a questionable small perinephric fluid collection adjacent to mid right kidney.  CT was ordered for further evaluation which showed left ureteral stent grossly good position without any significant hydronephrosis or obstruction and small nonobstructive renal calculi on the right without any other significant findings.   7/8: transfer out of ICU  7/10: Hypertensive emergency unrelieved by PO and IV antihypertensives. With chest pain but EKG unremarkable. Transferred back to SDU for Cardene drip. Given Nitro x 2 with improved  CP. CXR showing new left effusion, concern for flash pulmonary edema as well as new righ base dense opacity (possibly effusion vs. Atelectasis vs. Pneumonia) given lasix.    Has had significant improvement in respiratory status and is now on room air after 2 days of IV diuresis.  Currently awaiting home health approval, urology and psychology consults.  A & P   Active Problems:   Acute on chronic respiratory failure with hypoxia (HCC)   1. Hypertensive Emergency  Hypertension resolved a. Continue Lopressor twice daily b. Restarted amlodipine at decreased dose remaining home meds on hold for now, reevaluate negative  2. Acute hypoxic respiratory failure secondary to possible aspiration pneumonia and suspected atelectasis exacerbated by post CPR anxiety  Flash pulmonary edema  a. Completed 7 days Unasyn b. As needed bronchodilators c. Incentive spirometer  d. Encouraged smoking cessation e. Improved with Lasix dose, now euvolemic, hold further diuresis f. OOB g. PCCM on board  3. Bradycardia, resolved a. S/p transient loss of pulse with ROSC, currently off dopamine drip b. Restarted beta blocker and tolerating  4. Change in urine with history of left hydronephrosis s/p left cystoureteroscopy, stent and tumor biopsy at [facility]  History of Left ureteral tumor, improved a. Noted to have dark/cloudy urine on 7/8 and renal US was ordered which showed simple left renal cyst and mild bladder wall thickening posteriorly with a questionable small perinephric fluid collection adjacent to mid right kidney.  CT was ordered for further evaluation which showed left ureteral stent grossly good position without any significant hydronephrosis or obstruction and small nonobstructive renal calculi on the right without any other significant findings b.  urology consulted  5. AKI, resolved  6. Chest pain secondary to CPR and Hypertensive Emergency, improved a. Improved following cardene drip and nitro, now  with prior musculoskeletal chest pain from cpr b. Tylenol PRN  c. lidoderm patch   7. Left subclavian DVT a. Continue Eliquis for at least 3 months  8. History of anxiety, Bipolar disorder, depression a. Family requesting psych consult as they are concerned this is leading to her current and presenting issues b. Psych consulted  9. Acute metabolic encephalopathy secondary to metabolic derangement, resolved   DVT prophylaxis: SCDs Start: 09/15/19 1703   apixaban (ELIQUIS) tablet 10 mg  apixaban (ELIQUIS) tablet 5 mg   Family Communication: Patient's son has been updated today  Disposition Plan: pending neurology consult, improve respiratory status and home health approval Status is: Inpatient  Remains inpatient appropriate because:Unsafe d/c plan and Inpatient level of care appropriate due to severity of illness   Dispo: The patient is from: Home              Anticipated d/c is to: Home              Anticipated d/c date is: 2 days              Patient currently is not medically stable to d/c.          Pressure injury documentation    None  Consultants  Urology Cardiology PCCM  Procedures   Intubated-> extubated  Antibiotics   Anti-infectives (From admission, onward)   Start     Dose/Rate Route Frequency Ordered Stop   09/19/19 1000  vancomycin (VANCOCIN) IVPB 1000 mg/200 mL premix  Status:  Discontinued        1,000 mg 200 mL/hr over 60 Minutes Intravenous Every 24 hours 09/18/19 1228 09/19/19 0904   09/19/19 0700  vancomycin (VANCOREADY) IVPB 1250 mg/250 mL  Status:  Discontinued        1,250 mg 166.7 mL/hr over 90 Minutes Intravenous Every 24 hours 09/18/19 0701 09/18/19 1228   09/18/19 0700  vancomycin (VANCOREADY) IVPB 1500 mg/300 mL        1,500 mg 150 mL/hr over 120 Minutes Intravenous  Once 09/18/19 0652 09/18/19 1049   09/17/19 1155  Ampicillin-Sulbactam (UNASYN) 3 g in sodium chloride 0.9 % 100 mL IVPB  Status:  Discontinued        3 g 200  mL/hr over 30 Minutes Intravenous Every 6 hours 09/17/19 1056 09/22/19 1156   09/15/19 1800  Ampicillin-Sulbactam (UNASYN) 3 g in sodium chloride 0.9 % 100 mL IVPB  Status:  Discontinued        3 g 200 mL/hr over 30 Minutes Intravenous Every 12 hours 09/15/19 1758 09/17/19 1056   09/15/19 1545  cefTRIAXone (ROCEPHIN) 1 g in sodium chloride 0.9 % 100 mL IVPB        1 g 200 mL/hr over 30 Minutes Intravenous  Once 09/15/19 1532 09/15/19 1617   09/15/19 1545  azithromycin (ZITHROMAX) 500 mg in sodium chloride 0.9 % 250 mL IVPB        500 mg 250 mL/hr over 60 Minutes Intravenous  Once 09/15/19 1532 09/15/19 1742        Subjective   Doing well today and in good spirits making jokes.  Very happy with her care today.  No complaints and no overnight events.  Objective   Vitals:   09/25/19 2001 09/26/19 0505 09/26/19 0744 09/26/19 0753  BP:  (!) 162/48 (!) 155/43   Pulse:  63 73   Resp:   18   Temp:  99.3 F (37.4 C) 99 F (37.2 C)   TempSrc:  Oral Oral   SpO2: 92% 92% 92% 91%  Weight:  61.6 kg    Height:        Intake/Output Summary (Last 24 hours) at 09/26/2019 1647 Last data filed at 09/26/2019 1500 Gross per 24 hour  Intake --  Output 1701 ml  Net -1701 ml   Filed Weights   09/23/19 0500 09/25/19 0209 09/26/19 0505  Weight: 62 kg 62.9 kg 61.6 kg    Examination:  Physical Exam Vitals and nursing note reviewed.  Constitutional:      Appearance: Normal appearance.  HENT:     Head: Normocephalic and atraumatic.  Eyes:     Conjunctiva/sclera: Conjunctivae normal.  Cardiovascular:     Rate and Rhythm: Normal rate and regular rhythm.  Pulmonary:     Effort: Pulmonary effort is normal.     Breath sounds: Normal breath sounds.  Abdominal:     General: Abdomen is flat.     Palpations: Abdomen is soft.  Musculoskeletal:        General: No swelling or tenderness.  Skin:    Coloration: Skin is not jaundiced or pale.  Neurological:     Mental Status: She is alert.  Mental status is at baseline.  Psychiatric:        Mood and Affect: Mood normal.        Behavior: Behavior normal.     Data Reviewed: I have personally reviewed following labs and imaging studies  CBC: Recent Labs  Lab 09/20/19 0826 09/21/19 0440 09/22/19 0521 09/23/19 0710 09/25/19 0730  WBC 14.0* 12.1* 12.7* 11.9* 10.3  HGB 9.3* 9.4* 9.3* 9.1* 9.1*  HCT 26.7* 27.0* 27.7* 26.7* 26.7*  MCV 92.1 89.7 94.2 92.7 92.1  PLT 438* 456* 468* 497* 176*   Basic Metabolic Panel: Recent Labs  Lab 09/20/19 0826 09/20/19 0826 09/21/19 0440 09/22/19 0521 09/23/19 0710 09/24/19 0433 09/25/19 0730  NA 141   < > 143 142  142 142 142 139  K 3.1*   < > 4.2 3.7  3.6 3.5 3.9 4.0  CL 97*   < > 104 108  107 107 111 108  CO2 28   < > 28 26  25 26 24 25   GLUCOSE 92   < > 106* 92  94 100* 96 94  BUN 25*   < > 25* 20  21 16 17  26*  CREATININE 1.06*   < > 1.00 0.90  0.86 0.72 0.87 0.81  CALCIUM 9.2   < > 9.4 9.6  9.5 9.7 9.5 9.8  MG 2.1  --  2.1 2.1  --   --   --   PHOS 4.2  --  2.9 3.0  --   --   --    < > = values in this interval not displayed.   GFR: Estimated Creatinine Clearance: 49.6 mL/min (by C-G formula based on SCr of 0.81 mg/dL). Liver Function Tests: Recent Labs  Lab 09/20/19 0826 09/21/19 0440 09/22/19 0521  ALBUMIN 3.2* 3.1* 3.1*   No results for input(s): LIPASE, AMYLASE in the last 168 hours. No results for input(s): AMMONIA in the last 168 hours. Coagulation Profile: No results for input(s): INR, PROTIME in the last 168 hours. Cardiac Enzymes: No results for input(s): CKTOTAL, CKMB, CKMBINDEX, TROPONINI in the last 168 hours. BNP (last 3 results) No results for input(s): PROBNP in  the last 8760 hours. HbA1C: No results for input(s): HGBA1C in the last 72 hours. CBG: Recent Labs  Lab 09/25/19 1147 09/25/19 1549 09/25/19 2114 09/26/19 1004 09/26/19 1210  GLUCAP 93 103* 111* 114* 84   Lipid Profile: No results for input(s): CHOL, HDL, LDLCALC,  TRIG, CHOLHDL, LDLDIRECT in the last 72 hours. Thyroid Function Tests: No results for input(s): TSH, T4TOTAL, FREET4, T3FREE, THYROIDAB in the last 72 hours. Anemia Panel: No results for input(s): VITAMINB12, FOLATE, FERRITIN, TIBC, IRON, RETICCTPCT in the last 72 hours. Sepsis Labs: No results for input(s): PROCALCITON, LATICACIDVEN in the last 168 hours.  Recent Results (from the past 240 hour(s))  CULTURE, BLOOD (ROUTINE X 2) w Reflex to ID Panel     Status: None   Collection Time: 09/18/19  9:13 AM   Specimen: BLOOD  Result Value Ref Range Status   Specimen Description BLOOD BLOOD RIGHT HAND  Final   Special Requests   Final    BOTTLES DRAWN AEROBIC AND ANAEROBIC Blood Culture adequate volume   Culture   Final    NO GROWTH 5 DAYS Performed at [facility], [address] [place], [place] [address]    Report Status 09/23/2019 FINAL  Final  CULTURE, BLOOD (ROUTINE X 2) w Reflex to ID Panel     Status: None   Collection Time: 09/18/19  9:45 AM   Specimen: BLOOD  Result Value Ref Range Status   Specimen Description BLOOD BLOOD RIGHT FOREARM  Final   Special Requests   Final    BOTTLES DRAWN AEROBIC AND ANAEROBIC Blood Culture adequate volume   Culture   Final    NO GROWTH 5 DAYS Performed at [facility], [address] [place], [place] [address]    Report Status 09/23/2019 FINAL  Final         Radiology Studies: No results found.      Scheduled Meds: . acidophilus  1 capsule Oral Daily  . amLODipine  5 mg Oral Daily  . apixaban  10 mg Oral BID   Followed by  . [START ON 09/29/2019] apixaban  5 mg Oral BID  . aspirin EC  81 mg Oral Daily  . Chlorhexidine Gluconate Cloth  6 each Topical Daily  . dicyclomine  10 mg Oral Once  . feeding supplement (ENSURE ENLIVE)  237 mL Oral Q24H  . insulin aspart  0-5 Units Subcutaneous QHS  . insulin aspart  0-9 Units Subcutaneous TID WC  . ipratropium-albuterol  3 mL Nebulization TID  . lidocaine  1  patch Transdermal Q24H  . metoprolol tartrate  25 mg Oral BID  . pantoprazole  40 mg Oral Daily   Continuous Infusions: . sodium chloride Stopped (09/18/19 0815)     Time spent: 26 minutes with over 50% of the time coordinating the patient's care    Harold Hedge, DO [facility] Pager [phone]  Call night coverage person covering after 7pm

## 2019-09-26 NOTE — Progress Notes (Signed)
Spoke with son Venora Maples on telephone. RN answered some questions and addressed some concerns. Son request MD to update him on patient condition and discharge plan. RN secure chatted MD with phone number. I will continue to assess.

## 2019-09-27 LAB — CBC
HCT: 26.6 % — ABNORMAL LOW (ref 36.0–46.0)
Hemoglobin: 9 g/dL — ABNORMAL LOW (ref 12.0–15.0)
MCH: 31.6 pg (ref 26.0–34.0)
MCHC: 33.8 g/dL (ref 30.0–36.0)
MCV: 93.3 fL (ref 80.0–100.0)
Platelets: 700 10*3/uL — ABNORMAL HIGH (ref 150–400)
RBC: 2.85 MIL/uL — ABNORMAL LOW (ref 3.87–5.11)
RDW: 15.8 % — ABNORMAL HIGH (ref 11.5–15.5)
WBC: 9.3 10*3/uL (ref 4.0–10.5)
nRBC: 0 % (ref 0.0–0.2)

## 2019-09-27 LAB — BASIC METABOLIC PANEL
Anion gap: 7 (ref 5–15)
BUN: 25 mg/dL — ABNORMAL HIGH (ref 8–23)
CO2: 26 mmol/L (ref 22–32)
Calcium: 9.7 mg/dL (ref 8.9–10.3)
Chloride: 108 mmol/L (ref 98–111)
Creatinine, Ser: 0.83 mg/dL (ref 0.44–1.00)
GFR calc Af Amer: >60 mL/min (ref >60)
GFR calc non Af Amer: >60 mL/min (ref >60)
Glucose, Bld: 103 mg/dL — ABNORMAL HIGH (ref 70–99)
Potassium: 3.9 mmol/L (ref 3.5–5.1)
Sodium: 141 mmol/L (ref 135–145)

## 2019-09-27 LAB — GLUCOSE, CAPILLARY
Glucose-Capillary: 94 mg/dL (ref 70–99)
Glucose-Capillary: 93 mg/dL (ref 70–99)

## 2019-09-27 MED ORDER — ENSURE ENLIVE PO LIQD: 237.0000 mL | ORAL | 1 refills | Status: DC

## 2019-09-27 MED ORDER — APIXABAN 5 MG PO TABS: 5.0000 mg | ORAL_TABLET | Freq: Two times a day (BID) | ORAL | 1 refills | Status: DC

## 2019-09-27 MED ORDER — LIDOCAINE 5 % EX PTCH: 1.0000 | MEDICATED_PATCH | CUTANEOUS | 0 refills | Status: DC

## 2019-09-27 MED ORDER — NITROGLYCERIN 0.4 MG SL SUBL: 0.4000 mg | SUBLINGUAL_TABLET | SUBLINGUAL | 0 refills | Status: DC | PRN

## 2019-09-27 MED ORDER — METOPROLOL TARTRATE 25 MG PO TABS: 25.0000 mg | ORAL_TABLET | Freq: Two times a day (BID) | ORAL | 1 refills | Status: DC

## 2019-09-27 NOTE — Progress Notes (Signed)
PT Cancellation Note  Patient Details Name: Pamela James MRN: [id number] DOB: Nov 08, 1945   Cancelled Treatment:    Reason Eval/Treat Not Completed: Other (comment). Order received and chart reviewed. Screen via OT who reports patient is at baseline level. No needs identified, RN aware. Will sign off at this time. Please re-consult if necessary.   Nikolaos Maddocks 09/27/2019, 1:46 PM  Greggory Stallion, PT, DPT [phone]

## 2019-09-27 NOTE — Progress Notes (Signed)
Nutrition Follow Up Note   DOCUMENTATION CODES:   Not applicable  INTERVENTION:   Ensure Enlive po daily, each supplement provides 350 kcal and 20 grams of protein  Magic cup BID with meals, each supplement provides 290 kcal and 9 grams of protein  NUTRITION DIAGNOSIS:   Increased nutrient needs related to catabolic illness (COPD) as evidenced by increased estimated needs.  GOAL:   Patient will meet greater than or equal to 90% of their needs -progressing   MONITOR:   PO intake, Supplement acceptance, Labs, Weight trends, Skin, I & O's  ASSESSMENT:   74 year old female with past medical history of COPD asthma, GERD, MGUS, left hydronephrosis s/p left ureteroscopy on 09/12/2019 with stent and tumor biopsy, hyperlipidemia, anxiety depression, bipolar disorder, thyroid disease, bleeding disorder, history of gross hematuria, malignant tumor of the left ureter for which resection is apparently scheduled at [facility].  Was admitted on 7/2 with acute on chronic hypoxic respiratory failure and altered mental status in the setting of COPD exacerbation.   Pt with fair appetite and oral intake in hospital; pt eating anywhere from sips and bites to 100% of meals. Pt ate 100% of her breakfast this morning which included eggs, sausage and oatmeal. Pt is drinking some Ensure but is also refusing some of them. Pt getting Magic Cups on meal trays. Per chart, pt down ~20lbs since admit r/t fluid changes. Pt is scheduled for discharge today.   Medications reviewed and include: risaquad, aspirin, insulin, protonix  Labs reviewed: BUN 25(H) Hgb 9.0(L), Hct 26.6(L)  Diet Order:   Diet Order            Diet - low sodium heart healthy           DIET DYS 2 Room service appropriate? Yes with Assist; Fluid consistency: Thin  Diet effective now                EDUCATION NEEDS:   No education needs have been identified at this time  Skin:  Skin Assessment: Reviewed RN Assessment  Last BM:   7/7  Height:   Ht Readings from Last 1 Encounters:  09/25/19 5\' 3"  (1.6 m)    Weight:   Wt Readings from Last 1 Encounters:  09/27/19 61.1 kg    Ideal Body Weight:  59.1 kg  BMI:  Body mass index is 23.88 kg/m.  Estimated Nutritional Needs:   Kcal:  1500-1700kcal/day  Protein:  75-85g/day  Fluid:  1.3-1.6L/day  Koleen Distance MS, RD, LDN Please refer to [facility] for RD and/or RD on-call/weekend/after hours pager

## 2019-09-27 NOTE — Evaluation (Signed)
Occupational Therapy Evaluation Patient Details Name: Pamela James MRN: [id number] DOB: April 06, 1945 Today's Date: 09/27/2019    History of Present Illness Pt is a 74 y.o. female that presented to ED for SOB, admitted on 7/2 with acute on chronic hypoxic respiratory failure and altered mental status in the setting of COPD exacerbation.  She required intubation on admission, subsequently she had a brief episode of arrest where she was noted to be bradycardic and hypotensive. Extubated 7/4. Workup positive LUE DVT. PMH significant for COPD, Asthma,  GERD, MGUS, Hydronephrosis of left Kidney, HLD, Anxiety and Depression, Thrombocytosis, Bipolar disorder, Thyroid disease, tobacco abuse, bleeding disorder,hx of gross hematuria and left hydronephrosis, s/p left ureteroscopy on 09/12/2019 with stent and tumor biopsy.   Clinical Impression   Pamela James was seen for OT evaluation this date. Prior to hospital admission, pt was generally independent in all aspects of ADL/IADL management, using a SPC for community distances, and denies falls history in past 12 months. Pt lives with her son, DIL, and 2 grand children in a 1 story home with 3 steps to enter. Pt demonstrates baseline independence to perform ADL and mobility tasks and no strength, sensory, coordination, cognitive, or visual deficits appreciated with assessment. She reports feeling much better since admission and denies difficulty with toileting, dressing, or bathing this date. She is noted to be up ad lib in room w/o a bed alarm set upon OT arrival. No skilled OT needs identified. Will sign off. Please re-consult if additional OT needs arise during this admission. Do not anticipate need for follow-up OT services upon hospital DC.      Follow Up Recommendations  No OT follow up    Equipment Recommendations  None recommended by OT    Recommendations for Other Services       Precautions / Restrictions Precautions Precautions:  Fall Restrictions Weight Bearing Restrictions: No      Mobility Bed Mobility Overal bed mobility: Modified Independent             General bed mobility comments: Comes to sitting EOB with good safety awareness/control. HOB mildly elevated.  Transfers Overall transfer level: Independent Equipment used: None             General transfer comment: Pt ambulates in room w/o device. Able to turn and navigate obstacles with good safety awareness. Steady standing, talking with CSW from room door for ~1 min w/o fatigue or LOB.    Balance Overall balance assessment: No apparent balance deficits (not formally assessed);Independent                                         ADL either performed or assessed with clinical judgement   ADL Overall ADL's : At baseline                                       General ADL Comments: Pt up ad lib in room to use BSC. States she feels much better, back to baseline level of functional independence. No strength, sensory, cognitive or balance deficits appreciated during OT evaluation. Pt demos good safety awareness. Ambulates in room w/o a device, which is baseline.     Vision Baseline Vision/History: Cataracts;No visual deficits (hx bilat cataract sx.) Patient Visual Report: No change from baseline  Perception     Praxis      Pertinent Vitals/Pain Pain Assessment: No/denies pain     Hand Dominance Right   Extremity/Trunk Assessment Upper Extremity Assessment Upper Extremity Assessment: Overall WFL for tasks assessed   Lower Extremity Assessment Lower Extremity Assessment: Overall WFL for tasks assessed   Cervical / Trunk Assessment Cervical / Trunk Assessment: Normal   Communication Communication Communication: No difficulties   Cognition Arousal/Alertness: Awake/alert Behavior During Therapy: WFL for tasks assessed/performed Overall Cognitive Status: Within Functional Limits for tasks  assessed                                     General Comments  Pt vitals remain WFLs. Pt denies SOB or fatigue with activity. On RA t/o evaluation.    Exercises Other Exercises Other Exercises: Pt educated on falls prevention strategies for home and hospital.   Shoulder Instructions      Home Living Family/patient expects to be discharged to:: Private residence Living Arrangements: Children Available Help at Discharge: Family Type of Home: House Home Access: Stairs to enter Technical brewer of Steps: 3 Entrance Stairs-Rails: [place]: One level     Bathroom Shower/Tub: Tub/shower unit;Curtain   Bathroom Toilet: Handicapped height     [place]: Environmental consultant - 2 wheels;Cane - single point;Shower seat;Grab bars - tub/shower;Grab bars - toilet          Prior Functioning/Environment Level of Independence: Independent with assistive device(s)        Comments: Pt reports she is independent for ADL management. Occasionally uses a SPC for functional mobility. Denies falls hx in past year. No home O2.        OT Problem List: Cardiopulmonary status limiting activity;Decreased strength;Decreased activity tolerance;Decreased knowledge of use of DME or AE      OT Treatment/Interventions:      OT Goals(Current goals can be found in the care plan section) Acute Rehab OT Goals Patient Stated Goal: To go home OT Goal Formulation: All assessment and education complete, DC therapy Time For Goal Achievement: 09/27/19 Potential to Achieve Goals: Good  OT Frequency:     Barriers to D/C:            Co-evaluation              AM-PAC OT "6 Clicks" Daily Activity     Outcome Measure Help from another person eating meals?: None Help from another person taking care of personal grooming?: None Help from another person toileting, which includes using toliet, bedpan, or urinal?: None Help from another person bathing (including washing,  rinsing, drying)?: None Help from another person to put on and taking off regular upper body clothing?: None Help from another person to put on and taking off regular lower body clothing?: None 6 Click Score: 24   End of Session Equipment Utilized During Treatment: Gait belt Nurse Communication: Mobility status;Other (comment) (OT signing off; no skilled needs.)  Activity Tolerance: Patient tolerated treatment well Patient left: in bed;with call bell/phone within reach (Pt recieved with bed alarm off, has been up ad lib in room.)  OT Visit Diagnosis: Other abnormalities of gait and mobility (R26.89);Muscle weakness (generalized) (M62.81)                Time: 6301-6010 OT Time Calculation (min): 15 min Charges:  OT General Charges $OT Visit: 1 Visit OT Evaluation $OT Eval Low Complexity: 1 Low  Shara Blazing, M.S., OTR/L Ascom: [phone] 09/27/19, 2:34 PM

## 2019-09-27 NOTE — Discharge Summary (Addendum)
Physician Discharge Summary  Pamela James YOV:[id number] DOB: 1946-02-24 DOA: 09/15/2019  PCP: Cindra Eves, MD  Admit date: 09/15/2019 Discharge date: 09/27/2019  Admitted From: home Disposition:  home  Recommendations for Outpatient Follow-up:  Follow up with PCP in 1-2 weeks Please obtain BMP/CBC in one week Please follow up with urology at [facility]: Yes, PT  Equipment/Devices: None   Discharge Condition: Stable  CODE STATUS: Full  Diet recommendation:  Heart Healthy  Chopped foods and soft consistency foods for ease of gumming/mashing and for esophageal clearing.  Pills in Puree as needed for ease of swallowing.   Discharge Diagnoses: Active Problems:   Acute on chronic respiratory failure with hypoxia (HCC)  Sepsis due to aspiration pneumoniae  Acute metabolic encephalopathy - due to infection, respiratory failure and medications - resolved  Bradycardia   Hisotry of left hydronephrosis s/p left cystoscopy, stent, tumor biopsy at [facility] History of left ureteral tumor Acute kidney injury - present on admission -resolved Left subclavian DVT   Summary of HPI and Hospital Course:   From Dr. 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  Was admitted on 7/2 with acute on chronic hypoxic respiratory failure and altered mental status in the setting of COPD exacerbation.  She required intubation on admission.  She apparently had been receiving pain medications for pain related to her urologic procedure and had brief loss of pulse and received chest compressions.  Her ICU course was slightly prolonged due to an apparent withdrawal syndrome,  possibly due to alcohol but unknown, requiring Precedex drip.  She had an apparent aspiration event as well and is on Unasyn.  She has since been extubated and is off all drip medications and stable for transfer to [facility] service 09/21/2019.   Noted to have dark/cloudy urine on 7/8 and renal US was ordered which showed simple left renal cyst and mild bladder wall thickening posteriorly with a questionable small perinephric fluid collection adjacent to mid right kidney.  CT was ordered for further evaluation which showed left ureteral stent grossly good position without any significant hydronephrosis or obstruction and small nonobstructive renal calculi on the right without any other significant findings.    7/8: transfer out of ICU   7/10: Hypertensive emergency unrelieved by PO and IV antihypertensives. With chest pain but EKG unremarkable. Transferred back to SDU for Cardene drip. Given Nitro x 2 with improved CP. CXR showing new left effusion, concern for flash pulmonary edema as well as new righ base dense opacity (possibly effusion vs. Atelectasis vs. Pneumonia) given lasix.    Has had significant improvement in respiratory status and is now on room air after 2 days of IV diuresis."  7/12: Evaluated by urology.  "Hematuria most likely secondary to her indwelling stent and potential bleeding from her ureteral tumor."  Close follow up with her urologist at [facility] was recommended.  TOC was unable to secure a home health agency for the patient, due to her insurance carrier.  Her information was forwarded to [facility] management to continue to work on it.   Discharge Instructions   Discharge Instructions     Call MD for:  extreme fatigue   Complete by: As directed    Call MD for:  persistant dizziness or light-headedness   Complete by: As directed    Call MD for:  severe uncontrolled pain   Complete by: As directed    Call MD for:  temperature >100.4   Complete by: As directed    Diet - low sodium  heart healthy   Complete by: As directed    Discharge instructions   Complete by: As directed    You can use Lidocaine patches over your chest to help with the pain from having CPR. Alternatively, lidocaine cream is sold over the counter and can be used if the patches are too expensive.  Take your medications as prescribed.  Please monitor your blood pressure at home.  Take it once or twice daily and write down your Bp's.  Bring this to your doctor appointments for follow up.  Please see your urologist at [facility] for follow up.   Increase activity slowly   Complete by: As directed       Allergies as of 09/27/2019       Reactions   Aspirin Other (See Comments)   GI ulcers   Morphine And Related Other (See Comments)   Delerium, itching   Nsaids Other (See Comments)   GI ulcers        Medication List     STOP taking these medications    carvedilol 12.5 MG tablet Commonly known as: COREG   lisinopril 40 MG tablet Commonly known as: ZESTRIL       TAKE these medications    albuterol 108 (90 Base) MCG/ACT inhaler Commonly known as: VENTOLIN HFA Inhale 2 puffs into the lungs every 6 (six) hours as needed for wheezing or shortness of breath.   albuterol (2.5 MG/3ML) 0.083% nebulizer solution Commonly known as: PROVENTIL Take 2.5 mg by nebulization every 6 (six) hours as needed for wheezing or shortness of breath.   alendronate 70 MG tablet Commonly known as: FOSAMAX Take 70 mg by mouth once a week. Take with a full glass of water on an empty stomach.   amLODipine 10 MG tablet Commonly known as: NORVASC Take 10 mg by mouth daily.   apixaban 5 MG Tabs tablet Commonly known as: ELIQUIS Take 1 tablet (5 mg total) by mouth 2 (two) times daily. Start taking on: September 29, 2019   aspirin 81 MG EC tablet Take 1 tablet (81 mg total) by mouth daily.   diphenhydrAMINE 25 mg capsule Commonly known as: BENADRYL Take 25 mg by mouth every 6 (six) hours as needed for itching or  allergies.   feeding supplement (ENSURE ENLIVE) Liqd Take 237 mLs by mouth daily.   fluticasone-salmeterol 230-21 MCG/ACT inhaler Commonly known as: ADVAIR HFA Inhale 2 puffs into the lungs 2 (two) times daily.   gabapentin 300 MG capsule Commonly known as: NEURONTIN Take 300 mg by mouth 2 (two) times daily.   hydrALAZINE 25 MG tablet Commonly known as: APRESOLINE Take 25 mg by mouth in the morning and at bedtime.   Ipratropium-Albuterol 20-100 MCG/ACT Aers respimat Commonly known as: COMBIVENT Inhale 1 puff into the lungs 4 (four) times daily.   lidocaine 5 % Commonly known as: LIDODERM Place 1 patch onto the skin daily. Remove & Discard patch within 12 hours or as directed by MD Start taking on: September 28, 2019   metoprolol tartrate 25 MG tablet Commonly known as: LOPRESSOR Take 1 tablet (25 mg total) by mouth 2 (two) times daily.   nitroGLYCERIN 0.4 MG SL tablet Commonly known as: NITROSTAT Place 1  tablet (0.4 mg total) under the tongue every 5 (five) minutes as needed for chest pain.   omeprazole 20 MG capsule Commonly known as: PRILOSEC Take 20 mg by mouth daily.   ondansetron 4 MG disintegrating tablet Commonly known as: ZOFRAN-ODT Take 4 mg by mouth every 8 (eight) hours as needed for nausea or vomiting.   pravastatin 20 MG tablet Commonly known as: PRAVACHOL Take 20 mg by mouth at bedtime.   tamsulosin 0.4 MG Caps capsule Commonly known as: FLOMAX Take 0.4 mg by mouth daily.   vitamin B-12 1000 MCG tablet Commonly known as: CYANOCOBALAMIN Take 1,000 mcg by mouth daily.         Allergies  Allergen Reactions   Aspirin Other (See Comments)    GI ulcers   Morphine And Related Other (See Comments)    Delerium, itching   Nsaids Other (See Comments)    GI ulcers    Consultations: Cardiology Urology  PCCM   Procedures/Studies: EEG  Result Date: 09/16/2019 Philemon Kingdom, MD     09/16/2019  7:56 PM Date of Study: 09/16/2019   Reason for Study:  Pt is a 61 YOF with PMH of HTN, HLD, COPD/ASTHMA, MGUS, ANXIETY/DEPRESSION, BIPOLAR D/O, hx of gross hematuria and left hydronephrosis, s/p left ureteroscopy 5/27/2,  malignant tumor of the left ureter who had acute hypoxic respiratory failure and AMS. Eval for seizures.   Description of recording: This recording was obtained using a Digital EEG system with 18 channel capacity . Standard bipolar and referential EEG montages were used following the International  10 - 20  System .   This is a digitally recorded EEG with duration of approximately ~21:17 minutes. The background consists of 7-8 hz alpha rhythm which attenuates with eye opening and closure.  There were no epileptiform discharges noted.  There were no recorded electrographic seizures. Hyperventilation was not performed. Photic stimulation was not performed. Sleep did not occur in this recording.    IMPRESSION: This is a normal EEG for age. There were no clinical seizures or epileptiform discharges noted during study. CLINICAL CORRELATION: This EEG finding did not support the diagnosis of seizure.  Clinical correlation is advised. A normal EEG does not rule out epilepsy, which is a clinical diagnosis.   I have personally reviewed this study.  DG Chest 1 View  Result Date: 09/15/2019 CLINICAL DATA:  Deoxygenation. EXAM: CHEST  1 VIEW COMPARISON:  September 15, 2019 FINDINGS: There is stable endotracheal tube and nasogastric tube positioning. Mild left basilar atelectasis and/or infiltrate is seen. This is decreased in severity when compared to the prior study. A very small left pleural effusion is also seen. This is also decreased in size. There is mild elevation of the left hemidiaphragm. No pneumothorax is identified. The heart size and mediastinal contours are within normal limits. There is marked severity calcification of the aortic arch. The visualized skeletal structures are unremarkable. The proximal end of a left-sided endo ureteral stent is seen  within the visualized portion of the abdomen. IMPRESSION: Mild left basilar atelectasis and/or infiltrate, decreased in size when compared to the prior study. Electronically Signed   By: Virgina Norfolk M.D.   On: 09/15/2019 17:58   DG Chest 1 View  Result Date: 09/15/2019 CLINICAL DATA:  Altered mental status. EXAM: CHEST  1 VIEW COMPARISON:  November 21, 2017. FINDINGS: There is interval development of diffuse left lung opacity concerning for atelectasis or pneumonia with some degree of volume loss causing mild right to left mediastinal  shift. No pneumothorax is noted. Possible small left pleural effusion is noted. The right lung is hyperexpanded but otherwise normal. Bony thorax is unremarkable. IMPRESSION: Interval development of diffuse left lung opacity concerning for atelectasis or pneumonia with some degree of volume loss causing mild right to left mediastinal shift. Possible small left pleural effusion. Aortic Atherosclerosis (ICD10-I70.0). Electronically Signed   By: Marijo Conception M.D.   On: 09/15/2019 15:01   CT HEAD WO CONTRAST  Result Date: 09/16/2019 CLINICAL DATA:  Altered mental status, unclear cause EXAM: CT HEAD WITHOUT CONTRAST TECHNIQUE: Contiguous axial images were obtained from the base of the skull through the vertex without intravenous contrast. COMPARISON:  None. FINDINGS: Brain: Hypoattenuating focus in the left external capsule likely reflects a remote lacunar infarct. No evidence of acute infarction, hemorrhage, hydrocephalus, extra-axial collection or mass lesion/mass effect. Symmetric prominence of the ventricles, cisterns and sulci compatible with parenchymal volume loss. Patchy areas of white matter hypoattenuation are most compatible with chronic microvascular angiopathy. Senescent mineralization seen in the basal ganglia. Vascular: Atherosclerotic calcification of the carotid siphons. No hyperdense vessel. Skull: No calvarial fracture or suspicious osseous lesion. No scalp  swelling or hematoma. Sinuses/Orbits: Minimal thickening in the ethmoids. Remaining paranasal sinuses and mastoid air cells are predominantly clear without pneumatized secretions or air-fluid levels. Hypo pneumatization of the frontal sinuses. Included orbital structures are unremarkable. Other: None IMPRESSION: 1. 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 2. No definite acute intracranial findings. 3. Chronic microvascular angiopathy and parenchymal volume loss. Electronically Signed   By: Lovena Le M.D.   On: 09/16/2019 00:11   CT ABDOMEN PELVIS W CONTRAST  Result Date: 09/21/2019 CLINICAL DATA:  Abdominal abscess or infection. EXAM: CT ABDOMEN AND PELVIS WITH CONTRAST TECHNIQUE: Multidetector CT imaging of the abdomen and pelvis was performed using the standard protocol following bolus administration of intravenous contrast. CONTRAST:  129mL OMNIPAQUE IOHEXOL 300 MG/ML  SOLN COMPARISON:  None. FINDINGS: Lower chest: Small bilateral pleural effusions are noted with adjacent atelectasis of both lower lobes. Hepatobiliary: No focal liver abnormality is seen. No gallstones, gallbladder wall thickening, or biliary dilatation. Pancreas: Unremarkable. No pancreatic ductal dilatation or surrounding inflammatory changes. Spleen: Normal in size without focal abnormality. Adrenals/Urinary Tract: Adrenal glands appear normal. Small nonobstructive right renal calculi are noted. Left ureteral stent is noted in grossly good position. No significant hydronephrosis or renal obstruction is noted. Urinary bladder is unremarkable. Stomach/Bowel: The stomach appears normal. There is no evidence of bowel obstruction or inflammation. The appendix is not visualized, but no inflammation is noted in the right lower quadrant. Vascular/Lymphatic: Aortic atherosclerosis. No enlarged abdominal or pelvic lymph nodes. Reproductive: Status post hysterectomy. No adnexal masses. Other: Small fat containing periumbilical  hernia is noted. No ascites is noted. Musculoskeletal: No acute or significant osseous findings. IMPRESSION: 1. Small bilateral pleural effusions are noted with adjacent atelectasis of both lower lobes. 2. Left ureteral stent is noted in grossly good position. No significant hydronephrosis or renal obstruction is noted. 3. Small nonobstructive right renal calculi. 4. Small fat containing periumbilical hernia. Aortic Atherosclerosis (ICD10-I70.0). Electronically Signed   By: Marijo Conception M.D.   On: 09/21/2019 13:15   US RENAL  Result Date: 09/21/2019 CLINICAL DATA:  Recent cystoscopy with biopsy of ureteral tumor and RIGHT stent placement, now with dark urine and containing sediment EXAM: RENAL / URINARY TRACT ULTRASOUND COMPLETE COMPARISON:  Renal ultrasound 09/16/2019 FINDINGS: Right Kidney: Renal measurements: 9.9 x 4.3 x 5.1 cm = volume: 114  mL. Normal cortical thickness. Upper normal cortical echogenicity. Small cortical scar upper pole RIGHT kidney. Question exophytic mass versus perinephric collection RIGHT kidney 8 x 12 x 30 mm at mid kidney. No additional mass or hydronephrosis. Left Kidney: Renal measurements: 10.2 x 5.1 x 4.9 cm = volume: 132 mL. Normal cortical thickness and echogenicity. Small cyst at upper pole 16 x 17 x 11 mm, simple features. No additional mass, hydronephrosis, or shadowing calcification. Bladder: Normally distended. BILATERAL ureteral jets visualized. Questionable wall thickening of the posterior inferior bladder wall versus edema related to recent procedure, recommend correlation with preceding cystoscopy findings. Other: Incidentally noted small LEFT pleural effusion. IMPRESSION: Small pleural effusion. Small simple LEFT renal cyst 17 mm greatest size. Question mild bladder wall thickening inferior posteriorly, recommend correlation with cystoscopy findings. Questionable small perinephric fluid collection versus complicated cystic lesion adjacent to mid RIGHT kidney; further  assessment by CT imaging with contrast recommended. Electronically Signed   By: Lavonia Dana M.D.   On: 09/21/2019 11:24   US RENAL  Result Date: 09/16/2019 CLINICAL DATA:  Acute kidney failure. EXAM: RENAL / URINARY TRACT ULTRASOUND COMPLETE COMPARISON:  None. FINDINGS: Right Kidney: Renal measurements: 9.7 x 3.7 x 4.3 cm = volume: 81 mL. Mild increased cortical echogenicity. No hydronephrosis or focal mass. Small focus of right perinephric fluid. Left Kidney: Renal measurements: 10.0 x 4.8 x 4.9 cm = volume: 123 mL. Echogenicity within normal limits. No mass or hydronephrosis visualized. 1.8 cm lower pole cyst. Bladder: Decompressed with presence of Foley catheter. Other: None. IMPRESSION: 1. Normal size kidneys without hydronephrosis. Mild increased right sided cortical echogenicity which could be seen with medical renal disease. Small amount of adjacent right perinephric fluid. 2.  1.8 cm left renal cyst. Electronically Signed   By: Marin Olp M.D.   On: 09/16/2019 11:53   US Venous Img Upper Uni Left (DVT)  Result Date: 09/16/2019 CLINICAL DATA:  74 year old female with a history of mottled skin EXAM: LEFT UPPER EXTREMITY VENOUS DOPPLER ULTRASOUND TECHNIQUE: Gray-scale sonography with graded compression, as well as color Doppler and duplex ultrasound were performed to evaluate the upper extremity deep venous system from the level of the subclavian vein and including the jugular, axillary, basilic, radial, ulnar and upper cephalic vein. Spectral Doppler was utilized to evaluate flow at rest and with distal augmentation maneuvers. COMPARISON:  None. FINDINGS: Contralateral Subclavian Vein: Respiratory phasicity is normal and symmetric with the symptomatic side. No evidence of thrombus. Normal compressibility. Internal Jugular Vein: No evidence of thrombus. Normal compressibility, respiratory phasicity and response to augmentation. Subclavian Vein: Partially occlusive thrombus of the left subclavian vein.  Axillary Vein: Occlusive thrombus of the axillary vein, noncompressible. Cephalic Vein: Occlusive thrombus of the cephalic vein, noncompressible. Basilic Vein: Occlusive thrombus of the basilic vein, noncompressive. Brachial Veins: Paired Brachial veins, both of which demonstrate occlusive thrombus. Radial Veins: Occlusive thrombus Ulnar Veins: Occlusive thrombus Other Findings:  None visualized. IMPRESSION: Sonographic survey of the left upper extremity positive for DVT of the subclavian vein, axillary vein, and paired brachial veins. Occlusive of the axillary and paired brachial veins, and nonocclusive at the subclavian vein. Superficial occlusive thrombophlebitis of the cephalic vein, basilic vein, radial vein, and ulnar vein. Electronically Signed   By: Corrie Mckusick D.O.   On: 09/16/2019 12:59   DG Chest Port 1 View  Result Date: 09/24/2019 CLINICAL DATA:  Acute respiratory failure EXAM: PORTABLE CHEST 1 VIEW COMPARISON:  September 17, 2019 FINDINGS: No pneumothorax. The heart size is borderline. The  hila and mediastinum are unchanged. New layering effusion with underlying atelectasis on the left. New dense opacity in the right base. No other acute abnormalities. IMPRESSION: 1. New layering effusion on the left. 2. New dense opacity in the right base may represent effusion with underlying atelectasis or pneumonia. Recommend short-term follow-up imaging to ensure resolution. Electronically Signed   By: Dorise Bullion III M.D   On: 09/24/2019 10:11   DG Chest Port 1 View  Result Date: 09/17/2019 CLINICAL DATA:  74 year old female with history of respiratory failure. EXAM: PORTABLE CHEST 1 VIEW COMPARISON:  Chest x-ray 09/15/2019. FINDINGS: An endotracheal tube is in place with tip 4.7 cm above the carina. Left IJ Vas-Cath with tip terminating in the distal superior vena cava. Nasogastric tube with tip terminating in the proximal stomach. Incompletely imaged catheter in the left upper quadrant, likely to  represent a left-sided ureteral stent. Lung volumes are normal. No consolidative airspace disease. No pleural effusions. No pneumothorax. No pulmonary nodule or mass noted. No evidence of pulmonary edema. Heart size is borderline enlarged. Upper mediastinal contours are unremarkable. Atherosclerosis in the thoracic aorta. IMPRESSION: 1. Support apparatus, as above. 2. No radiographic evidence of acute cardiopulmonary disease. 3. Aortic atherosclerosis. Electronically Signed   By: Vinnie Langton M.D.   On: 09/17/2019 04:57   DG Chest Port 1 View  Result Date: 09/15/2019 CLINICAL DATA:  Central line placement. EXAM: PORTABLE CHEST 1 VIEW COMPARISON:  September 15, 2019 (7:18 p.m.) FINDINGS: A left internal jugular venous catheter is seen. Its distal tip is noted within the distal aspect of the superior vena cava. This represents a new finding when compared to the prior study. There is stable endotracheal tube and nasogastric tube positioning. Mild, stable left lower lobe atelectasis and/or infiltrate is seen. No pleural effusion or pneumothorax is identified. The heart size and mediastinal contours are within normal limits. The visualized skeletal structures are unremarkable. IMPRESSION: 1. Interval left internal jugular venous catheter placement with its distal tip in the superior vena cava. 2. Stable left lower lobe atelectasis and/or infiltrate. Electronically Signed   By: Virgina Norfolk M.D.   On: 09/15/2019 23:10   DG Chest Port 1 View  Result Date: 09/15/2019 CLINICAL DATA:  Hypoxia EXAM: PORTABLE CHEST 1 VIEW COMPARISON:  09/15/2019 FINDINGS: Support devices are stable. Left lower lobe atelectasis or infiltrate with probable layering left effusion. Right lung clear. Mild hyperinflation. Heart is normal size. No acute bony abnormality. IMPRESSION: Hyperinflation. Continued left lower lobe atelectasis or infiltrate with suspected layering left effusion. Support devices stable. Electronically Signed   By:  Rolm Baptise M.D.   On: 09/15/2019 19:33   DG Chest Portable 1 View  Result Date: 09/15/2019 CLINICAL DATA:  Intubated EXAM: PORTABLE CHEST 1 VIEW COMPARISON:  09/15/2019, 11/21/2017 FINDINGS: Interval intubation, tip of the endotracheal tube is about 2 cm superior to carina. Esophageal tube tip and side port overlie the mid gastric region. Right lung is clear. Mediastinal shift to the left consistent with volume loss. Diffuse opacity in the left thorax likely due to combination of pleural effusion and airspace disease. IMPRESSION: 1. Endotracheal tube tip about 2 cm superior to carina. Esophageal tube tip and side port overlie the mid stomach. 2. Volume loss in the left thorax with diffuse opacity in the left thorax, likely due to combination of pleural effusion and airspace disease. Central obstructing lesion is a concern, consider correlation with contrast-enhanced chest CT. Electronically Signed   By: Donavan Foil M.D.   On:  09/15/2019 15:40   ECHOCARDIOGRAM COMPLETE  Result Date: 09/16/2019    ECHOCARDIOGRAM REPORT   Patient Name:   Pamela James Date of Exam: 09/16/2019 Medical Rec #:  [id number]  Height:       66.0 in Accession #:    [id number] Weight:       154.1 lb Date of Birth:  Dec 20, 1945   BSA:          1.790 m Patient Age:    39 years   BP:           122/52 mmHg Patient Gender: F          HR:           74 bpm. 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  Sonographer:     Arville Go RDCS Referring Phys:  [id number] Bradly Bienenstock Diagnosing Phys: Yolonda Kida MD  Sonographer Comments: Echo performed with patient supine and on artificial respirator. IMPRESSIONS  1. Left ventricular ejection fraction, by estimation, is 60 to 65%. The left ventricle has normal function. The left ventricle has no regional wall motion abnormalities. Left ventricular  diastolic parameters were normal.  2. Right ventricular systolic function is normal. The right ventricular size is normal.  3. The mitral valve is grossly normal. No evidence of mitral valve regurgitation.  4. The aortic valve is grossly normal. Aortic valve regurgitation is not visualized. FINDINGS  Left Ventricle: Left ventricular ejection fraction, by estimation, is 60 to 65%. The left ventricle has normal function. The left ventricle has no regional wall motion abnormalities. The left ventricular internal cavity size was normal in size. There is  borderline concentric left ventricular hypertrophy. Left ventricular diastolic parameters were normal. Right Ventricle: The right ventricular size is normal. No increase in right ventricular wall thickness. Right ventricular systolic function is normal. Left Atrium: Left atrial size was normal in size. Right Atrium: Right atrial size was normal in size. Pericardium: There is no evidence of pericardial effusion. Mitral Valve: The mitral valve is grossly normal. There is mild prolapse of of the mitral valve. There is mild thickening of the mitral valve leaflet(s). Normal mobility of the mitral valve leaflets. No evidence of mitral valve regurgitation. Tricuspid Valve: The tricuspid valve is normal in structure. Tricuspid valve regurgitation is trivial. Aortic Valve: The aortic valve is grossly normal. Aortic valve regurgitation is not visualized. Aortic valve peak gradient measures 4.4 mmHg. Pulmonic Valve: The pulmonic valve was grossly normal. Pulmonic valve regurgitation is not visualized. Aorta: The aortic root is normal in size and structure. IAS/Shunts: No atrial level shunt detected by color flow Doppler.  LEFT VENTRICLE PLAX 2D LVIDd:         3.91 cm  Diastology LVIDs:         2.69 cm  LV e' lateral:   7.29 cm/s LV PW:         1.09 cm  LV E/e' lateral: 7.2 LV IVS:        1.11 cm  LV e' medial:    5.33 cm/s LVOT diam:     2.00 cm  LV E/e' medial:  9.8 LV SV:          46 LV SV Index:   26  LVOT Area:     3.14 cm  RIGHT VENTRICLE RV Basal diam:  2.97 cm TAPSE (M-mode): 1.1 cm LEFT ATRIUM             Index       RIGHT ATRIUM          Index LA diam:        3.40 cm 1.90 cm/m  RA Area:     8.09 cm LA Vol (A2C):   16.3 ml 9.11 ml/m  RA Volume:   16.80 ml 9.39 ml/m LA Vol (A4C):   20.1 ml 11.23 ml/m LA Biplane Vol: 18.9 ml 10.56 ml/m  AORTIC VALVE                PULMONIC VALVE AV Area (Vmax): 2.05 cm    PV Vmax:       0.92 m/s AV Vmax:        105.00 cm/s PV Peak grad:  3.4 mmHg AV Peak Grad:   4.4 mmHg LVOT Vmax:      68.50 cm/s LVOT Vmean:     51.900 cm/s LVOT VTI:       0.146 m  AORTA Ao Root diam: 2.70 cm MITRAL VALVE               TRICUSPID VALVE MV Area (PHT): 2.73 cm    TV Peak grad:   29.8 mmHg MV Decel Time: 278 msec    TV Vmax:        2.73 m/s MV E velocity: 52.30 cm/s MV A velocity: 47.10 cm/s  SHUNTS MV E/A ratio:  1.11        Systemic VTI:  0.15 m                            Systemic Diam: 2.00 cm Dwayne Prince Rome MD Electronically signed by Yolonda Kida MD Signature Date/Time: 09/16/2019/11:11:40 AM    Final        Subjective: Patient seen up in the chair this AM.  She says she feels good.  Denies any CP, SOB or other complaints.  Agreeable with [facility] follow up.  No acute issues overnight.   Discharge Exam: Vitals:   09/27/19 0436 09/27/19 0750  BP: (!) 174/55 (!) 167/59  Pulse: 82 80  Resp:  17  Temp: 98.8 F (37.1 C) 98 F (36.7 C)  SpO2: 95% 100%   Vitals:   09/26/19 2020 09/26/19 2357 09/27/19 0436 09/27/19 0750  BP: (!) 169/56 (!) 160/54 (!) 174/55 (!) 167/59  Pulse: 79 72 82 80  Resp:    17  Temp: 99.1 F (37.3 C)  98.8 F (37.1 C) 98 F (36.7 C)  TempSrc: Oral  Oral Oral  SpO2: 93% 96% 95% 100%  Weight:   61.1 kg   Height:        General: Pt is alert, awake, not in acute distress Cardiovascular: RRR, S1/S2 +, no rubs, no gallops Respiratory: CTA bilaterally, no wheezing, no rhonchi Abdominal: Soft, NT, ND,  bowel sounds + Extremities: no edema, no cyanosis    The results of significant diagnostics from this hospitalization (including imaging, microbiology, ancillary and laboratory) are listed below for reference.     Microbiology: Recent Results (from the past 240 hour(s))  CULTURE, BLOOD (ROUTINE X 2) w Reflex to ID Panel     Status: None   Collection Time: 09/18/19  9:13 AM   Specimen: BLOOD  Result Value Ref  Range Status   Specimen Description BLOOD BLOOD RIGHT HAND  Final   Special Requests   Final    BOTTLES DRAWN AEROBIC AND ANAEROBIC Blood Culture adequate volume   Culture   Final    NO GROWTH 5 DAYS Performed at [facility], [address] [place], [place] [address]    Report Status 09/23/2019 FINAL  Final  CULTURE, BLOOD (ROUTINE X 2) w Reflex to ID Panel     Status: None   Collection Time: 09/18/19  9:45 AM   Specimen: BLOOD  Result Value Ref Range Status   Specimen Description BLOOD BLOOD RIGHT FOREARM  Final   Special Requests   Final    BOTTLES DRAWN AEROBIC AND ANAEROBIC Blood Culture adequate volume   Culture   Final    NO GROWTH 5 DAYS Performed at [facility], [address] [place], [place] [address]    Report Status 09/23/2019 FINAL  Final     Labs: BNP (last 3 results) Recent Labs    09/15/19 1513 09/15/19 1725  BNP 872.0* 378.5*   Basic Metabolic Panel: Recent Labs  Lab 09/21/19 0440 09/21/19 0440 09/22/19 0521 09/23/19 0710 09/24/19 0433 09/25/19 0730 09/27/19 0519  NA 143   < > 142  142 142 142 139 141  K 4.2   < > 3.7  3.6 3.5 3.9 4.0 3.9  CL 104   < > 108  107 107 111 108 108  CO2 28   < > 26  25 26 24 25 26   GLUCOSE 106*   < > 92  94 100* 96 94 103*  BUN 25*   < > 20  21 16 17  26* 25*  CREATININE 1.00   < > 0.90  0.86 0.72 0.87 0.81 0.83  CALCIUM 9.4   < > 9.6  9.5 9.7 9.5 9.8 9.7  MG 2.1  --  2.1  --   --   --   --   PHOS 2.9  --  3.0  --   --   --   --    < > = values in this interval not  displayed.   Liver Function Tests: Recent Labs  Lab 09/21/19 0440 09/22/19 0521  ALBUMIN 3.1* 3.1*   No results for input(s): LIPASE, AMYLASE in the last 168 hours. No results for input(s): AMMONIA in the last 168 hours. CBC: Recent Labs  Lab 09/21/19 0440 09/22/19 0521 09/23/19 0710 09/25/19 0730 09/27/19 0519  WBC 12.1* 12.7* 11.9* 10.3 9.3  HGB 9.4* 9.3* 9.1* 9.1* 9.0*  HCT 27.0* 27.7* 26.7* 26.7* 26.6*  MCV 89.7 94.2 92.7 92.1 93.3  PLT 456* 468* 497* 623* 700*   Cardiac Enzymes: No results for input(s): CKTOTAL, CKMB, CKMBINDEX, TROPONINI in the last 168 hours. BNP: Invalid input(s): POCBNP CBG: Recent Labs  Lab 09/26/19 1004 09/26/19 1210 09/26/19 1710 09/26/19 2126 09/27/19 0751  GLUCAP 114* 84 85 113* 94   D-Dimer No results for input(s): DDIMER in the last 72 hours. Hgb A1c No results for input(s): HGBA1C in the last 72 hours. Lipid Profile No results for input(s): CHOL, HDL, LDLCALC, TRIG, CHOLHDL, LDLDIRECT in the last 72 hours. Thyroid function studies No results for input(s): TSH, T4TOTAL, T3FREE, THYROIDAB in the last 72 hours.  Invalid input(s): FREET3 Anemia work up No results for input(s): VITAMINB12, FOLATE, FERRITIN, TIBC, IRON, RETICCTPCT in the last 72 hours. Urinalysis    Component Value Date/Time   COLORURINE AMBER (A) 09/24/2019 1105   APPEARANCEUR  CLOUDY (A) 09/24/2019 1105   LABSPEC 1.018 09/24/2019 1105   PHURINE 6.0 09/24/2019 1105   GLUCOSEU NEGATIVE 09/24/2019 1105   HGBUR LARGE (A) 09/24/2019 1105   BILIRUBINUR NEGATIVE 09/24/2019 1105   KETONESUR NEGATIVE 09/24/2019 1105   PROTEINUR >=300 (A) 09/24/2019 1105   NITRITE NEGATIVE 09/24/2019 1105   LEUKOCYTESUR SMALL (A) 09/24/2019 1105   Sepsis Labs Invalid input(s): PROCALCITONIN,  WBC,  LACTICIDVEN Microbiology Recent Results (from the past 240 hour(s))  CULTURE, BLOOD (ROUTINE X 2) w Reflex to ID Panel     Status: None   Collection Time: 09/18/19  9:13 AM    Specimen: BLOOD  Result Value Ref Range Status   Specimen Description BLOOD BLOOD RIGHT HAND  Final   Special Requests   Final    BOTTLES DRAWN AEROBIC AND ANAEROBIC Blood Culture adequate volume   Culture   Final    NO GROWTH 5 DAYS Performed at [facility], [address] [place], [place] [address]    Report Status 09/23/2019 FINAL  Final  CULTURE, BLOOD (ROUTINE X 2) w Reflex to ID Panel     Status: None   Collection Time: 09/18/19  9:45 AM   Specimen: BLOOD  Result Value Ref Range Status   Specimen Description BLOOD BLOOD RIGHT FOREARM  Final   Special Requests   Final    BOTTLES DRAWN AEROBIC AND ANAEROBIC Blood Culture adequate volume   Culture   Final    NO GROWTH 5 DAYS Performed at [facility], [address], [place], [place] [address]    Report Status 09/23/2019 FINAL  Final     Time coordinating discharge: Over 30 minutes  SIGNED:   Ezekiel Slocumb, DO [facility] 09/27/2019, 9:59 AM   If 7PM-7AM, please contact night-coverage [email or web address]

## 2019-09-27 NOTE — Discharge Instructions (Signed)
Aspiration Pneumonia Aspiration pneumonia is an infection in the lungs. It occurs when saliva or liquid contaminated with bacteria is inhaled (aspirated) into the lungs. When these things get into the lungs, swelling (inflammation) and infection can occur. This can make it difficult to breathe. Aspiration pneumonia is a serious condition and can be life threatening. What are the causes? This condition is caused when saliva or liquid from the mouth, throat, or stomach is inhaled into the lungs, and when those fluids are contaminated with bacteria. What increases the risk? The following factors may make you more likely to develop this condition:  A narrowing of the tube that carries food to the stomach (esophageal narrowing).  Having gastroesophageal reflux disease (GERD).  Having a weak immune system.  Having diabetes.  Having poor oral hygiene.  Being malnourished. The condition is more likely to occur when a person's cough (gag) reflex, or ability to swallow, has decreased. Some things that can cause this decrease include:  Having a brain injury or disease, such as stroke, seizures, Parkinson disease, dementia, or amyotrophic lateral sclerosis (ALS).  Being given a general anesthetic for procedures.  Drinking too much alcohol. If a person passes out and vomits, vomit can be inhaled into the lungs.  Taking certain medicines, such as tranquilizers or sedatives. What are the signs or symptoms? Symptoms of this condition include:  Fever.  A cough with secretions that are yellow, tan, or green.  Breathing problems, such as wheezing or shortness of breath.  Chest pain.  Being more tired than usual (fatigue).  Having a history of coughing while eating or drinking.  Bad breath.  Bluish color to the lips, skin, or fingers. How is this diagnosed? This condition may be diagnosed based on:  A physical exam.  Tests, such as: ? Chest X-ray. ? Sputum culture. Saliva and mucus  (sputum) are collected from the lungs or the tubes that carry air to the lungs (bronchi). The sputum is then tested for bacteria. ? Oximetry. A sensor or clip is placed on areas such as a finger, earlobe, or toe to measure the oxygen level in your blood. ? Blood tests. ? Swallowing study. This test looks at how food is swallowed and whether it goes into your breathing tube (trachea) or esophagus. ? Bronchoscopy. This test uses a flexible tube (bronchoscope) to see inside the lungs. How is this treated? This condition may be treated with:  Medicines. Antibiotic medicine will be given to kill the pneumonia bacteria. Other medicines may also be used to reduce fever or pain.  Breathing assistance and oxygen therapy. Depending on how well you are breathing, you may need to be given oxygen, or you may need breathing support from a breathing machine (ventilator).  Thoracentesis. This is a procedure to remove fluid that has built up in the space between the linings of the chest wall and the lungs.  Feeding tube and diet change. For people who have difficulty swallowing, a feeding tube might be placed in the stomach, or they may be asked to avoid certain food textures or liquids when eating. Follow these instructions at home: Medicines  Take over-the-counter and prescription medicines only as told by your health care provider. ? If you were prescribed an antibiotic medicine, take it as told by your health care provider. Do not stop taking the antibiotic even if you start to feel better. ? Take cough medicine only if you are losing sleep. Cough medicine can prevent your body's natural ability to remove mucus   from your lungs. General instructions  Carefully follow any eating instructions you were given, such as avoiding certain food textures or thickening your liquids. Thickening liquids reduces the risk of developing aspiration pneumonia again.  Use breathing exercises such as postural drainage, deep  breathing, and incentive spirometry to help expel secretions.  Rest as instructed by your health care provider.  Sleep in a semi-upright position at night. Try to sleep in a reclining chair, or place a few pillows under your head.  Do not use any products that contain nicotine or tobacco, such as cigarettes and e-cigarettes. If you need help quitting, ask your health care provider.  Keep all follow-up visits as told by your health care provider. This is important. Contact a health care provider if:  You have a fever.  You have a worsening cough with yellow, tan, or green secretions.  You have coughing while eating or drinking. Get help right away if:  You have worsening shortness of breath, wheezing, or difficulty breathing.  You have chest pain. Summary  Aspiration pneumonia is an infection in the lungs. It is caused when saliva or liquid from the mouth, throat, or stomach is inhaled into the lungs.  Aspiration pneumonia is more likely to occur when a person's cough reflex or ability to swallow has decreased.  Symptoms of aspiration pneumonia include coughing, breathing problems, fever, and chest pain.  Aspiration pneumonia may be treated with antibiotic medicine, other medicines to reduce pain or fever, and breathing assistance or oxygen therapy. This information is not intended to replace advice given to you by your health care provider. Make sure you discuss any questions you have with your health care provider. Document Revised: 02/12/2017 Document Reviewed: 04/07/2016 Elsevier Patient Education  2020 [employer]vier Inc.  

## 2019-09-27 NOTE — TOC Transition Note (Signed)
Transition of Care [facility]) - CM/SW Discharge Note   Patient Details  Name: Pamela James MRN: [id number] Date of Birth: Apr 02, 1945  Transition of Care [facility] CM/SW Contact:  Meriel Flavors, LCSW Phone Number: 09/27/2019, 4:31 PM   Clinical Narrative:    TOC unable to secure [facility] for this patient due to Insurance provider. CSW contacted Zack, he requested I send him her information and he would take care of it. I emailed her info to him      Barriers to Discharge: Continued Medical Work up   Patient Goals and CMS Choice   CMS Medicare.gov Compare Post Acute Care list provided to:: Patient    Discharge Placement                       Discharge Plan and Services     Post Acute Care Choice: Home Health                               Social Determinants of Health (SDOH) Interventions     Readmission Risk Interventions No flowsheet data found.

## 2019-09-28 NOTE — Progress Notes (Signed)
Clinical information and orders for HHPT faxed to [facility]; they are assisting with finding a [facility] agency for the patient;  [place] Supervisor [phone]

## 2019-10-05 ENCOUNTER — Encounter: Payer: Self-pay | Admitting: *Deleted

## 2019-10-10 ENCOUNTER — Ambulatory Visit
Admission: RE | Admit: 2019-10-10 | Discharge: 2019-10-10 | Disposition: A | Discharge status: Home / Self Care | Payer: Medicare HMO | Source: Ambulatory Visit | Attending: Oncology | Admitting: Oncology

## 2019-10-10 ENCOUNTER — Encounter: Payer: Self-pay | Admitting: Nurse Practitioner

## 2019-10-10 ENCOUNTER — Inpatient Hospital Stay: Payer: Medicare HMO | Attending: Nurse Practitioner | Admitting: Nurse Practitioner

## 2019-10-10 ENCOUNTER — Other Ambulatory Visit: Payer: Self-pay

## 2019-10-10 DIAGNOSIS — Z87891 Personal history of nicotine dependence: Secondary | ICD-10-CM

## 2019-10-10 DIAGNOSIS — F17210 Nicotine dependence, cigarettes, uncomplicated: Secondary | ICD-10-CM | POA: Diagnosis not present

## 2019-10-10 DIAGNOSIS — Z122 Encounter for screening for malignant neoplasm of respiratory organs: Secondary | ICD-10-CM

## 2019-10-10 NOTE — Progress Notes (Signed)
Virtual Visit via Video Enabled Telemedicine Note   I connected with Pamela James on 10/10/19 at 2:30 PM EST by video enabled telemedicine visit and verified that I am speaking with the correct person using two identifiers.   I discussed the limitations, risks, security and privacy concerns of performing an evaluation and management service by telemedicine and the availability of in-person appointments. I also discussed with the patient that there may be a patient responsible charge related to this service. The patient expressed understanding and agreed to proceed.   Other persons participating in the visit and their role in the encounter: Burgess Estelle, RN- checking in patient & navigation  Patient's location: [place]  Provider's location: Clinic  Chief Complaint: Low Dose CT Screening  Patient agreed to evaluation by telemedicine to discuss shared decision making for consideration of low dose CT lung cancer screening.    In accordance with CMS guidelines, patient has met eligibility criteria including age, absence of signs or symptoms of lung cancer.  Social History   Tobacco Use  . Smoking status: Current Every Day Smoker    Packs/day: 0.75    Years: 40.00    Pack years: 30.00    Types: Cigarettes  . Smokeless tobacco: Never Used  Substance Use Topics  . Alcohol use: No     A shared decision-making session was conducted prior to the performance of CT scan. This includes one or more decision aids, includes benefits and harms of screening, follow-up diagnostic testing, over-diagnosis, false positive rate, and total radiation exposure.   Counseling on the importance of adherence to annual lung cancer LDCT screening, impact of co-morbidities, and ability or willingness to undergo diagnosis and treatment is imperative for compliance of the program.   Counseling on the importance of continued smoking cessation for former smokers; the importance of smoking cessation for current  smokers, and information about tobacco cessation interventions have been given to patient including [place] and [employer] [place] programs.   Written order for lung cancer screening with LDCT has been given to the patient and any and all questions have been answered to the best of my abilities.    Yearly follow up will be coordinated by Burgess Estelle, Thoracic Navigator.  I discussed the assessment and treatment plan with the patient. The patient was provided an opportunity to ask questions and all were answered. The patient agreed with the plan and demonstrated an understanding of the instructions.   The patient was advised to call back or seek an in-person evaluation if the symptoms worsen or if the condition fails to improve as anticipated.   I provided 15 minutes of face-to-face video visit time during this encounter, and > 50% was spent counseling as documented under my assessment & plan.   Beckey Rutter, DNP, AGNP-C [place] at [facility] [phone] (clinic)

## 2019-10-12 ENCOUNTER — Telehealth: Payer: Self-pay | Admitting: *Deleted

## 2019-10-12 NOTE — Telephone Encounter (Signed)
Notified patient of LDCT lung cancer screening program results with recommendation for 12 month follow up imaging. Also notified of incidental findings noted below and is encouraged to discuss further with PCP who will receive a copy of this note and/or the CT report. Patient verbalizes understanding. Especially noted the importance of clinical evaluation and follow up of potential infection.    IMPRESSION: Lung-RADS 2, benign appearance or behavior. Continue annual screening with low-dose chest CT without contrast in 12 months.  Additional focal patchy opacity in the medial left lower lobe, possibly round atelectasis, although mild infection/pneumonia could also have this appearance. Consider follow-up CT chest in 3 months to document clearance.  Aortic Atherosclerosis (ICD10-I70.0) and Emphysema (ICD10-J43.9).

## 2019-11-09 ENCOUNTER — Other Ambulatory Visit: Payer: Self-pay

## 2019-11-09 ENCOUNTER — Emergency Department: Payer: Medicare HMO

## 2019-11-09 ENCOUNTER — Inpatient Hospital Stay
Admission: EM | Admit: 2019-11-09 | Discharge: 2019-11-11 | DRG: 291 | Disposition: A | Discharge status: Home Health | Payer: Medicare HMO | Attending: Student | Admitting: Student

## 2019-11-09 ENCOUNTER — Encounter: Payer: Self-pay | Admitting: Intensive Care

## 2019-11-09 DIAGNOSIS — Z7951 Long term (current) use of inhaled steroids: Secondary | ICD-10-CM

## 2019-11-09 DIAGNOSIS — Z20822 Contact with and (suspected) exposure to covid-19: Secondary | ICD-10-CM | POA: Diagnosis present

## 2019-11-09 DIAGNOSIS — E871 Hypo-osmolality and hyponatremia: Secondary | ICD-10-CM | POA: Diagnosis present

## 2019-11-09 DIAGNOSIS — R5381 Other malaise: Secondary | ICD-10-CM | POA: Diagnosis not present

## 2019-11-09 DIAGNOSIS — R9431 Abnormal electrocardiogram [ECG] [EKG]: Secondary | ICD-10-CM | POA: Diagnosis not present

## 2019-11-09 DIAGNOSIS — R54 Age-related physical debility: Secondary | ICD-10-CM | POA: Diagnosis present

## 2019-11-09 DIAGNOSIS — Z886 Allergy status to analgesic agent status: Secondary | ICD-10-CM

## 2019-11-09 DIAGNOSIS — R001 Bradycardia, unspecified: Secondary | ICD-10-CM

## 2019-11-09 DIAGNOSIS — N139 Obstructive and reflux uropathy, unspecified: Secondary | ICD-10-CM | POA: Diagnosis not present

## 2019-11-09 DIAGNOSIS — Z7983 Long term (current) use of bisphosphonates: Secondary | ICD-10-CM

## 2019-11-09 DIAGNOSIS — Z96642 Presence of left artificial hip joint: Secondary | ICD-10-CM | POA: Diagnosis present

## 2019-11-09 DIAGNOSIS — I509 Heart failure, unspecified: Secondary | ICD-10-CM | POA: Diagnosis not present

## 2019-11-09 DIAGNOSIS — J449 Chronic obstructive pulmonary disease, unspecified: Secondary | ICD-10-CM | POA: Diagnosis present

## 2019-11-09 DIAGNOSIS — R0902 Hypoxemia: Secondary | ICD-10-CM | POA: Diagnosis not present

## 2019-11-09 DIAGNOSIS — R778 Other specified abnormalities of plasma proteins: Secondary | ICD-10-CM

## 2019-11-09 DIAGNOSIS — I442 Atrioventricular block, complete: Secondary | ICD-10-CM | POA: Diagnosis present

## 2019-11-09 DIAGNOSIS — Z9114 Patient's other noncompliance with medication regimen: Secondary | ICD-10-CM

## 2019-11-09 DIAGNOSIS — J811 Chronic pulmonary edema: Secondary | ICD-10-CM

## 2019-11-09 DIAGNOSIS — D509 Iron deficiency anemia, unspecified: Secondary | ICD-10-CM | POA: Diagnosis present

## 2019-11-09 DIAGNOSIS — R638 Other symptoms and signs concerning food and fluid intake: Secondary | ICD-10-CM | POA: Diagnosis not present

## 2019-11-09 DIAGNOSIS — J810 Acute pulmonary edema: Secondary | ICD-10-CM | POA: Diagnosis not present

## 2019-11-09 DIAGNOSIS — Z9189 Other specified personal risk factors, not elsewhere classified: Secondary | ICD-10-CM | POA: Diagnosis not present

## 2019-11-09 DIAGNOSIS — F419 Anxiety disorder, unspecified: Secondary | ICD-10-CM | POA: Diagnosis present

## 2019-11-09 DIAGNOSIS — E875 Hyperkalemia: Secondary | ICD-10-CM | POA: Diagnosis present

## 2019-11-09 DIAGNOSIS — N132 Hydronephrosis with renal and ureteral calculous obstruction: Secondary | ICD-10-CM | POA: Diagnosis present

## 2019-11-09 DIAGNOSIS — Z7901 Long term (current) use of anticoagulants: Secondary | ICD-10-CM

## 2019-11-09 DIAGNOSIS — G629 Polyneuropathy, unspecified: Secondary | ICD-10-CM | POA: Diagnosis present

## 2019-11-09 DIAGNOSIS — Z87891 Personal history of nicotine dependence: Secondary | ICD-10-CM

## 2019-11-09 DIAGNOSIS — E210 Primary hyperparathyroidism: Secondary | ICD-10-CM | POA: Diagnosis present

## 2019-11-09 DIAGNOSIS — Z8674 Personal history of sudden cardiac arrest: Secondary | ICD-10-CM

## 2019-11-09 DIAGNOSIS — I110 Hypertensive heart disease with heart failure: Principal | ICD-10-CM | POA: Diagnosis present

## 2019-11-09 DIAGNOSIS — D4122 Neoplasm of uncertain behavior of left ureter: Secondary | ICD-10-CM | POA: Diagnosis not present

## 2019-11-09 DIAGNOSIS — I5033 Acute on chronic diastolic (congestive) heart failure: Secondary | ICD-10-CM | POA: Diagnosis present

## 2019-11-09 DIAGNOSIS — J432 Centrilobular emphysema: Secondary | ICD-10-CM | POA: Diagnosis not present

## 2019-11-09 DIAGNOSIS — R7303 Prediabetes: Secondary | ICD-10-CM | POA: Diagnosis present

## 2019-11-09 DIAGNOSIS — F17200 Nicotine dependence, unspecified, uncomplicated: Secondary | ICD-10-CM | POA: Diagnosis not present

## 2019-11-09 DIAGNOSIS — Z888 Allergy status to other drugs, medicaments and biological substances status: Secondary | ICD-10-CM

## 2019-11-09 DIAGNOSIS — J9621 Acute and chronic respiratory failure with hypoxia: Secondary | ICD-10-CM | POA: Diagnosis present

## 2019-11-09 DIAGNOSIS — Z86718 Personal history of other venous thrombosis and embolism: Secondary | ICD-10-CM | POA: Diagnosis not present

## 2019-11-09 DIAGNOSIS — F319 Bipolar disorder, unspecified: Secondary | ICD-10-CM | POA: Diagnosis present

## 2019-11-09 DIAGNOSIS — N1330 Unspecified hydronephrosis: Secondary | ICD-10-CM | POA: Diagnosis not present

## 2019-11-09 DIAGNOSIS — R0600 Dyspnea, unspecified: Secondary | ICD-10-CM

## 2019-11-09 DIAGNOSIS — N179 Acute kidney failure, unspecified: Secondary | ICD-10-CM | POA: Diagnosis not present

## 2019-11-09 DIAGNOSIS — J9601 Acute respiratory failure with hypoxia: Secondary | ICD-10-CM | POA: Diagnosis present

## 2019-11-09 DIAGNOSIS — K219 Gastro-esophageal reflux disease without esophagitis: Secondary | ICD-10-CM | POA: Diagnosis present

## 2019-11-09 DIAGNOSIS — N170 Acute kidney failure with tubular necrosis: Secondary | ICD-10-CM | POA: Diagnosis present

## 2019-11-09 DIAGNOSIS — D472 Monoclonal gammopathy: Secondary | ICD-10-CM | POA: Diagnosis present

## 2019-11-09 DIAGNOSIS — Z7982 Long term (current) use of aspirin: Secondary | ICD-10-CM

## 2019-11-09 DIAGNOSIS — Z79899 Other long term (current) drug therapy: Secondary | ICD-10-CM

## 2019-11-09 DIAGNOSIS — D638 Anemia in other chronic diseases classified elsewhere: Secondary | ICD-10-CM | POA: Diagnosis present

## 2019-11-09 DIAGNOSIS — Z8249 Family history of ischemic heart disease and other diseases of the circulatory system: Secondary | ICD-10-CM

## 2019-11-09 DIAGNOSIS — Z90710 Acquired absence of both cervix and uterus: Secondary | ICD-10-CM

## 2019-11-09 DIAGNOSIS — D649 Anemia, unspecified: Secondary | ICD-10-CM | POA: Diagnosis not present

## 2019-11-09 DIAGNOSIS — E785 Hyperlipidemia, unspecified: Secondary | ICD-10-CM | POA: Diagnosis present

## 2019-11-09 DIAGNOSIS — C689 Malignant neoplasm of urinary organ, unspecified: Secondary | ICD-10-CM | POA: Diagnosis present

## 2019-11-09 DIAGNOSIS — R531 Weakness: Secondary | ICD-10-CM | POA: Diagnosis not present

## 2019-11-09 LAB — URINALYSIS, COMPLETE (UACMP) WITH MICROSCOPIC
Bacteria, UA: NONE SEEN
Bilirubin Urine: NEGATIVE
Glucose, UA: NEGATIVE mg/dL
Hgb urine dipstick: NEGATIVE
Ketones, ur: NEGATIVE mg/dL
Leukocytes,Ua: NEGATIVE
Nitrite: NEGATIVE
Protein, ur: NEGATIVE mg/dL
Specific Gravity, Urine: 1.006 (ref 1.005–1.030)
Squamous Epithelial / HPF: NONE SEEN (ref 0–5)
pH: 5 (ref 5.0–8.0)

## 2019-11-09 LAB — CBC
HCT: 27.2 % — ABNORMAL LOW (ref 36.0–46.0)
Hemoglobin: 9.1 g/dL — ABNORMAL LOW (ref 12.0–15.0)
MCH: 30.8 pg (ref 26.0–34.0)
MCHC: 33.5 g/dL (ref 30.0–36.0)
MCV: 92.2 fL (ref 80.0–100.0)
Platelets: 616 10*3/uL — ABNORMAL HIGH (ref 150–400)
RBC: 2.95 MIL/uL — ABNORMAL LOW (ref 3.87–5.11)
RDW: 15.3 % (ref 11.5–15.5)
WBC: 7.6 10*3/uL (ref 4.0–10.5)
nRBC: 0.3 % — ABNORMAL HIGH (ref 0.0–0.2)

## 2019-11-09 LAB — HEPATIC FUNCTION PANEL
ALT: 31 U/L (ref 0–44)
AST: 26 U/L (ref 15–41)
Albumin: 3.8 g/dL (ref 3.5–5.0)
Alkaline Phosphatase: 87 U/L (ref 38–126)
Bilirubin, Direct: 0.1 mg/dL (ref 0.0–0.2)
Indirect Bilirubin: 0.8 mg/dL (ref 0.3–0.9)
Total Bilirubin: 0.9 mg/dL (ref 0.3–1.2)
Total Protein: 6.6 g/dL (ref 6.5–8.1)

## 2019-11-09 LAB — BASIC METABOLIC PANEL
Anion gap: 8 (ref 5–15)
BUN: 51 mg/dL — ABNORMAL HIGH (ref 8–23)
CO2: 23 mmol/L (ref 22–32)
Calcium: 8.9 mg/dL (ref 8.9–10.3)
Chloride: 90 mmol/L — ABNORMAL LOW (ref 98–111)
Creatinine, Ser: 1.67 mg/dL — ABNORMAL HIGH (ref 0.44–1.00)
GFR calc Af Amer: 34 mL/min — ABNORMAL LOW (ref >60)
GFR calc non Af Amer: 30 mL/min — ABNORMAL LOW (ref >60)
Glucose, Bld: 122 mg/dL — ABNORMAL HIGH (ref 70–99)
Potassium: 6.4 mmol/L (ref 3.5–5.1)
Sodium: 121 mmol/L — ABNORMAL LOW (ref 135–145)

## 2019-11-09 LAB — SARS CORONAVIRUS 2 BY RT PCR (HOSPITAL ORDER, PERFORMED IN ~~LOC~~ HOSPITAL LAB): SARS Coronavirus 2: NEGATIVE

## 2019-11-09 LAB — LACTIC ACID, PLASMA: Lactic Acid, Venous: 1.1 mmol/L (ref 0.5–1.9)

## 2019-11-09 LAB — POTASSIUM: Potassium: 4.8 mmol/L (ref 3.5–5.1)

## 2019-11-09 LAB — BRAIN NATRIURETIC PEPTIDE: B Natriuretic Peptide: 860.2 pg/mL — ABNORMAL HIGH (ref 0.0–100.0)

## 2019-11-09 LAB — TROPONIN I (HIGH SENSITIVITY)
Troponin I (High Sensitivity): 18 ng/L — ABNORMAL HIGH (ref <18)
Troponin I (High Sensitivity): 18 ng/L — ABNORMAL HIGH (ref <18)

## 2019-11-09 MED ORDER — ONDANSETRON HCL 4 MG PO TABS: 4.0000 mg | ORAL_TABLET | Freq: Four times a day (QID) | ORAL | Status: DC | PRN

## 2019-11-09 MED ORDER — ALBUTEROL SULFATE (2.5 MG/3ML) 0.083% IN NEBU
2.5000 mg | INHALATION_SOLUTION | RESPIRATORY_TRACT | Status: DC | PRN
  Administered 2019-11-10: 2.5 mg via RESPIRATORY_TRACT
  Filled 2019-11-09: qty 3

## 2019-11-09 MED ORDER — PATIROMER SORBITEX CALCIUM 8.4 G PO PACK
16.8000 g | PACK | Freq: Every day | ORAL | Status: DC
  Administered 2019-11-09 – 2019-11-11 (×3): 16.8 g via ORAL
  Filled 2019-11-09 (×4): qty 2

## 2019-11-09 MED ORDER — INSULIN ASPART 100 UNIT/ML ~~LOC~~ SOLN
5.0000 [IU] | Freq: Once | SUBCUTANEOUS | Status: AC
  Administered 2019-11-09: 5 [IU] via INTRAVENOUS
  Filled 2019-11-09: qty 1

## 2019-11-09 MED ORDER — GABAPENTIN 300 MG PO CAPS
300.0000 mg | ORAL_CAPSULE | Freq: Two times a day (BID) | ORAL | Status: DC
  Administered 2019-11-09: 300 mg via ORAL
  Filled 2019-11-09: qty 1

## 2019-11-09 MED ORDER — FUROSEMIDE 10 MG/ML IJ SOLN
20.0000 mg | Freq: Once | INTRAMUSCULAR | Status: AC
  Administered 2019-11-09: 20 mg via INTRAVENOUS
  Filled 2019-11-09: qty 4

## 2019-11-09 MED ORDER — VITAMIN B-12 1000 MCG PO TABS
1000.0000 ug | ORAL_TABLET | Freq: Every day | ORAL | Status: DC
  Administered 2019-11-10 – 2019-11-11 (×2): 1000 ug via ORAL
  Filled 2019-11-09 (×3): qty 1

## 2019-11-09 MED ORDER — DOCUSATE SODIUM 100 MG PO CAPS
100.0000 mg | ORAL_CAPSULE | Freq: Two times a day (BID) | ORAL | Status: DC
  Administered 2019-11-10 – 2019-11-11 (×3): 100 mg via ORAL
  Filled 2019-11-09 (×4): qty 1

## 2019-11-09 MED ORDER — CALCIUM GLUCONATE 10 % IV SOLN
1.0000 g | Freq: Once | INTRAVENOUS | Status: AC
  Administered 2019-11-09: 1 g via INTRAVENOUS
  Filled 2019-11-09: qty 10

## 2019-11-09 MED ORDER — ACETAMINOPHEN 325 MG PO TABS
650.0000 mg | ORAL_TABLET | Freq: Four times a day (QID) | ORAL | Status: DC | PRN
  Administered 2019-11-10: 650 mg via ORAL
  Filled 2019-11-09: qty 2

## 2019-11-09 MED ORDER — FUROSEMIDE 10 MG/ML IJ SOLN
20.0000 mg | Freq: Every day | INTRAMUSCULAR | Status: DC
  Administered 2019-11-10 – 2019-11-11 (×2): 20 mg via INTRAVENOUS
  Filled 2019-11-09 (×2): qty 4

## 2019-11-09 MED ORDER — AMLODIPINE BESYLATE 10 MG PO TABS
10.0000 mg | ORAL_TABLET | Freq: Every day | ORAL | Status: DC
  Administered 2019-11-09 – 2019-11-10 (×2): 10 mg via ORAL
  Filled 2019-11-09: qty 2
  Filled 2019-11-09: qty 1

## 2019-11-09 MED ORDER — SODIUM BICARBONATE 8.4 % IV SOLN
50.0000 meq | Freq: Once | INTRAVENOUS | Status: AC
  Administered 2019-11-09: 50 meq via INTRAVENOUS
  Filled 2019-11-09: qty 50

## 2019-11-09 MED ORDER — IPRATROPIUM-ALBUTEROL 20-100 MCG/ACT IN AERS
1.0000 | INHALATION_SPRAY | Freq: Four times a day (QID) | RESPIRATORY_TRACT | Status: DC
  Filled 2019-11-09: qty 4

## 2019-11-09 MED ORDER — BUPROPION HCL ER (SR) 150 MG PO TB12
150.0000 mg | ORAL_TABLET | Freq: Two times a day (BID) | ORAL | Status: DC
  Administered 2019-11-09 – 2019-11-11 (×4): 150 mg via ORAL
  Filled 2019-11-09 (×6): qty 1

## 2019-11-09 MED ORDER — ATROPINE SULFATE 1 MG/10ML IJ SOSY
0.5000 mg | PREFILLED_SYRINGE | Freq: Once | INTRAMUSCULAR | Status: AC
  Administered 2019-11-09: 0.5 mg via INTRAVENOUS
  Filled 2019-11-09: qty 10

## 2019-11-09 MED ORDER — ONDANSETRON HCL 4 MG/2ML IJ SOLN: 4.0000 mg | Freq: Four times a day (QID) | INTRAMUSCULAR | Status: DC | PRN

## 2019-11-09 MED ORDER — IPRATROPIUM-ALBUTEROL 0.5-2.5 (3) MG/3ML IN SOLN: 3.0000 mL | Freq: Four times a day (QID) | RESPIRATORY_TRACT | Status: DC | PRN

## 2019-11-09 MED ORDER — ACETAMINOPHEN 650 MG RE SUPP: 650.0000 mg | Freq: Four times a day (QID) | RECTAL | Status: DC | PRN

## 2019-11-09 MED ORDER — HYDRALAZINE HCL 25 MG PO TABS
25.0000 mg | ORAL_TABLET | Freq: Two times a day (BID) | ORAL | Status: DC
  Administered 2019-11-09 – 2019-11-11 (×4): 25 mg via ORAL
  Filled 2019-11-09 (×4): qty 1

## 2019-11-09 MED ORDER — TRAZODONE HCL 50 MG PO TABS
50.0000 mg | ORAL_TABLET | Freq: Every day | ORAL | Status: DC
  Administered 2019-11-09 – 2019-11-10 (×2): 50 mg via ORAL
  Filled 2019-11-09: qty 2
  Filled 2019-11-09: qty 1

## 2019-11-09 MED ORDER — PANTOPRAZOLE SODIUM 40 MG PO TBEC
40.0000 mg | DELAYED_RELEASE_TABLET | Freq: Every day | ORAL | Status: DC
  Administered 2019-11-09 – 2019-11-11 (×3): 40 mg via ORAL
  Filled 2019-11-09 (×3): qty 1

## 2019-11-09 MED ORDER — NITROGLYCERIN 0.4 MG SL SUBL: 0.4000 mg | SUBLINGUAL_TABLET | SUBLINGUAL | Status: DC | PRN

## 2019-11-09 MED ORDER — TAMSULOSIN HCL 0.4 MG PO CAPS
0.4000 mg | ORAL_CAPSULE | Freq: Every day | ORAL | Status: DC
  Administered 2019-11-09 – 2019-11-11 (×3): 0.4 mg via ORAL
  Filled 2019-11-09 (×3): qty 1

## 2019-11-09 MED ORDER — PRAVASTATIN SODIUM 20 MG PO TABS
20.0000 mg | ORAL_TABLET | Freq: Every day | ORAL | Status: DC
  Administered 2019-11-09 – 2019-11-10 (×2): 20 mg via ORAL
  Filled 2019-11-09 (×2): qty 1

## 2019-11-09 MED ORDER — DEXTROSE 50 % IV SOLN
1.0000 | Freq: Once | INTRAVENOUS | Status: AC
  Administered 2019-11-09: 50 mL via INTRAVENOUS
  Filled 2019-11-09: qty 50

## 2019-11-09 NOTE — ED Provider Notes (Addendum)
[facility] Emergency Department Provider Note   ____________________________________________   First MD Initiated Contact with Patient 11/09/19 1259     (approximate)  I have reviewed the triage vital signs and the nursing notes.   HISTORY  Chief Complaint Shortness of Breath and Weakness    HPI Tysheena Ginzburg is a 74 y.o. female patient reports shortness of breath and weakness for possibly 3 weeks getting worse.  She has been using her inhalers and nebulizer but it really has not helped any.  She came in today to be checked.  She is not running a fever or coughing.  She has not had any chest pain.  She did have a stent removed at [facility] about 3 weeks ago.  The stent was placed because she had a left-sided mass compressing the ureter and giving her left-sided hydronephrosis.         Past Medical History:  Diagnosis Date  . Anxiety   . Asthma   . COPD (chronic obstructive pulmonary disease) ([place])   . Depression   . GERD (gastroesophageal reflux disease)   . Hypertension     Patient Active Problem List   Diagnosis Date Noted  . Acute on chronic respiratory failure with hypoxia ([place]) 09/15/2019  . COPD exacerbation ([place]) 11/21/2017  . Acute bronchitis 03/26/2016  . Essential hypertension 03/26/2016  . Chest pain 03/25/2016    Past Surgical History:  Procedure Laterality Date  . ABDOMINAL HYSTERECTOMY    . TOTAL HIP ARTHROPLASTY Left   . TUBAL LIGATION      Prior to Admission medications   Medication Sig Start Date End Date Taking? Authorizing Provider  albuterol (PROVENTIL HFA;VENTOLIN HFA) 108 (90 Base) MCG/ACT inhaler Inhale 2 puffs into the lungs every 6 (six) hours as needed for wheezing or shortness of breath.    [provider]  albuterol (PROVENTIL) (2.5 MG/3ML) 0.083% nebulizer solution Take 2.5 mg by nebulization every 6 (six) hours as needed for wheezing or shortness of breath.    [provider]  alendronate  (FOSAMAX) 70 MG tablet Take 70 mg by mouth once a week. Take with a full glass of water on an empty stomach.    [provider]  amLODipine (NORVASC) 10 MG tablet Take 10 mg by mouth daily.    [provider]  apixaban (ELIQUIS) 5 MG TABS tablet Take 1 tablet (5 mg total) by mouth 2 (two) times daily. 09/29/19   Ezekiel Slocumb, DO  aspirin EC 81 MG EC tablet Take 1 tablet (81 mg total) by mouth daily. 03/27/16   Hillary Bow, MD  diphenhydrAMINE (BENADRYL) 25 mg capsule Take 25 mg by mouth every 6 (six) hours as needed for itching or allergies.    [provider]  feeding supplement, ENSURE ENLIVE, (ENSURE ENLIVE) LIQD Take 237 mLs by mouth daily. 09/27/19   Ezekiel Slocumb, DO  fluticasone-salmeterol (ADVAIR HFA) 230-21 MCG/ACT inhaler Inhale 2 puffs into the lungs 2 (two) times daily.    [provider]  gabapentin (NEURONTIN) 300 MG capsule Take 300 mg by mouth 2 (two) times daily.     [provider]  hydrALAZINE (APRESOLINE) 25 MG tablet Take 25 mg by mouth in the morning and at bedtime.    [provider]  Ipratropium-Albuterol (COMBIVENT) 20-100 MCG/ACT AERS respimat Inhale 1 puff into the lungs 4 (four) times daily.    [provider]  lidocaine (LIDODERM) 5 % Place 1 patch onto the skin daily. Remove &  Discard patch within 12 hours or as directed by MD 09/28/19   Nicole Kindred A, DO  metoprolol tartrate (LOPRESSOR) 25 MG tablet Take 1 tablet (25 mg total) by mouth 2 (two) times daily. 09/27/19   Ezekiel Slocumb, DO  nitroGLYCERIN (NITROSTAT) 0.4 MG SL tablet Place 1 tablet (0.4 mg total) under the tongue every 5 (five) minutes as needed for chest pain. 09/27/19   Ezekiel Slocumb, DO  omeprazole (PRILOSEC) 20 MG capsule Take 20 mg by mouth daily.    [provider]  ondansetron (ZOFRAN-ODT) 4 MG disintegrating tablet Take 4 mg by mouth every 8 (eight) hours as needed for nausea or vomiting. 09/12/19   [provider]  pravastatin (PRAVACHOL) 20 MG tablet Take 20 mg by mouth at bedtime.     [provider]  tamsulosin (FLOMAX) 0.4 MG CAPS capsule Take 0.4 mg by mouth daily.    [provider]  vitamin B-12 (CYANOCOBALAMIN) 1000 MCG tablet Take 1,000 mcg by mouth daily.    [provider]    Allergies Aspirin, Morphine and related, and Nsaids  Family History  Problem Relation Age of Onset  . Heart disease Father   . Heart disease Sister     Social History Social History   Tobacco Use  . Smoking status: Former Smoker    Packs/day: 0.75    Years: 40.00    Pack years: 30.00    Types: Cigarettes    Quit date: 11/05/2019    Years since quitting: 0.0  . Smokeless tobacco: Never Used  Vaping Use  . Vaping Use: Never used  Substance Use Topics  . Alcohol use: No  . Drug use: No    Review of Systems  Constitutional: No fever/chills Eyes: No visual changes. ENT: No sore throat. Cardiovascular: Denies chest pain. Respiratory:  shortness of breath. Gastrointestinal: No abdominal pain.  No nausea, no vomiting.  No diarrhea.  No constipation. Genitourinary: Negative for dysuria. Musculoskeletal: Negative for back pain. Skin: Negative for rash. Neurological: Negative for headaches, focal weakness____________________________________________   PHYSICAL EXAM:  VITAL SIGNS: ED Triage Vitals  Enc Vitals Group     BP 11/09/19 1128 (!) 136/48     Pulse Rate 11/09/19 1128 (!) 46     Resp 11/09/19 1128 (!) 26     Temp 11/09/19 1128 98.7 F (37.1 C)     Temp Source 11/09/19 1128 Oral     SpO2 11/09/19 1128 94 %     Weight 11/09/19 1130 150 lb (68 kg)     Height 11/09/19 1130 5\' 3"  (1.6 m)     Head Circumference --      Peak Flow --      Pain Score 11/09/19 1130 9     Pain Loc --      Pain Edu? --      Excl. in [place]? --    Constitutional: Alert and oriented. Well appearing and in no acute distress. Eyes: Conjunctivae are normal.  Head:  Atraumatic. Nose: No congestion/rhinnorhea. Mouth/Throat: Mucous membranes are moist.  Oropharynx non-erythematous. Neck: No stridor.   Cardiovascular: Normal rate, regular rhythm. Grossly normal heart sounds.  Good peripheral circulation. Respiratory: Normal respiratory effort.  No retractions. Lungs crackles halfway up bilaterally Gastrointestinal: Soft and nontender. No distention. No abdominal bruits.  Musculoskeletal: No lower extremity tenderness nor edema.  . Neurologic:  Normal speech and language. No gross focal neurologic deficits are appreciated.  Skin:  Skin is warm, dry and intact. No rash  noted.   ____________________________________________   LABS (all labs ordered are listed, but only abnormal results are displayed)  Labs Reviewed  BASIC METABOLIC PANEL - Abnormal; Notable for the following components:      Result Value   Sodium 121 (*)    Potassium 6.4 (*)    Chloride 90 (*)    Glucose, Bld 122 (*)    BUN 51 (*)    Creatinine, Ser 1.67 (*)    GFR calc non Af Amer 30 (*)    GFR calc Af Amer 34 (*)    All other components within normal limits  CBC - Abnormal; Notable for the following components:   RBC 2.95 (*)    Hemoglobin 9.1 (*)    HCT 27.2 (*)    Platelets 616 (*)    nRBC 0.3 (*)    All other components within normal limits  BRAIN NATRIURETIC PEPTIDE - Abnormal; Notable for the following components:   B Natriuretic Peptide 860.2 (*)    All other components within normal limits  TROPONIN I (HIGH SENSITIVITY) - Abnormal; Notable for the following components:   Troponin I (High Sensitivity) 18 (*)    All other components within normal limits  TROPONIN I (HIGH SENSITIVITY) - Abnormal; Notable for the following components:   Troponin I (High Sensitivity) 18 (*)    All other components within normal limits  URINALYSIS, COMPLETE (UACMP) WITH MICROSCOPIC  LACTIC ACID, PLASMA  LACTIC ACID, PLASMA  HEPATIC FUNCTION PANEL    ____________________________________________  EKG  EKG read interpreted by me shows third-degree AV block at a rate of 46 normal axis unusual configuration of the T waves in several leads hich may be due to interposed P. ____________________________________________  RADIOLOGY  ED MD interpretation: Chest x-ray read by radiology reviewed by me shows what appears to be CHF. CT read by radiology reviewed by me shows left hydronephrosis Official radiology report(s): DG Chest 2 View  Result Date: 11/09/2019 CLINICAL DATA:  Short of breath 3 days. COPD. Hypertension. Smoker. EXAM: CHEST - 2 VIEW COMPARISON:  09/24/2019 FINDINGS: Heart size upper normal.  Heavily calcified aortic arch. Increased interstitial lung markings diffusely specially right lung base. Possible interstitial edema. Small bilateral pleural effusions. Interval improvement in aeration lung bases where there previously was bibasilar atelectasis and effusion. IMPRESSION: Interval development of interstitial edema pattern likely due to fluid overload. Atypical infection also possible. Small bilateral effusions. Electronically Signed   By: Franchot Gallo M.D.   On: 11/09/2019 12:00    ____________________________________________   PROCEDURES  Procedure(s) performed (including Critical Care): Critical care time 55 minutes.  This includes reviewing the patient's records and discussing the patient with hospitalist, cardiology, urology (x2) and renal.  I spent about 20 minutes at the bedside at least while we gave the patient her calcium bicarb insulin glucose and atropine.  I am also paging cardiology and the hospitalist.  Procedures   ____________________________________________   INITIAL IMPRESSION / ASSESSMENT AND PLAN / ED COURSE  Patient with new onset congestive heart failure and junctional rhythm and acute kidney injury and hyponatremia and hyperkalemia with an elevated troponin.  This may be due to the mass causing  worsening of the kidney injury.  Last month she had a GFR greater than 60 with normal electrolytes.  I have given her calcium and bicarb and glucose and insulin in an effort to reverse the hypokalemia.  Possibly this may do something for the third-degree AV block.  She has a good blood pressure.  I will CT her abdomen to check on the mass in the hydronephrosis that was present.  I have spoken to urology let them know in case they need to put in a new stent.  I have also paged renal as it is likely that this lady may need dialysis.  I will try some Lasix to see if this helps with the potassium and the CHF.    ----------------------------------------- 2:17 PM on 11/09/2019 -----------------------------------------  Discussed patient with Dr. Rockey Situ.  He feels that the heart likely will self-correct if we can get the electrolytes back in line.  We will repeat the BNP here shortly and see if this is process is begun.  CT is back on to get the official report I will call urology back.  Hospitalist has been consulted at this point. Dr.Sninski will come to see the patient.  She apparently had the nephrostomy tube pulled 3 weeks ago because of pain.  She might not want another one although that is what she needs.  If not she may benefit from a nephrostomy tube.  Dr. Caprice Beaver will discuss this with her.         ____________________________________________   FINAL CLINICAL IMPRESSION(S) / ED DIAGNOSES  Final diagnoses:  Dyspnea, unspecified type  Hypoxia  Hyponatremia  Hyperkalemia  Acute congestive heart failure, unspecified heart failure type ([place])  AKI (acute kidney injury) ([place])  Junctional bradycardia  Elevated troponin     ED Discharge Orders    None       Note:  This document was prepared using Dragon voice recognition software and may include unintentional dictation errors.    Nena Polio, MD 11/09/19 1418    Nena Polio, MD 11/09/19 [phone]

## 2019-11-09 NOTE — Progress Notes (Addendum)
Urology Consult   I have been asked to see the patient by Dr. Rip Harbour, for evaluation and management of left hydronephrosis and AKI.  Chief Complaint: Weakness  HPI:  Navayah Sok is a 74 y.o. year old extremely comorbid and frail 74 year old female with medical history notable for COPD and recent hospitalization for aspiration pneumonia, intubation, and cardiac arrest requiring CPR.  She presents with worsening weakness and shortness of breath.  She has a complex urologic history, and it has been managed at [facility] by Dr. Kandee Keen.  The patient is a very poor historian, and her urologic history is obtained from [place].  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  She was having severe flank pain from the ureteral stent and since the left ureter appeared relatively patent at her last surgery, her stent was removed in clinic by Dr. Kandee Keen on 10/04/2019.  A follow-up renal ultrasound was performed on 10/16/2019 and showed recurrence of her severe left hydroureteronephrosis after removal of the stent and creatinine was stable at 1.0 on 10/16/2019, 0.9 on 10/27/2019, and 0.8 on 11/06/2019, all with severe left hydronephrosis at that time.  A NM renal scan was performed on 11/02/2019 and by report showed split function of 40% on the left and 60% on the right with left hydronephrosis without evidence of significant obstruction.  That study is unavailable for me to personally review.  She denies any urinary symptoms today.  She has had intermittent chronic left-sided abdominal and back pain over the last few months.  She does not want replacement of the left ureteral stent secondary to her severe pain with the stent  previously.  Urinalysis is benign today with 0-5 WBCs, no bacteria, nitrite negative, no leukocytes.  Lactic acid normal at 1.1, WBC 7.6, creatinine 1.67(EGFR 34), and hyperkalemia with potassium of 6.4.  CT stone protocol in the ER today showed severe left hydronephrosis unchanged from the ultrasound on 10/16/2019 with no ureteral stones noted.  Chest x-ray consistent with fluid overload versus atypical infection.  PMH: Past Medical History:  Diagnosis Date  . Anxiety   . Asthma   . COPD (chronic obstructive pulmonary disease) ([place])   . Depression   . GERD (gastroesophageal reflux disease)   . Hypertension     Surgical History: Past Surgical History:  Procedure Laterality Date  . ABDOMINAL HYSTERECTOMY    . TOTAL HIP ARTHROPLASTY Left   . TUBAL LIGATION      Allergies:  Allergies  Allergen Reactions  . Morphine And Related Other (See Comments)    Delerium, itching  . Nsaids Other (See Comments)    GI ulcers  . Aspirin Other (See Comments)    GI ulcers Can tolerate 81 mg daily    Family History: Family History  Problem Relation Age of Onset  . Heart disease Father   . Heart disease Sister     Social History:  reports that she quit smoking 4 days ago. Her smoking use included cigarettes. She has a 30.00 pack-year smoking history. She has never used smokeless tobacco. She reports that she does not drink alcohol and does not use drugs.  ROS: Negative aside from those stated in the HPI.  Physical Exam: BP (!) 148/61  Pulse (!) 51   Temp 98.7 F (37.1 C) (Oral)   Resp (!) 25   Ht 5' 3"  (1.6 m)   Wt 74.8 kg   SpO2 99%   BMI 29.19 kg/m    Constitutional: On oxygen, frail-appearing GI: Abdomen is soft, nontender, nondistended, no abdominal masses GU: No CVA tenderness  Laboratory Data: Reviewed, see HPI  Pertinent Imaging: I have personally reviewed the CT stone protocol which shows severe left hydronephrosis which is reportedly stable from her renal  ultrasound that was performed at [facility] on 8/2.  There were no ureteral stones.  Suspect obstruction is secondary to her left distal ureteral tumor.  Assessment & Plan:   In summary she is a very frail-appearing and comorbid 74 year old female with a complex urologic history who has been followed at [facility].  She has a left distal ureteral tumor with resulting upstream hydronephrosis and her left ureteral stent was removed on 10/04/2019 at [facility].  A follow-up renal ultrasound 1 week later on 8/2 showed recurrence of her left severe hydronephrosis and renal function has remained stable with a creatinine of 1.0 over the last 3 weeks.  She is admitted today with weakness and shortness of breath, AKI with creatinine of 1.67, and hyperkalemia.  She has no clinical or laboratory evidence of infection.  Recommendations:  -Though renal function is worse today, she has had severe left hydronephrosis since at least 10/16/2019 on renal ultrasound with creatinine of ~1.0 over the last few weeks.  She did not tolerate a left ureteral stent well previously secondary to severe pain and this had to be removed in clinic at [facility] on 10/04/2019.  I would recommend trending her renal function, and if it does not improve could consider nephrostomy tube placement by interventional radiology. Would hold eliquis in case need for nephrostomy tube placement.  -Urology will continue to follow  Billey Co, MD  Total time spent on the floor was 80 minutes, with greater than 50% spent in counseling and coordination of care with the patient regarding left hydronephrosis secondary to left urothelial cell carcinoma and AKI.  [place] [address], [place], [place] [address] [phone]

## 2019-11-09 NOTE — ED Notes (Signed)
Pt sat at 89% on 2 L [place]. MD Malinda increased oxygen to 5L [place] resulting in 98% at this time.

## 2019-11-09 NOTE — ED Notes (Signed)
MD Malinda at bedside while this RN admx meds.  Pt placed on cardiac monitor and code cart at beside with place on pt.

## 2019-11-09 NOTE — ED Notes (Signed)
This Rn spoke with son Venora Maples) over the phone. Son st pt "does not take her medication like she is supposed to"; she has [employer] that starts on 11/15/2019. Son is requesting a SW consult for pt due to son not able to monitor pt and ensure patient takes her medication according to schedule.  Son st "I have to be at work and I want my mother to be safe at home" ' sometimes she lies to me and tells me that she took all of her medications or that she ate and she did not". 'I need help".  Son st, pt "only drinks ensure and will not eat and then takes her medication". Pt's son is asking for home health care help, "I need someone that can give my mother her medication and make sure she takes her medication like she is supposed to".    This RN explained pt's son that this RN will inform MD and night shift RN regarding his concerns and SW consult.

## 2019-11-09 NOTE — ED Notes (Signed)
Pt provided with dinner tray.

## 2019-11-09 NOTE — H&P (Addendum)
[facility] NAME: Pamela James    MR#:  [id number]  DATE OF BIRTH:  1945-09-30  DATE OF ADMISSION:  11/09/2019  PRIMARY CARE PHYSICIAN: Gladstone Lighter, MD   REQUESTING/REFERRING PHYSICIAN: Dr. Rip Harbour  Patient coming from : home lives with son and daughter-in-law   CHIEF COMPLAINT:  increasing shortness of breath for last couple days  HISTORY OF PRESENT ILLNESS:  Pamela James  is a 74 y.o. female with a known history of COPD/asthma (currently not on home oxygen, Gerd,MGUS, left hydronephrosis status post left ureteroscopy on June 29 with stent placement by Dr. Bernardo Heater. Biopsy did not show any uro-epithelial carcinoma (per urology notes), Anxiety, depression, bipolar disorder comes to the emergency room with increasing shortness of breath for last few days. She was found to be hypoxic sats down in the 85 percent. Patient was placed on 2 L nasal cannula oxygen.  ED course: temperature 98 ,pulse 42-51, sats 87 to 92% on 2 L, BNP 860 sodium 121, potassium 6.4, creatinine 1.67 (baseline .83)  chest x-ray showedInterval development of interstitial edema pattern likely due to fluid overload. Atypical infection also possible. Small bilateral Effusions.  CT renal stone study showed Left renal stent seen on the prior CT has been removed. The patient has new moderately severe left hydroureteronephrosis. Increased attenuation within the mid and distal left ureter is likely due to hemorrhage but tumor within the ureter is also possible given the patient's history. Small bilateral pleural effusions. Bibasilar airspace disease seen on the prior CT is improved. Right renal scarring with 3 nonobstructing stones, unchanged.   Patient is being admitted for acute hypoxic respiratory failure/  diastolic congestive diastolic congestive heart failure,/hyperkalemia/acute renal failure with obstructive neuropathy and severe left Hydronephrosis.  In  the ER patient received IV Lasix 20 mg times one, IV calcium gluconate, bicarb, dextrose with insulin,veltassa. She had intermittent bradycardia. One dose of atropine was given. During my evaluation heart rate was in the 50s.  PAST MEDICAL HISTORY:   Past Medical History:  Diagnosis Date  . Anxiety   . Asthma   . COPD (chronic obstructive pulmonary disease) ([place])   . Depression   . GERD (gastroesophageal reflux disease)   . Hypertension     PAST SURGICAL HISTOIRY:   Past Surgical History:  Procedure Laterality Date  . ABDOMINAL HYSTERECTOMY    . TOTAL HIP ARTHROPLASTY Left   . TUBAL LIGATION      SOCIAL HISTORY:   Social History   Tobacco Use  . Smoking status: Former Smoker    Packs/day: 0.75    Years: 40.00    Pack years: 30.00    Types: Cigarettes    Quit date: 11/05/2019    Years since quitting: 0.0  . Smokeless tobacco: Never Used  Substance Use Topics  . Alcohol use: No    FAMILY HISTORY:   Family History  Problem Relation Age of Onset  . Heart disease Father   . Heart disease Sister     DRUG ALLERGIES:   Allergies  Allergen Reactions  . Morphine And Related Other (See Comments)    Delerium, itching  . Nsaids Other (See Comments)    GI ulcers  . Aspirin Other (See Comments)    GI ulcers Can tolerate 81 mg daily    REVIEW OF SYSTEMS:  Review of Systems  Constitutional: Negative for chills, fever and weight loss.  HENT: Negative for ear discharge, ear pain and nosebleeds.   Eyes: Negative  for blurred vision, pain and discharge.  Respiratory: Positive for shortness of breath. Negative for sputum production, wheezing and stridor.   Cardiovascular: Positive for orthopnea and PND. Negative for chest pain and palpitations.  Gastrointestinal: Negative for abdominal pain, diarrhea, nausea and vomiting.  Genitourinary: Negative for frequency and urgency.  Musculoskeletal: Negative for back pain and joint pain.  Neurological: Positive for weakness.  Negative for sensory change, speech change and focal weakness.  Psychiatric/Behavioral: Negative for depression and hallucinations. The patient is not nervous/anxious.      MEDICATIONS AT HOME:   Prior to Admission medications   Medication Sig Start Date End Date Taking? Authorizing Provider  alendronate (FOSAMAX) 70 MG tablet Take 70 mg by mouth once a week. Take with a full glass of water on an empty stomach.   Yes [provider]  amLODipine (NORVASC) 10 MG tablet Take 10 mg by mouth daily.   Yes [provider]  apixaban (ELIQUIS) 5 MG TABS tablet Take 1 tablet (5 mg total) by mouth 2 (two) times daily. 09/29/19  Yes Ezekiel Slocumb, DO  aspirin EC 81 MG EC tablet Take 1 tablet (81 mg total) by mouth daily. 03/27/16  Yes Hillary Bow, MD  buPROPion (WELLBUTRIN SR) 150 MG 12 hr tablet Take 150 mg by mouth 2 (two) times daily. 11/06/19  Yes [provider]  fluticasone-salmeterol (ADVAIR HFA) 230-21 MCG/ACT inhaler Inhale 2 puffs into the lungs 2 (two) times daily.   Yes [provider]  gabapentin (NEURONTIN) 300 MG capsule Take 300 mg by mouth 2 (two) times daily.    Yes [provider]  hydrALAZINE (APRESOLINE) 25 MG tablet Take 25 mg by mouth in the morning and at bedtime.   Yes [provider]  Ipratropium-Albuterol (COMBIVENT) 20-100 MCG/ACT AERS respimat Inhale 1 puff into the lungs 4 (four) times daily.   Yes [provider]  lisinopril (ZESTRIL) 40 MG tablet Take 40 mg by mouth daily. 11/03/19  Yes [provider]  metoprolol tartrate (LOPRESSOR) 25 MG tablet Take 1 tablet (25 mg total) by mouth 2 (two) times daily. 09/27/19  Yes Nicole Kindred A, DO  omeprazole (PRILOSEC) 20 MG capsule Take 20 mg by mouth daily.   Yes [provider]  pravastatin (PRAVACHOL) 20 MG tablet Take 20 mg by mouth at bedtime.    Yes [provider]  tamsulosin (FLOMAX) 0.4 MG CAPS capsule Take 0.4 mg by mouth daily.    Yes [provider]  traZODone (DESYREL) 100 MG tablet Take 50-100 mg by mouth at bedtime. 11/06/19  Yes [provider]  vitamin B-12 (CYANOCOBALAMIN) 1000 MCG tablet Take 1,000 mcg by mouth daily.   Yes [provider]  albuterol (PROVENTIL HFA;VENTOLIN HFA) 108 (90 Base) MCG/ACT inhaler Inhale 2 puffs into the lungs every 6 (six) hours as needed for wheezing or shortness of breath.    [provider]  albuterol (PROVENTIL) (2.5 MG/3ML) 0.083% nebulizer solution Take 2.5 mg by nebulization every 6 (six) hours as needed for wheezing or shortness of breath.    [provider]  diphenhydrAMINE (BENADRYL) 25 mg capsule Take 25 mg by mouth every 6 (six) hours as needed for itching or allergies.    [provider]  feeding supplement, ENSURE ENLIVE, (ENSURE ENLIVE) LIQD Take 237 mLs by mouth daily. 09/27/19   Nicole Kindred A, DO  lidocaine (LIDODERM) 5 % Place 1 patch onto the skin daily. Remove & Discard patch within 12 hours or as directed by  MD 09/28/19   Ezekiel Slocumb, DO  nitroGLYCERIN (NITROSTAT) 0.4 MG SL tablet Place 1 tablet (0.4 mg total) under the tongue every 5 (five) minutes as needed for chest pain. 09/27/19   Ezekiel Slocumb, DO  ondansetron (ZOFRAN-ODT) 4 MG disintegrating tablet Take 4 mg by mouth every 8 (eight) hours as needed for nausea or vomiting. 09/12/19   [provider]      VITAL SIGNS:  Blood pressure (!) 148/61, pulse (!) 51, temperature 98.7 F (37.1 C), temperature source Oral, resp. rate (!) 25, height 5\' 3"  (1.6 m), weight 74.8 kg, SpO2 99 %.  PHYSICAL EXAMINATION:  GENERAL:  74 y.o.-year-old patient lying in the bed with no acute distress. obese EYES: Pupils equal, round, reactive to light and accommodation. No scleral icterus.  HEENT: Head atraumatic, normocephalic. Oropharynx and nasopharynx clear.  NECK:  Supple, no jugular venous distention. No thyroid enlargement, no tenderness.  LUNGS:  decreased breath sounds bilaterally, no wheezing, few rales, norhonchi or crepitation. No use of accessory muscles of respiration.  CARDIOVASCULAR: S1, S2 normal. No murmurs, rubs, or gallops.  ABDOMEN: Soft, nontender, nondistended. Bowel sounds present. No organomegaly or mass.  EXTREMITIES: + pedal edema, no cyanosis, or clubbing.  NEUROLOGIC: Cranial nerves II through XII are intact. Muscle strength 5/5 in all extremities. Sensation intact. Gait not checked.  PSYCHIATRIC: The patient is alert and oriented x 3.  SKIN: No obvious rash, lesion, or ulcer.   LABORATORY PANEL:   CBC Recent Labs  Lab 11/09/19 1129  WBC 7.6  HGB 9.1*  HCT 27.2*  PLT 616*   ------------------------------------------------------------------------------------------------------------------  Chemistries  Recent Labs  Lab 11/09/19 1129  NA 121*  K 6.4*  CL 90*  CO2 23  GLUCOSE 122*  BUN 51*  CREATININE 1.67*  CALCIUM 8.9  AST 26  ALT 31  ALKPHOS 87  BILITOT 0.9   ------------------------------------------------------------------------------------------------------------------  Cardiac Enzymes No results for input(s): TROPONINI in the last 168 hours. ------------------------------------------------------------------------------------------------------------------  RADIOLOGY:  DG Chest 2 View  Result Date: 11/09/2019 CLINICAL DATA:  Short of breath 3 days. COPD. Hypertension. Smoker. EXAM: CHEST - 2 VIEW COMPARISON:  09/24/2019 FINDINGS: Heart size upper normal.  Heavily calcified aortic arch. Increased interstitial lung markings diffusely specially right lung base. Possible interstitial edema. Small bilateral pleural effusions. Interval improvement in aeration lung bases where there previously was bibasilar atelectasis and effusion. IMPRESSION: Interval development of interstitial edema pattern likely due to fluid overload. Atypical infection also possible. Small bilateral effusions.  Electronically Signed   By: Franchot Gallo M.D.   On: 11/09/2019 12:00   CT Renal Stone Study  Result Date: 11/09/2019 CLINICAL DATA:  Weakness and shortness of breath for 3 weeks. 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 EXAM: CT ABDOMEN AND PELVIS WITHOUT CONTRAST TECHNIQUE: Multidetector CT imaging of the abdomen and pelvis was performed following the standard protocol without IV contrast. COMPARISON:  CT abdomen and pelvis 09/21/2019. FINDINGS: Lower chest: The patient has small bilateral pleural effusions. Bibasilar airspace disease present on the prior study has markedly improved. Heart size is normal. Hepatobiliary: No focal liver abnormality is seen. No gallstones, gallbladder wall thickening, or biliary dilatation. Pancreas: Unremarkable. No pancreatic ductal dilatation or surrounding inflammatory changes. Spleen: Normal in size without focal abnormality. Adrenals/Urinary Tract: The left adrenal gland is enlarged at 2.3 x 1.7 cm. Hounsfield unit measurements are 23.6. The prior CT scan was contrast infused enhanced and the left adrenal had Hounsfield unit measurements of 147.5. Findings are consistent  with a benign left adrenal adenoma. Left ureteral stent present on the prior CT has been removed. There is moderately severe left hydronephrosis. The left ureter is dilated and has material within it measuring 39 Hounsfield units in the mid ureter. Similar attenuating material is seen in the left ureter at the UVJ. Evaluation of the distal ureters somewhat limited due to streak artifact from the patient's left hip replacement. Right renal scarring is again seen. 2-3 nonobstructing stones in the right kidney are seen. The largest measures 0.5 cm. Stomach/Bowel: Stomach is within normal limits. Small hiatal hernia is noted. The appendix is not seen but no evidence of appendicitis is present. No evidence of bowel wall thickening, distention, or inflammatory changes. Vascular/Lymphatic:  Aortic atherosclerosis. No enlarged abdominal or pelvic lymph nodes. Reproductive: Status post hysterectomy. No adnexal masses. Other: There is a small volume of free pelvic fluid. Small fat containing umbilical hernia noted. Musculoskeletal: No acute or focal abnormality. Left hip arthroplasty noted. IMPRESSION: Left renal stent seen on the prior CT has been removed. The patient has new moderately severe left hydroureteronephrosis. Increased attenuation within the mid and distal left ureter is likely due to hemorrhage but tumor within the ureter is also possible given the patient's history. Small bilateral pleural effusions. Bibasilar airspace disease seen on the prior CT is improved. Right renal scarring with 3 nonobstructing stones, unchanged. Small volume of pelvic ascites. Aortic Atherosclerosis (ICD10-I70.0). Electronically Signed   By: Inge Rise M.D.   On: 11/09/2019 14:23    EKG:    IMPRESSION AND PLAN:   Gentry Seeber  is a 74 y.o. female with a known history of COPD/asthma (currently not on home oxygen, Gerd,MGUS, left hydronephrosis status post left ureteroscopy on June 29 with by Dr. Bernardo Heater. , Anxiety, depression, bipolar disorder comes to the emergency room with increasing shortness of breath for last few days. She was found to be hypoxic sats down in the 85 percent. Patient was placed on 2 L nasal cannula oxygen.  1.Acute hypoxic respiratory failure suspected due to acute on chronic diastolic congestive heart failure /bradycardia -admit to MedSurg floor -telemonitoring -IV Lasix 20 mg daily-- change to PRN if patient improves -wean oxygen down if able to -cardiology consultation with Dr. Rockey Situ -heart rate improved. Hold beta-blockers -PRN atropine if needed -echo done in July 2021. Will not repeat. EF 60 to 65%  2. 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 Patient stent was removed on July 21 since she was having significant pain. -CT stone protocol in ER  Today showed severe hydronephrosis unchanged from ultrasound on August 2 with no ureteral stones. -Urology consultation with Dr Diamantina Providence-- recommends correct electrolyte and follow metabolic panel. If no improvement then consider IR to place nephrostomy tube.  3. Hyperkalemia/Hyponatremia with acute renal failure/obstructive uropathy -received treatment in the ER in the form of IV calcium gluconate, bicarb, dextrose with insulin,veltassa -stat potassium ordered-- results pending -nephrology consultation with Dr. Zollie Scale aware  4. History of left subclavian DVT -patient on eliquis-- holding since may require urology procedure  5. History of anxiety, bipolar disorder, depression -continue home meds -patient has psych consult in July 2021  6. Anemia of chronic disease -hemoglobin 9.1 stable    Family Communication :none Consults : cardiology, urology, nephrology Code Status : full DVT prophylaxis : SCD eliquis on hold in case patient needs urology procedure.  TOTAL TIME TAKING  CARE OF THIS PATIENT: *55* minutes.    Fritzi Mandes M.D  [facility]     CC: Primary care physician; Gladstone Lighter, MD

## 2019-11-09 NOTE — ED Triage Notes (Signed)
Patient c/o weakness and sob X3 weeks. Reports she could not dress herself to get here she is so weak. Baseline can ambulate with cane and able to do daily activities by herself. A&O x4. Labored breathing in triage. HX COPD

## 2019-11-09 NOTE — ED Notes (Addendum)
THis RN entered room and found pt hypoxic at 87% on RA with auditory wheezing.  Pt placed on 2L [place] resulting in 94%.  Pt st she does not use oxygen at home.

## 2019-11-10 ENCOUNTER — Inpatient Hospital Stay (HOSPITAL_COMMUNITY)
Admit: 2019-11-10 | Discharge: 2019-11-10 | Disposition: A | Discharge status: Home / Self Care | Payer: Medicare HMO | Attending: Physician Assistant | Admitting: Physician Assistant

## 2019-11-10 DIAGNOSIS — D649 Anemia, unspecified: Secondary | ICD-10-CM

## 2019-11-10 DIAGNOSIS — J811 Chronic pulmonary edema: Secondary | ICD-10-CM

## 2019-11-10 DIAGNOSIS — N1330 Unspecified hydronephrosis: Secondary | ICD-10-CM

## 2019-11-10 DIAGNOSIS — F17200 Nicotine dependence, unspecified, uncomplicated: Secondary | ICD-10-CM

## 2019-11-10 DIAGNOSIS — R531 Weakness: Secondary | ICD-10-CM

## 2019-11-10 DIAGNOSIS — R0600 Dyspnea, unspecified: Secondary | ICD-10-CM

## 2019-11-10 DIAGNOSIS — I5033 Acute on chronic diastolic (congestive) heart failure: Secondary | ICD-10-CM

## 2019-11-10 DIAGNOSIS — C662 Malignant neoplasm of left ureter: Secondary | ICD-10-CM

## 2019-11-10 DIAGNOSIS — N132 Hydronephrosis with renal and ureteral calculous obstruction: Secondary | ICD-10-CM

## 2019-11-10 DIAGNOSIS — N139 Obstructive and reflux uropathy, unspecified: Secondary | ICD-10-CM

## 2019-11-10 DIAGNOSIS — R638 Other symptoms and signs concerning food and fluid intake: Secondary | ICD-10-CM

## 2019-11-10 DIAGNOSIS — J9601 Acute respiratory failure with hypoxia: Secondary | ICD-10-CM

## 2019-11-10 DIAGNOSIS — R9431 Abnormal electrocardiogram [ECG] [EKG]: Secondary | ICD-10-CM

## 2019-11-10 DIAGNOSIS — D4122 Neoplasm of uncertain behavior of left ureter: Secondary | ICD-10-CM

## 2019-11-10 DIAGNOSIS — J449 Chronic obstructive pulmonary disease, unspecified: Secondary | ICD-10-CM

## 2019-11-10 DIAGNOSIS — N189 Chronic kidney disease, unspecified: Secondary | ICD-10-CM

## 2019-11-10 DIAGNOSIS — R5381 Other malaise: Secondary | ICD-10-CM

## 2019-11-10 LAB — ECHOCARDIOGRAM LIMITED
Height: 63 in
S' Lateral: 2.19 cm
Weight: 2636.8 oz

## 2019-11-10 LAB — BASIC METABOLIC PANEL
Anion gap: 9 (ref 5–15)
BUN: 42 mg/dL — ABNORMAL HIGH (ref 8–23)
CO2: 27 mmol/L (ref 22–32)
Calcium: 10 mg/dL (ref 8.9–10.3)
Chloride: 97 mmol/L — ABNORMAL LOW (ref 98–111)
Creatinine, Ser: 1.32 mg/dL — ABNORMAL HIGH (ref 0.44–1.00)
GFR calc Af Amer: 46 mL/min — ABNORMAL LOW (ref >60)
GFR calc non Af Amer: 39 mL/min — ABNORMAL LOW (ref >60)
Glucose, Bld: 98 mg/dL (ref 70–99)
Potassium: 5.2 mmol/L — ABNORMAL HIGH (ref 3.5–5.1)
Sodium: 133 mmol/L — ABNORMAL LOW (ref 135–145)

## 2019-11-10 MED ORDER — ENSURE ENLIVE PO LIQD
237.0000 mL | Freq: Two times a day (BID) | ORAL | Status: DC
  Administered 2019-11-10 – 2019-11-11 (×3): 237 mL via ORAL

## 2019-11-10 MED ORDER — HEPARIN BOLUS VIA INFUSION
4000.0000 [IU] | Freq: Once | INTRAVENOUS | Status: AC
  Administered 2019-11-10: 4000 [IU] via INTRAVENOUS
  Filled 2019-11-10: qty 4000

## 2019-11-10 MED ORDER — GABAPENTIN 100 MG PO CAPS
100.0000 mg | ORAL_CAPSULE | Freq: Two times a day (BID) | ORAL | Status: DC
  Administered 2019-11-10 – 2019-11-11 (×3): 100 mg via ORAL
  Filled 2019-11-10 (×3): qty 1

## 2019-11-10 MED ORDER — UMECLIDINIUM-VILANTEROL 62.5-25 MCG/INH IN AEPB
1.0000 | INHALATION_SPRAY | Freq: Every day | RESPIRATORY_TRACT | Status: DC
  Administered 2019-11-10 – 2019-11-11 (×2): 1 via RESPIRATORY_TRACT
  Filled 2019-11-10: qty 14

## 2019-11-10 MED ORDER — CHLORHEXIDINE GLUCONATE CLOTH 2 % EX PADS
6.0000 | MEDICATED_PAD | Freq: Every day | CUTANEOUS | Status: DC
  Administered 2019-11-10 – 2019-11-11 (×2): 6 via TOPICAL

## 2019-11-10 MED ORDER — HEPARIN (PORCINE) 25000 UT/250ML-% IV SOLN
1150.0000 [IU]/h | INTRAVENOUS | Status: DC
  Administered 2019-11-10: 1150 [IU]/h via INTRAVENOUS
  Filled 2019-11-10: qty 250

## 2019-11-10 NOTE — Consult Note (Signed)
[facility] CONSULT NOTE    Date: 11/10/2019                  Patient Name:  Pamela James  MRN: [id number]  DOB: 07-Apr-1945  Age / Sex: 74 y.o., female         PCP: Gladstone Lighter, MD                 Service Requesting Consult:  Hospitalist                 Reason for Consult:  Acute kidney injury, hyperkalemia            History of Present Illness: Patient is a 74 y.o. female with a PMHx of anxiety, asthma, COPD, depression, GERD, hypertension,, history of severe left hydronephrosis with distal left ureteral tumor with pathology demonstrating low-grade papillary urothelial cell carcinoma status post ureteral stent placement who was admitted to [facility] on 11/09/2019 for evaluation of weakness.  Patient is a poor historian and unable to offer significant history regarding her current illness.  Patient was having severe left flank pain from her prior placed left ureteral stent.  Stent was subsequently removed on 10/04/2019.  Repeat renal ultrasound after stent removal showed redevelopment of severe left hydroureteronephrosis.  Upon arrival here patient was found to have acute kidney injury with a BUN of 51, creatinine 1.67, and potassium 6.4.  In addition she had hyponatremia with serum sodium of 121.   Medications: Outpatient medications: Medications Prior to Admission  Medication Sig Dispense Refill Last Dose  . alendronate (FOSAMAX) 70 MG tablet Take 70 mg by mouth once a week. Take with a full glass of water on an empty stomach.   Past Week at Unknown time  . amLODipine (NORVASC) 10 MG tablet Take 10 mg by mouth daily.   11/09/2019 at 0900  . apixaban (ELIQUIS) 5 MG TABS tablet Take 1 tablet (5 mg total) by mouth 2 (two) times daily. 60 tablet 1 11/09/2019 at 0900  . aspirin EC 81 MG EC tablet Take 1 tablet (81 mg total) by mouth daily.   11/09/2019 at 0900  . buPROPion (WELLBUTRIN SR) 150 MG 12 hr tablet Take 150 mg by mouth 2 (two) times daily.   11/09/2019 at 0900  .  fluticasone-salmeterol (ADVAIR HFA) 230-21 MCG/ACT inhaler Inhale 2 puffs into the lungs 2 (two) times daily.   11/09/2019 at 0930  . gabapentin (NEURONTIN) 300 MG capsule Take 300 mg by mouth 2 (two) times daily.    11/09/2019 at 0900  . hydrALAZINE (APRESOLINE) 25 MG tablet Take 25 mg by mouth in the morning and at bedtime.   11/09/2019 at 0900  . Ipratropium-Albuterol (COMBIVENT) 20-100 MCG/ACT AERS respimat Inhale 1 puff into the lungs 4 (four) times daily.   11/09/2019 at 0930  . lisinopril (ZESTRIL) 40 MG tablet Take 40 mg by mouth daily.   11/09/2019 at 0900  . metoprolol tartrate (LOPRESSOR) 25 MG tablet Take 1 tablet (25 mg total) by mouth 2 (two) times daily. 60 tablet 1 11/09/2019 at 0900  . omeprazole (PRILOSEC) 20 MG capsule Take 20 mg by mouth daily.   11/09/2019 at 0900  . pravastatin (PRAVACHOL) 20 MG tablet Take 20 mg by mouth at bedtime.    11/08/2019 at 2000  . tamsulosin (FLOMAX) 0.4 MG CAPS capsule Take 0.4 mg by mouth daily.   11/09/2019 at 0900  . traZODone (DESYREL) 100 MG tablet Take 50-100 mg by mouth at bedtime.  11/08/2019 at 2000  . vitamin B-12 (CYANOCOBALAMIN) 1000 MCG tablet Take 1,000 mcg by mouth daily.   11/09/2019 at 0900  . albuterol (PROVENTIL HFA;VENTOLIN HFA) 108 (90 Base) MCG/ACT inhaler Inhale 2 puffs into the lungs every 6 (six) hours as needed for wheezing or shortness of breath.   prn at prn  . albuterol (PROVENTIL) (2.5 MG/3ML) 0.083% nebulizer solution Take 2.5 mg by nebulization every 6 (six) hours as needed for wheezing or shortness of breath.   prn at prn  . diphenhydrAMINE (BENADRYL) 25 mg capsule Take 25 mg by mouth every 6 (six) hours as needed for itching or allergies.   prn at prn  . feeding supplement, ENSURE ENLIVE, (ENSURE ENLIVE) LIQD Take 237 mLs by mouth daily. 237 mL 1   . lidocaine (LIDODERM) 5 % Place 1 patch onto the skin daily. Remove & Discard patch within 12 hours or as directed by MD 30 patch 0 prn at prn  . nitroGLYCERIN (NITROSTAT) 0.4 MG  SL tablet Place 1 tablet (0.4 mg total) under the tongue every 5 (five) minutes as needed for chest pain. 30 tablet 0 prn at prn  . ondansetron (ZOFRAN-ODT) 4 MG disintegrating tablet Take 4 mg by mouth every 8 (eight) hours as needed for nausea or vomiting.   prn at prn    Current medications: Current Facility-Administered Medications  Medication Dose Route Frequency Provider Last Rate Last Admin  . acetaminophen (TYLENOL) tablet 650 mg  650 mg Oral Q6H PRN Fritzi Mandes, MD       Or  . acetaminophen (TYLENOL) suppository 650 mg  650 mg Rectal Q6H PRN Fritzi Mandes, MD      . albuterol (PROVENTIL) (2.5 MG/3ML) 0.083% nebulizer solution 2.5 mg  2.5 mg Nebulization Q2H PRN Fritzi Mandes, MD   2.5 mg at 11/10/19 0009  . amLODipine (NORVASC) tablet 10 mg  10 mg Oral Daily Fritzi Mandes, MD   10 mg at 11/10/19 0848  . buPROPion (WELLBUTRIN SR) 12 hr tablet 150 mg  150 mg Oral BID Fritzi Mandes, MD   150 mg at 11/10/19 1250  . Chlorhexidine Gluconate Cloth 2 % PADS 6 each  6 each Topical Daily Fritzi Mandes, MD   6 each at 11/10/19 1250  . docusate sodium (COLACE) capsule 100 mg  100 mg Oral BID Fritzi Mandes, MD   100 mg at 11/10/19 0848  . feeding supplement (ENSURE ENLIVE) (ENSURE ENLIVE) liquid 237 mL  237 mL Oral BID BM Gonfa, Taye T, MD      . furosemide (LASIX) injection 20 mg  20 mg Intravenous Daily Fritzi Mandes, MD   20 mg at 11/10/19 0849  . gabapentin (NEURONTIN) capsule 100 mg  100 mg Oral BID Fritzi Mandes, MD   100 mg at 11/10/19 0848  . heparin ADULT infusion 100 units/mL (25000 units/263mL sodium chloride 0.45%)  1,150 Units/hr Intravenous Continuous Dallie Piles, RPH      . heparin bolus via infusion 4,000 Units  4,000 Units Intravenous Once Dallie Piles, RPH      . hydrALAZINE (APRESOLINE) tablet 25 mg  25 mg Oral BID Fritzi Mandes, MD   25 mg at 11/10/19 0848  . ipratropium-albuterol (DUONEB) 0.5-2.5 (3) MG/3ML nebulizer solution 3 mL  3 mL Inhalation Q6H PRN Fritzi Mandes, MD      .  nitroGLYCERIN (NITROSTAT) SL tablet 0.4 mg  0.4 mg Sublingual Q5 min PRN Fritzi Mandes, MD      . ondansetron [facility]) tablet 4 mg  4 mg Oral Q6H PRN Fritzi Mandes, MD       Or  . ondansetron [facility]) injection 4 mg  4 mg Intravenous Q6H PRN Fritzi Mandes, MD      . pantoprazole (PROTONIX) EC tablet 40 mg  40 mg Oral Daily Fritzi Mandes, MD   40 mg at 11/10/19 0848  . patiromer Daryll Drown) packet 16.8 g  16.8 g Oral Daily Nena Polio, MD   16.8 g at 11/10/19 1250  . pravastatin (PRAVACHOL) tablet 20 mg  20 mg Oral QHS Fritzi Mandes, MD   20 mg at 11/09/19 2202  . tamsulosin (FLOMAX) capsule 0.4 mg  0.4 mg Oral Daily Fritzi Mandes, MD   0.4 mg at 11/10/19 0848  . traZODone (DESYREL) tablet 50-100 mg  50-100 mg Oral QHS Fritzi Mandes, MD   50 mg at 11/09/19 2202  . umeclidinium-vilanterol (ANORO ELLIPTA) 62.5-25 MCG/INH 1 puff  1 puff Inhalation Daily Wendee Beavers T, MD      . vitamin B-12 (CYANOCOBALAMIN) tablet 1,000 mcg  1,000 mcg Oral Daily Fritzi Mandes, MD   1,000 mcg at 11/10/19 0847      Allergies: Allergies  Allergen Reactions  . Morphine And Related Other (See Comments)    Delerium, itching  . Nsaids Other (See Comments)    GI ulcers  . Aspirin Other (See Comments)    GI ulcers Can tolerate 81 mg daily      Past Medical History: Past Medical History:  Diagnosis Date  . Anxiety   . Asthma   . COPD (chronic obstructive pulmonary disease) ([place])   . Depression   . GERD (gastroesophageal reflux disease)   . Hypertension      Past Surgical History: Past Surgical History:  Procedure Laterality Date  . ABDOMINAL HYSTERECTOMY    . TOTAL HIP ARTHROPLASTY Left   . TUBAL LIGATION       Family History: Family History  Problem Relation Age of Onset  . Heart disease Father   . Heart disease Sister      Social History: Social History   Socioeconomic History  . Marital status: Single    Spouse name: Not on file  . Number of children: Not on file  . Years of education: Not on  file  . Highest education level: Not on file  Occupational History  . Occupation: retired  Tobacco Use  . Smoking status: Former Smoker    Packs/day: 0.75    Years: 40.00    Pack years: 30.00    Types: Cigarettes    Quit date: 11/05/2019    Years since quitting: 0.0  . Smokeless tobacco: Never Used  Vaping Use  . Vaping Use: Never used  Substance and Sexual Activity  . Alcohol use: No  . Drug use: No  . Sexual activity: Not on file  Other Topics Concern  . Not on file  Social History Narrative  . Not on file   Social Determinants of Health   Financial Resource Strain:   . Difficulty of Paying Living Expenses: Not on file  Food Insecurity:   . Worried About Charity fundraiser in the Last Year: Not on file  . Ran Out of Food in the Last Year: Not on file  Transportation Needs:   . Lack of Transportation (Medical): Not on file  . Lack of Transportation (Non-Medical): Not on file  Physical Activity:   . Days of Exercise per Week: Not on file  . Minutes of Exercise per Session: Not  on file  Stress:   . Feeling of Stress : Not on file  Social Connections:   . Frequency of Communication with Friends and Family: Not on file  . Frequency of Social Gatherings with Friends and Family: Not on file  . Attends Religious Services: Not on file  . Active Member of Clubs or Organizations: Not on file  . Attends Archivist Meetings: Not on file  . Marital Status: Not on file  Intimate Partner Violence:   . Fear of Current or Ex-Partner: Not on file  . Emotionally Abused: Not on file  . Physically Abused: Not on file  . Sexually Abused: Not on file     Review of Systems: Patient unable to offer accurate review of systems as she is a poor historian.  Vital Signs: Blood pressure (!) 154/40, pulse 78, temperature 99.4 F (37.4 C), temperature source Oral, resp. rate 20, height 5\' 3"  (1.6 m), weight 74.8 kg, SpO2 95 %.  Weight trends: Filed Weights   11/09/19 1130  11/09/19 1310  Weight: 68 kg 74.8 kg    Physical Exam: Physical Exam: General:  No acute distress  Head:  Normocephalic, atraumatic. Moist oral mucosal membranes  Eyes:  Anicteric  Neck:  Supple  Lungs:   Clear to auscultation, normal effort  Heart:  S1S2 no rubs  Abdomen:   Soft, nontender, bowel sounds present  Extremities:  Trace peripheral edema.  Neurologic:  Awake, alert, following commands  Skin:  No lesions       Lab results: Basic Metabolic Panel: Recent Labs  Lab 11/09/19 1129 11/09/19 1754 11/10/19 0535  NA 121*  --  133*  K 6.4* 4.8 5.2*  CL 90*  --  97*  CO2 23  --  27  GLUCOSE 122*  --  98  BUN 51*  --  42*  CREATININE 1.67*  --  1.32*  CALCIUM 8.9  --  10.0    Liver Function Tests: Recent Labs  Lab 11/09/19 1129  AST 26  ALT 31  ALKPHOS 87  BILITOT 0.9  PROT 6.6  ALBUMIN 3.8   No results for input(s): LIPASE, AMYLASE in the last 168 hours. No results for input(s): AMMONIA in the last 168 hours.  CBC: Recent Labs  Lab 11/09/19 1129  WBC 7.6  HGB 9.1*  HCT 27.2*  MCV 92.2  PLT 616*    Cardiac Enzymes: No results for input(s): CKTOTAL, CKMB, CKMBINDEX, TROPONINI in the last 168 hours.  BNP: Invalid input(s): POCBNP  CBG: No results for input(s): GLUCAP in the last 168 hours.  Microbiology: Results for orders placed or performed during the hospital encounter of 11/09/19  SARS Coronavirus 2 by RT PCR (hospital order, performed in [facility] hospital lab) Nasopharyngeal     Status: None   Collection Time: 11/09/19  3:08 PM   Specimen: Nasopharyngeal  Result Value Ref Range Status   SARS Coronavirus 2 NEGATIVE NEGATIVE Final    Comment: (NOTE) SARS-CoV-2 target nucleic acids are NOT DETECTED.  The SARS-CoV-2 RNA is generally detectable in upper and lower respiratory specimens during the acute phase of infection. The lowest concentration of SARS-CoV-2 viral copies this assay can detect is 250 copies / mL. A negative result  does not preclude SARS-CoV-2 infection and should not be used as the sole basis for treatment or other patient management decisions.  A negative result may occur with improper specimen collection / handling, submission of specimen other than nasopharyngeal swab, presence of viral mutation(s) within  the areas targeted by this assay, and inadequate number of viral copies (<250 copies / mL). A negative result must be combined with clinical observations, patient history, and epidemiological information.  Fact Sheet for Patients:   StrictlyIdeas.no  Fact Sheet for Healthcare Providers: BankingDealers.co.za  This test is not yet approved or  cleared by the [place] FDA and has been authorized for detection and/or diagnosis of SARS-CoV-2 by FDA under an Emergency Use Authorization (EUA).  This EUA will remain in effect (meaning this test can be used) for the duration of the COVID-19 declaration under Section 564(b)(1) of the Act, 21 U.S.C. section 360bbb-3(b)(1), unless the authorization is terminated or revoked sooner.  Performed at [facility], [address] [place], [place] [address]     Coagulation Studies: No results for input(s): LABPROT, INR in the last 72 hours.  Urinalysis: Recent Labs    11/09/19 1434  COLORURINE YELLOW*  LABSPEC 1.006  PHURINE 5.0  GLUCOSEU NEGATIVE  HGBUR NEGATIVE  BILIRUBINUR NEGATIVE  KETONESUR NEGATIVE  PROTEINUR NEGATIVE  NITRITE NEGATIVE  LEUKOCYTESUR NEGATIVE      Imaging: DG Chest 2 View  Result Date: 11/09/2019 CLINICAL DATA:  Short of breath 3 days. COPD. Hypertension. Smoker. EXAM: CHEST - 2 VIEW COMPARISON:  09/24/2019 FINDINGS: Heart size upper normal.  Heavily calcified aortic arch. Increased interstitial lung markings diffusely specially right lung base. Possible interstitial edema. Small bilateral pleural effusions. Interval improvement in aeration lung bases where  there previously was bibasilar atelectasis and effusion. IMPRESSION: Interval development of interstitial edema pattern likely due to fluid overload. Atypical infection also possible. Small bilateral effusions. Electronically Signed   By: Franchot Gallo M.D.   On: 11/09/2019 12:00   CT Renal Stone Study  Result Date: 11/09/2019 CLINICAL DATA:  Weakness and shortness of breath for 3 weeks. 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 EXAM: CT ABDOMEN AND PELVIS WITHOUT CONTRAST TECHNIQUE: Multidetector CT imaging of the abdomen and pelvis was performed following the standard protocol without IV contrast. COMPARISON:  CT abdomen and pelvis 09/21/2019. FINDINGS: Lower chest: The patient has small bilateral pleural effusions. Bibasilar airspace disease present on the prior study has markedly improved. Heart size is normal. Hepatobiliary: No focal liver abnormality is seen. No gallstones, gallbladder wall thickening, or biliary dilatation. Pancreas: Unremarkable. No pancreatic ductal dilatation or surrounding inflammatory changes. Spleen: Normal in size without focal abnormality. Adrenals/Urinary Tract: The left adrenal gland is enlarged at 2.3 x 1.7 cm. Hounsfield unit measurements are 23.6. The prior CT scan was contrast infused enhanced and the left adrenal had Hounsfield unit measurements of 147.5. 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 Left ureteral stent present on the prior CT has been removed. There is moderately severe left hydronephrosis. The left ureter is dilated and has material within it measuring 39 Hounsfield units in the mid ureter. Similar attenuating material is seen in the left ureter at the UVJ. Evaluation of the distal ureters somewhat limited due to streak artifact from the patient's left hip replacement. Right renal scarring is again seen. 2-3 nonobstructing stones in the right kidney are seen. The largest measures 0.5 cm. Stomach/Bowel: Stomach is within  normal limits. Small hiatal hernia is noted. The appendix is not seen but no evidence of appendicitis is present. No evidence of bowel wall thickening, distention, or inflammatory changes. Vascular/Lymphatic: Aortic atherosclerosis. No enlarged abdominal or pelvic lymph nodes. Reproductive: Status post hysterectomy. No adnexal masses. Other: There is a small volume of free pelvic  fluid. Small fat containing umbilical hernia noted. Musculoskeletal: No acute or focal abnormality. Left hip arthroplasty noted. IMPRESSION: Left renal stent seen on the prior CT has been removed. The patient has new moderately severe left hydroureteronephrosis. Increased attenuation within the mid and distal left ureter is likely due to hemorrhage but tumor within the ureter is also possible given the patient's history. Small bilateral pleural effusions. Bibasilar airspace disease seen on the prior CT is improved. Right renal scarring with 3 nonobstructing stones, unchanged. Small volume of pelvic ascites. Aortic Atherosclerosis (ICD10-I70.0). Electronically Signed   By: Inge Rise M.D.   On: 11/09/2019 14:23      Assessment & Plan: Pt is a 74 y.o. 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  1.  Acute kidney injury with baseline creatinine of 0.8. 2.  Left-sided hydroureteronephrosis after ureteral stent removal. 3.  Hyperkalemia. 4.  Hyponatremia.  Plan: Patient appears to be responding well to IV fluid hydration.  This is help correct renal insufficiency, hyponatremia, and hyperkalemia.  Continue hydration for now.  Continue to monitor serum electrolytes closely.  Avoid nephrotoxins as possible.  Appreciate urology input.

## 2019-11-10 NOTE — TOC Initial Note (Signed)
Transition of Care [facility]) - Initial/Assessment Note    Patient Details  Name: Pamela James MRN: [id number] Date of Birth: 11/05/1945  Transition of [facility]) CM/SW Contact:    Candie Chroman, LCSW Phone Number: 11/10/2019, 9:10 AM  Clinical Narrative: Readmission prevention screen complete. CSW met with patient. No supports at bedside. CSW introduced role and explained that discharge planning would be discussed. Patient lives home with her son and daughter-in-law. PCP is Dr. Tressia Miners at [facility]. Son drives her to appointments. [facility]. No issues obtaining medications. No home health prior to admission. Patient uses a cane as needed. PT eval pending. Patient currently on oxygen but does not use at home. No further concerns. CSW encouraged patient and contact CSW as needed. CSW will continue to follow patient for support and facilitate return home when stable.                 Expected Discharge Plan: [place] Barriers to Discharge: Continued Medical Work up   Patient Goals and CMS Choice        Expected Discharge Plan and Services Expected Discharge Plan: [place]       Living arrangements for the past 2 months: Single Family Home                                      Prior Living Arrangements/Services Living arrangements for the past 2 months: Single Family Home Lives with:: Adult Children Patient language and need for interpreter reviewed:: Yes Do you feel safe going back to the place where you live?: Yes      Need for Family Participation in Patient Care: Yes (Comment) Care giver support system in place?: Yes (comment) Current home services: DME Criminal Activity/Legal Involvement Pertinent to Current Situation/Hospitalization: No - Comment as needed  Activities of Daily Living Home Assistive Devices/Equipment: Cane (specify quad or straight) ADL Screening (condition at time of admission) Patient's  cognitive ability adequate to safely complete daily activities?: No Is the patient deaf or have difficulty hearing?: No Does the patient have difficulty seeing, even when wearing glasses/contacts?: No Does the patient have difficulty concentrating, remembering, or making decisions?: No Patient able to express need for assistance with ADLs?: Yes Does the patient have difficulty dressing or bathing?: No Independently performs ADLs?: Yes (appropriate for developmental age) Does the patient have difficulty walking or climbing stairs?: Yes Weakness of Legs: Both Weakness of Arms/Hands: None  Permission Sought/Granted                  Emotional Assessment Appearance:: Appears stated age Attitude/Demeanor/Rapport: Engaged, Gracious Affect (typically observed): Accepting, Appropriate, Calm, Pleasant Orientation: : Oriented to Self, Oriented to Place, Oriented to  Time, Oriented to Situation Alcohol / Substance Use: Not Applicable Psych Involvement: No (comment)  Admission diagnosis:  Hyperkalemia [E87.5] Hyponatremia [E87.1] Hypoxia [R09.02] Elevated troponin [R77.8] Junctional bradycardia [R00.1] AKI (acute kidney injury) ([place]) [N17.9] Acute hypoxemic respiratory failure (HCC) [J96.01] Dyspnea, unspecified type [R06.00] Acute congestive heart failure, unspecified heart failure type [facility]) [I50.9] Patient Active Problem List   Diagnosis Date Noted  . Acute hypoxemic respiratory failure ([place]) 11/09/2019  . Acute congestive heart failure ([place])   . AKI (acute kidney injury) ([place])   . Junctional bradycardia   . Hyponatremia   . Hyperkalemia   . Acute on chronic respiratory failure with hypoxia (HCC)  09/15/2019  . COPD exacerbation ([place]) 11/21/2017  . Acute bronchitis 03/26/2016  . Essential hypertension 03/26/2016  . Chest pain 03/25/2016   PCP:  Gladstone Lighter, MD Pharmacy:   [id number] [place] [id number] Phone: [phone]: [phone]    Social Determinants of Health (SDOH) Interventions    Readmission Risk Interventions Readmission Risk Prevention Plan 11/10/2019  Transportation Screening Complete  PCP or Specialist Appt within 3-5 Days Complete  Social Work Consult for [employer]way[employer]anning/Counseling Complete  Palliative Care Screening Not Applicable  Medication Review (Press photographer Complete  Some recent data might be hidden

## 2019-11-10 NOTE — Consult Note (Signed)
Cardiology Consultation:   Patient ID: Pamela James MRN: [id number]; DOB: 08-21-1945  Admit date: 11/09/2019 Date of Consult: 11/10/2019  Primary Care Provider: Gladstone Lighter, MD Primary Cardiologist: Iverson Alamin, Dr. Rockey Situ rounding Primary Electrophysiologist:  None    Patient Profile:   Pamela James is a 74 y.o. 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 Posey Pronto.  History of Present Illness:   Ms. Pamela James is a 74 year old female with PMH as above including hyperlipidemia, hypertension, hyperparathyroidism, anxiety/depression/bipolar, COPD, asthma, GERD, MGUS, left hydronephrosis s/p left ureteroscopy on 09/12/2019 with stent and tumor biopsy.   She has been followed closely by urology for gross hematuria and left-sided hydronephrosis.  She had a cytourethroscopy done 09/12/2019 and stent placement/biopsy of the ureter stent.  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  She follows with hematology for thrombocytosis and MGUS.   She was admitted 09/15/2019-09/27/19 to [facility] after an episode of syncope with bradycardia that resulted in CPR and admission to [facility]. Hospitalization was also complicated by aspiration pneumonia with Unasyn, pulmonary edema requiring IV Lasix, left subclavian clot, hypertensive urgency, ?alcohol withdrawal, metabolic encephalopathy, and bradycardia/hypotension. At presentation, she was found to be in acute hypoxic respiratory failure, intubated, and admitted to ICU .  She was hypotensive and bradycardic, requiring Precedex and dopamine drips.   She was  started on Eliquis for left subclavian clot.  She was extubated and transferred out of the ICU; however, due to hypertensive urgency, she required transfer back to the ICU for nicardipine drip.  She had pulmonary vascular congestion and received IV Lasix with subsequent improvement in respiratory status and placed back on room air.  She was evaluated by urology with notes indicating hematuria likely secondary to stenting and potential bleeding from ureteral tumor.  She was discharged home to live with her son 09/27/2019.  She was seen in follow-up by Dr. Tressia Miners 09/25/2019.  She reported chest pain 2/2 MSK from compressions.  She reported low-grade fevers at home.  BP was well controlled.  At subsequent follow-up, she was eating bacon and watermelon with extra salt on it.  BP 200/68.  Low-salt diet was advised.  She was continued on Norvasc, lisinopril, metoprolol, and hydralazine.  Potassium was high with possible discontinuation of lisinopril noted pending repeat labs.  She was still smoking and noted nicotine patches were too expensive.  Bupropion was started and she was continued on inhalers. She was on oxybutynin and Flomax with known left ureteral obstruction and hematuria, followed closely by urology.  09/2019 echo shows normal EF as below.  She reports today that she put down her cigarettes approximately 3 days before this admission.  On 11/09/2019, she presented to the emergency department after feeling increasing shortness of breath, orthopnea, PND, and weakness over the last few days.  In the ED, she was found to be hypoxic with saturations down to 85% and placed on 2 L nasal cannula oxygen.  She was also noted to be volume overloaded and hyperkalemic with AKI and ARF in the setting of obstructive nephropathy with severe left hydronephrosis.  Vital significant for HR 42 to 51 bpm.  Labs showed BNP 860, sodium 121, potassium 6.4,  creatinine 1.67 (baseline 0.83).  HS Tn 18 x2. Chest x-ray showed  interval development of interstitial edema pattern likely due to fluid overload and atypical infection possible.  Small bilateral effusions noted.  CT renal stone study showed removed left renal stent and new moderately severe left hydronephrosis.  Increased attenuation with mid and distal left ureter was thought due to hemorrhage by tumor within the ureter.  Small bilateral pleural effusions were noted, as well as improved bibasilar airspace disease and right renal scarring with 3 nonobstructing stones.  She was started on IV Lasix 20 mg, IV calcium gluconate, bicarb, dextrose with insulin, Veltassa.  Intermittent bradycardia was noted with atropine administered.   EKG showed junctional rhythm at 46 bpm, and nonspecific ST abnormality.  Cardiology was consulted and thought junctional rhythm most likely due to to electrolyte abnormality with recommendation to repeat electrolytes with suspected improvement in the her rhythm.  Telemetry today shows NSR, 70s, electrical alternans.  Labs show electrolytes improving.  Heart Pathway Score:     Past Medical History:  Diagnosis Date  . Anxiety   . Asthma   . COPD (chronic obstructive pulmonary disease) ([place])   . Depression   . GERD (gastroesophageal reflux disease)   . Hypertension     Past Surgical History:  Procedure Laterality Date  . ABDOMINAL HYSTERECTOMY    . TOTAL HIP ARTHROPLASTY Left   . TUBAL LIGATION       Home Medications:  Prior to Admission medications   Medication Sig Start Date End Date Taking? Authorizing Provider  alendronate (FOSAMAX) 70 MG tablet Take 70 mg by mouth once a week. Take with a full glass of water on an empty stomach.   Yes [provider]  amLODipine (NORVASC) 10 MG tablet Take 10 mg by mouth daily.   Yes [provider]  apixaban (ELIQUIS) 5 MG TABS tablet Take 1 tablet (5 mg total) by mouth 2 (two) times daily. 09/29/19  Yes Ezekiel Slocumb, DO  aspirin EC 81 MG EC tablet Take 1 tablet (81  mg total) by mouth daily. 03/27/16  Yes Hillary Bow, MD  buPROPion (WELLBUTRIN SR) 150 MG 12 hr tablet Take 150 mg by mouth 2 (two) times daily. 11/06/19  Yes [provider]  fluticasone-salmeterol (ADVAIR HFA) 230-21 MCG/ACT inhaler Inhale 2 puffs into the lungs 2 (two) times daily.   Yes [provider]  gabapentin (NEURONTIN) 300 MG capsule Take 300 mg by mouth 2 (two) times daily.    Yes [provider]  hydrALAZINE (APRESOLINE) 25 MG tablet Take 25 mg by mouth in the morning and at bedtime.   Yes [provider]  Ipratropium-Albuterol (COMBIVENT) 20-100 MCG/ACT AERS respimat Inhale 1 puff into the lungs 4 (four) times daily.   Yes [provider]  lisinopril (ZESTRIL) 40 MG tablet Take 40 mg by mouth daily. 11/03/19  Yes [provider]  metoprolol tartrate (LOPRESSOR) 25 MG tablet Take 1 tablet (25 mg total) by mouth 2 (two) times daily. 09/27/19  Yes Nicole Kindred A, DO  omeprazole (PRILOSEC) 20 MG capsule Take 20 mg by mouth daily.   Yes [provider]  pravastatin (PRAVACHOL) 20 MG tablet Take 20 mg by mouth at bedtime.    Yes [provider]  tamsulosin (FLOMAX) 0.4 MG CAPS capsule Take 0.4 mg by mouth daily.   Yes [provider]  traZODone (DESYREL) 100 MG tablet Take 50-100 mg by mouth at bedtime. 11/06/19  Yes [provider]  vitamin  B-12 (CYANOCOBALAMIN) 1000 MCG tablet Take 1,000 mcg by mouth daily.   Yes [provider]  albuterol (PROVENTIL HFA;VENTOLIN HFA) 108 (90 Base) MCG/ACT inhaler Inhale 2 puffs into the lungs every 6 (six) hours as needed for wheezing or shortness of breath.    [provider]  albuterol (PROVENTIL) (2.5 MG/3ML) 0.083% nebulizer solution Take 2.5 mg by nebulization every 6 (six) hours as needed for wheezing or shortness of breath.    [provider]  diphenhydrAMINE (BENADRYL) 25 mg capsule Take 25 mg by mouth every 6 (six) hours as needed  for itching or allergies.    [provider]  feeding supplement, ENSURE ENLIVE, (ENSURE ENLIVE) LIQD Take 237 mLs by mouth daily. 09/27/19   Nicole Kindred A, DO  lidocaine (LIDODERM) 5 % Place 1 patch onto the skin daily. Remove & Discard patch within 12 hours or as directed by MD 09/28/19   Ezekiel Slocumb, DO  nitroGLYCERIN (NITROSTAT) 0.4 MG SL tablet Place 1 tablet (0.4 mg total) under the tongue every 5 (five) minutes as needed for chest pain. 09/27/19   Ezekiel Slocumb, DO  ondansetron (ZOFRAN-ODT) 4 MG disintegrating tablet Take 4 mg by mouth every 8 (eight) hours as needed for nausea or vomiting. 09/12/19   [provider]    Inpatient Medications: Scheduled Meds: . amLODipine  10 mg Oral Daily  . buPROPion  150 mg Oral BID  . Chlorhexidine Gluconate Cloth  6 each Topical Daily  . docusate sodium  100 mg Oral BID  . furosemide  20 mg Intravenous Daily  . gabapentin  100 mg Oral BID  . hydrALAZINE  25 mg Oral BID  . pantoprazole  40 mg Oral Daily  . patiromer  16.8 g Oral Daily  . pravastatin  20 mg Oral QHS  . tamsulosin  0.4 mg Oral Daily  . traZODone  50-100 mg Oral QHS  . vitamin B-12  1,000 mcg Oral Daily   Continuous Infusions:  PRN Meds: acetaminophen **OR** acetaminophen, albuterol, ipratropium-albuterol, nitroGLYCERIN, ondansetron **OR** ondansetron (ZOFRAN) IV  Allergies:    Allergies  Allergen Reactions  . Morphine And Related Other (See Comments)    Delerium, itching  . Nsaids Other (See Comments)    GI ulcers  . Aspirin Other (See Comments)    GI ulcers Can tolerate 81 mg daily    Social History:   Social History   Socioeconomic History  . Marital status: Single    Spouse name: Not on file  . Number of children: Not on file  . Years of education: Not on file  . Highest education level: Not on file  Occupational History  . Occupation: retired  Tobacco Use  . Smoking status: Former Smoker    Packs/day: 0.75    Years: 40.00     Pack years: 30.00    Types: Cigarettes    Quit date: 11/05/2019    Years since quitting: 0.0  . Smokeless tobacco: Never Used  Vaping Use  . Vaping Use: Never used  Substance and Sexual Activity  . Alcohol use: No  . Drug use: No  . Sexual activity: Not on file  Other Topics Concern  . Not on file  Social History Narrative  . Not on file   Social Determinants of Health   Financial Resource Strain:   . Difficulty of Paying Living Expenses: Not on file  Food Insecurity:   . Worried About Charity fundraiser in the Last Year: Not on file  .  Ran Out of Food in the Last Year: Not on file  Transportation Needs:   . Lack of Transportation (Medical): Not on file  . Lack of Transportation (Non-Medical): Not on file  Physical Activity:   . Days of Exercise per Week: Not on file  . Minutes of Exercise per Session: Not on file  Stress:   . Feeling of Stress : Not on file  Social Connections:   . Frequency of Communication with Friends and Family: Not on file  . Frequency of Social Gatherings with Friends and Family: Not on file  . Attends Religious Services: Not on file  . Active Member of Clubs or Organizations: Not on file  . Attends Archivist Meetings: Not on file  . Marital Status: Not on file  Intimate Partner Violence:   . Fear of Current or Ex-Partner: Not on file  . Emotionally Abused: Not on file  . Physically Abused: Not on file  . Sexually Abused: Not on file    Family History:     Family History  Problem Relation Age of Onset  . Heart disease Father   . Heart disease Sister      ROS:  Please see the history of present illness.  Review of Systems  Constitutional: Positive for fever and malaise/fatigue.  Respiratory: Positive for shortness of breath and wheezing.   Cardiovascular: Positive for orthopnea. Negative for chest pain, palpitations and leg swelling.  Musculoskeletal: Negative for falls.  Neurological: Negative for loss of consciousness.    All other systems reviewed and are negative.   All other ROS reviewed and negative.     Physical Exam/Data:   Vitals:   11/10/19 0145 11/10/19 0200 11/10/19 0215 11/10/19 0344  BP: (!) 143/74 (!) 163/47 (!) 163/51 (!) 151/42  Pulse: 76 72 71 74  Resp:   13 20  Temp: 99 F (37.2 C) 99 F (37.2 C) 99 F (37.2 C) 98.5 F (36.9 C)  TempSrc:    Oral  SpO2: 94% 94% 96% 95%  Weight:      Height:        Intake/Output Summary (Last 24 hours) at 11/10/2019 0819 Last data filed at 11/10/2019 0254 Gross per 24 hour  Intake --  Output 3740 ml  Net -3740 ml   Last 3 Weights 11/09/2019 11/09/2019 10/10/2019  Weight (lbs) 164 lb 12.8 oz 150 lb 135 lb  Weight (kg) 74.753 kg 68.04 kg 61.236 kg     Body mass index is 29.19 kg/m.  General:  Well nourished, well developed, in no acute distress.  On 4 L nasal cannula oxygen HEENT: normal Neck: + JVD Vascular: No carotid bruits; radial pulses 2+ bilaterally Cardiac:  normal S1, S2; RRR; no murmur  Lungs: Bibasilar crackles, bilateral wheezing Abd: Obese, no hepatomegaly  Ext: no pitting edema Musculoskeletal:  No deformities, BUE and BLE strength normal and equal Skin: warm and dry  Neuro:  No focal abnormalities noted Psych:  Normal affect   EKG:  The EKG was personally reviewed and demonstrates:  junctional rhythm at 46 bpm, and nonspecific ST abnormality. Telemetry:  Telemetry was personally reviewed and demonstrates: NSR, 70s, electrical alternans  Relevant CV Studies: Echo  09/2019 1. Left ventricular ejection fraction, by estimation, is 60 to 65%. The  left ventricle has normal function. The left ventricle has no regional  wall motion abnormalities. Left ventricular diastolic parameters were  normal.  2. Right ventricular systolic function is normal. The right ventricular  size is normal.  3. The mitral valve is grossly normal. No evidence of mitral valve  regurgitation.  4. The aortic valve is grossly normal. Aortic  valve regurgitation is not  visualized. Echo 07/2019  Echo 07/2019   [facility]      Kierrah, Kilbride                            [id number]  DOB: [birth year]         CARDIAC DIAGNOSTIC UNIT        Date: 08/08/2019 14:30:00                            Adult   Female Age: 40          ECHO-DOPPLER REPORT         Outpatient                            MPDC  ---------------------------------------------------- MD1: Romeo Apple   STUDY: Chest Wall     TAPE: 0000:00:0:00:00 BP: 100/91    ECHO: Yes  DOPPLER: Yes FILE: [id number]:  HR: 52   COLOR: Yes CONTRAST: No   MACHINE: [id number] #3 Height: 62 in  RV BIOPSY: No     3D: No  SOUND QLTY: Moderate Weight: 139 lbs   MEDIUM: None                   BSA: 1.66, BMI: 25.40  ------------------------------------------------------------------------------   HISTORY: pre- Surgery   REASON: Assess LV function Assess RV function  INDICATION: I10 - Essential (primary) hypertension. H06.237 - Encounter for       other preprocedural examination. R01.1 - Cardiac murmur,       unspecified.      ECHOCARDIOGRAPHIC MEASUREMENTS -----------------------------------------------  2D DIMENSIONS  AORTA     Values   Normal RangeMAIN PA   Values   Normal Range    Annulus: nm* cm  [1.9 - 2.7]  PA Main: nm* cm  [1.5 - 2.1]   Aorta Sin: nm* cm  [2.4 - 3.6] RIGHT VENTRICLE  ST Junction: nm* cm  [2 - 3.2]   RV Base:  3.7 cm  [2.5 - 4.1]   Asc.Aorta: nm* cm  [1.9 - 3.5]   RV Mid:  2.5 cm  [1.9 - 3.5]  LEFT VENTRICLE             RV Length: nm* cm  [ ]      LVIDd:  4.3 cm  [3.7 - 5.3] RIGHT ATRIUM     LVIDs:  2.4 cm  [2.2 - 3.4]  RA Area: 12  cm2  [ <= 20]    LVEDVi:  76.0 ml/m2 [29 - 61]     RAVi: 14  ml/m2 [15 - 27]    LVESVi: 21.0 ml/m2 [8 - 24]  INFERIOR VENA CAVA      FS: 44  %   [ >= 25]    Max.IVC:  1.6 cm  [ <= 2.1]      SWT:  1.1 cm  [0.6 - 0.9]  Min.IVC: nm* cm  [ <= 1.7]      PWT:  1.1 cm  [0.6 - 0.9] __________________  LEFT ATRIUM              nm* - not measured    LA Diam:  3.7 cm  [  2.7 - 3.8]    LA Area: 18  cm2  [ <= 20]   LA Volume: 52  ml  [22 - 52]     LAVi: 31  ml/m2 [16 - 34]    ECHOCARDIOGRAPHIC DESCRIPTIONS -----------------------------------------------  AORTIC ROOT     Size: Not seen  Dissection: INDETERM FOR DISSECTION    AORTIC VALVE   Leaflets: Tricuspid       Morphology: MILDLY THICKENED   Mobility: Fully Mobile    LEFT VENTRICLE                   Anterior: Normal     Size: Normal                 Lateral: Normal  Contraction: Normal                 Septal: Normal  Closest EF: >55%(Estimated) Calc.EF: 72% (3D)   Apical: Normal   LV masses: No Masses               Inferior: Normal      LVH: MILD LVH CONCENTRIC         Posterior: Normal  LV GLS(AVG): -17.6% Normal Range [ <= -16]  Dias.FxClass: Normal    MITRAL VALVE   Leaflets: Normal         Mobility: Fully mobile  Morphology: Normal    LEFT ATRIUM     Size: Normal   LA masses: No masses        Normal IAS    MAIN PA     Size: Normal    PULMONIC VALVE  Morphology: Normal   Mobility: Fully Mobile    RIGHT VENTRICLE     Size: Normal          Free wall: Normal  Contraction: Normal          RV masses: No Masses     TAPSE:  1.9 cm, Normal Range [>= 1.6 cm]    TRICUSPID VALVE   Leaflets: Normal         Mobility: Fully mobile  Morphology: Normal     RIGHT ATRIUM     Size: Normal           RA Other: None   RA masses: No masses    PERICARDIUM     Fluid: TRIVIAL FLUID    INFERIOR VENACAVA     Size: Normal   Normal respiratory collapse    DOPPLER ECHO and OTHER SPECIAL PROCEDURES ------------------------------------   Aortic: No AR         No AS     Mitral: TRIVIAL MR       No MS   MV Inflow E Vel.= 74.0 cm/s MV Annulus E'Vel.= 8.5 cm/s E/E'Ratio= 9    Tricuspid: TRIVIAL TR       No TS    Pulmonary: MILD PR        No PS     Other:    INTERPRETATION ---------------------------------------------------------------  NORMAL LEFT VENTRICULAR SYSTOLIC FUNCTION WITH MILD LVH  NORMAL LA PRESSURES WITH NORMAL DIASTOLIC FUNCTION  NORMAL RIGHT VENTRICULAR SYSTOLIC FUNCTION  VALVULAR REGURGITATION: TRIVIAL MR, MILD PR, TRIVIAL TR  NO VALVULAR STENOSIS  TRIVIAL PERICARDIAL EFFUSION    Compared with prior Echo study on 02/22/2009: ([place]): NO  SIGNIFICANT CHANGES      (Report version 3.0)          Interpreted and Electronically signed  Perform. by: Livingston Diones, RDCS  by: Ignatius Specking, MD  Resp.Person: Livingston Diones, RDCS        On: 08/08/2019 17:25:43       Laboratory Data:  High Sensitivity Troponin:   Recent Labs  Lab 11/09/19 1129 11/09/19 1305  TROPONINIHS 18* 18*     Cardiac EnzymesNo results for input(s): TROPONINI in the last 168 hours. No results for input(s): TROPIPOC in the last 168 hours.  Chemistry Recent Labs  Lab 11/09/19 1129 11/09/19 1754 11/10/19 0535  NA 121*  --  133*  K 6.4* 4.8 5.2*  CL 90*  --  97*  CO2 23  --  27  GLUCOSE 122*  --  98  BUN 51*  --  42*  CREATININE 1.67*  --  1.32*  CALCIUM 8.9  --  10.0  GFRNONAA 30*  --  39*  GFRAA 34*  --  46*  ANIONGAP 8  --  9    Recent Labs  Lab 11/09/19 1129  PROT 6.6  ALBUMIN 3.8  AST  26  ALT 31  ALKPHOS 87  BILITOT 0.9   Hematology Recent Labs  Lab 11/09/19 1129  WBC 7.6  RBC 2.95*  HGB 9.1*  HCT 27.2*  MCV 92.2  MCH 30.8  MCHC 33.5  RDW 15.3  PLT 616*   BNP Recent Labs  Lab 11/09/19 1129  BNP 860.2*    DDimer No results for input(s): DDIMER in the last 168 hours.   Radiology/Studies:  DG Chest 2 View  Result Date: 11/09/2019 CLINICAL DATA:  Short of breath 3 days. COPD. Hypertension. Smoker. EXAM: CHEST - 2 VIEW COMPARISON:  09/24/2019 FINDINGS: Heart size upper normal.  Heavily calcified aortic arch. Increased interstitial lung markings diffusely specially right lung base. Possible interstitial edema. Small bilateral pleural effusions. Interval improvement in aeration lung bases where there previously was bibasilar atelectasis and effusion. IMPRESSION: Interval development of interstitial edema pattern likely due to fluid overload. Atypical infection also possible. Small bilateral effusions. Electronically Signed   By: Franchot Gallo M.D.   On: 11/09/2019 12:00   CT Renal Stone Study  Result Date: 11/09/2019 CLINICAL DATA:  Weakness and shortness of breath for 3 weeks. 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 EXAM: CT ABDOMEN AND PELVIS WITHOUT CONTRAST TECHNIQUE: Multidetector CT imaging of the abdomen and pelvis was performed following the standard protocol without IV contrast. COMPARISON:  CT abdomen and pelvis 09/21/2019. FINDINGS: Lower chest: The patient has small bilateral pleural effusions. Bibasilar airspace disease present on the prior study has markedly improved. Heart size is normal. Hepatobiliary: No focal liver abnormality is seen. No gallstones, gallbladder wall thickening, or biliary dilatation. Pancreas: Unremarkable. No pancreatic ductal dilatation or surrounding inflammatory changes. Spleen: Normal in size without focal abnormality. Adrenals/Urinary Tract: The left adrenal gland is enlarged at 2.3 x 1.7 cm. Hounsfield  unit measurements are 23.6. The prior CT scan was contrast infused enhanced and the left adrenal had Hounsfield unit measurements of 147.5. 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 Left ureteral stent present on the prior CT has been removed. There is moderately severe left hydronephrosis. The left ureter is dilated and has material within it measuring 39 Hounsfield units in the mid ureter. Similar attenuating material is seen in the left ureter at the UVJ. Evaluation of the distal ureters somewhat limited due to streak artifact from the patient's left hip replacement. Right renal scarring is again seen. 2-3 nonobstructing stones in the right kidney are seen. The largest measures 0.5  cm. Stomach/Bowel: Stomach is within normal limits. Small hiatal hernia is noted. The appendix is not seen but no evidence of appendicitis is present. No evidence of bowel wall thickening, distention, or inflammatory changes. Vascular/Lymphatic: Aortic atherosclerosis. No enlarged abdominal or pelvic lymph nodes. Reproductive: Status post hysterectomy. No adnexal masses. Other: There is a small volume of free pelvic fluid. Small fat containing umbilical hernia noted. Musculoskeletal: No acute or focal abnormality. Left hip arthroplasty noted. IMPRESSION: Left renal stent seen on the prior CT has been removed. The patient has new moderately severe left hydroureteronephrosis. Increased attenuation within the mid and distal left ureter is likely due to hemorrhage but tumor within the ureter is also possible given the patient's history. Small bilateral pleural effusions. Bibasilar airspace disease seen on the prior CT is improved. Right renal scarring with 3 nonobstructing stones, unchanged. Small volume of pelvic ascites. Aortic Atherosclerosis (ICD10-I70.0). Electronically Signed   By: Inge Rise M.D.   On: 11/09/2019 14:23    Assessment and Plan:   Acute on chronic hypoxic respiratory failure Acute on  chronic diastolic congestive heart failure --Remains on 4 L nasal cannula oxygen.  She is not currently on home oxygen.  She was admitted 8/26 after presenting with increasing shortness of breath and weakness.  Labs as above showed significant electrolyte abnormalities and volume overload, as well as possible atypical pneumonia/infection. 09/2019 echo with normal EF. --Still volume up on exam.   --Continue IV Lasix and titrate for goal 2 L daily or euvolemic on exam. --Monitor I's/O's, daily weights.  --Daily BMET.  --Limited echo ordered to reassess pump function and r/o pericarditis or effusion in the setting of her comorbid's with electrical alternans on telemetry. --No ACE/ARB in the setting of AKI. --No BB in the setting of bradycardia/junctional rhythm at admission. --Monitor BMET closely as we continue IV diuresis, given AKI and electrolyte abnormalities. --As volume status improves, wean oxygen as tolerated. --Further recommendations pending echo.  Junctional rhythm, resolved Electrical alternans, seen on telemetry --Junctional rhythm now resolved and likely 2/2 electrolyte abnormalities.   --Electrical alternans seen on telemetry.  Limited echo pending to ensure no cardiac effusion and reassess pump function and r/o acute structural abnormalities.  Hyperkalemia, hyponatremia Acute renal failure Left severe hydroureteronephrosis, recurrent --Electrolyte abnormalities and acute renal failure with recurrent left severe hydroureteronephrosis.  Received treatment for electrolyte abnormalities with IV calcium gluconate, bicarb, dextrose, insulin, and Veltassa.  Electrolytes improving today.  [facility] consulted.  --Caution with nephrotoxins.  Follow BMET closely on IV diuresis.  No ACE/ARB/Arni.  Avoid contrast.  History of left subclavian clot --Placed on Eliquis for left subclavian clot.  This has been held, given she may require upcoming urology procedures.  We will need to monitor closely.   Consider ultrasound of left subclavian to reassess.  Anemia of chronic disease. --Daily CBC.   For questions or updates, please contact [place] Please consult [email or web address] for contact info under     Signed, Arvil Chaco, PA-C  11/10/2019 8:19 AM

## 2019-11-10 NOTE — ED Notes (Signed)
Pt states breathing has improved after receiving breathing tx. NAD noted, VSS.

## 2019-11-10 NOTE — Progress Notes (Signed)
PROGRESS NOTE  Pamela James OMV:[id number] DOB: 16-Dec-1945   PCP: Gladstone Lighter, MD  Patient is from: Home.  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  In ED, bradycardic to 40s.  RR in mid 20s.  Desaturated to 87% on RA at rest and put on 2 L by nasal cannula.  Hgb 9.1.  Platelets 616. Na 121.  K6.4. Cr 1.67.  BUN 51.  BNP 860.  High-sensitivity troponin 18.  EKG junctional rhythm at the rate of 53/min.  CXR with interstitial edema concerning for CHF.  Lactic acid negative.  COVID-19 PCR negative.  Urinalysis without significant finding.  CT abdomen and pelvis with new moderately severe left hydroureteronephrosis and small bilateral pleural effusions.  Cardiology consulted.  Urology consulted and recommended IR for nephrostomy if no improvement in renal function and electrolytes.  Nephrology consulted as well.  Patient received atropine, calcium gluconate, dextrose, insulin, IV Lasix, Veltassa, and admitted for acute respiratory failure with hypoxia due to CHF exacerbation, AKI in the setting of left hydronephrosis, hyponatremia and hyperkalemia.  Subjective: Seen and examined earlier this morning.  No major events overnight of this morning.  Reports improvement in her breathing.  Continues to have dry cough.  Chest feels tender from coughing a lot.  She denies abdominal pain or new back pain.  Denies fever, chills, nausea or vomiting.  Objective: Vitals:   11/10/19 0200 11/10/19 0215 11/10/19 0344  11/10/19 1137  BP: (!) 163/47 (!) 163/51 (!) 151/42 (!) 154/40  Pulse: 72 71 74 78  Resp:  13 20   Temp: 99 F (37.2 C) 99 F (37.2 C) 98.5 F (36.9 C) 99.4 F (37.4 C)  TempSrc:   Oral Oral  SpO2: 94% 96% 95% 95%  Weight:      Height:        Intake/Output Summary (Last 24 hours) at 11/10/2019 1200 Last data filed at 11/10/2019 1015 Gross per 24 hour  Intake --  Output 6290 ml  Net -6290 ml   Filed Weights   11/09/19 1130 11/09/19 1310  Weight: 68 kg 74.8 kg    Examination:  GENERAL: No apparent distress.  Nontoxic. HEENT: MMM.  Vision and hearing grossly intact.  NECK: Supple.  No apparent JVD.  RESP: 95% on 4 L.  No IWOB.  Upper airway sounds transmitted throughout.  No crackles. CVS:  RRR. Heart sounds normal.  ABD/GI/GU: BS+. Abd soft, NTND.  No CVA tenderness. MSK/EXT:  Moves extremities. No apparent deformity. No edema.  SKIN: no apparent skin lesion or wound NEURO: Awake, alert and oriented appropriately.  No apparent focal neuro deficit. PSYCH: Calm. Normal affect.   Procedures:  None.  Microbiology summarized: COVID-19 PCR negative.  Assessment & Plan: Acute respiratory failure with hypoxia-likely due to fluid overload/CHF and patient with underlying COPD.  Not on oxygen at home.  Currently on 4 L. -Wean oxygen to room air if possible -Ambulatory saturation assessment -Treat CHF as below.  -Encourage incentive spirometry  Acute on chronic diastolic CHF: Echo on 08/15/8364 with EF of 60 to 65% and no other significant  finding.  No signs of fluid overload on exam but elevated BNP and chest x-ray concerning for interstitial edema.  Breathing improved with IV Lasix.  She had about 3.7 L UOP/24 hours.  Creatinine downtrending.  She has elevated pulse pressure with SBP in 150s and DBP in 40s. -Continue IV Lasix 20 mg daily per nephrology and cardiology -Agree with repeat echocardiogram given wide pulse pressure and significant bradycardia -Monitor fluid status  and renal function closely  Bradycardia with junctional rhythm: Heart rate in 40s in ED. She is on metoprolol. Received a dose of atropine.  HR in 70s now. 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  Stents removed in July due to significant pain.  Per urology, patient refused stent out of concern for pain.   -Baseline Cr 0.8> 1.67 (admit)> 1.32-improving. -Continue holding home lisinopril -Continue home flomax -Avoid/minimize nephrotoxic meds -Continue monitoring renal function and electrolytes -Urology recommended consulting IR for nephrostomy tube if no improvement in AKI electrolytes.  -Nephrology following.   Hyponatremia-likely hypervolemic in the setting of renal failure and CHF.  Na 121 (admit)> 133.  Slightly overcorrected.  No mental status change. -Continue monitoring  Hyperkalemia: K6.4 (admit)>> 5.2.  Received calcium gluconate, dextrose, insulin and Veltassa -Continue Veltassa -Follow nephrology recommendations  Essential hypertension: SBP in 150's. DBP in 40's -Continue amlodipine.  Also started on low-dose hydralazine -Hold home lisinopril in the setting of AKI. -Hold home metoprolol in the setting of bradycardia.  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 -Hold Eliquis -IV heparin for anticoagulation  Normocytic anemia: Hgb 9.1 (baseline) -Continue monitoring  Anxiety/depression/bipolar disorder: Stable -Continue home medications  Chronic COPD: Not on oxygen at home.  Uses Advair and Combivent at home.  She has upper airway sound on exam. -Add Anoro Ellipta -Continue as needed DuoNeb  Noncompliance with medications -Counseled.  Tobacco use disorder -Encouraged cessation -Nicotine  patch.  Thrombocytosis: Chronic.  Unclear etiology. -May need outpatient hematology evaluation if it persists.  Debility/generalized weakness/physical deconditioning -PT/OT  At risk for polypharmacy-patient son concerned about the way she takes her medications -May need home RN  Poor p.o. intake: Patient's son reports poor p.o. intake and concerned Body mass index is 29.19 kg/m.      Wt Readings from Last 10 Encounters:  11/09/19 74.8 kg  10/10/19 61.2 kg  09/27/19 61.1 kg  11/21/17 75.8 kg  06/04/16 75.8 kg  03/25/16 72.6 kg  -Consult dietitian. -Multivitamins.     DVT prophylaxis:  SCDs Start: 11/09/19 1639  Code Status: Full code Family Communication: Updated patient's son over the phone at patient's request. Status is: Inpatient  Remains inpatient appropriate because:Persistent severe electrolyte disturbances, Ongoing diagnostic testing needed not appropriate for outpatient work up, IV treatments appropriate due to intensity of illness or inability to take PO and Inpatient level of care appropriate due to severity of illness.  On IV Lasix for CHF.  Still with hyponatremia and hyperkalemia.  Also with AKI.   Dispo: The patient is from: Home              Anticipated d/c is to: Home              Anticipated d/c date is: 2 days              Patient  currently is not medically stable to d/c.       Consultants:  Cardiology Nephrology Urology   Sch Meds:  Scheduled Meds: . amLODipine  10 mg Oral Daily  . buPROPion  150 mg Oral BID  . Chlorhexidine Gluconate Cloth  6 each Topical Daily  . docusate sodium  100 mg Oral BID  . furosemide  20 mg Intravenous Daily  . gabapentin  100 mg Oral BID  . hydrALAZINE  25 mg Oral BID  . pantoprazole  40 mg Oral Daily  . patiromer  16.8 g Oral Daily  . pravastatin  20 mg Oral QHS  . tamsulosin  0.4 mg Oral Daily  . traZODone  50-100 mg Oral QHS  . vitamin B-12  1,000 mcg Oral Daily   Continuous Infusions: PRN  Meds:.acetaminophen **OR** acetaminophen, albuterol, ipratropium-albuterol, nitroGLYCERIN, ondansetron **OR** ondansetron (ZOFRAN) IV  Antimicrobials: Anti-infectives (From admission, onward)   None       I have personally reviewed the following labs and images: CBC: Recent Labs  Lab 11/09/19 1129  WBC 7.6  HGB 9.1*  HCT 27.2*  MCV 92.2  PLT 616*   BMP &GFR Recent Labs  Lab 11/09/19 1129 11/09/19 1754 11/10/19 0535  NA 121*  --  133*  K 6.4* 4.8 5.2*  CL 90*  --  97*  CO2 23  --  27  GLUCOSE 122*  --  98  BUN 51*  --  42*  CREATININE 1.67*  --  1.32*  CALCIUM 8.9  --  10.0   Estimated Creatinine Clearance: 35.7 mL/min (A) (by C-G formula based on SCr of 1.32 mg/dL (H)). Liver & Pancreas: Recent Labs  Lab 11/09/19 1129  AST 26  ALT 31  ALKPHOS 87  BILITOT 0.9  PROT 6.6  ALBUMIN 3.8   No results for input(s): LIPASE, AMYLASE in the last 168 hours. No results for input(s): AMMONIA in the last 168 hours. Diabetic: No results for input(s): HGBA1C in the last 72 hours. No results for input(s): GLUCAP in the last 168 hours. Cardiac Enzymes: No results for input(s): CKTOTAL, CKMB, CKMBINDEX, TROPONINI in the last 168 hours. No results for input(s): PROBNP in the last 8760 hours. Coagulation Profile: No results for input(s): INR, PROTIME in the last 168 hours. Thyroid Function Tests: No results for input(s): TSH, T4TOTAL, FREET4, T3FREE, THYROIDAB in the last 72 hours. Lipid Profile: No results for input(s): CHOL, HDL, LDLCALC, TRIG, CHOLHDL, LDLDIRECT in the last 72 hours. Anemia Panel: No results for input(s): VITAMINB12, FOLATE, FERRITIN, TIBC, IRON, RETICCTPCT in the last 72 hours. Urine analysis:    Component Value Date/Time   COLORURINE YELLOW (A) 11/09/2019 1434   APPEARANCEUR CLEAR (A) 11/09/2019 1434   LABSPEC 1.006 11/09/2019 1434   PHURINE 5.0 11/09/2019 1434   GLUCOSEU NEGATIVE 11/09/2019 1434   HGBUR NEGATIVE 11/09/2019 1434   BILIRUBINUR  NEGATIVE 11/09/2019 1434   KETONESUR NEGATIVE 11/09/2019 1434   PROTEINUR NEGATIVE 11/09/2019 1434   NITRITE NEGATIVE 11/09/2019 1434   LEUKOCYTESUR NEGATIVE 11/09/2019 1434   Sepsis Labs: Invalid input(s): PROCALCITONIN, [place]  Microbiology: Recent Results (from the past 240 hour(s))  SARS Coronavirus 2 by RT PCR (hospital order, performed in [facility] hospital lab) Nasopharyngeal     Status: None   Collection Time: 11/09/19  3:08 PM   Specimen: Nasopharyngeal  Result Value Ref Range Status   SARS Coronavirus 2 NEGATIVE NEGATIVE Final    Comment: (NOTE) SARS-CoV-2 target nucleic acids are NOT DETECTED.  The SARS-CoV-2 RNA is  generally detectable in upper and lower respiratory specimens during the acute phase of infection. The lowest concentration of SARS-CoV-2 viral copies this assay can detect is 250 copies / mL. A negative result does not preclude SARS-CoV-2 infection and should not be used as the sole basis for treatment or other patient management decisions.  A negative result may occur with improper specimen collection / handling, submission of specimen other than nasopharyngeal swab, presence of viral mutation(s) within the areas targeted by this assay, and inadequate number of viral copies (<250 copies / mL). A negative result must be combined with clinical observations, patient history, and epidemiological information.  Fact Sheet for Patients:   StrictlyIdeas.no  Fact Sheet for Healthcare Providers: BankingDealers.co.za  This test is not yet approved or  cleared by the [place] FDA and has been authorized for detection and/or diagnosis of SARS-CoV-2 by FDA under an Emergency Use Authorization (EUA).  This EUA will remain in effect (meaning this test can be used) for the duration of the COVID-19 declaration under Section 564(b)(1) of the Act, 21 U.S.C. section 360bbb-3(b)(1), unless the authorization is  terminated or revoked sooner.  Performed at [facility], [address] [place], [place] [address]     Radiology Studies: CT Renal Stone Study  Result Date: 11/09/2019 CLINICAL DATA:  Weakness and shortness of breath for 3 weeks. 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 EXAM: CT ABDOMEN AND PELVIS WITHOUT CONTRAST TECHNIQUE: Multidetector CT imaging of the abdomen and pelvis was performed following the standard protocol without IV contrast. COMPARISON:  CT abdomen and pelvis 09/21/2019. FINDINGS: Lower chest: The patient has small bilateral pleural effusions. Bibasilar airspace disease present on the prior study has markedly improved. Heart size is normal. Hepatobiliary: No focal liver abnormality is seen. No gallstones, gallbladder wall thickening, or biliary dilatation. Pancreas: Unremarkable. No pancreatic ductal dilatation or surrounding inflammatory changes. Spleen: Normal in size without focal abnormality. Adrenals/Urinary Tract: The left adrenal gland is enlarged at 2.3 x 1.7 cm. Hounsfield unit measurements are 23.6. The prior CT scan was contrast infused enhanced and the left adrenal had Hounsfield unit measurements of 147.5. 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 Left ureteral stent present on the prior CT has been removed. There is moderately severe left hydronephrosis. The left ureter is dilated and has material within it measuring 39 Hounsfield units in the mid ureter. Similar attenuating material is seen in the left ureter at the UVJ. Evaluation of the distal ureters somewhat limited due to streak artifact from the patient's left hip replacement. Right renal scarring is again seen. 2-3 nonobstructing stones in the right kidney are seen. The largest measures 0.5 cm. Stomach/Bowel: Stomach is within normal limits. Small hiatal hernia is noted. The appendix is not seen but no evidence of appendicitis is present. No evidence of bowel wall  thickening, distention, or inflammatory changes. Vascular/Lymphatic: Aortic atherosclerosis. No enlarged abdominal or pelvic lymph nodes. Reproductive: Status post hysterectomy. No adnexal masses. Other: There is a small volume of free pelvic fluid. Small fat containing umbilical hernia noted. Musculoskeletal: No acute or focal abnormality. Left hip arthroplasty noted. IMPRESSION: Left renal stent seen on the prior CT has been removed. The patient has new moderately severe left hydroureteronephrosis. Increased attenuation within the mid and distal left ureter is likely due to hemorrhage but tumor within the ureter is also possible given the patient's history. Small bilateral pleural effusions. Bibasilar airspace disease seen on the prior CT is improved. Right renal scarring  with 3 nonobstructing stones, unchanged. Small volume of pelvic ascites. Aortic Atherosclerosis (ICD10-I70.0). Electronically Signed   By: Inge Rise M.D.   On: 11/09/2019 14:23      Charlet Harr T. [place]  If 7PM-7AM, please contact night-coverage [email or web address] 11/10/2019, 12:00 PM

## 2019-11-10 NOTE — Progress Notes (Addendum)
Urology Inpatient Progress Note  Subjective: No acute events overnight. Creatinine down today, 1.32. Foley catheter in place draining clear, yellow urine. Patient denies flank or lower abdominal pain today.  No acute concerns.  Anti-infectives: Anti-infectives (From admission, onward)   None      Current Facility-Administered Medications  Medication Dose Route Frequency Provider Last Rate Last Admin  . acetaminophen (TYLENOL) tablet 650 mg  650 mg Oral Q6H PRN Fritzi Mandes, MD       Or  . acetaminophen (TYLENOL) suppository 650 mg  650 mg Rectal Q6H PRN Fritzi Mandes, MD      . albuterol (PROVENTIL) (2.5 MG/3ML) 0.083% nebulizer solution 2.5 mg  2.5 mg Nebulization Q2H PRN Fritzi Mandes, MD   2.5 mg at 11/10/19 0009  . amLODipine (NORVASC) tablet 10 mg  10 mg Oral Daily Fritzi Mandes, MD   10 mg at 11/10/19 0848  . buPROPion (WELLBUTRIN SR) 12 hr tablet 150 mg  150 mg Oral BID Fritzi Mandes, MD   150 mg at 11/09/19 2201  . Chlorhexidine Gluconate Cloth 2 % PADS 6 each  6 each Topical Daily Fritzi Mandes, MD      . docusate sodium (COLACE) capsule 100 mg  100 mg Oral BID Fritzi Mandes, MD   100 mg at 11/10/19 0848  . furosemide (LASIX) injection 20 mg  20 mg Intravenous Daily Fritzi Mandes, MD   20 mg at 11/10/19 0849  . gabapentin (NEURONTIN) capsule 100 mg  100 mg Oral BID Fritzi Mandes, MD   100 mg at 11/10/19 0848  . hydrALAZINE (APRESOLINE) tablet 25 mg  25 mg Oral BID Fritzi Mandes, MD   25 mg at 11/10/19 0848  . ipratropium-albuterol (DUONEB) 0.5-2.5 (3) MG/3ML nebulizer solution 3 mL  3 mL Inhalation Q6H PRN Fritzi Mandes, MD      . nitroGLYCERIN (NITROSTAT) SL tablet 0.4 mg  0.4 mg Sublingual Q5 min PRN Fritzi Mandes, MD      . ondansetron [facility]) tablet 4 mg  4 mg Oral Q6H PRN Fritzi Mandes, MD       Or  . ondansetron [facility]) injection 4 mg  4 mg Intravenous Q6H PRN Fritzi Mandes, MD      . pantoprazole (PROTONIX) EC tablet 40 mg  40 mg Oral Daily Fritzi Mandes, MD   40 mg at 11/10/19 0848  .  patiromer Daryll Drown) packet 16.8 g  16.8 g Oral Daily Nena Polio, MD   16.8 g at 11/09/19 1502  . pravastatin (PRAVACHOL) tablet 20 mg  20 mg Oral QHS Fritzi Mandes, MD   20 mg at 11/09/19 2202  . tamsulosin (FLOMAX) capsule 0.4 mg  0.4 mg Oral Daily Fritzi Mandes, MD   0.4 mg at 11/10/19 0848  . traZODone (DESYREL) tablet 50-100 mg  50-100 mg Oral QHS Fritzi Mandes, MD   50 mg at 11/09/19 2202  . vitamin B-12 (CYANOCOBALAMIN) tablet 1,000 mcg  1,000 mcg Oral Daily Fritzi Mandes, MD   1,000 mcg at 11/10/19 0847     Objective: Vital signs in last 24 hours: Temp:  [98 F (36.7 C)-99.1 F (37.3 C)] 98.5 F (36.9 C) (08/27 0344) Pulse Rate:  [41-77] 74 (08/27 0344) Resp:  [13-27] 20 (08/27 0344) BP: (90-201)/(32-118) 151/42 (08/27 0344) SpO2:  [91 %-100 %] 95 % (08/27 0344) Weight:  [68 kg-74.8 kg] 74.8 kg (08/26 1310)  Intake/Output from previous day: 08/26 0701 - 08/27 0700 In: -  Out: 5631 [Urine:3740] Intake/Output this shift: No intake/output data recorded.  Physical Exam Vitals and nursing note reviewed.  Constitutional:      General: She is not in acute distress.    Appearance: She is not ill-appearing, toxic-appearing or diaphoretic.  HENT:     Head: Normocephalic and atraumatic.  Pulmonary:     Effort: Pulmonary effort is normal. No respiratory distress.  Skin:    General: Skin is warm and dry.  Neurological:     Mental Status: She is alert and oriented to person, place, and time.  Psychiatric:        Mood and Affect: Mood normal.        Behavior: Behavior normal.    Lab Results:  Recent Labs    11/09/19 1129  WBC 7.6  HGB 9.1*  HCT 27.2*  PLT 616*   BMET Recent Labs    11/09/19 1129 11/09/19 1129 11/09/19 1754 11/10/19 0535  NA 121*  --   --  133*  K 6.4*   < > 4.8 5.2*  CL 90*  --   --  97*  CO2 23  --   --  27  GLUCOSE 122*  --   --  98  BUN 51*  --   --  42*  CREATININE 1.67*  --   --  1.32*  CALCIUM 8.9  --   --  10.0   < > = values in this  interval not displayed.   Assessment & Plan: 74 year old female with a history of distal left ureteral tumor with associated chronic left hydronephrosis admitted for management of weakness and shortness of breath with AKI and hyperkalemia.  Creatinine improving today.  She denies pain.  No immediate urologic intervention indicated.  I again reviewed her treatment options for her left hydronephrosis if her kidney function worsens, including left ureteral stent replacement versus left nephrostomy tube.  Patient reiterated that she is not interested in left ureteral stent placement due to poorly tolerated stent in the past.  She may be interested in PCN if necessary.  Recommendations: -Foley per primary team, ok to discontinue from urologic perspective -Continue to trend daily renal function -Continue to hold Eliquis in case of need to place left nephrostomy tube -If renal function remains stable, may resume Eliquis at discharge with plans for close urology follow-up at [facility], PA-C 11/10/2019

## 2019-11-10 NOTE — Progress Notes (Signed)
OT Cancellation Note  Patient Details Name: Pamela James MRN: [id number] DOB: 01/01/1946   Cancelled Treatment:    Reason Eval/Treat Not Completed: Other (comment). Consult received, chart reviewed. Pt with staff for pt care. Will re-attempt OT Evaluation next date as medically appropriate and as pt is available.  Jeni Salles, MPH, MS, OTR/L ascom [phone] 11/10/19, 4:30 PM

## 2019-11-10 NOTE — ED Notes (Signed)
Foley emptied, 1460 mL yellow urine. Pt states she is comfortable and resting well.

## 2019-11-10 NOTE — ED Notes (Signed)
Pt coughing and c/o SHOB, pt offered nebulizer.

## 2019-11-10 NOTE — Plan of Care (Signed)
Continuing with plan of care. 

## 2019-11-10 NOTE — Progress Notes (Signed)
Initial Nutrition Assessment  DOCUMENTATION CODES:   Not applicable  INTERVENTION:   Ensure Enlive po BID, each supplement provides 350 kcal and 20 grams of protein  NUTRITION DIAGNOSIS:   Increased nutrient needs related to chronic illness (COPD) as evidenced by increased estimated needs.  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 Pt reports good appetite and oral intake at baseline. Pt reports that she has been eating well at home plus drinking several Ensure throughout the day. Per chart review, pt's son reported that pt has poor oral intake at home and mainly just drinks Ensure. Pt's lunch tray was on her side table today with 25% eaten from it. Pt reports that she had to stop eating because they came to do an echo on her and then her food was cold. Pt reports that she is hungry and does plan to eat dinner. RD will add supplements to help pt meet her estimated needs. Per chart, pt with weight gain pta; pt up ~20lbs from her UBW.   Medications reviewed and include: colace, lasix, heparin, protonix, veltassa, B12  Labs reviewed: Na 133(L), K 5.2(H), BUN 42(H), creat 1.32(H) Hgb 9.1(L), Hct 27.2(L)  NUTRITION - FOCUSED PHYSICAL EXAM:    Most Recent Value  Orbital Region No depletion  Upper Arm Region No depletion  Thoracic and Lumbar Region No depletion  Buccal Region No depletion  Temple Region No depletion  Clavicle Bone Region No depletion  Clavicle and  Acromion Bone Region No depletion  Scapular Bone Region No depletion  Dorsal Hand No depletion  Patellar Region No depletion  Anterior Thigh Region No depletion  Posterior Calf Region No depletion  Edema (RD Assessment) None  Hair Reviewed  Eyes Reviewed  Mouth Reviewed  Skin Reviewed  Nails Reviewed     Diet Order:   Diet Order            Diet renal with fluid restriction Fluid restriction: 1200 mL Fluid; Room service appropriate? Yes; Fluid consistency: Thin  Diet effective now                EDUCATION NEEDS:   Education needs have been addressed  Skin:  Skin Assessment: Reviewed RN Assessment  Last BM:  8/25  Height:   Ht Readings from Last 1 Encounters:  11/09/19 5' 3"  (1.6 m)    Weight:   Wt Readings from Last 1 Encounters:  11/09/19 74.8 kg    Ideal Body Weight:  52.3 kg  BMI:  Body mass index is 29.19 kg/m.  Estimated Nutritional Needs:   Kcal:  1500-1700kcal/day  Protein:  75-85g/day  Fluid:  1.3-1.6L/day  Koleen Distance MS, RD, LDN Please refer to [facility] for RD and/or RD on-call/weekend/after hours pager

## 2019-11-10 NOTE — Progress Notes (Signed)
*  PRELIMINARY RESULTS* Echocardiogram 2D Echocardiogram has been performed.  Pamela James 11/10/2019, 12:54 PM

## 2019-11-10 NOTE — Consult Note (Signed)
ANTICOAGULATION CONSULT NOTE  Pharmacy Consult for heparin Indication: VTE prophylaxis given recent sublclavian clot  Patient Measurements: Height: 5\' 3"  (160 cm) Weight: 74.8 kg (164 lb 12.8 oz) IBW/kg (Calculated) : 52.4 Heparin Dosing Weight: 66.3 kg  Vital Signs: Temp: 99.4 F (37.4 C) (08/27 1137) Temp Source: Oral (08/27 1137) BP: 154/40 (08/27 1137) Pulse Rate: 78 (08/27 1137)  Labs: Recent Labs    11/09/19 1129 11/09/19 1305 11/10/19 0535  HGB 9.1*  --   --   HCT 27.2*  --   --   PLT 616*  --   --   CREATININE 1.67*  --  1.32*  TROPONINIHS 18* 18*  --     Estimated Creatinine Clearance: 35.7 mL/min (A) (by C-G formula based on SCr of 1.32 mg/dL (H)).   Medical History: Past Medical History:  Diagnosis Date  . Anxiety   . Asthma   . COPD (chronic obstructive pulmonary disease) ([place])   . Depression   . GERD (gastroesophageal reflux disease)   . Hypertension     Medications:  Scheduled:  . amLODipine  10 mg Oral Daily  . buPROPion  150 mg Oral BID  . Chlorhexidine Gluconate Cloth  6 each Topical Daily  . docusate sodium  100 mg Oral BID  . feeding supplement (ENSURE ENLIVE)  237 mL Oral BID BM  . furosemide  20 mg Intravenous Daily  . gabapentin  100 mg Oral BID  . heparin  4,000 Units Intravenous Once  . hydrALAZINE  25 mg Oral BID  . pantoprazole  40 mg Oral Daily  . patiromer  16.8 g Oral Daily  . pravastatin  20 mg Oral QHS  . tamsulosin  0.4 mg Oral Daily  . traZODone  50-100 mg Oral QHS  . umeclidinium-vilanterol  1 puff Inhalation Daily  . vitamin B-12  1,000 mcg Oral Daily    Assessment: 74 y.o. 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  She has a recent h/o sublavian clot for which she was prescribed apixaban (last known dose PTA). She was recently admitted to [facility] on a heparin infusion and data from that admission was used to inform the starting heparin infusion rate below. apixaban is being held pending a possible left nephrostomy tube  Goal of Therapy:  Heparin level 0.3-0.7 units/ml aPTT 66 - 102s Monitor platelets by anticoagulation protocol: Yes   Plan:   Give 4000 units bolus x 1  Start heparin infusion at 1150 units/hr  Check anti-Xa and aPTT level in 8 hours and daily while on heparin  aPTT will be used to guide heparin infusion rate until anti-Xa and aPTT correlate  Continue to monitor H&H and platelets  Dallie Piles 11/10/2019,2:05 PM

## 2019-11-10 NOTE — Evaluation (Signed)
Physical Therapy Evaluation Patient Details Name: Pamela James MRN: [id number] DOB: 1945-08-30 Today's Date: 11/10/2019   History of Present Illness  Ms. Holton is a 74 y/o female who was admitted this acute stay for acute hypoxic respiratory failure. Her chief complaint includes increasing SOB for the last couple of days. PMH inlcudes COPD/asthma, not on home O2, GERD, HTN, anxiety, depression, bipolar disorder, L hydronephrosis s/p uteroscopy June 29th with stent placement, removed 7/21 at clinic in [place].  Clinical Impression  Pt received lying in bed on 4L O2 via nasal cannula and agreeable to PT evaluation. Pt reported slight feeling of SOB at rest therefore monitored closely throughout session. Pt O2 sats within 90s prior to activity. Pt perfomred bed mobility with mod I requiring increased time to sit EOB. Pt attempted to stand prior to clinician's readiness and pt reported dizziness with attempt which subsided after sitting rest break within 1 minute. Pt performed sit to stand using RW with CGA and ambulated 2 feet from bed to recliner chair with CGA for steadying and able to sit with SBA. O2 sats in mid 90s and RR between 18-22. Pt with SOB and labored breathing with activity therefore deferred further ambulation attempts at this time. Pt performed open chain therex seated in recliner chair and reported overall improvement in respiration quality. Pt left sitting up in recliner chair with all needs met. O2 sats at 98%, HR 85 on 4L O2. Pt with deficits in strength, balance, ambulation, and endurance. Recommend skilled PT to address current deficits and HHPT at discharge to optimize return to PLOF and maximize functional mobility.    Follow Up Recommendations Home health PT    Equipment Recommendations  None recommended by PT;Other (comment) (pt already owns needed equipment)    Recommendations for Other Services       Precautions / Restrictions Precautions Precautions:  Fall Restrictions Weight Bearing Restrictions: No      Mobility  Bed Mobility Overal bed mobility: Modified Independent             General bed mobility comments: required no physical assistance but required increased time to perform  Transfers Overall transfer level: Needs assistance Equipment used: Rolling walker (2 wheeled) Transfers: Sit to/from Stand Sit to Stand: Min guard         General transfer comment: CGA for sit to stand for steadying and SBA for stand to sit to recliner chair; pt reached back for armrests to assist with controlled lowering to chair  Ambulation/Gait Ambulation/Gait assistance: Min guard Gait Distance (Feet): 2 Feet Assistive device: Rolling walker (2 wheeled)   Gait velocity: decreased   General Gait Details: Pt ambulated 2 feet from bed to chair using RW and CGA for steadying. Further ambulation distance deferred due to feeling SOB.  Stairs            Wheelchair Mobility    Modified Rankin (Stroke Patients Only)       Balance Overall balance assessment: Needs assistance Sitting-balance support: Feet supported Sitting balance-Leahy Scale: Good Sitting balance - Comments: no overt LOB while sitting with unsupported back at edge of bed   Standing balance support: Bilateral upper extremity supported Standing balance-Leahy Scale: Good Standing balance comment: CGA for steadying with BUE on RW                             Pertinent Vitals/Pain Pain Assessment: No/denies pain    Home Living Family/patient expects to be  discharged to:: Private residence Living Arrangements: Children Available Help at Discharge: Family Type of Home: House Home Access: Stairs to enter Entrance Stairs-Rails: Psychiatric nurse of Steps: [place]: One level Home Equipment: Environmental consultant - 2 wheels;Cane - single point;Shower seat;Grab bars - tub/shower;Grab bars - toilet      Prior Function Level of Independence:  Independent         Comments: Pt states that she was independent with ambulation without AD however reports that she required assistance to stand from a low surface; pt denies fall history     Hand Dominance        Extremity/Trunk Assessment   Upper Extremity Assessment Upper Extremity Assessment: Overall WFL for tasks assessed;Generalized weakness (grossly 4-/5 bilaterally)    Lower Extremity Assessment Lower Extremity Assessment: Overall WFL for tasks assessed;Generalized weakness (grossly 4-/5 bilaterally)       Communication   Communication: No difficulties  Cognition Arousal/Alertness: Awake/alert Behavior During Therapy: WFL for tasks assessed/performed Overall Cognitive Status: Within Functional Limits for tasks assessed                                 General Comments: Pt A&O x 4.      General Comments      Exercises Other Exercises Other Exercises: pt performed seated marching, LAQ, and SLR x 10-15 reps each bilaterally   Assessment/Plan    PT Assessment Patient needs continued PT services  PT Problem List Decreased strength;Decreased activity tolerance;Decreased balance;Decreased mobility;Decreased coordination;Cardiopulmonary status limiting activity       PT Treatment Interventions DME instruction;Gait training;Stair training;Functional mobility training;Therapeutic activities;Therapeutic exercise;Balance training;Patient/family education    PT Goals (Current goals can be found in the Care Plan section)  Acute Rehab PT Goals Patient Stated Goal: to go home PT Goal Formulation: With patient Time For Goal Achievement: 11/24/19 Potential to Achieve Goals: Good Additional Goals Additional Goal #1: Pt will perform sit <> stand and stand pivot transfers from bed/chair levels with no more than mod I and LRAD (if needed). (to maximize functional mobility)    Frequency Min 2X/week   Barriers to discharge        Co-evaluation                AM-PAC PT "6 Clicks" Mobility  Outcome Measure Help needed turning from your back to your side while in a flat bed without using bedrails?: None Help needed moving from lying on your back to sitting on the side of a flat bed without using bedrails?: A Little Help needed moving to and from a bed to a chair (including a wheelchair)?: A Little Help needed standing up from a chair using your arms (e.g., wheelchair or bedside chair)?: A Little Help needed to walk in hospital room?: A Little Help needed climbing 3-5 steps with a railing? : A Little 6 Click Score: 19    End of Session Equipment Utilized During Treatment: Gait belt;Oxygen (4L O2 via nasal cannula) Activity Tolerance: Patient limited by fatigue Patient left: in chair;with call bell/phone within reach;with chair alarm set Nurse Communication: Mobility status PT Visit Diagnosis: Unsteadiness on feet (R26.81);Muscle weakness (generalized) (M62.81)    Time: 7253-6644 PT Time Calculation (min) (ACUTE ONLY): 32 min   Charges:             Vale Haven, SPT  Vale Haven 11/10/2019, 12:36 PM

## 2019-11-11 DIAGNOSIS — R0902 Hypoxemia: Secondary | ICD-10-CM

## 2019-11-11 DIAGNOSIS — Z9189 Other specified personal risk factors, not elsewhere classified: Secondary | ICD-10-CM

## 2019-11-11 DIAGNOSIS — N139 Obstructive and reflux uropathy, unspecified: Secondary | ICD-10-CM

## 2019-11-11 DIAGNOSIS — J810 Acute pulmonary edema: Secondary | ICD-10-CM

## 2019-11-11 DIAGNOSIS — J432 Centrilobular emphysema: Secondary | ICD-10-CM

## 2019-11-11 LAB — FOLATE: Folate: 54 ng/mL (ref >5.9)

## 2019-11-11 LAB — FERRITIN: Ferritin: 17 ng/mL (ref 11–307)

## 2019-11-11 LAB — RETICULOCYTES
Immature Retic Fract: 33.9 % — ABNORMAL HIGH (ref 2.3–15.9)
RBC.: 2.77 MIL/uL — ABNORMAL LOW (ref 3.87–5.11)
Retic Count, Absolute: 63.7 10*3/uL (ref 19.0–186.0)
Retic Ct Pct: 2.3 % (ref 0.4–3.1)

## 2019-11-11 LAB — CBC
HCT: 25.4 % — ABNORMAL LOW (ref 36.0–46.0)
Hemoglobin: 8.8 g/dL — ABNORMAL LOW (ref 12.0–15.0)
MCH: 31.2 pg (ref 26.0–34.0)
MCHC: 34.6 g/dL (ref 30.0–36.0)
MCV: 90.1 fL (ref 80.0–100.0)
Platelets: 565 10*3/uL — ABNORMAL HIGH (ref 150–400)
RBC: 2.82 MIL/uL — ABNORMAL LOW (ref 3.87–5.11)
RDW: 15.6 % — ABNORMAL HIGH (ref 11.5–15.5)
WBC: 6.8 10*3/uL (ref 4.0–10.5)
nRBC: 0 % (ref 0.0–0.2)

## 2019-11-11 LAB — RENAL FUNCTION PANEL
Albumin: 3.7 g/dL (ref 3.5–5.0)
Anion gap: 9 (ref 5–15)
BUN: 33 mg/dL — ABNORMAL HIGH (ref 8–23)
CO2: 27 mmol/L (ref 22–32)
Calcium: 9.7 mg/dL (ref 8.9–10.3)
Chloride: 98 mmol/L (ref 98–111)
Creatinine, Ser: 1.2 mg/dL — ABNORMAL HIGH (ref 0.44–1.00)
GFR calc Af Amer: 51 mL/min — ABNORMAL LOW (ref >60)
GFR calc non Af Amer: 44 mL/min — ABNORMAL LOW (ref >60)
Glucose, Bld: 99 mg/dL (ref 70–99)
Phosphorus: 3.8 mg/dL (ref 2.5–4.6)
Potassium: 4.6 mmol/L (ref 3.5–5.1)
Sodium: 134 mmol/L — ABNORMAL LOW (ref 135–145)

## 2019-11-11 LAB — VITAMIN B12: Vitamin B-12: 514 pg/mL (ref 180–914)

## 2019-11-11 LAB — HEPARIN LEVEL (UNFRACTIONATED): Heparin Unfractionated: 1.01 IU/mL — ABNORMAL HIGH (ref 0.30–0.70)

## 2019-11-11 LAB — IRON AND TIBC
Iron: 21 ug/dL — ABNORMAL LOW (ref 28–170)
Saturation Ratios: 5 % — ABNORMAL LOW (ref 10.4–31.8)
TIBC: 435 ug/dL (ref 250–450)
UIBC: 414 ug/dL

## 2019-11-11 LAB — APTT
aPTT: 119 seconds — ABNORMAL HIGH (ref 24–36)
aPTT: 39 seconds — ABNORMAL HIGH (ref 24–36)

## 2019-11-11 LAB — MAGNESIUM: Magnesium: 2 mg/dL (ref 1.7–2.4)

## 2019-11-11 MED ORDER — SODIUM CHLORIDE 0.9 % IV SOLN
510.0000 mg | Freq: Once | INTRAVENOUS | Status: DC
  Filled 2019-11-11: qty 17

## 2019-11-11 MED ORDER — SODIUM CHLORIDE 0.9 % IV SOLN
510.0000 mg | Freq: Once | INTRAVENOUS | Status: AC
  Administered 2019-11-11: 510 mg via INTRAVENOUS
  Filled 2019-11-11: qty 17

## 2019-11-11 MED ORDER — SENNOSIDES-DOCUSATE SODIUM 8.6-50 MG PO TABS: 1.0000 | ORAL_TABLET | Freq: Two times a day (BID) | ORAL | 0 refills | Status: DC | PRN

## 2019-11-11 MED ORDER — FERROUS SULFATE 325 (65 FE) MG PO TBEC: 325.0000 mg | DELAYED_RELEASE_TABLET | Freq: Two times a day (BID) | ORAL | 1 refills | Status: DC

## 2019-11-11 MED ORDER — FUROSEMIDE 20 MG PO TABS: 20.0000 mg | ORAL_TABLET | Freq: Every day | ORAL | 1 refills | Status: DC

## 2019-11-11 MED ORDER — APIXABAN 5 MG PO TABS
5.0000 mg | ORAL_TABLET | Freq: Two times a day (BID) | ORAL | Status: DC
  Administered 2019-11-11: 5 mg via ORAL
  Filled 2019-11-11: qty 1

## 2019-11-11 MED ORDER — LISINOPRIL 20 MG PO TABS
40.0000 mg | ORAL_TABLET | Freq: Every day | ORAL | 1 refills | Status: DC
Start: 2019-11-13 — End: 2019-12-07

## 2019-11-11 MED ORDER — METOPROLOL TARTRATE 25 MG PO TABS
12.5000 mg | ORAL_TABLET | Freq: Two times a day (BID) | ORAL | 1 refills | Status: DC
Start: 2019-11-11 — End: 2021-04-22

## 2019-11-11 MED ORDER — AMLODIPINE BESYLATE 5 MG PO TABS: 5.0000 mg | ORAL_TABLET | Freq: Every day | ORAL | Status: DC

## 2019-11-11 MED ORDER — INCRUSE ELLIPTA 62.5 MCG/INH IN AEPB: 1.0000 | INHALATION_SPRAY | Freq: Every day | RESPIRATORY_TRACT | 1 refills | Status: DC

## 2019-11-11 MED ORDER — IPRATROPIUM-ALBUTEROL 20-100 MCG/ACT IN AERS: 1.0000 | INHALATION_SPRAY | Freq: Four times a day (QID) | RESPIRATORY_TRACT | 1 refills | Status: DC | PRN

## 2019-11-11 MED ORDER — HEPARIN (PORCINE) 25000 UT/250ML-% IV SOLN
1100.0000 [IU]/h | INTRAVENOUS | Status: DC
  Administered 2019-11-11 (×2): 900 [IU]/h via INTRAVENOUS
  Filled 2019-11-11: qty 250

## 2019-11-11 NOTE — Consult Note (Signed)
ANTICOAGULATION CONSULT NOTE  Pharmacy Consult for heparin Indication: VTE prophylaxis given recent sublclavian clot  Patient Measurements: Height: 5\' 3"  (160 cm) Weight: 74.8 kg (164 lb 12.8 oz) IBW/kg (Calculated) : 52.4 Heparin Dosing Weight: 66.3 kg  Vital Signs: Temp: 97.7 F (36.5 C) (08/28 1149) Temp Source: Oral (08/28 0447) BP: 128/65 (08/28 1149) Pulse Rate: 72 (08/28 1149)  Labs: Recent Labs    11/09/19 1129 11/09/19 1305 11/10/19 0535 11/11/19 0056 11/11/19 1213  HGB 9.1*  --   --  8.8*  --   HCT 27.2*  --   --  25.4*  --   PLT 616*  --   --  565*  --   APTT  --   --   --  119* 39*  HEPARINUNFRC  --   --   --  1.01*  --   CREATININE 1.67*  --  1.32* 1.20*  --   TROPONINIHS 18* 18*  --   --   --     Estimated Creatinine Clearance: 39.3 mL/min (A) (by C-G formula based on SCr of 1.2 mg/dL (H)).   Medical History: Past Medical History:  Diagnosis Date  . Anxiety   . Asthma   . COPD (chronic obstructive pulmonary disease) ([place])   . Depression   . GERD (gastroesophageal reflux disease)   . Hypertension     Medications:  Scheduled:  . amLODipine  5 mg Oral Daily  . buPROPion  150 mg Oral BID  . Chlorhexidine Gluconate Cloth  6 each Topical Daily  . docusate sodium  100 mg Oral BID  . feeding supplement (ENSURE ENLIVE)  237 mL Oral BID BM  . furosemide  20 mg Intravenous Daily  . gabapentin  100 mg Oral BID  . hydrALAZINE  25 mg Oral BID  . pantoprazole  40 mg Oral Daily  . patiromer  16.8 g Oral Daily  . pravastatin  20 mg Oral QHS  . tamsulosin  0.4 mg Oral Daily  . traZODone  50-100 mg Oral QHS  . umeclidinium-vilanterol  1 puff Inhalation Daily  . vitamin B-12  1,000 mcg Oral Daily    Assessment: 74 y.o. female with a hx of hypertension, hyperkalemia, prediabetes, hyperlipidemia, vitamin D deficiency, primary hyperparathyroidism, history of colon polyps, left ureteral tumor, current smoker, COPD not on home oxygen, thrombocytosis, MGUS  seen by hematology, and recent hospitalization with aspiration pna, intubation, and cardiac arrest requiring CPR. She has a recent h/o sublavian clot for which she was prescribed apixaban (last known dose PTA). She was recently admitted to [facility] on a heparin infusion and data from that admission was used to inform the starting heparin infusion rate below. apixaban is being held pending a possible left nephrostomy tube  8/28 0056 HL 1.01 aPTT 119  8/28 1213 HL aPTT 39   Goal of Therapy:  Heparin level 0.3-0.7 units/ml aPTT 66 - 102s Monitor platelets by anticoagulation protocol: Yes   Plan:  APTT is subtherapeutic. Will increase heparin infusion to 1100 units/hrs. Reorder aPTT in 6 hours. Heparin level and CBC with AM labs. Switch to heparin level once aPTT and Heparin level correlate.     Eleonore Chiquito, PharmD, BCPS Clinical Pharmacist 11/11/2019,1:05 PM

## 2019-11-11 NOTE — Consult Note (Signed)
ANTICOAGULATION CONSULT NOTE  Pharmacy Consult for heparin Indication: VTE prophylaxis given recent sublclavian clot  Patient Measurements: Height: 5\' 3"  (160 cm) Weight: 74.8 kg (164 lb 12.8 oz) IBW/kg (Calculated) : 52.4 Heparin Dosing Weight: 66.3 kg  Vital Signs: Temp: 98.6 F (37 C) (08/27 2041) Temp Source: Oral (08/27 2041) BP: 122/69 (08/27 2041) Pulse Rate: 69 (08/27 2041)  Labs: Recent Labs    11/09/19 1129 11/09/19 1305 11/10/19 0535 11/11/19 0056  HGB 9.1*  --   --  8.8*  HCT 27.2*  --   --  25.4*  PLT 616*  --   --  565*  APTT  --   --   --  119*  HEPARINUNFRC  --   --   --  1.01*  CREATININE 1.67*  --  1.32* 1.20*  TROPONINIHS 18* 18*  --   --     Estimated Creatinine Clearance: 39.3 mL/min (A) (by C-G formula based on SCr of 1.2 mg/dL (H)).   Medical History: Past Medical History:  Diagnosis Date  . Anxiety   . Asthma   . COPD (chronic obstructive pulmonary disease) ([place])   . Depression   . GERD (gastroesophageal reflux disease)   . Hypertension     Medications:  Scheduled:  . amLODipine  10 mg Oral Daily  . buPROPion  150 mg Oral BID  . Chlorhexidine Gluconate Cloth  6 each Topical Daily  . docusate sodium  100 mg Oral BID  . feeding supplement (ENSURE ENLIVE)  237 mL Oral BID BM  . furosemide  20 mg Intravenous Daily  . gabapentin  100 mg Oral BID  . hydrALAZINE  25 mg Oral BID  . pantoprazole  40 mg Oral Daily  . patiromer  16.8 g Oral Daily  . pravastatin  20 mg Oral QHS  . tamsulosin  0.4 mg Oral Daily  . traZODone  50-100 mg Oral QHS  . umeclidinium-vilanterol  1 puff Inhalation Daily  . vitamin B-12  1,000 mcg Oral Daily    Assessment: 74 y.o. 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 She has a recent h/o sublavian clot for which she was prescribed apixaban (last known dose PTA). She was recently admitted to [facility] on a heparin infusion and data from that admission was used to inform the starting heparin infusion rate below. apixaban is being held pending a possible left nephrostomy tube  Goal of Therapy:  Heparin level 0.3-0.7 units/ml aPTT 66 - 102s Monitor platelets by anticoagulation protocol: Yes   Plan:  08/28 @ 0056 HL 1.01, aPTT 119 seconds both supratherapeutic and non correlative. Will hold heparin drip for an hour then restart at a rate of 900 units/hr and will recheck aPTT at 1200, CBC trending down will continue to monitor.  Tobie Lords, PharmD, BCPS Clinical Pharmacist 11/11/2019,3:16 AM

## 2019-11-11 NOTE — Progress Notes (Signed)
Patient ambulated on room air and oxygen saturation maintained at 97%.

## 2019-11-11 NOTE — Discharge Summary (Signed)
Physician Discharge Summary  Pamela James MVE:[id number] DOB: Jul 20, 1945 DOA: 11/09/2019  PCP: Gladstone Lighter, MD  Admit date: 11/09/2019 Discharge date: 11/11/2019  Admitted From: Home Disposition: Home  Recommendations for Outpatient Follow-up:  1. Follow ups as below. 2. Please obtain CBC/BMP/Mag at follow up 3. Please follow up on the following pending results: None  Home Health: PT/OT/RN Equipment/Devices: None  Discharge Condition: Stable CODE STATUS: Full code   Follow-up Information    [facility] Follow up on 11/23/2019.   Specialty: Cardiology Why: at 1:30pm. Enter through the [place] entrance Contact information: [place] [phone]       Gladstone Lighter, MD Follow up.   Specialty: Internal Medicine Why: Please schedule hospital follow up appt within 5-7 days of discharge. Contact information: [place] [address] [phone]        Home, [facility] Follow up.   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  In ED, bradycardic to 40s.  RR in mid 20s.  Desaturated to 87% on RA at rest and put on 2 L by nasal cannula.  Hgb 9.1.  Platelets 616. Na 121.  K6.4. Cr 1.67.  BUN 51.  BNP 860.  High-sensitivity troponin 18.  EKG junctional rhythm at the rate of  53/min.  CXR with interstitial edema concerning for CHF.  Lactic acid negative.  COVID-19 PCR negative.  Urinalysis without significant finding.  CT abdomen and pelvis with new moderately severe left hydroureteronephrosis and small bilateral pleural effusions.  Cardiology consulted.  Urology consulted and recommended IR for nephrostomy if no improvement in renal function and electrolytes.  Nephrology consulted as well.  Patient received atropine, calcium gluconate, dextrose, insulin, IV Lasix, Veltassa, and admitted for acute respiratory failure with hypoxia due to CHF exacerbation, AKI in the setting of left hydronephrosis, hyponatremia and hyperkalemia.  Patient was started on IV Lasix. Respiratory status and AKI improved. Hyperkalemia and hyponatremia resolved. Cleared for discharge by urology, nephrology and cardiology.  On the day of discharge, she was ambulated on room air and maintained saturation above 97%. Evaluated by therapy who recommended home health PT/OT.  See individual problem list below for more on hospital course.    Discharge Diagnoses:  Acute respiratory failure with hypoxia-likely due to fluid overload/CHF and patient with underlying COPD.  Not on oxygen at home. Resolved. Ambulated on room air and maintained saturation above 97%. -Manage CHF and COPD as below  Acute on chronic diastolic CHF:  Equal with EF of 60 to 65% and no significant structural or functional abnormalities. Appears euvolemic but elevated BNP and chest x-ray concerning for interstitial edema on admission. Breathing and renal function improved after IV Lasix. She had a net -7 L.  -Discharge on p.o. Lasix 20 mg daily -reduced home lisinopril to 20 mg daily -reassess fluid status and renal function at follow-up  Bradycardia  with junctional rhythm:  HR in 40s on arrival. Likely due to electrolyte abnormalities and metoprolol. Repeat EKG NSR.  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  Stents removed in July due to significant pain.  Per urology, patient refused stent out of concern for pain.   -Baseline Cr 0.8> 1.67 (admit)> 1.32> 1.2. Cleared for discharge by urology and nephrology. -decreased home lisinopril -Continue home flomax -outpatient follow-up with his urologist -recheck renal function in 1 week   Hyponatremia-likely hypervolemic in the setting of renal failure and CHF.  Na 121 (admit)> 134.   Hyperkalemia: K6.4 (admit)>> 5.2> 4.6.  Received calcium gluconate, dextrose, insulin and Veltassa  Essential hypertension: Normotensive. -Cardiac meds as below. Reduced home lisinopril to give room for p.o. 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 -Continue home Eliquis  Iron deficiency anemia: Hgb 9.1 (baseline)> 8.8. -Received IV Feraheme -p.o. ferrous sulfate with bowel regimen  Anxiety/depression/bipolar disorder: Stable -Continue home medications  Chronic COPD: Not on oxygen at home.  Uses Advair and Combivent at home.  She has upper airway sound on exam. -Continue home Symbicort and as needed Respimat -added Incruse Ellipta  Noncompliance with medications -Counseled.  Tobacco use disorder -Encouraged cessation  Thrombocytosis: Chronic.  Unclear etiology. -May need outpatient hematology evaluation if it persists.  Debility/generalized weakness/physical deconditioning -Home health PT/OT  At risk for polypharmacy-patient son concerned about the way she takes her medications -Home health RN  Poor p.o. intake:   Body mass index is 29.19 kg/m. Nutrition Problem: Increased nutrient needs Etiology: chronic illness (COPD) Signs/Symptoms: estimated needs Interventions: Ensure Enlive  (each supplement provides 350kcal and 20 grams of protein), MVI      Discharge Exam: Vitals:   11/11/19 0447 11/11/19 1149  BP: 102/61 128/65  Pulse: 74 72  Resp: 18 16  Temp: 98.6 F (37 C) 97.7 F (36.5 C)  SpO2: 95% 92%    GENERAL: No apparent distress.  Nontoxic. HEENT: MMM.  Vision and hearing grossly intact.  NECK: Supple.  No apparent JVD.  RESP: 97% on RA. No IWOB. Upper airway sounds transmitted throughout. CVS:  RRR. Heart sounds normal.  ABD/GI/GU: Bowel sounds present. Soft. Non tender.  MSK/EXT:  Moves extremities. No apparent deformity. No edema.  SKIN: no apparent skin lesion or wound NEURO: Awake, alert and oriented appropriately.  No apparent focal neuro deficit. PSYCH: Calm. Normal affect.  Discharge Instructions  Discharge Instructions    (HEART FAILURE PATIENTS) Call MD:  Anytime you have any of the following symptoms: 1) 3 pound weight gain in 24 hours or 5 pounds in 1 week 2) shortness of breath, with or without a dry hacking cough 3) swelling in the hands, feet or stomach 4) if you have to sleep on extra pillows at night in order to breathe.   Complete by: As directed    Call MD for:  difficulty breathing, headache or visual disturbances   Complete by: As directed    Call MD for:  extreme fatigue   Complete by: As directed    Call MD for:  persistant dizziness or light-headedness   Complete by: As directed    Call MD for:  persistant nausea and vomiting   Complete by: As directed    Call MD for:  temperature >  100.4   Complete by: As directed    Diet - low sodium heart healthy   Complete by: As directed    Discharge instructions   Complete by: As directed    It has been a pleasure taking care of you!  You were hospitalized with shortness of breath likely due to heart failure exacerbation.  You were treated with furosemide (fluid medication).  With that, your symptoms improved to the point we think it is safe to let you go home and follow-up with  your primary care doctor and cardiologist.  We also recommend follow-up with your urologist and nephrologist.   We may have started you on other new medications or made some changes to your home medications during this hospitalization. Please review your new medication list and the directions carefully before you take them.    Please go to your hospital follow-up appointments or call to schedule as recommended.   Take care,   Increase activity slowly   Complete by: As directed      Allergies as of 11/11/2019      Reactions   Morphine And Related Other (See Comments)   Delerium, itching   Nsaids Other (See Comments)   GI ulcers   Aspirin Other (See Comments)   GI ulcers Can tolerate 81 mg daily      Medication List    STOP taking these medications   diphenhydrAMINE 25 mg capsule Commonly known as: BENADRYL     TAKE these medications   albuterol (2.5 MG/3ML) 0.083% nebulizer solution Commonly known as: PROVENTIL Take 2.5 mg by nebulization every 6 (six) hours as needed for wheezing or shortness of breath. What changed: Another medication with the same name was removed. Continue taking this medication, and follow the directions you see here.   alendronate 70 MG tablet Commonly known as: FOSAMAX Take 70 mg by mouth once a week. Take with a full glass of water on an empty stomach.   amLODipine 10 MG tablet Commonly known as: NORVASC Take 10 mg by mouth daily.   apixaban 5 MG Tabs tablet Commonly known as: ELIQUIS Take 1 tablet (5 mg total) by mouth 2 (two) times daily.   aspirin 81 MG EC tablet Take 1 tablet (81 mg total) by mouth daily.   buPROPion 150 MG 12 hr tablet Commonly known as: WELLBUTRIN SR Take 150 mg by mouth 2 (two) times daily.   feeding supplement (ENSURE ENLIVE) Liqd Take 237 mLs by mouth daily.   ferrous sulfate 325 (65 FE) MG EC tablet Take 1 tablet (325 mg total) by mouth 2 (two) times daily.   fluticasone-salmeterol 230-21 MCG/ACT  inhaler Commonly known as: ADVAIR HFA Inhale 2 puffs into the lungs 2 (two) times daily.   furosemide 20 MG tablet Commonly known as: Lasix Take 1 tablet (20 mg total) by mouth daily. Start taking on: November 13, 2019   gabapentin 300 MG capsule Commonly known as: NEURONTIN Take 300 mg by mouth 2 (two) times daily.   hydrALAZINE 25 MG tablet Commonly known as: APRESOLINE Take 25 mg by mouth in the morning and at bedtime.   Incruse Ellipta 62.5 MCG/INH Aepb Generic drug: umeclidinium bromide Inhale 1 puff into the lungs daily.   Ipratropium-Albuterol 20-100 MCG/ACT Aers respimat Commonly known as: COMBIVENT Inhale 1 puff into the lungs 4 (four) times daily as needed for wheezing or shortness of breath. What changed:   when to take this  reasons to take this   lidocaine 5 % Commonly  known as: [place] 1 patch onto the skin daily. Remove & Discard patch within 12 hours or as directed by MD   lisinopril 20 MG tablet Commonly known as: ZESTRIL Take 2 tablets (40 mg total) by mouth daily. Start taking on: November 13, 2019 What changed:   medication strength  These instructions start on November 13, 2019. If you are unsure what to do until then, ask your doctor or other care provider.   metoprolol tartrate 25 MG tablet Commonly known as: LOPRESSOR Take 0.5 tablets (12.5 mg total) by mouth 2 (two) times daily. What changed: how much to take   nitroGLYCERIN 0.4 MG SL tablet Commonly known as: NITROSTAT Place 1 tablet (0.4 mg total) under the tongue every 5 (five) minutes as needed for chest pain.   omeprazole 20 MG capsule Commonly known as: PRILOSEC Take 20 mg by mouth daily.   ondansetron 4 MG disintegrating tablet Commonly known as: ZOFRAN-ODT Take 4 mg by mouth every 8 (eight) hours as needed for nausea or vomiting.   pravastatin 20 MG tablet Commonly known as: PRAVACHOL Take 20 mg by mouth at bedtime.   senna-docusate 8.6-50 MG tablet Commonly known as:  Senokot-S Take 1 tablet by mouth 2 (two) times daily between meals as needed for mild constipation.   tamsulosin 0.4 MG Caps capsule Commonly known as: FLOMAX Take 0.4 mg by mouth daily.   traZODone 100 MG tablet Commonly known as: DESYREL Take 50-100 mg by mouth at bedtime.   vitamin B-12 1000 MCG tablet Commonly known as: CYANOCOBALAMIN Take 1,000 mcg by mouth daily.       Consultations:  Cardiology  Nephrology  urology  Procedures/Studies:  2D Echo on 11/10/2019 1. Left ventricular ejection fraction, by estimation, is 60 to 65%. The  left ventricle has normal function. The left ventricle has no regional  wall motion abnormalities. There is mild left ventricular hypertrophy.  Left ventricular diastolic parameters  are indeterminate.  2. Right ventricular systolic function is normal. The right ventricular  size is normal.  3. The mitral valve is normal in structure. No evidence of mitral valve  regurgitation. No evidence of mitral stenosis.   DG Chest 2 View  Result Date: 11/09/2019 CLINICAL DATA:  Short of breath 3 days. COPD. Hypertension. Smoker. EXAM: CHEST - 2 VIEW COMPARISON:  09/24/2019 FINDINGS: Heart size upper normal.  Heavily calcified aortic arch. Increased interstitial lung markings diffusely specially right lung base. Possible interstitial edema. Small bilateral pleural effusions. Interval improvement in aeration lung bases where there previously was bibasilar atelectasis and effusion. IMPRESSION: Interval development of interstitial edema pattern likely due to fluid overload. Atypical infection also possible. Small bilateral effusions. Electronically Signed   By: Franchot Gallo M.D.   On: 11/09/2019 12:00   CT Renal Stone Study  Result Date: 11/09/2019 CLINICAL DATA:  Weakness and shortness of breath for 3 weeks. 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 EXAM: CT ABDOMEN AND PELVIS WITHOUT CONTRAST TECHNIQUE: Multidetector CT imaging  of the abdomen and pelvis was performed following the standard protocol without IV contrast. COMPARISON:  CT abdomen and pelvis 09/21/2019. FINDINGS: Lower chest: The patient has small bilateral pleural effusions. Bibasilar airspace disease present on the prior study has markedly improved. Heart size is normal. Hepatobiliary: No focal liver abnormality is seen. No gallstones, gallbladder wall thickening, or biliary dilatation. Pancreas: Unremarkable. No pancreatic ductal dilatation or surrounding inflammatory changes. Spleen: Normal in size without focal abnormality. Adrenals/Urinary Tract: The left adrenal gland is enlarged  at 2.3 x 1.7 cm. Hounsfield unit measurements are 23.6. The prior CT scan was contrast infused enhanced and the left adrenal had Hounsfield unit measurements of 147.5. 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 Left ureteral stent present on the prior CT has been removed. There is moderately severe left hydronephrosis. The left ureter is dilated and has material within it measuring 39 Hounsfield units in the mid ureter. Similar attenuating material is seen in the left ureter at the UVJ. Evaluation of the distal ureters somewhat limited due to streak artifact from the patient's left hip replacement. Right renal scarring is again seen. 2-3 nonobstructing stones in the right kidney are seen. The largest measures 0.5 cm. Stomach/Bowel: Stomach is within normal limits. Small hiatal hernia is noted. The appendix is not seen but no evidence of appendicitis is present. No evidence of bowel wall thickening, distention, or inflammatory changes. Vascular/Lymphatic: Aortic atherosclerosis. No enlarged abdominal or pelvic lymph nodes. Reproductive: Status post hysterectomy. No adnexal masses. Other: There is a small volume of free pelvic fluid. Small fat containing umbilical hernia noted. Musculoskeletal: No acute or focal abnormality. Left hip arthroplasty noted. IMPRESSION: Left renal  stent seen on the prior CT has been removed. The patient has new moderately severe left hydroureteronephrosis. Increased attenuation within the mid and distal left ureter is likely due to hemorrhage but tumor within the ureter is also possible given the patient's history. Small bilateral pleural effusions. Bibasilar airspace disease seen on the prior CT is improved. Right renal scarring with 3 nonobstructing stones, unchanged. Small volume of pelvic ascites. Aortic Atherosclerosis (ICD10-I70.0). Electronically Signed   By: Inge Rise M.D.   On: 11/09/2019 14:23   ECHOCARDIOGRAM LIMITED  Result Date: 11/10/2019    ECHOCARDIOGRAM LIMITED REPORT   Patient Name:   Pamela James Date of Exam: 11/10/2019 Medical Rec #:  [id number]  Height:       63.0 in Accession #:    [id number] Weight:       164.8 lb Date of Birth:  1945/07/22   BSA:          1.781 m Patient Age:    74 years   BP:           154/90 mmHg Patient Gender: F          HR:           73 bpm. Exam Location:  [facility] Procedure: Limited Echo, Cardiac Doppler and Color Doppler Indications:     Abnormal ECG 794.31  History:         Patient has prior history of Echocardiogram examinations, most                  recent 09/16/2019. COPD; Risk Factors:Hypertension.  Sonographer:     Sherrie Sport RDCS (AE) Referring Phys:  [id number] Arvil Chaco Diagnosing Phys: Ida Rogue MD IMPRESSIONS  1. Left ventricular ejection fraction, by estimation, is 60 to 65%. The left ventricle has normal function. The left ventricle has no regional wall motion abnormalities. There is mild left ventricular hypertrophy. Left ventricular diastolic parameters are indeterminate.  2. Right ventricular systolic function is normal. The right ventricular size is normal.  3. The mitral valve is normal in structure. No evidence of mitral valve regurgitation. No evidence of mitral stenosis. FINDINGS  Left Ventricle: Left ventricular ejection fraction, by estimation, is 60 to 65%. The left  ventricle has normal function. The left ventricle has no regional wall motion abnormalities. The left ventricular internal  cavity size was normal in size. There is  mild left ventricular hypertrophy. Right Ventricle: The right ventricular size is normal. No increase in right ventricular wall thickness. Right ventricular systolic function is normal. Left Atrium: Left atrial size was normal in size. Right Atrium: Right atrial size was normal in size. Pericardium: There is no evidence of pericardial effusion. Mitral Valve: The mitral valve is normal in structure. Normal mobility of the mitral valve leaflets. No evidence of mitral valve stenosis. Tricuspid Valve: The tricuspid valve is normal in structure. Tricuspid valve regurgitation is not demonstrated. No evidence of tricuspid stenosis. Aortic Valve: The aortic valve was not well visualized. Aortic valve regurgitation is not visualized. No aortic stenosis is present. Pulmonic Valve: The pulmonic valve was normal in structure. Pulmonic valve regurgitation is not visualized. No evidence of pulmonic stenosis. Aorta: The aortic root is normal in size and structure. Venous: The inferior vena cava is normal in size with greater than 50% respiratory variability, suggesting right atrial pressure of 3 mmHg. IAS/Shunts: No atrial level shunt detected by color flow Doppler. LEFT VENTRICLE PLAX 2D LVIDd:         3.77 cm LVIDs:         2.19 cm LV PW:         1.37 cm LV IVS:        1.21 cm LVOT diam:     2.10 cm LVOT Area:     3.46 cm  RIGHT VENTRICLE RV Basal diam:  2.94 cm LEFT ATRIUM             Index       RIGHT ATRIUM           Index LA diam:        2.70 cm 1.52 cm/m  RA Area:     11.70 cm LA Vol (A2C):   49.0 ml 27.51 ml/m RA Volume:   26.60 ml  14.94 ml/m LA Vol (A4C):   41.0 ml 23.02 ml/m LA Biplane Vol: 46.0 ml 25.83 ml/m                        PULMONIC VALVE AORTA                 PV Vmax:        1.58 m/s Ao Root diam: 2.80 cm PV Peak grad:   10.0 mmHg                        RVOT Peak grad: 9 mmHg   SHUNTS Systemic Diam: 2.10 cm Ida Rogue MD Electronically signed by Ida Rogue MD Signature Date/Time: 11/10/2019/6:20:37 PM    Final        The results of significant diagnostics from this hospitalization (including imaging, microbiology, ancillary and laboratory) are listed below for reference.     Microbiology: Recent Results (from the past 240 hour(s))  SARS Coronavirus 2 by RT PCR (hospital order, performed in [facility] hospital lab) Nasopharyngeal     Status: None   Collection Time: 11/09/19  3:08 PM   Specimen: Nasopharyngeal  Result Value Ref Range Status   SARS Coronavirus 2 NEGATIVE NEGATIVE Final    Comment: (NOTE) SARS-CoV-2 target nucleic acids are NOT DETECTED.  The SARS-CoV-2 RNA is generally detectable in upper and lower respiratory specimens during the acute phase of infection. The lowest concentration of SARS-CoV-2 viral copies this assay can detect is 250 copies / mL. A negative  result does not preclude SARS-CoV-2 infection and should not be used as the sole basis for treatment or other patient management decisions.  A negative result may occur with improper specimen collection / handling, submission of specimen other than nasopharyngeal swab, presence of viral mutation(s) within the areas targeted by this assay, and inadequate number of viral copies (<250 copies / mL). A negative result must be combined with clinical observations, patient history, and epidemiological information.  Fact Sheet for Patients:   StrictlyIdeas.no  Fact Sheet for Healthcare Providers: BankingDealers.co.za  This test is not yet approved or  cleared by the [place] FDA and has been authorized for detection and/or diagnosis of SARS-CoV-2 by FDA under an Emergency Use Authorization (EUA).  This EUA will remain in effect (meaning this test can be used) for the duration of the COVID-19  declaration under Section 564(b)(1) of the Act, 21 U.S.C. section 360bbb-3(b)(1), unless the authorization is terminated or revoked sooner.  Performed at [facility], [address] [place], [place] [address]      Labs: BNP (last 3 results) Recent Labs    09/15/19 1513 09/15/19 1725 11/09/19 1129  BNP 872.0* 789.6* 573.2*   Basic Metabolic Panel: Recent Labs  Lab 11/09/19 1129 11/09/19 1754 11/10/19 0535 11/11/19 0056  NA 121*  --  133* 134*  K 6.4* 4.8 5.2* 4.6  CL 90*  --  97* 98  CO2 23  --  27 27  GLUCOSE 122*  --  98 99  BUN 51*  --  42* 33*  CREATININE 1.67*  --  1.32* 1.20*  CALCIUM 8.9  --  10.0 9.7  MG  --   --   --  2.0  PHOS  --   --   --  3.8   Liver Function Tests: Recent Labs  Lab 11/09/19 1129 11/11/19 0056  AST 26  --   ALT 31  --   ALKPHOS 87  --   BILITOT 0.9  --   PROT 6.6  --   ALBUMIN 3.8 3.7   No results for input(s): LIPASE, AMYLASE in the last 168 hours. No results for input(s): AMMONIA in the last 168 hours. CBC: Recent Labs  Lab 11/09/19 1129 11/11/19 0056  WBC 7.6 6.8  HGB 9.1* 8.8*  HCT 27.2* 25.4*  MCV 92.2 90.1  PLT 616* 565*   Cardiac Enzymes: No results for input(s): CKTOTAL, CKMB, CKMBINDEX, TROPONINI in the last 168 hours. BNP: Invalid input(s): POCBNP CBG: No results for input(s): GLUCAP in the last 168 hours. D-Dimer No results for input(s): DDIMER in the last 72 hours. Hgb A1c No results for input(s): HGBA1C in the last 72 hours. Lipid Profile No results for input(s): CHOL, HDL, LDLCALC, TRIG, CHOLHDL, LDLDIRECT in the last 72 hours. Thyroid function studies No results for input(s): TSH, T4TOTAL, T3FREE, THYROIDAB in the last 72 hours.  Invalid input(s): FREET3 Anemia work up Recent Labs    11/11/19 0056  VITAMINB12 514  FOLATE 54.0  FERRITIN 17  TIBC 435  IRON 21*  RETICCTPCT 2.3   Urinalysis    Component Value Date/Time   COLORURINE YELLOW (A) 11/09/2019 1434   APPEARANCEUR  CLEAR (A) 11/09/2019 1434   LABSPEC 1.006 11/09/2019 1434   PHURINE 5.0 11/09/2019 1434   GLUCOSEU NEGATIVE 11/09/2019 1434   [place] 11/09/2019 [place] 11/09/2019 1434   [place] 11/09/2019 1434   PROTEINUR NEGATIVE 11/09/2019 1434   NITRITE NEGATIVE 11/09/2019 1434   LEUKOCYTESUR NEGATIVE  11/09/2019 1434   Sepsis Labs Invalid input(s): PROCALCITONIN,  WBC,  LACTICIDVEN   Time coordinating discharge: 45 minutes  SIGNED:  Mercy Riding, MD  [facility] 11/11/2019, 4:28 PM  If 7PM-7AM, please contact night-coverage [email or web address]

## 2019-11-11 NOTE — Evaluation (Signed)
Occupational Therapy Evaluation Patient Details Name: Pamela James MRN: [id number] DOB: 1946/03/08 Today's Date: 11/11/2019    History of Present Illness Pamela James is a 74 y/o female who was admitted this acute stay for acute hypoxic respiratory failure. Her chief complaint includes increasing SOB for the last couple of days. PMH inlcudes COPD/asthma, not on home O2, GERD, HTN, anxiety, depression, bipolar disorder, L hydronephrosis s/p uteroscopy June 29th with stent placement, removed 7/21 at clinic in [place].   Clinical Impression   Patient is a 74 yo female who lives at home with her son, admitted for acute hypoxic respiratory failure, denies O2 use at home but is on 4 L this date.  OT evaluation completed, pt presents with muscle weakness, decreased balance, and decreased ability to perform self care and IADL tasks. Pt would benefit from skilled OT services to maximize safety and independence in necessary daily tasks.  Recommend HHOT at discharge.    Follow Up Recommendations  Home health OT    Equipment Recommendations       Recommendations for Other Services       Precautions / Restrictions Precautions Precautions: Fall Restrictions Weight Bearing Restrictions: No      Mobility Bed Mobility Overal bed mobility: Modified Independent             General bed mobility comments: required no physical assistance but required increased time to perform  Transfers Overall transfer level: Needs assistance Equipment used: Rolling walker (2 wheeled) Transfers: Sit to/from Stand Sit to Stand: Min guard         General transfer comment: Min guard for transfers from bed and toileting this date.    Balance Overall balance assessment: Needs assistance Sitting-balance support: Feet supported Sitting balance-Leahy Scale: Good     Standing balance support: Bilateral upper extremity supported Standing balance-Leahy Scale: Good Standing balance comment: min guard                            ADL either performed or assessed with clinical judgement   ADL Overall ADL's : Needs assistance/impaired Eating/Feeding: Modified independent   Grooming: Min guard   Upper Body Bathing: Set up   Lower Body Bathing: Set up   Upper Body Dressing : Modified independent   Lower Body Dressing: Min guard   Toilet Transfer: Min guard   Toileting- Clothing Manipulation and Hygiene: Min guard       Functional mobility during ADLs: Min guard;Rolling walker General ADL Comments: Pt reports no O2 at home, on 4L here.  She enjoys watching TV, reading, puzzles and number painting.  Reports she has not been out of the house much in the last 1- 2 years due to pandemic.     Vision Baseline Vision/History: Wears glasses Wears Glasses: Reading only       Perception     Praxis      Pertinent Vitals/Pain Pain Assessment: No/denies pain     Hand Dominance Right   Extremity/Trunk Assessment Upper Extremity Assessment Upper Extremity Assessment: Generalized weakness   Lower Extremity Assessment Lower Extremity Assessment: Defer to PT evaluation       Communication Communication Communication: No difficulties   Cognition Arousal/Alertness: Awake/alert Behavior During Therapy: WFL for tasks assessed/performed Overall Cognitive Status: Within Functional Limits for tasks assessed  General Comments: Pt reports decreased memory for details especially for names.   General Comments       Exercises     Shoulder Instructions      Home Living Family/patient expects to be discharged to:: Private residence Living Arrangements: Children Available Help at Discharge: Family Type of Home: House Home Access: Stairs to enter Technical brewer of Steps: 3 Entrance Stairs-Rails: [place]: One level     Bathroom Shower/Tub: [place] unit;Curtain   Bathroom Toilet: Handicapped height     Home  Equipment: Environmental consultant - 2 wheels;Cane - single point;Shower seat;Grab bars - tub/shower;Grab bars - toilet          Prior Functioning/Environment Level of Independence: Independent        Comments: Patient reports she was independent with all self care tasks, daughter in law helps with meal prep and organizing medication        OT Problem List: Decreased strength;Decreased activity tolerance;Decreased cognition;Decreased knowledge of use of DME or AE;Impaired balance (sitting and/or standing)      OT Treatment/Interventions: Self-care/ADL training;Energy conservation;Balance training;Cognitive remediation/compensation;Therapeutic exercise;DME and/or AE instruction;Therapeutic activities;Patient/family education    OT Goals(Current goals can be found in the care plan section) Acute Rehab OT Goals Patient Stated Goal: to be able to do as much for herself and go home with family OT Goal Formulation: With patient Time For Goal Achievement: 11/25/19 Potential to Achieve Goals: Good ADL Goals Pt Will Perform Lower Body Dressing: (P) with modified independence Pt Will Transfer to Toilet: (P) with modified independence  OT Frequency: Min 1X/week   Barriers to D/C:            Co-evaluation              AM-PAC OT "6 Clicks" Daily Activity     Outcome Measure Help from another person eating meals?: None Help from another person taking care of personal grooming?: None Help from another person toileting, which includes using toliet, bedpan, or urinal?: A Little Help from another person bathing (including washing, rinsing, drying)?: A Little Help from another person to put on and taking off regular upper body clothing?: None Help from another person to put on and taking off regular lower body clothing?: A Little 6 Click Score: 21   End of Session Equipment Utilized During Treatment: Gait belt;Rolling walker  Activity Tolerance:   Patient left: in chair;with call bell/phone within  reach;with chair alarm set  OT Visit Diagnosis: Muscle weakness (generalized) (M62.81)                Time: 5427-0623 OT Time Calculation (min): 32 min Charges:  OT General Charges $OT Visit: 1 Visit OT Evaluation $OT Eval Low Complexity: 1 Low OT Treatments $Self Care/Home Management : 8-22 mins  Ludger Bones T Kwana Ringel, OTR/L, CLT   Saabir Blyth 11/11/2019, 11:59 AM

## 2019-11-11 NOTE — Plan of Care (Signed)
Discharge teaching completed with patient and son, Bekka Qian, patient is in stable condition.

## 2019-11-11 NOTE — Progress Notes (Signed)
Patient is at 2L oxygen this am with oxygen saturation at 95%.  Patient transferred from bedside commode to bed on room air and oxygen saturation went down to 87% and came back up to 95% once placed back on 2L of oxygen.

## 2019-11-11 NOTE — Progress Notes (Signed)
Pamela James  MRN: [id number]  DOB/AGE: May 11, 1945 74 y.o.  Primary Care Physician:Kalisetti, Hart [place], MD  Admit date: 11/09/2019  Chief Complaint:  Chief Complaint  Patient presents with  . Shortness of Breath  . Weakness    S-Pt presented on  11/09/2019 with  Chief Complaint  Patient presents with  . Shortness of Breath  . Weakness  . Patient offers no new complaint.  Patient main comment was" I am  feeling  better than before"  Medication . amLODipine  5 mg Oral Daily  . buPROPion  150 mg Oral BID  . Chlorhexidine Gluconate Cloth  6 each Topical Daily  . docusate sodium  100 mg Oral BID  . feeding supplement (ENSURE ENLIVE)  237 mL Oral BID BM  . furosemide  20 mg Intravenous Daily  . gabapentin  100 mg Oral BID  . hydrALAZINE  25 mg Oral BID  . pantoprazole  40 mg Oral Daily  . patiromer  16.8 g Oral Daily  . pravastatin  20 mg Oral QHS  . tamsulosin  0.4 mg Oral Daily  . traZODone  50-100 mg Oral QHS  . umeclidinium-vilanterol  1 puff Inhalation Daily  . vitamin B-12  1,000 mcg Oral Daily         MHD:QQIWL from the symptoms mentioned above,there are no other symptoms referable to all systems reviewed.  Physical Exam: Vital signs in last 24 hours: Temp:  [98.6 F (37 C)-99.4 F (37.4 C)] 98.6 F (37 C) (08/28 0447) Pulse Rate:  [69-78] 74 (08/28 0447) Resp:  [18-21] 18 (08/28 0447) BP: (102-154)/(40-69) 102/61 (08/28 0447) SpO2:  [94 %-95 %] 95 % (08/28 0447) Weight change:  Last BM Date: 11/08/19  Intake/Output from previous day: 08/27 0701 - 08/28 0700 In: 613.7 [P.O.:480; I.V.:133.7] Out: 3850 [Urine:3850] No intake/output data recorded.   Physical Exam: General- pt is awake,alert, oriented to time place and person Resp- No acute REsp distress, CTA B/L NO Rhonchi CVS- S1S2 regular in rate and rhythm GIT- BS+, soft, NT, ND EXT- NO LE Edema, Cyanosis   Lab Results: CBC Recent Labs    11/09/19 1129 11/11/19 0056  WBC 7.6 6.8  HGB 9.1*  8.8*  HCT 27.2* 25.4*  PLT 616* 565*    BMET Recent Labs    11/10/19 0535 11/11/19 0056  NA 133* 134*  K 5.2* 4.6  CL 97* 98  CO2 27 27  GLUCOSE 98 99  BUN 42* 33*  CREATININE 1.32* 1.20*  CALCIUM 10.0 9.7    Creatinine trend 2021  1.6==>1.2 0.8 baseline July admission  0.8---2.6  MICRO Recent Results (from the past 240 hour(s))  SARS Coronavirus 2 by RT PCR (hospital order, performed in [place] hospital lab) Nasopharyngeal     Status: None   Collection Time: 11/09/19  3:08 PM   Specimen: Nasopharyngeal  Result Value Ref Range Status   SARS Coronavirus 2 NEGATIVE NEGATIVE Final    Comment: (NOTE) SARS-CoV-2 target nucleic acids are NOT DETECTED.  The SARS-CoV-2 RNA is generally detectable in upper and lower respiratory specimens during the acute phase of infection. The lowest concentration of SARS-CoV-2 viral copies this assay can detect is 250 copies / mL. A negative result does not preclude SARS-CoV-2 infection and should not be used as the sole basis for treatment or other patient management decisions.  A negative result may occur with improper specimen collection / handling, submission of specimen other than nasopharyngeal swab, presence of viral mutation(s) within the areas targeted by this  assay, and inadequate number of viral copies (<250 copies / mL). A negative result must be combined with clinical observations, patient history, and epidemiological information.  Fact Sheet for Patients:   StrictlyIdeas.no  Fact Sheet for Healthcare Providers: BankingDealers.co.za  This test is not yet approved or  cleared by the [place] FDA and has been authorized for detection and/or diagnosis of SARS-CoV-2 by FDA under an Emergency Use Authorization (EUA).  This EUA will remain in effect (meaning this test can be used) for the duration of the COVID-19 declaration under Section 564(b)(1) of the Act, 21  U.S.C. section 360bbb-3(b)(1), unless the authorization is terminated or revoked sooner.  Performed at [facility], [address] [place], [place] [address]       Lab Results  Component Value Date   CALCIUM 9.7 11/11/2019   PHOS 3.8 11/11/2019               Impression:    Pt is a 74 y.o. 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  1)Renal  AKI secondary to obstruction Patient was earlier admitted in July with a peak creatinine of 2.6 Patient had stent placement and patient AKI recovered Patient is now coming again with another episode of AKI Patient creatinine is trending down  2)HTN Blood pressure is at goal   3)Anemia of iron deficiency   results for Pamela James, Pamela James (MRN [id number]) as of 11/11/2019 09:45  Ref. Range 11/11/2019 00:56  Iron Latest Ref Range: 28 - 170 ug/dL 21 (L)  UIBC Latest Units: ug/dL 414  TIBC Latest Ref Range: 250 - 450 ug/dL 435  Saturation Ratios Latest Ref Range: 10.4 - 31.8 % 5 (L)  Ferritin Latest Ref Range: 11 - 307 ng/mL 17   Will benefit from IV iron  4) hyperkalemia Now better  5) hyponatremia Patient sodium level is now better   6) acute on chronic diastolic CHF Patient is improving   7)Acid base Co2 at goal     Plan:   We will give patient IV iron.  We will continue to follow    Cristine Daw s Theador Hawthorne 11/11/2019, 9:46 AM

## 2019-11-11 NOTE — Progress Notes (Addendum)
   Subjective Afebrile overnight  Left flank pain at baseline Voiding spontaneously  Physical Exam: BP 102/61 (BP Location: Left Arm)   Pulse 74   Temp 98.6 F (37 C) (Oral)   Resp 18   Ht 5\' 3"  (1.6 m)   Wt 74.8 kg   SpO2 95%   BMI 29.19 kg/m    Constitutional:  Alert and oriented, No acute distress. GI: Abdomen is soft, non-tender, non-distended   Laboratory Data: Reviewed Potassium 4.6 Creatinine 1.2(1.32)(1.67)  Assessment & Plan:   In summary she is a comorbid 74 year old female with a complex urologic history who has been followed at [facility]  She has a left distal ureteral tumor(low-grade urothelial cell carcinoma) with resulting upstream hydronephrosis and her left ureteral stent was removed on 10/04/2019 at [facility].  A follow-up renal ultrasound 1 week later on 8/2 showed recurrence of her left severe hydronephrosis and renal function has remained stable with a creatinine of 1.0 over the last 3 weeks.  She is currently admitted with weakness and shortness of breath.  Renal function continues to improve with creatinine today of 1.2 from 1.67 on admission.  Urinalysis is completely benign and she has no clinical or laboratory evidence of infection.  No urgent indication for left ureteral stent replacement or nephrostomy tube.  Additionally, the patient refuses replacement of the left ureteral stent secondary to severe pain with the stent in place.  I had a long discussion with her son Venora Maples on the phone this morning about her urologic issues, and the need for close follow-up with her primary urologist at [facility].  Okay for resumption of oral anticoagulation from a urology perspective, do not anticipate need for nephrostomy tube placement Follow-up with primary urologist at [facility] after discharge for management of left urothelial cell carcinoma and hydronephrosis  A total of 30 minutes were spent on the floor with greater than 50% spent with the patient and her son and counseling and  coordination of care regarding left hydronephrosis and left distal ureteral tumor.  Billey Co, MD

## 2019-11-11 NOTE — TOC Progression Note (Signed)
Transition of Care [facility]) - Progression Note    Patient Details  Name: Pamela James MRN: [id number] Date of Birth: [birth year]  Transition of Care [facility]) CM/SW [place], [place] Phone Number: 11/11/2019, 4:04 PM  Clinical Narrative:     CSW spoke with patients son, Venora Maples, regarding discharge planning. PT currently recommending HH- per [place] CM, [facility] has agreed to accept. Son voiced concerns with patients care throughout her patient and requested to speak with MD. CSW requested MD to reach to son for further communication.    Expected Discharge Plan: [place] Services Barriers to Discharge: Other (comment) (Son thinks patient is not medically stable)  Expected Discharge Plan and Services Expected Discharge Plan: [place] In-house Referral: Clinical Social Work Discharge Planning Services: CM Consult Post Acute Care Choice: [place] arrangements for the past 2 months: [place] Expected Discharge Date: 11/11/19                         HH Arranged: OT, PT, RN [place] Agency: [facility] (now [facility]) Date [place]: 11/11/19 Time [place]: 1516 Representative spoke with at [place]: [place] ([place]) Interventions    Readmission Risk Interventions Readmission Risk Prevention Plan 11/10/2019  Transportation Screening Complete  PCP or Specialist Appt within 3-5 Days Complete  Social Work Consult for [place] Planning/Counseling [place] Not Applicable  Medication Review Press photographer) Complete  Some recent data might be hidden

## 2019-11-11 NOTE — Plan of Care (Signed)
Continuing with plan of care. 

## 2019-11-11 NOTE — Progress Notes (Signed)
Progress Note  Patient Name: Pamela James Date of Encounter: 11/11/2019  [facility] Cardiologist: New to [facility]  Subjective   Reports that she feels well, no complaints She reports good urine output Nurses report from overnight into this morning saturations into the nineties at rest on 2 L oxygen down to 87% on ambulation on room air --On further discussion she reports that she has inhalers at home, sometimes uses her nebulizer Reports having less wheezing this morning -7.2 L this hospital stay  Inpatient Medications    Scheduled Meds: . amLODipine  5 mg Oral Daily  . buPROPion  150 mg Oral BID  . Chlorhexidine Gluconate Cloth  6 each Topical Daily  . docusate sodium  100 mg Oral BID  . feeding supplement (ENSURE ENLIVE)  237 mL Oral BID BM  . furosemide  20 mg Intravenous Daily  . gabapentin  100 mg Oral BID  . hydrALAZINE  25 mg Oral BID  . pantoprazole  40 mg Oral Daily  . patiromer  16.8 g Oral Daily  . pravastatin  20 mg Oral QHS  . tamsulosin  0.4 mg Oral Daily  . traZODone  50-100 mg Oral QHS  . umeclidinium-vilanterol  1 puff Inhalation Daily  . vitamin B-12  1,000 mcg Oral Daily   Continuous Infusions: . heparin 900 Units/hr (11/11/19 1200)   PRN Meds: acetaminophen **OR** acetaminophen, albuterol, ipratropium-albuterol, nitroGLYCERIN, ondansetron **OR** ondansetron (ZOFRAN) IV   Vital Signs    Vitals:   11/10/19 1137 11/10/19 2041 11/11/19 0447 11/11/19 1149  BP: (!) 154/40 122/69 102/61 128/65  Pulse: 78 69 74 72  Resp:  (!) 21 18 16   Temp: 99.4 F (37.4 C) 98.6 F (37 C) 98.6 F (37 C) 97.7 F (36.5 C)  TempSrc: Oral Oral Oral   SpO2: 95% 94% 95% 92%  Weight:      Height:        Intake/Output Summary (Last 24 hours) at 11/11/2019 1241 Last data filed at 11/11/2019 1200 Gross per 24 hour  Intake 680.77 ml  Output 1625 ml  Net -944.23 ml   Last 3 Weights 11/09/2019 11/09/2019 10/10/2019  Weight (lbs) 164 lb 12.8 oz 150 lb 135 lb    Weight (kg) 74.753 kg 68.04 kg 61.236 kg      Telemetry    Normal sinus rhythm- Personally Reviewed  ECG     - Personally Reviewed  Physical Exam   GEN: No acute distress.   Neck: No JVD Cardiac: RRR, no murmurs, rubs, or gallops.  Respiratory: Clear to auscultation bilaterally.  Scattered wheeze GI: Soft, nontender, non-distended  MS: No edema; No deformity. Neuro:  Nonfocal  Psych: Normal affect   Labs    High Sensitivity Troponin:   Recent Labs  Lab 11/09/19 1129 11/09/19 1305  TROPONINIHS 18* 18*      Chemistry Recent Labs  Lab 11/09/19 1129 11/09/19 1129 11/09/19 1754 11/10/19 0535 11/11/19 0056  NA 121*  --   --  133* 134*  K 6.4*   < > 4.8 5.2* 4.6  CL 90*  --   --  97* 98  CO2 23  --   --  27 27  GLUCOSE 122*  --   --  98 99  BUN 51*  --   --  42* 33*  CREATININE 1.67*  --   --  1.32* 1.20*  CALCIUM 8.9  --   --  10.0 9.7  PROT 6.6  --   --   --   --  ALBUMIN 3.8  --   --   --  3.7  AST 26  --   --   --   --   ALT 31  --   --   --   --   ALKPHOS 87  --   --   --   --   BILITOT 0.9  --   --   --   --   GFRNONAA 30*  --   --  39* 44*  GFRAA 34*  --   --  46* 51*  ANIONGAP 8  --   --  9 9   < > = values in this interval not displayed.     Hematology Recent Labs  Lab 11/09/19 1129 11/11/19 0056  WBC 7.6 6.8  RBC 2.95* 2.82*  2.77*  HGB 9.1* 8.8*  HCT 27.2* 25.4*  MCV 92.2 90.1  MCH 30.8 31.2  MCHC 33.5 34.6  RDW 15.3 15.6*  PLT 616* 565*    BNP Recent Labs  Lab 11/09/19 1129  BNP 860.2*     DDimer No results for input(s): DDIMER in the last 168 hours.   Radiology    CT Renal Stone Study  Result Date: 11/09/2019 CLINICAL DATA:  Weakness and shortness of breath for 3 weeks. 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 EXAM: CT ABDOMEN AND PELVIS WITHOUT CONTRAST TECHNIQUE: Multidetector CT imaging of the abdomen and pelvis was performed following the standard protocol without IV contrast. COMPARISON:  CT  abdomen and pelvis 09/21/2019. FINDINGS: Lower chest: The patient has small bilateral pleural effusions. Bibasilar airspace disease present on the prior study has markedly improved. Heart size is normal. Hepatobiliary: No focal liver abnormality is seen. No gallstones, gallbladder wall thickening, or biliary dilatation. Pancreas: Unremarkable. No pancreatic ductal dilatation or surrounding inflammatory changes. Spleen: Normal in size without focal abnormality. Adrenals/Urinary Tract: The left adrenal gland is enlarged at 2.3 x 1.7 cm. Hounsfield unit measurements are 23.6. The prior CT scan was contrast infused enhanced and the left adrenal had Hounsfield unit measurements of 147.5. 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 Left ureteral stent present on the prior CT has been removed. There is moderately severe left hydronephrosis. The left ureter is dilated and has material within it measuring 39 Hounsfield units in the mid ureter. Similar attenuating material is seen in the left ureter at the UVJ. Evaluation of the distal ureters somewhat limited due to streak artifact from the patient's left hip replacement. Right renal scarring is again seen. 2-3 nonobstructing stones in the right kidney are seen. The largest measures 0.5 cm. Stomach/Bowel: Stomach is within normal limits. Small hiatal hernia is noted. The appendix is not seen but no evidence of appendicitis is present. No evidence of bowel wall thickening, distention, or inflammatory changes. Vascular/Lymphatic: Aortic atherosclerosis. No enlarged abdominal or pelvic lymph nodes. Reproductive: Status post hysterectomy. No adnexal masses. Other: There is a small volume of free pelvic fluid. Small fat containing umbilical hernia noted. Musculoskeletal: No acute or focal abnormality. Left hip arthroplasty noted. IMPRESSION: Left renal stent seen on the prior CT has been removed. The patient has new moderately severe left hydroureteronephrosis.  Increased attenuation within the mid and distal left ureter is likely due to hemorrhage but tumor within the ureter is also possible given the patient's history. Small bilateral pleural effusions. Bibasilar airspace disease seen on the prior CT is improved. Right renal scarring with 3 nonobstructing stones, unchanged. Small volume of pelvic ascites. Aortic Atherosclerosis (  ICD10-I70.0). Electronically Signed   By: Inge Rise M.D.   On: 11/09/2019 14:23   ECHOCARDIOGRAM LIMITED  Result Date: 11/10/2019    ECHOCARDIOGRAM LIMITED REPORT   Patient Name:   Pamela James Date of Exam: 11/10/2019 Medical Rec #:  [id number]  Height:       63.0 in Accession #:    [id number] Weight:       164.8 lb Date of Birth:  [birth year]   BSA:          1.781 m Patient Age:    74 years   BP:           154/90 mmHg Patient Gender: F          HR:           73 bpm. Exam Location:  [facility] Procedure: Limited Echo, Cardiac Doppler and Color Doppler Indications:     Abnormal ECG 794.31  History:         Patient has prior history of Echocardiogram examinations, most                  recent 09/16/2019. COPD; Risk Factors:Hypertension.  Sonographer:     Sherrie Sport RDCS (AE) Referring Phys:  [id number] Arvil Chaco Diagnosing Phys: Ida Rogue MD IMPRESSIONS  1. Left ventricular ejection fraction, by estimation, is 60 to 65%. The left ventricle has normal function. The left ventricle has no regional wall motion abnormalities. There is mild left ventricular hypertrophy. Left ventricular diastolic parameters are indeterminate.  2. Right ventricular systolic function is normal. The right ventricular size is normal.  3. The mitral valve is normal in structure. No evidence of mitral valve regurgitation. No evidence of mitral stenosis. FINDINGS  Left Ventricle: Left ventricular ejection fraction, by estimation, is 60 to 65%. The left ventricle has normal function. The left ventricle has no regional wall motion abnormalities. The left ventricular  internal cavity size was normal in size. There is  mild left ventricular hypertrophy. Right Ventricle: The right ventricular size is normal. No increase in right ventricular wall thickness. Right ventricular systolic function is normal. Left Atrium: Left atrial size was normal in size. Right Atrium: Right atrial size was normal in size. Pericardium: There is no evidence of pericardial effusion. Mitral Valve: The mitral valve is normal in structure. Normal mobility of the mitral valve leaflets. No evidence of mitral valve stenosis. Tricuspid Valve: The tricuspid valve is normal in structure. Tricuspid valve regurgitation is not demonstrated. No evidence of tricuspid stenosis. Aortic Valve: The aortic valve was not well visualized. Aortic valve regurgitation is not visualized. No aortic stenosis is present. Pulmonic Valve: The pulmonic valve was normal in structure. Pulmonic valve regurgitation is not visualized. No evidence of pulmonic stenosis. Aorta: The aortic root is normal in size and structure. Venous: The inferior vena cava is normal in size with greater than 50% respiratory variability, suggesting right atrial pressure of 3 mmHg. IAS/Shunts: No atrial level shunt detected by color flow Doppler. LEFT VENTRICLE PLAX 2D LVIDd:         3.77 cm LVIDs:         2.19 cm LV PW:         1.37 cm LV IVS:        1.21 cm LVOT diam:     2.10 cm LVOT Area:     3.46 cm  RIGHT VENTRICLE RV Basal diam:  2.94 cm LEFT ATRIUM             Index  RIGHT ATRIUM           Index LA diam:        2.70 cm 1.52 cm/m  RA Area:     11.70 cm LA Vol (A2C):   49.0 ml 27.51 ml/m RA Volume:   26.60 ml  14.94 ml/m LA Vol (A4C):   41.0 ml 23.02 ml/m LA Biplane Vol: 46.0 ml 25.83 ml/m                        PULMONIC VALVE AORTA                 PV Vmax:        1.58 m/s Ao Root diam: 2.80 cm PV Peak grad:   10.0 mmHg                       RVOT Peak grad: 9 mmHg   SHUNTS Systemic Diam: 2.10 cm Ida Rogue MD Electronically signed by  Ida Rogue MD Signature Date/Time: 11/10/2019/6:20:37 PM    Final     Cardiac Studies  Echocardiogram 1. Left ventricular ejection fraction, by estimation, is 60 to 65%. The  left ventricle has normal function. The left ventricle has no regional  wall motion abnormalities. There is mild left ventricular hypertrophy.  Left ventricular diastolic parameters  are indeterminate.  2. Right ventricular systolic function is normal. The right ventricular  size is normal.  3. The mitral valve is normal in structure. No evidence of mitral valve  regurgitation. No evidence of mitral stenosis.   Patient Profile     Ms. Vanvleck is a 74 year old woman with hypertension, primary hyperparathyroidism, smoker, COPD,MGUS, prior hospitalization for aspiration pneumonia requiring intubation, cardiac arrest requiring CPR, severe left hydronephrosis dating back to March 2021, presenting with left hydronephrosis s/p left ureteroscopy on 09/12/2019 with stent and tumor biopsy., pulmonary edema, fatigue, bradycardia  Assessment & Plan    A/P: Junctional bradycardia Arrhythmia noted on arrival Secondary to electrolyte abnormality, hyponatremia and hyperkalemia After normalization of her electrolytes, arrhythmia has resolved  Moderate to severe left hydronephrosis Followed by urology dating back to March 2021 at [facility] Foley catheter placed on arrival, dramatic improvement in her obstructive uropathy, ATN improving 7 L negative Patient not interested in left ureteral stent placement secondary to pain in the past -Needs close outpatient follow-up with urology for recurrence of her symptoms  Pulmonary edema Secondary to hydronephrosis, obstructive uropathy, Cardiac work-up relatively unrevealing Normal ejection fraction estimated 65% No indication of elevated right heart pressures, No clear indication for Lasix on a regular basis as outpatient, -Prescription for Lasix to be given as needed for any  weight gain  COPD/smoker She has inhalers at home, nebulizers, likely responsible for her hypoxia noted by nursing overnight -Recommend follow-up with pulmonary, may need home oxygen Smoking cessation recommended   Total encounter time more than 25 minutes  Greater than 50% was spent in counseling and coordination of care with the patient  [facility] will sign off.   Medication Recommendations: Lasix daily as needed at home for weight gain 3 pounds Other recommendations (labs, testing, etc): No further testing Follow up as an outpatient: Outpatient cardiology follow-up in 2 weeks.  Close follow-up with urology   For questions or updates, please contact [place] Please consult [email or web address] for contact info under        Signed, Ida Rogue, MD  11/11/2019, 12:41 PM

## 2019-11-17 ENCOUNTER — Emergency Department: Payer: Medicare HMO

## 2019-11-17 ENCOUNTER — Emergency Department
Admission: EM | Admit: 2019-11-17 | Discharge: 2019-11-17 | Disposition: A | Discharge status: Home / Self Care | Payer: Medicare HMO | Attending: Emergency Medicine | Admitting: Emergency Medicine

## 2019-11-17 ENCOUNTER — Other Ambulatory Visit: Payer: Self-pay

## 2019-11-17 DIAGNOSIS — R0602 Shortness of breath: Secondary | ICD-10-CM | POA: Diagnosis not present

## 2019-11-17 DIAGNOSIS — Z7952 Long term (current) use of systemic steroids: Secondary | ICD-10-CM | POA: Insufficient documentation

## 2019-11-17 DIAGNOSIS — D473 Essential (hemorrhagic) thrombocythemia: Secondary | ICD-10-CM | POA: Diagnosis not present

## 2019-11-17 DIAGNOSIS — I110 Hypertensive heart disease with heart failure: Secondary | ICD-10-CM | POA: Diagnosis not present

## 2019-11-17 DIAGNOSIS — E871 Hypo-osmolality and hyponatremia: Secondary | ICD-10-CM | POA: Diagnosis not present

## 2019-11-17 DIAGNOSIS — Z7951 Long term (current) use of inhaled steroids: Secondary | ICD-10-CM | POA: Insufficient documentation

## 2019-11-17 DIAGNOSIS — I5033 Acute on chronic diastolic (congestive) heart failure: Secondary | ICD-10-CM | POA: Diagnosis not present

## 2019-11-17 DIAGNOSIS — D509 Iron deficiency anemia, unspecified: Secondary | ICD-10-CM | POA: Diagnosis not present

## 2019-11-17 DIAGNOSIS — J441 Chronic obstructive pulmonary disease with (acute) exacerbation: Secondary | ICD-10-CM | POA: Insufficient documentation

## 2019-11-17 DIAGNOSIS — I509 Heart failure, unspecified: Secondary | ICD-10-CM | POA: Diagnosis not present

## 2019-11-17 DIAGNOSIS — D472 Monoclonal gammopathy: Secondary | ICD-10-CM | POA: Diagnosis not present

## 2019-11-17 DIAGNOSIS — Z20822 Contact with and (suspected) exposure to covid-19: Secondary | ICD-10-CM | POA: Insufficient documentation

## 2019-11-17 DIAGNOSIS — Z79899 Other long term (current) drug therapy: Secondary | ICD-10-CM | POA: Diagnosis not present

## 2019-11-17 DIAGNOSIS — Z87891 Personal history of nicotine dependence: Secondary | ICD-10-CM | POA: Insufficient documentation

## 2019-11-17 DIAGNOSIS — R42 Dizziness and giddiness: Secondary | ICD-10-CM | POA: Diagnosis not present

## 2019-11-17 DIAGNOSIS — Z7982 Long term (current) use of aspirin: Secondary | ICD-10-CM | POA: Diagnosis not present

## 2019-11-17 DIAGNOSIS — Z96642 Presence of left artificial hip joint: Secondary | ICD-10-CM | POA: Insufficient documentation

## 2019-11-17 DIAGNOSIS — R0689 Other abnormalities of breathing: Secondary | ICD-10-CM | POA: Diagnosis not present

## 2019-11-17 DIAGNOSIS — I10 Essential (primary) hypertension: Secondary | ICD-10-CM | POA: Diagnosis not present

## 2019-11-17 DIAGNOSIS — R531 Weakness: Secondary | ICD-10-CM | POA: Diagnosis not present

## 2019-11-17 DIAGNOSIS — D4959 Neoplasm of unspecified behavior of other genitourinary organ: Secondary | ICD-10-CM | POA: Diagnosis not present

## 2019-11-17 DIAGNOSIS — N1330 Unspecified hydronephrosis: Secondary | ICD-10-CM | POA: Diagnosis not present

## 2019-11-17 DIAGNOSIS — R0989 Other specified symptoms and signs involving the circulatory and respiratory systems: Secondary | ICD-10-CM | POA: Diagnosis not present

## 2019-11-17 LAB — CBC
HCT: 30.1 % — ABNORMAL LOW (ref 36.0–46.0)
Hemoglobin: 9.8 g/dL — ABNORMAL LOW (ref 12.0–15.0)
MCH: 30.9 pg (ref 26.0–34.0)
MCHC: 32.6 g/dL (ref 30.0–36.0)
MCV: 95 fL (ref 80.0–100.0)
Platelets: 618 10*3/uL — ABNORMAL HIGH (ref 150–400)
RBC: 3.17 MIL/uL — ABNORMAL LOW (ref 3.87–5.11)
RDW: 16.6 % — ABNORMAL HIGH (ref 11.5–15.5)
WBC: 7.2 10*3/uL (ref 4.0–10.5)
nRBC: 0 % (ref 0.0–0.2)

## 2019-11-17 LAB — BASIC METABOLIC PANEL
Anion gap: 9 (ref 5–15)
BUN: 24 mg/dL — ABNORMAL HIGH (ref 8–23)
CO2: 27 mmol/L (ref 22–32)
Calcium: 10.1 mg/dL (ref 8.9–10.3)
Chloride: 102 mmol/L (ref 98–111)
Creatinine, Ser: 0.87 mg/dL (ref 0.44–1.00)
GFR calc Af Amer: >60 mL/min (ref >60)
GFR calc non Af Amer: >60 mL/min (ref >60)
Glucose, Bld: 94 mg/dL (ref 70–99)
Potassium: 4.6 mmol/L (ref 3.5–5.1)
Sodium: 138 mmol/L (ref 135–145)

## 2019-11-17 LAB — SARS CORONAVIRUS 2 BY RT PCR (HOSPITAL ORDER, PERFORMED IN ~~LOC~~ HOSPITAL LAB): SARS Coronavirus 2: NEGATIVE

## 2019-11-17 LAB — TROPONIN I (HIGH SENSITIVITY): Troponin I (High Sensitivity): 8 ng/L (ref <18)

## 2019-11-17 MED ORDER — ALBUTEROL SULFATE HFA 108 (90 BASE) MCG/ACT IN AERS: 2.0000 | INHALATION_SPRAY | RESPIRATORY_TRACT | 0 refills | Status: DC | PRN

## 2019-11-17 MED ORDER — DOXYCYCLINE HYCLATE 100 MG PO CAPS: 100.0000 mg | ORAL_CAPSULE | Freq: Two times a day (BID) | ORAL | 0 refills | Status: DC

## 2019-11-17 MED ORDER — PREDNISONE 20 MG PO TABS: 20.0000 mg | ORAL_TABLET | Freq: Every day | ORAL | 0 refills | Status: AC

## 2019-11-17 MED ORDER — IPRATROPIUM-ALBUTEROL 0.5-2.5 (3) MG/3ML IN SOLN
3.0000 mL | Freq: Once | RESPIRATORY_TRACT | Status: AC
  Administered 2019-11-17: 3 mL via RESPIRATORY_TRACT
  Filled 2019-11-17: qty 3

## 2019-11-17 MED ORDER — DOXYCYCLINE HYCLATE 100 MG PO TABS
100.0000 mg | ORAL_TABLET | Freq: Once | ORAL | Status: AC
  Administered 2019-11-17: 100 mg via ORAL
  Filled 2019-11-17: qty 1

## 2019-11-17 NOTE — ED Notes (Signed)
Patient ambulated in room independently x5 minutes while on continuous pulse oximetry; sats maintained 92-95%.

## 2019-11-17 NOTE — ED Triage Notes (Signed)
Pt in via EMS from home with c/o dizziness and weakness while standing. Pt also with cardiac hx, CHF, HTN and irregular HR. Pt with recent hx of MI. 178/60

## 2019-11-17 NOTE — ED Triage Notes (Signed)
Pt comes into the ED via EMS from home with c/o dizziness that worsened today , having SOB with palor upon EMS arrival with systolic pressure 706, CBG 110. 178/60, pt has been having issues with dizziness over the past several weeks. Denies N/V or any pain.

## 2019-11-17 NOTE — ED Provider Notes (Signed)
[facility] Emergency Department Provider Note  ____________________________________________  Time seen: Approximately 8:37 PM  I have reviewed the triage vital signs and the nursing notes.   HISTORY  Chief Complaint Dizziness and Shortness of Breath    HPI Pamela James is a 74 y.o. female with a history of COPD, CHF, hypertension who comes the ED complaining of dizziness with standing and shortness of breath, gradual onset and worsening over the past 3 days.  Waxing and waning.  No alleviating factors.  No chest pain.  Has been compliant with her medications at home.  Reviewed EMR, patient had a recent hospitalization due to CHF and volume overload with hyponatremia and hyperkalemia.  She had acute hypoxic respiratory failure during that time which resolved by the time she was discharged home.      Past Medical History:  Diagnosis Date  . Anxiety   . Asthma   . COPD (chronic obstructive pulmonary disease) ([place])   . Depression   . GERD (gastroesophageal reflux disease)   . Hypertension      Patient Active Problem List   Diagnosis Date Noted  . Obstructive uropathy 11/10/2019  . Hydronephrosis 11/10/2019  . Pulmonary edema 11/10/2019  . Acute hypoxemic respiratory failure ([place]) 11/09/2019  . Acute congestive heart failure ([place])   . AKI (acute kidney injury) ([place])   . Junctional bradycardia   . Hyponatremia   . Hyperkalemia   . Acute on chronic respiratory failure with hypoxia ([place]) 09/15/2019  . COPD exacerbation ([place]) 11/21/2017  . Acute bronchitis 03/26/2016  . Essential hypertension 03/26/2016  . Chest pain 03/25/2016     Past Surgical History:  Procedure Laterality Date  . ABDOMINAL HYSTERECTOMY    . TOTAL HIP ARTHROPLASTY Left   . TUBAL LIGATION       Prior to Admission medications   Medication Sig Start Date End Date Taking? Authorizing Provider  albuterol (PROVENTIL HFA) 108 (90 Base) MCG/ACT inhaler Inhale 2 puffs into the  lungs every 4 (four) hours as needed for wheezing or shortness of breath. 11/17/19   Carrie Mew, MD  albuterol (PROVENTIL) (2.5 MG/3ML) 0.083% nebulizer solution Take 2.5 mg by nebulization every 6 (six) hours as needed for wheezing or shortness of breath.    [provider]  alendronate (FOSAMAX) 70 MG tablet Take 70 mg by mouth once a week. Take with a full glass of water on an empty stomach.    [provider]  amLODipine (NORVASC) 10 MG tablet Take 10 mg by mouth daily.    [provider]  apixaban (ELIQUIS) 5 MG TABS tablet Take 1 tablet (5 mg total) by mouth 2 (two) times daily. 09/29/19   Ezekiel Slocumb, DO  aspirin EC 81 MG EC tablet Take 1 tablet (81 mg total) by mouth daily. 03/27/16   Hillary Bow, MD  buPROPion (WELLBUTRIN SR) 150 MG 12 hr tablet Take 150 mg by mouth 2 (two) times daily. 11/06/19   [provider]  doxycycline (VIBRAMYCIN) 100 MG capsule Take 1 capsule (100 mg total) by mouth 2 (two) times daily for 7 days. 11/17/19 11/24/19  Carrie Mew, MD  feeding supplement, ENSURE ENLIVE, (ENSURE ENLIVE) LIQD Take 237 mLs by mouth daily. 09/27/19   Nicole Kindred A, DO  ferrous sulfate 325 (65 FE) MG EC tablet Take 1 tablet (325 mg total) by mouth 2 (two) times daily. 11/11/19 05/09/20  Mercy Riding, MD  fluticasone-salmeterol (ADVAIR HFA) 230-21 MCG/ACT inhaler Inhale 2 puffs into the lungs  2 (two) times daily.    [provider]  furosemide (LASIX) 20 MG tablet Take 1 tablet (20 mg total) by mouth daily. 11/13/19 05/11/20  Mercy Riding, MD  gabapentin (NEURONTIN) 300 MG capsule Take 300 mg by mouth 2 (two) times daily.     [provider]  hydrALAZINE (APRESOLINE) 25 MG tablet Take 25 mg by mouth in the morning and at bedtime.    [provider]  Ipratropium-Albuterol (COMBIVENT) 20-100 MCG/ACT AERS respimat Inhale 1 puff into the lungs 4 (four) times daily as needed for wheezing or shortness of breath. 11/11/19    Mercy Riding, MD  lidocaine (LIDODERM) 5 % Place 1 patch onto the skin daily. Remove & Discard patch within 12 hours or as directed by MD 09/28/19   Nicole Kindred A, DO  lisinopril (ZESTRIL) 20 MG tablet Take 2 tablets (40 mg total) by mouth daily. 11/13/19   Mercy Riding, MD  metoprolol tartrate (LOPRESSOR) 25 MG tablet Take 0.5 tablets (12.5 mg total) by mouth 2 (two) times daily. 11/11/19   Mercy Riding, MD  nitroGLYCERIN (NITROSTAT) 0.4 MG SL tablet Place 1 tablet (0.4 mg total) under the tongue every 5 (five) minutes as needed for chest pain. 09/27/19   Ezekiel Slocumb, DO  omeprazole (PRILOSEC) 20 MG capsule Take 20 mg by mouth daily.    [provider]  ondansetron (ZOFRAN-ODT) 4 MG disintegrating tablet Take 4 mg by mouth every 8 (eight) hours as needed for nausea or vomiting. 09/12/19   [provider]  pravastatin (PRAVACHOL) 20 MG tablet Take 20 mg by mouth at bedtime.     [provider]  predniSONE (DELTASONE) 20 MG tablet Take 1 tablet (20 mg total) by mouth daily for 4 days. 11/17/19 11/21/19  Carrie Mew, MD  senna-docusate (SENOKOT-S) 8.6-50 MG tablet Take 1 tablet by mouth 2 (two) times daily between meals as needed for mild constipation. 11/11/19   Mercy Riding, MD  tamsulosin (FLOMAX) 0.4 MG CAPS capsule Take 0.4 mg by mouth daily.    [provider]  traZODone (DESYREL) 100 MG tablet Take 50-100 mg by mouth at bedtime. 11/06/19   [provider]  umeclidinium bromide (INCRUSE ELLIPTA) 62.5 MCG/INH AEPB Inhale 1 puff into the lungs daily. 11/11/19   Mercy Riding, MD  vitamin B-12 (CYANOCOBALAMIN) 1000 MCG tablet Take 1,000 mcg by mouth daily.    [provider]     Allergies Morphine and related, Nsaids, and Aspirin   Family History  Problem Relation Age of Onset  . Heart disease Father   . Heart disease Sister     Social History Social History   Tobacco Use  . Smoking status: Former Smoker    Packs/day: 0.75     Years: 40.00    Pack years: 30.00    Types: Cigarettes    Quit date: 11/05/2019    Years since quitting: 0.0  . Smokeless tobacco: Never Used  Vaping Use  . Vaping Use: Never used  Substance Use Topics  . Alcohol use: No  . Drug use: No    Review of Systems  Constitutional:   No fever or chills.  ENT:   No sore throat. No rhinorrhea. Cardiovascular:   No chest pain or syncope. Respiratory:   Positive shortness of breath without cough. Gastrointestinal:   Negative for abdominal pain, vomiting and diarrhea.  Musculoskeletal:   Negative for focal pain or swelling All other systems reviewed and are negative  except as documented above in ROS and HPI.  ____________________________________________   PHYSICAL EXAM:  VITAL SIGNS: ED Triage Vitals  Enc Vitals Group     BP 11/17/19 1203 (!) 142/50     Pulse Rate 11/17/19 1203 (!) 58     Resp 11/17/19 1203 16     Temp 11/17/19 1203 98.3 F (36.8 C)     Temp Source 11/17/19 1203 Oral     SpO2 11/17/19 1203 97 %     Weight 11/17/19 1205 145 lb (65.8 kg)     Height 11/17/19 1205 5\' 3"  (1.6 m)     Head Circumference --      Peak Flow --      Pain Score 11/17/19 1205 0     Pain Loc --      Pain Edu? --      Excl. in [place]? --     Vital signs reviewed, nursing assessments reviewed.   Constitutional:   Alert and oriented. Non-toxic appearance. Eyes:   Conjunctivae are normal. EOMI. PERRL. ENT      Head:   Normocephalic and atraumatic.      Nose:   Wearing a mask.      Mouth/Throat:   Wearing a mask.      Neck:   No meningismus. Full ROM. Hematological/Lymphatic/Immunilogical:   No cervical lymphadenopathy. Cardiovascular:   RRR. Symmetric bilateral radial and DP pulses.  No murmurs. Cap refill less than 2 seconds. Respiratory:   Normal respiratory effort without tachypnea/retractions.  Bilateral end expiratory wheezing and slightly prolonged expiratory phase.  No crackles. Gastrointestinal:   Soft and nontender. Non  distended. There is no CVA tenderness.  No rebound, rigidity, or guarding.  Musculoskeletal:   Normal range of motion in all extremities. No joint effusions.  No lower extremity tenderness.  Trace pedal edema bilaterally, symmetric. Neurologic:   Normal speech and language.  Motor grossly intact. No acute focal neurologic deficits are appreciated.  Skin:    Skin is warm, dry and intact. No rash noted.  No petechiae, purpura, or bullae.  ____________________________________________    LABS (pertinent positives/negatives) (all labs ordered are listed, but only abnormal results are displayed) Labs Reviewed  BASIC METABOLIC PANEL - Abnormal; Notable for the following components:      Result Value   BUN 24 (*)    All other components within normal limits  CBC - Abnormal; Notable for the following components:   RBC 3.17 (*)    Hemoglobin 9.8 (*)    HCT 30.1 (*)    RDW 16.6 (*)    Platelets 618 (*)    All other components within normal limits  SARS CORONAVIRUS 2 BY RT PCR (HOSPITAL ORDER, [place] [facility])  URINALYSIS, COMPLETE (UACMP) WITH MICROSCOPIC  CBG MONITORING, ED  TROPONIN I (HIGH SENSITIVITY)   ____________________________________________   EKG  Interpreted by me Sinus bradycardia rate of 59.  Normal axis.  First-degree AV block.  Normal QRS ST segments and T waves.  No ischemic changes.  ____________________________________________    [id number]  DG Chest Portable 1 View  Result Date: 11/17/2019 CLINICAL DATA:  Shortness of breath, weakness EXAM: PORTABLE CHEST 1 VIEW COMPARISON:  11/09/2019 FINDINGS: Aortic atherosclerosis. Heart is normal size. Mild hyperinflation of the lungs. No confluent opacities or effusions. No acute bony abnormality. IMPRESSION: Hyperinflation/COPD.  No active disease. Electronically Signed   By: Rolm Baptise M.D.   On: 11/17/2019 19:15     ____________________________________________   PROCEDURES Procedures  ____________________________________________  DIFFERENTIAL DIAGNOSIS   CHF exacerbation, COPD, pneumonia, dehydration, electrolyte abnormality  CLINICAL IMPRESSION / ASSESSMENT AND PLAN / ED COURSE  Medications ordered in the ED: Medications  doxycycline (VIBRA-TABS) tablet 100 mg (has no administration in time range)  ipratropium-albuterol (DUONEB) 0.5-2.5 (3) MG/3ML nebulizer solution 3 mL (3 mLs Nebulization Given 11/17/19 1942)    Pertinent labs & imaging results that were available during my care of the patient were reviewed by me and considered in my medical decision making (see chart for details).  Pamela James was evaluated in Emergency Department on 11/17/2019 for the symptoms described in the history of present illness. She was evaluated in the context of the global COVID-19 pandemic, which necessitated consideration that the patient might be at risk for infection with the SARS-CoV-2 virus that causes COVID-19. Institutional protocols and algorithms that pertain to the evaluation of patients at risk for COVID-19 are in a state of rapid change based on information released by regulatory bodies including the CDC and federal and state organizations. These policies and algorithms were followed during the patient's care in the ED.   Patient presents with dizziness, shortness of breath.  Symptoms are noncardiac, EKG is nonischemic, high-sensitivity troponin negative with symptoms ongoing for greater than 24 hours..  Chest x-ray unremarkable except for evidence of COPD.  Covid test negative, serum labs unremarkable.  Patient given a DuoNeb, feels much better, now smiling and breathing comfortably.  On repeat exam at 8:30 PM, lungs are clear to auscultation, normal expiratory phase, no wheezing.  Patient was able to ambulate for 5 minutes, maintaining normal oxygen saturation and without acute symptoms.  Treat the  patient for COPD exacerbation with low-dose prednisone, doxycycline, continue bronchodilators at home and follow-up with primary care.   Considering the patient's symptoms, medical history, and physical examination today, I have low suspicion for ACS, PE, TAD, pneumothorax, carditis, mediastinitis, pneumonia, CHF, or sepsis.        ____________________________________________   FINAL CLINICAL IMPRESSION(S) / ED DIAGNOSES    Final diagnoses:  COPD exacerbation [facility])     ED Discharge Orders         Ordered    doxycycline (VIBRAMYCIN) 100 MG capsule  2 times daily        11/17/19 2037    predniSONE (DELTASONE) 20 MG tablet  Daily        11/17/19 2037    albuterol (PROVENTIL HFA) 108 (90 Base) MCG/ACT inhaler  Every 4 hours PRN        11/17/19 2037          Portions of this note were generated with dragon dictation software. Dictation errors may occur despite best attempts at proofreading.   Carrie Mew, MD 11/17/19 2041

## 2019-11-17 NOTE — ED Notes (Signed)
Portable xray completed

## 2019-11-21 DIAGNOSIS — D472 Monoclonal gammopathy: Secondary | ICD-10-CM | POA: Diagnosis not present

## 2019-11-21 DIAGNOSIS — I110 Hypertensive heart disease with heart failure: Secondary | ICD-10-CM | POA: Diagnosis not present

## 2019-11-21 DIAGNOSIS — D473 Essential (hemorrhagic) thrombocythemia: Secondary | ICD-10-CM | POA: Diagnosis not present

## 2019-11-21 DIAGNOSIS — N1330 Unspecified hydronephrosis: Secondary | ICD-10-CM | POA: Diagnosis not present

## 2019-11-21 DIAGNOSIS — I5033 Acute on chronic diastolic (congestive) heart failure: Secondary | ICD-10-CM | POA: Diagnosis not present

## 2019-11-21 DIAGNOSIS — D509 Iron deficiency anemia, unspecified: Secondary | ICD-10-CM | POA: Diagnosis not present

## 2019-11-21 DIAGNOSIS — J441 Chronic obstructive pulmonary disease with (acute) exacerbation: Secondary | ICD-10-CM | POA: Diagnosis not present

## 2019-11-21 DIAGNOSIS — D4959 Neoplasm of unspecified behavior of other genitourinary organ: Secondary | ICD-10-CM | POA: Diagnosis not present

## 2019-11-21 DIAGNOSIS — E871 Hypo-osmolality and hyponatremia: Secondary | ICD-10-CM | POA: Diagnosis not present

## 2019-11-22 DIAGNOSIS — J441 Chronic obstructive pulmonary disease with (acute) exacerbation: Secondary | ICD-10-CM | POA: Diagnosis not present

## 2019-11-22 DIAGNOSIS — I110 Hypertensive heart disease with heart failure: Secondary | ICD-10-CM | POA: Diagnosis not present

## 2019-11-22 DIAGNOSIS — D509 Iron deficiency anemia, unspecified: Secondary | ICD-10-CM | POA: Diagnosis not present

## 2019-11-22 DIAGNOSIS — J440 Chronic obstructive pulmonary disease with acute lower respiratory infection: Secondary | ICD-10-CM | POA: Diagnosis not present

## 2019-11-22 DIAGNOSIS — J9621 Acute and chronic respiratory failure with hypoxia: Secondary | ICD-10-CM | POA: Diagnosis not present

## 2019-11-22 DIAGNOSIS — J209 Acute bronchitis, unspecified: Secondary | ICD-10-CM | POA: Diagnosis not present

## 2019-11-22 DIAGNOSIS — I5033 Acute on chronic diastolic (congestive) heart failure: Secondary | ICD-10-CM | POA: Diagnosis not present

## 2019-11-22 NOTE — Progress Notes (Signed)
Patient ID: Pamela James, female    DOB: 1945-05-18, 74 y.o.   MRN: [id number]  HPI  Pamela James is a 74 y/o female with a history of asthma, HTN, COPD, depression, anxiety, recent tobacco use and chronic heart failure.   Echo report from 11/10/19 reviewed and showed an EF of 60-65% along with mild LVH.   Was in the ED 11/17/19 due to dizziness and shortness of breath. CXR showed evidence of COPD. Given breathing treatment with improvement of symptoms. Released with prednisone and antibiotics. Admitted 11/09/19 due to HF exacerbation and AKI. Cardiology and nephrology consults obtained. Given IV diuretics with transition to oral medication along with treatment of electrolyte imbalances. Discharged after 2 days. Was in the ED 10/27/19 due abnormal potassium level. Potassium rechecked and was normal and she was released.   She presents today for her initial visit with a chief complaint of minimal shortness of breath upon moderate exertion. She describes this as chronic in nature having been present for several years. She has associated palpitations along with this. She denies any difficulty sleeping, dizziness, fatigue, abdominal distention, pedal edema, chest pain or cough.   Has scales at home but hasn't been weighing herself daily.   Past Medical History:  Diagnosis Date  . Anxiety   . Asthma   . CHF (congestive heart failure) ([place])   . COPD (chronic obstructive pulmonary disease) ([place])   . Depression   . GERD (gastroesophageal reflux disease)   . Hypertension    Past Surgical History:  Procedure Laterality Date  . ABDOMINAL HYSTERECTOMY    . TOTAL HIP ARTHROPLASTY Left   . TUBAL LIGATION     Family History  Problem Relation Age of Onset  . Heart disease Father   . Heart disease Sister    Social History   Tobacco Use  . Smoking status: Former Smoker    Packs/day: 0.75    Years: 40.00    Pack years: 30.00    Types: Cigarettes    Quit date: 11/05/2019    Years since quitting: 0.0  .  Smokeless tobacco: Never Used  Substance Use Topics  . Alcohol use: No   Allergies  Allergen Reactions  . Morphine And Related Other (See Comments)    Delerium, itching  . Nsaids Other (See Comments)    GI ulcers  . Aspirin Other (See Comments)    GI ulcers Can tolerate 81 mg daily   Prior to Admission medications   Medication Sig Start Date End Date Taking? Authorizing Provider  albuterol (PROVENTIL HFA) 108 (90 Base) MCG/ACT inhaler Inhale 2 puffs into the lungs every 4 (four) hours as needed for wheezing or shortness of breath. 11/17/19  Yes Carrie Mew, MD  albuterol (PROVENTIL) (2.5 MG/3ML) 0.083% nebulizer solution Take 2.5 mg by nebulization every 6 (six) hours as needed for wheezing or shortness of breath.   Yes [provider]  alendronate (FOSAMAX) 70 MG tablet Take 70 mg by mouth once a week. Take with a full glass of water on an empty stomach.   Yes [provider]  amLODipine (NORVASC) 10 MG tablet Take 10 mg by mouth daily.   Yes [provider]  apixaban (ELIQUIS) 5 MG TABS tablet Take 1 tablet (5 mg total) by mouth 2 (two) times daily. 09/29/19  Yes Ezekiel Slocumb, DO  aspirin EC 81 MG EC tablet Take 1 tablet (81 mg total) by mouth daily. 03/27/16  Yes Hillary Bow, MD  buPROPion [facility] SR) 150 MG  12 hr tablet Take 150 mg by mouth 2 (two) times daily. 11/06/19  Yes [provider]  feeding supplement, ENSURE ENLIVE, (ENSURE ENLIVE) LIQD Take 237 mLs by mouth daily. 09/27/19  Yes Nicole Kindred A, DO  ferrous sulfate 325 (65 FE) MG EC tablet Take 1 tablet (325 mg total) by mouth 2 (two) times daily. 11/11/19 05/09/20 Yes Mercy Riding, MD  fluticasone-salmeterol (ADVAIR HFA) 230-21 MCG/ACT inhaler Inhale 2 puffs into the lungs 2 (two) times daily.   Yes [provider]  furosemide (LASIX) 20 MG tablet Take 1 tablet (20 mg total) by mouth daily. 11/13/19 05/11/20 Yes Mercy Riding, MD  gabapentin (NEURONTIN) 300 MG capsule  Take 300 mg by mouth 2 (two) times daily.    Yes [provider]  hydrALAZINE (APRESOLINE) 25 MG tablet Take 25 mg by mouth in the morning and at bedtime.   Yes [provider]  Ipratropium-Albuterol (COMBIVENT) 20-100 MCG/ACT AERS respimat Inhale 1 puff into the lungs 4 (four) times daily as needed for wheezing or shortness of breath. 11/11/19  Yes Gonfa, Taye T, MD  lidocaine (LIDODERM) 5 % Place 1 patch onto the skin daily. Remove & Discard patch within 12 hours or as directed by MD 09/28/19  Yes Nicole Kindred A, DO  lisinopril (ZESTRIL) 20 MG tablet Take 2 tablets (40 mg total) by mouth daily. 11/13/19  Yes Mercy Riding, MD  metoprolol tartrate (LOPRESSOR) 25 MG tablet Take 0.5 tablets (12.5 mg total) by mouth 2 (two) times daily. 11/11/19  Yes Mercy Riding, MD  nitroGLYCERIN (NITROSTAT) 0.4 MG SL tablet Place 1 tablet (0.4 mg total) under the tongue every 5 (five) minutes as needed for chest pain. 09/27/19  Yes Nicole Kindred A, DO  omeprazole (PRILOSEC) 20 MG capsule Take 20 mg by mouth daily.   Yes [provider]  pravastatin (PRAVACHOL) 20 MG tablet Take 20 mg by mouth at bedtime.    Yes [provider]  senna-docusate (SENOKOT-S) 8.6-50 MG tablet Take 1 tablet by mouth 2 (two) times daily between meals as needed for mild constipation. 11/11/19  Yes Mercy Riding, MD  tamsulosin (FLOMAX) 0.4 MG CAPS capsule Take 0.4 mg by mouth daily.   Yes [provider]  traZODone (DESYREL) 100 MG tablet Take 50-100 mg by mouth at bedtime. 11/06/19  Yes [provider]  umeclidinium bromide (INCRUSE ELLIPTA) 62.5 MCG/INH AEPB Inhale 1 puff into the lungs daily. 11/11/19  Yes Mercy Riding, MD  vitamin B-12 (CYANOCOBALAMIN) 1000 MCG tablet Take 1,000 mcg by mouth daily.   Yes [provider]    Review of Systems  Constitutional: Positive for fatigue (minimal). Negative for appetite change.  HENT: Negative for congestion, postnasal drip and sore  throat.   Eyes: Negative.   Respiratory: Positive for shortness of breath (minimal). Negative for cough and chest tightness.   Cardiovascular: Positive for palpitations (at times). Negative for chest pain and leg swelling.  Gastrointestinal: Negative for abdominal distention and abdominal pain.  Endocrine: Negative.   Genitourinary: Negative.   Musculoskeletal: Negative for back pain and neck pain.  Skin: Negative.   Allergic/Immunologic: Negative.   Neurological: Negative for dizziness and light-headedness.  Hematological: Negative for adenopathy. Does not bruise/bleed easily.  Psychiatric/Behavioral: Negative for dysphoric mood and sleep disturbance (sleeping on 2 pillows). The patient is not nervous/anxious.    Vitals:   11/23/19 1323  BP: (!) 153/53  Pulse: (!) 46  Resp: 18  SpO2: 97%  Weight:  150 lb 4 oz (68.2 kg)  Height: 5\' 2"  (1.575 m)   Wt Readings from Last 3 Encounters:  11/23/19 150 lb 4 oz (68.2 kg)  11/17/19 147 lb 11.3 oz (67 kg)  11/09/19 164 lb 12.8 oz (74.8 kg)   Lab Results  Component Value Date   CREATININE 0.87 11/17/2019   CREATININE 1.20 (H) 11/11/2019   CREATININE 1.32 (H) 11/10/2019    Physical Exam Vitals and nursing note reviewed.  Constitutional:      Appearance: She is well-developed.  HENT:     Head: Normocephalic and atraumatic.  Neck:     Vascular: No JVD.  Cardiovascular:     Rate and Rhythm: Regular rhythm. Bradycardia present.  Pulmonary:     Effort: Pulmonary effort is normal. No respiratory distress.     Breath sounds: No wheezing or rales.  Abdominal:     Palpations: Abdomen is soft.     Tenderness: There is no abdominal tenderness.  Musculoskeletal:     Cervical back: Neck supple.     Right lower leg: No tenderness. No edema.     Left lower leg: No tenderness. No edema.  Skin:    General: Skin is warm and dry.  Neurological:     General: No focal deficit present.     Mental Status: She is alert and oriented to person,  place, and time.  Psychiatric:        Mood and Affect: Mood normal.        Behavior: Behavior normal.    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 Dash for seasoning; written dietary information given to patient and son - does not have cardiology f/u scheduled so this was scheduled for 12/07/19 - consider changing her lisinopril to entresto in the future - BNP 11/09/19 was 860.2 - reports receiving both covid vaccines  2: HTN- - BP mildly elevated today but she admits that she's a little nervous being here - saw PCP Tressia Miners) 11/06/19 - BMP 12/07/19 reviewed and showed sodium 138, potassium 4.6, creatinine 0.87 and GFR >60  3: COPD- - recently stopped smoking & she was congratulated on that.  - does use her nebulizer and inhalers during the day   Patient did not bring her medications nor a list. Each medication was verbally reviewed with the patient and she was encouraged to bring the bottles to every visit to confirm accuracy of list.  Return in 2 months or sooner for any questions/problems before then

## 2019-11-23 ENCOUNTER — Ambulatory Visit: Payer: Medicare HMO | Attending: Family | Admitting: Family

## 2019-11-23 ENCOUNTER — Encounter: Payer: Self-pay | Admitting: Family

## 2019-11-23 ENCOUNTER — Other Ambulatory Visit: Payer: Self-pay

## 2019-11-23 VITALS — BP 153/53 | HR 46 | Resp 18 | Ht 62.0 in | Wt 150.2 lb

## 2019-11-23 DIAGNOSIS — Z79899 Other long term (current) drug therapy: Secondary | ICD-10-CM | POA: Insufficient documentation

## 2019-11-23 DIAGNOSIS — Z886 Allergy status to analgesic agent status: Secondary | ICD-10-CM | POA: Insufficient documentation

## 2019-11-23 DIAGNOSIS — F329 Major depressive disorder, single episode, unspecified: Secondary | ICD-10-CM | POA: Diagnosis not present

## 2019-11-23 DIAGNOSIS — I5032 Chronic diastolic (congestive) heart failure: Secondary | ICD-10-CM

## 2019-11-23 DIAGNOSIS — Z87891 Personal history of nicotine dependence: Secondary | ICD-10-CM | POA: Diagnosis not present

## 2019-11-23 DIAGNOSIS — Z7982 Long term (current) use of aspirin: Secondary | ICD-10-CM | POA: Insufficient documentation

## 2019-11-23 DIAGNOSIS — Z7901 Long term (current) use of anticoagulants: Secondary | ICD-10-CM | POA: Diagnosis not present

## 2019-11-23 DIAGNOSIS — K219 Gastro-esophageal reflux disease without esophagitis: Secondary | ICD-10-CM | POA: Diagnosis not present

## 2019-11-23 DIAGNOSIS — F419 Anxiety disorder, unspecified: Secondary | ICD-10-CM | POA: Insufficient documentation

## 2019-11-23 DIAGNOSIS — Z885 Allergy status to narcotic agent status: Secondary | ICD-10-CM | POA: Insufficient documentation

## 2019-11-23 DIAGNOSIS — I110 Hypertensive heart disease with heart failure: Secondary | ICD-10-CM | POA: Insufficient documentation

## 2019-11-23 DIAGNOSIS — Z7951 Long term (current) use of inhaled steroids: Secondary | ICD-10-CM | POA: Diagnosis not present

## 2019-11-23 DIAGNOSIS — Z8249 Family history of ischemic heart disease and other diseases of the circulatory system: Secondary | ICD-10-CM | POA: Diagnosis not present

## 2019-11-23 DIAGNOSIS — J449 Chronic obstructive pulmonary disease, unspecified: Secondary | ICD-10-CM | POA: Insufficient documentation

## 2019-11-23 DIAGNOSIS — I10 Essential (primary) hypertension: Secondary | ICD-10-CM

## 2019-11-23 NOTE — Patient Instructions (Addendum)
Begin weighing daily and call for an overnight weight gain of > 2 pounds or a weekly weight gain of >5 pounds. 

## 2019-11-28 DIAGNOSIS — D472 Monoclonal gammopathy: Secondary | ICD-10-CM | POA: Diagnosis not present

## 2019-11-28 DIAGNOSIS — N1330 Unspecified hydronephrosis: Secondary | ICD-10-CM | POA: Diagnosis not present

## 2019-11-28 DIAGNOSIS — E871 Hypo-osmolality and hyponatremia: Secondary | ICD-10-CM | POA: Diagnosis not present

## 2019-11-28 DIAGNOSIS — D509 Iron deficiency anemia, unspecified: Secondary | ICD-10-CM | POA: Diagnosis not present

## 2019-11-28 DIAGNOSIS — J441 Chronic obstructive pulmonary disease with (acute) exacerbation: Secondary | ICD-10-CM | POA: Diagnosis not present

## 2019-11-28 DIAGNOSIS — I5033 Acute on chronic diastolic (congestive) heart failure: Secondary | ICD-10-CM | POA: Diagnosis not present

## 2019-11-28 DIAGNOSIS — D4959 Neoplasm of unspecified behavior of other genitourinary organ: Secondary | ICD-10-CM | POA: Diagnosis not present

## 2019-11-28 DIAGNOSIS — D473 Essential (hemorrhagic) thrombocythemia: Secondary | ICD-10-CM | POA: Diagnosis not present

## 2019-11-28 DIAGNOSIS — I110 Hypertensive heart disease with heart failure: Secondary | ICD-10-CM | POA: Diagnosis not present

## 2019-11-29 DIAGNOSIS — G9340 Encephalopathy, unspecified: Secondary | ICD-10-CM | POA: Diagnosis not present

## 2019-11-29 DIAGNOSIS — I5041 Acute combined systolic (congestive) and diastolic (congestive) heart failure: Secondary | ICD-10-CM | POA: Diagnosis not present

## 2019-11-29 DIAGNOSIS — E875 Hyperkalemia: Secondary | ICD-10-CM | POA: Diagnosis not present

## 2019-11-29 DIAGNOSIS — I110 Hypertensive heart disease with heart failure: Secondary | ICD-10-CM | POA: Diagnosis not present

## 2019-11-29 DIAGNOSIS — I5032 Chronic diastolic (congestive) heart failure: Secondary | ICD-10-CM | POA: Diagnosis not present

## 2019-11-29 DIAGNOSIS — M79602 Pain in left arm: Secondary | ICD-10-CM | POA: Diagnosis not present

## 2019-11-30 DIAGNOSIS — J441 Chronic obstructive pulmonary disease with (acute) exacerbation: Secondary | ICD-10-CM | POA: Diagnosis not present

## 2019-11-30 DIAGNOSIS — I110 Hypertensive heart disease with heart failure: Secondary | ICD-10-CM | POA: Diagnosis not present

## 2019-11-30 DIAGNOSIS — D473 Essential (hemorrhagic) thrombocythemia: Secondary | ICD-10-CM | POA: Diagnosis not present

## 2019-11-30 DIAGNOSIS — D4959 Neoplasm of unspecified behavior of other genitourinary organ: Secondary | ICD-10-CM | POA: Diagnosis not present

## 2019-11-30 DIAGNOSIS — D509 Iron deficiency anemia, unspecified: Secondary | ICD-10-CM | POA: Diagnosis not present

## 2019-11-30 DIAGNOSIS — E875 Hyperkalemia: Secondary | ICD-10-CM | POA: Diagnosis not present

## 2019-11-30 DIAGNOSIS — N1330 Unspecified hydronephrosis: Secondary | ICD-10-CM | POA: Diagnosis not present

## 2019-11-30 DIAGNOSIS — D472 Monoclonal gammopathy: Secondary | ICD-10-CM | POA: Diagnosis not present

## 2019-11-30 DIAGNOSIS — I5033 Acute on chronic diastolic (congestive) heart failure: Secondary | ICD-10-CM | POA: Diagnosis not present

## 2019-11-30 DIAGNOSIS — E871 Hypo-osmolality and hyponatremia: Secondary | ICD-10-CM | POA: Diagnosis not present

## 2019-12-01 ENCOUNTER — Other Ambulatory Visit: Payer: Self-pay | Admitting: Internal Medicine

## 2019-12-01 ENCOUNTER — Other Ambulatory Visit (HOSPITAL_COMMUNITY): Payer: Self-pay | Admitting: Internal Medicine

## 2019-12-01 DIAGNOSIS — I771 Stricture of artery: Secondary | ICD-10-CM

## 2019-12-01 DIAGNOSIS — I10 Essential (primary) hypertension: Secondary | ICD-10-CM

## 2019-12-06 DIAGNOSIS — E875 Hyperkalemia: Secondary | ICD-10-CM | POA: Diagnosis not present

## 2019-12-06 DIAGNOSIS — I5030 Unspecified diastolic (congestive) heart failure: Secondary | ICD-10-CM | POA: Diagnosis not present

## 2019-12-06 DIAGNOSIS — I82722 Chronic embolism and thrombosis of deep veins of left upper extremity: Secondary | ICD-10-CM | POA: Diagnosis not present

## 2019-12-06 DIAGNOSIS — Z7689 Persons encountering health services in other specified circumstances: Secondary | ICD-10-CM | POA: Diagnosis not present

## 2019-12-06 DIAGNOSIS — R413 Other amnesia: Secondary | ICD-10-CM | POA: Diagnosis not present

## 2019-12-06 DIAGNOSIS — Z23 Encounter for immunization: Secondary | ICD-10-CM | POA: Diagnosis not present

## 2019-12-06 DIAGNOSIS — R2681 Unsteadiness on feet: Secondary | ICD-10-CM | POA: Diagnosis not present

## 2019-12-06 DIAGNOSIS — F331 Major depressive disorder, recurrent, moderate: Secondary | ICD-10-CM | POA: Diagnosis not present

## 2019-12-06 DIAGNOSIS — I110 Hypertensive heart disease with heart failure: Secondary | ICD-10-CM | POA: Diagnosis not present

## 2019-12-06 DIAGNOSIS — J449 Chronic obstructive pulmonary disease, unspecified: Secondary | ICD-10-CM | POA: Diagnosis not present

## 2019-12-07 ENCOUNTER — Other Ambulatory Visit: Payer: Self-pay

## 2019-12-07 ENCOUNTER — Ambulatory Visit (INDEPENDENT_AMBULATORY_CARE_PROVIDER_SITE_OTHER): Payer: Medicare HMO | Admitting: Physician Assistant

## 2019-12-07 ENCOUNTER — Encounter: Payer: Self-pay | Admitting: Physician Assistant

## 2019-12-07 VITALS — BP 130/42 | HR 50 | Ht 62.0 in | Wt 151.0 lb

## 2019-12-07 DIAGNOSIS — I82722 Chronic embolism and thrombosis of deep veins of left upper extremity: Secondary | ICD-10-CM

## 2019-12-07 DIAGNOSIS — I5033 Acute on chronic diastolic (congestive) heart failure: Secondary | ICD-10-CM | POA: Diagnosis not present

## 2019-12-07 DIAGNOSIS — D649 Anemia, unspecified: Secondary | ICD-10-CM

## 2019-12-07 DIAGNOSIS — I5031 Acute diastolic (congestive) heart failure: Secondary | ICD-10-CM | POA: Diagnosis not present

## 2019-12-07 DIAGNOSIS — E785 Hyperlipidemia, unspecified: Secondary | ICD-10-CM

## 2019-12-07 DIAGNOSIS — J449 Chronic obstructive pulmonary disease, unspecified: Secondary | ICD-10-CM

## 2019-12-07 DIAGNOSIS — I509 Heart failure, unspecified: Secondary | ICD-10-CM

## 2019-12-07 DIAGNOSIS — Z87891 Personal history of nicotine dependence: Secondary | ICD-10-CM

## 2019-12-07 DIAGNOSIS — I10 Essential (primary) hypertension: Secondary | ICD-10-CM | POA: Diagnosis not present

## 2019-12-07 DIAGNOSIS — R001 Bradycardia, unspecified: Secondary | ICD-10-CM

## 2019-12-07 DIAGNOSIS — Z7901 Long term (current) use of anticoagulants: Secondary | ICD-10-CM

## 2019-12-07 DIAGNOSIS — F419 Anxiety disorder, unspecified: Secondary | ICD-10-CM

## 2019-12-07 DIAGNOSIS — D472 Monoclonal gammopathy: Secondary | ICD-10-CM | POA: Diagnosis not present

## 2019-12-07 DIAGNOSIS — R0989 Other specified symptoms and signs involving the circulatory and respiratory systems: Secondary | ICD-10-CM

## 2019-12-07 DIAGNOSIS — D473 Essential (hemorrhagic) thrombocythemia: Secondary | ICD-10-CM

## 2019-12-07 DIAGNOSIS — R002 Palpitations: Secondary | ICD-10-CM | POA: Diagnosis not present

## 2019-12-07 DIAGNOSIS — J441 Chronic obstructive pulmonary disease with (acute) exacerbation: Secondary | ICD-10-CM | POA: Diagnosis not present

## 2019-12-07 DIAGNOSIS — R42 Dizziness and giddiness: Secondary | ICD-10-CM

## 2019-12-07 DIAGNOSIS — I110 Hypertensive heart disease with heart failure: Secondary | ICD-10-CM | POA: Diagnosis not present

## 2019-12-07 DIAGNOSIS — D75839 Thrombocytosis, unspecified: Secondary | ICD-10-CM

## 2019-12-07 DIAGNOSIS — D4959 Neoplasm of unspecified behavior of other genitourinary organ: Secondary | ICD-10-CM | POA: Diagnosis not present

## 2019-12-07 DIAGNOSIS — D509 Iron deficiency anemia, unspecified: Secondary | ICD-10-CM | POA: Diagnosis not present

## 2019-12-07 DIAGNOSIS — N1330 Unspecified hydronephrosis: Secondary | ICD-10-CM | POA: Diagnosis not present

## 2019-12-07 DIAGNOSIS — Z79899 Other long term (current) drug therapy: Secondary | ICD-10-CM | POA: Diagnosis not present

## 2019-12-07 NOTE — Progress Notes (Signed)
Office Visit    Patient Name: Pamela James Date of Encounter: 12/07/2019  Primary Care Provider:  Tracie Harrier, MD Primary Cardiologist:  Ida Rogue, MD  Chief Complaint    Chief Complaint  Patient presents with  . other    [facility] follow up CHF. Meds reviewed by the pt.'s bottles. Pt. c/o Blood pressure fluctuating.     74 y.o. 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  Past Medical History    Past Medical History:  Diagnosis Date  . Anxiety   . Asthma   . CHF (congestive heart failure) ([place])   . COPD (chronic obstructive pulmonary disease) ([place])   . Depression   . GERD (gastroesophageal reflux disease)   . Hypertension    Past Surgical History:  Procedure Laterality Date  . ABDOMINAL HYSTERECTOMY    . TOTAL HIP ARTHROPLASTY Left   . TUBAL LIGATION      Allergies  Allergies  Allergen Reactions  . Morphine And Related Other (See Comments)    Delerium, itching  . Nsaids Other (See Comments)    GI ulcers  . Aspirin Other (See Comments)    GI ulcers Can tolerate 81 mg daily    History of Present Illness    Pamela James is a 74 y.o. female with PMH as above.   Pamela James is a 74 year old female with PMH as above including hyperlipidemia, hypertension, hyperparathyroidism, anxiety/depression/bipolar, COPD, asthma, GERD, MGUS, left hydronephrosis s/p left ureteroscopy on 09/12/2019 with stent and tumor biopsy.   She has been followed closely by urology for gross hematuria and left-sided hydronephrosis.  She had a cytourethroscopy done 09/12/2019 and stent placement/biopsy of the ureter stent.  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  She follows with hematology for thrombocytosis and MGUS.   She was admitted 09/15/2019-09/27/19 to [facility] after an episode of syncope with bradycardia that resulted in CPR and admission to [facility]. Hospitalization was also complicated by aspiration pneumonia with Unasyn, pulmonary edema requiring IV Lasix, left subclavian clot, hypertensive urgency, ?alcohol withdrawal, metabolic encephalopathy, and bradycardia/hypotension. At presentation, she was found to be in acute hypoxic respiratory failure, intubated, and admitted to ICU .  She was hypotensive and bradycardic, requiring Precedex and dopamine drips.   She was started on Eliquis for left subclavian clot.  She was extubated and transferred out of the ICU; however, due to hypertensive urgency, she required transfer back to the ICU for nicardipine drip.  She had pulmonary vascular congestion and received IV Lasix with subsequent improvement in respiratory status and placed back on room air.  She was evaluated by urology with notes indicating hematuria likely secondary to stenting and potential bleeding from ureteral tumor.  She was discharged home to live with her son 09/27/2019.  Of note, 09/2019 echo with normal EF and as copied below.   Also of note, 09/16/2019 ultrasound of the upper left extremity showed partially occluded thrombus of left subclavian vein.  She also has occlusive thrombus of the axillary vein that is noncompressible, occlusive thrombus of the cephalic vein, which is also noncompressible, occlusive thrombus of the brachial veins, occlusive thrombus of the radial veins, occlusive thrombus of the ulnar veins.  Overall impression is left  upper extremity positive for DVT of the subclavian vein, axillary vein, and paired brachial veins.  Occlusive of the axillary paired brachial veins, and nonocclusive at the subclavian vein.  Superficial occlusive thrombophlebitis of the cephalic vein, basilic vein,  radial vein, and ulnar vein.  She was seen in follow-up by Dr. Tressia Miners 09/25/2019.  She reported chest pain 2/2 MSK from compressions.  She reported low-grade fevers at home.  BP was well controlled.  At subsequent follow-up, she was eating bacon and watermelon with extra salt on it.  BP 200/68.  Low-salt diet was advised.  She was continued on Norvasc, lisinopril, metoprolol, and hydralazine.  Potassium was high with possible discontinuation of lisinopril noted pending repeat labs.  She was still smoking and noted nicotine patches were too expensive.  Bupropion was started and she was continued on inhalers. She was on oxybutynin and Flomax with known left ureteral obstruction and hematuria, followed closely by urology.   On 11/09/2019, she presented to the emergency department after feeling increasing shortness of breath, orthopnea, PND, and weakness over the last few days.  In the ED, she was found to be hypoxic with saturations down to 85% and placed on 2 L nasal cannula oxygen.  She was also noted to be volume overloaded and hyperkalemic with AKI and ARF in the setting of obstructive nephropathy with severe left hydronephrosis.  Vitals significant for HR 42 to 51 bpm.  Labs showed BNP 860, sodium 121, potassium 6.4, creatinine 1.67 (baseline 0.83).  HS Tn 18 x2. CT renal stone study showed removed left renal stent and new moderately severe left hydronephrosis.  It was noted she had been followed by [facility], dating back to March 2021 for moderate to severe left hydronephrosis.  Imaging 10/2019 also showed attenuation with mid and distal left ureter was thought due to hemorrhage by tumor within the ureter.  Small bilateral pleural effusions were noted, as well as improved bibasilar airspace disease and right renal scarring with 3 nonobstructing stones.  She was started on IV Lasix 20 mg, IV calcium gluconate, bicarb, dextrose with insulin, veltassa. Due to ATN and obstructive uropathy, foley catheter was  placed on arrival. At presentation, intermittent bradycardia was noted with atropine administered.  EKG at presentation showed junctional rhythm at 46 bpm, and nonspecific ST abnormality.  Cardiology was consulted and thought junctional rhythm most likely due to to electrolyte abnormality with recommendation to repeat electrolytes with suspected improvement in the her rhythm. With improved electrolytes (hyponatremia, hyperkalemia), it was noted that repeat EKG also showed improved rate and resolution of junctional rhythm.  Limited echo was performed due to electrical alternans on telemetry and echo as below and showed EF 60 to 65%, no regional wall motion abnormalities, mild LVH, indeterminate diastolic parameters. Right heart pressures did not appear elevated and pulmonary edema was thus thought 2/2 hydronephrosis and obstructive uropathy as cardiac workup was relatively unrevealing. PRN lasix was provided for wt gain, as it was not felt she needed daily diuresis. Smoking cessation was advised, as well as follow-up with pulmonology.   She was in the ED 11/17/2019 due to dizziness and shortness of breath.  Chest x-ray showed COPD.  EKG was without ischemic ST/T changes.  Labs unremarkable.  She received a breathing treatment with DuoNeb improvement of symptoms.  Repeat exam was without significant findings and lungs clear to auscultation.  She was able to ambulate for 5 minutes with normal oxygen saturations and no acute symptoms.  She was treated with prednisone and  antibiotics/doxycycline.  She was seen by the heart failure clinic 11/23/2019.  At that time, she reported minimal shortness of breath upon moderate exertion.  She described her shortness of breath/dyspnea is chronic in nature and persistent for several years.  She noted palpitations with her dyspnea.  She denied any other additional symptoms, including difficulty sleeping, dizziness, fatigue, abdominal distention, pedal edema, chest pain, and cough.   She reported having a home scale but not weighing herself daily.  BP 153/53 with pulse 46 bpm.  She reported elevated BP in the setting of anxiety.  SPO2 97% and weight 150 pounds.  She was instructed to weigh herself daily and call for overnight weight gain of greater than 2 pounds or weekly weight gain of greater than 5 pounds.  Mrs. Deliah Boston was recommended and further dietary instructions reviewed.  It was recommended to consider changing her lisinopril to [facility] in the future.  She reported receiving both COVID-19 vaccines.  She had recently stopped smoking and reported that she was still using nebulizers and inhalers during the day.  She was seen by her PCP on 11/29/2019.  At that time, BP 104/50 with HR 68 bpm.  Weight 151 pounds.  It was noted that she was set up with home health.  She reported trouble with her memory ever since her previous lengthy hospitalization 09/2019 with acute hypoxic respiratory failure with aspiration pneumonia, pulmonary edema, left subclavian clot, and metabolic encephalopathy.  Per review of notes, the patient son was with her at the time of this office visit and reported frustration regarding his mother's condition and lack of coordination between home health nurse and the physician.  He was frustrated that she had been hospitalized twice since her last visit.  He was frustrated regarding the discrepancy in blood pressure in both arms, as blood pressure was always lower on the left side.  Per patient, her main report was feeling strange.  She reported a pressure in her chest when ambulating and also occasional numbness and pain along her left arm.  She also noted depression, as she was unable to be outdoors and do a lot of things that she had done before in the past.  Of note, after that time, she switched PCPs from Dr. Tressia Miners to Dr. Ginette Pitman.  She was seen by her new PCP, Dr. Ginette Pitman, 9/22. Wellbutrin was discontinued and she was started on citalopram 10 mg p.o. daily and  trazodone for her anxiety.  She was instructed to increase fluid intake with review of recommendation for fluid restriction yet also maintaining hydration reviewed in detail.  She also received her flu shot at that time (9/22).  BP 110/78 with HR 67 bpm and SPO2 97%.  Weight 150 pounds.   Today, 12/07/2019, she presents to clinic with her son, who has been helping to manage her medications.  Her son notes labile pressures and confusion regarding these pressures when compared with pressures taken by home health and those at our clinic.  In addition, he is concerned that he may give a medication to his mother based on a BP reading that is an error and cause her more harm than good.  Long discussion today regarding the proper technique for blood pressure measurement, as well as information provided on AVS print out today.  In addition, we discussed reasons that BP measurements can be elevated or decreased.  Given her palpitations and PACs, it was noted that her BP reading could be low in the setting of a PAC.  The size of BP cuff and timing of BP measurement in terms of medication administration and also activity and other factors was reviewed.  He was instructed to call the office if any questions regarding the instructions provided today.  The patient herself reports ongoing palpitations.  In addition, she at times has noted headache and blurry vision.  This usually occurs 3-4 times per week and in the setting of elevated blood pressure.  It resolves with resolution of her blood pressure.  She denies any signs or symptoms of bleeding, given she is on anticoagulation.  She reports that she has been compliant with her anticoagulation.  She occasionally feels dizzy.  She denies chest pain (as reported above at previous PCP visit), pnd, orthopnea, n, v, syncope, weight gain, or early satiety. Medications reviewed with metoprolol tartrate increased from previous 12.5 mg twice daily to Lopressor 25 mg twice daily.   Hydralazine has been reduced from TID to BID. She is not certain if she has rebound sx between doses. This medication is reportedly one of the ones she should hold if BP is at a certain value or below (SBP 110 or below). Lasix has been increased to lasix 20mg  daily. She has not needed her nitro.  She will stop her ASA, given she is on both ASA and Eliquis at a higher risk of bleeding given her history as above.  She continues to use breathing treatments.  She continues to report smoking cessation, and she was congratulated on quitting smoking.  She has cut back on salt and uses Mrs. Deliah Boston, though her son notes she uses a significant amount of Mrs. Dash. She reportedly is only drinking 1 glass of water per day.  Reviewed recommendations to be cautious when using salt substitutes due to potassium.  She has significantly cut back on sodas.  During her clinic visit today, she notes fatigue and paresthesias.  She notes overall decreased energy since her previous admissions described above.    She is followed by [facility]. I do not have access to these records.   Home Medications    Prior to Admission medications   Medication Sig Start Date End Date Taking? Authorizing Provider  albuterol (PROVENTIL HFA) 108 (90 Base) MCG/ACT inhaler Inhale 2 puffs into the lungs every 4 (four) hours as needed for wheezing or shortness of breath. 11/17/19   Carrie Mew, MD  albuterol (PROVENTIL) (2.5 MG/3ML) 0.083% nebulizer solution Take 2.5 mg by nebulization every 6 (six) hours as needed for wheezing or shortness of breath.    [provider]  alendronate (FOSAMAX) 70 MG tablet Take 70 mg by mouth once a week. Take with a full glass of water on an empty stomach.    [provider]  amLODipine (NORVASC) 10 MG tablet Take 10 mg by mouth daily.    [provider]  apixaban (ELIQUIS) 5 MG TABS tablet Take 1 tablet (5 mg total) by mouth 2 (two) times daily. 09/29/19   Ezekiel Slocumb, DO   aspirin EC 81 MG EC tablet Take 1 tablet (81 mg total) by mouth daily. 03/27/16   Hillary Bow, MD  buPROPion (WELLBUTRIN SR) 150 MG 12 hr tablet Take 150 mg by mouth 2 (two) times daily. 11/06/19   [provider]  feeding supplement, ENSURE ENLIVE, (ENSURE ENLIVE) LIQD Take 237 mLs by mouth daily. 09/27/19   Nicole [facility] A, DO  ferrous sulfate 325 (65 FE) MG EC tablet Take 1 tablet (325 mg total) by mouth  2 (two) times daily. 11/11/19 05/09/20  Mercy Riding, MD  fluticasone-salmeterol (ADVAIR HFA) 230-21 MCG/ACT inhaler Inhale 2 puffs into the lungs 2 (two) times daily.    [provider]  furosemide (LASIX) 20 MG tablet Take 1 tablet (20 mg total) by mouth daily. 11/13/19 05/11/20  Mercy Riding, MD  gabapentin (NEURONTIN) 300 MG capsule Take 300 mg by mouth 2 (two) times daily.     [provider]  hydrALAZINE (APRESOLINE) 25 MG tablet Take 25 mg by mouth in the morning and at bedtime.    [provider]  Ipratropium-Albuterol (COMBIVENT) 20-100 MCG/ACT AERS respimat Inhale 1 puff into the lungs 4 (four) times daily as needed for wheezing or shortness of breath. 11/11/19   Mercy Riding, MD  lidocaine (LIDODERM) 5 % Place 1 patch onto the skin daily. Remove & Discard patch within 12 hours or as directed by MD 09/28/19   Nicole [facility] A, DO  lisinopril (ZESTRIL) 20 MG tablet Take 2 tablets (40 mg total) by mouth daily. 11/13/19   Mercy Riding, MD  metoprolol tartrate (LOPRESSOR) 25 MG tablet Take 0.5 tablets (12.5 mg total) by mouth 2 (two) times daily. 11/11/19   Mercy Riding, MD  nitroGLYCERIN (NITROSTAT) 0.4 MG SL tablet Place 1 tablet (0.4 mg total) under the tongue every 5 (five) minutes as needed for chest pain. 09/27/19   Ezekiel Slocumb, DO  omeprazole (PRILOSEC) 20 MG capsule Take 20 mg by mouth daily.    [provider]  pravastatin (PRAVACHOL) 20 MG tablet Take 20 mg by mouth at bedtime.     [provider]  senna-docusate  (SENOKOT-S) 8.6-50 MG tablet Take 1 tablet by mouth 2 (two) times daily between meals as needed for mild constipation. 11/11/19   Mercy Riding, MD  tamsulosin (FLOMAX) 0.4 MG CAPS capsule Take 0.4 mg by mouth daily.    [provider]  traZODone (DESYREL) 100 MG tablet Take 50-100 mg by mouth at bedtime. 11/06/19   [provider]  umeclidinium bromide (INCRUSE ELLIPTA) 62.5 MCG/INH AEPB Inhale 1 puff into the lungs daily. 11/11/19   Mercy Riding, MD  vitamin B-12 (CYANOCOBALAMIN) 1000 MCG tablet Take 1,000 mcg by mouth daily.    [provider]    Review of Systems    She reports palpitations. Intermittent headache and blurry vision is also reported and is associated with elevated blood pressure, approximately occurring 3-4 times per week.  She is joined today by her son, he notes difficulty managing her medications and taking her blood pressure.  She denies chest pain, dyspnea, pnd, orthopnea, n, v, syncope, edema, weight gain, or early satiety.  She reports intermittent dizziness.   All other systems reviewed and are otherwise negative except as noted above.  Physical Exam    VS:  BP (!) 130/42 (BP Location: Right Arm, Patient Position: Sitting, Cuff Size: Normal)   Pulse (!) 50   Ht 5\' 2"  (1.575 m)   Wt 151 lb (68.5 kg)   SpO2 98%   BMI 27.62 kg/m  , BMI Body mass index is 27.62 kg/m. GEN: Well nourished, well developed, in no acute distress.  Seated in wheelchair.  Joined by her son. HEENT: normal. Neck: Supple.  JVD +.  Left-sided carotid bruit appreciated versus radiation from murmur / LUE thrombi as outlined above  No right-sided carotid bruit.  No masses.   Cardiac: Bradycardic but regular with occasional extrasystole appreciated.  2/6 systolic murmur in  RUSB.  No rubs, or gallops. No clubbing, cyanosis.  Bilateral mild/nonpitting edema.  Radials/DP/PT 2+ and equal bilaterally.  Respiratory:  Respirations regular and unlabored, coarse breath sounds  bilaterally, bibasilar crackles worse on the R than L. GI: Soft, nontender, nondistended, BS + x 4.   MS: no deformity or atrophy.  Skin: warm and dry, no rash. Neuro:  Strength and sensation are intact. Psych: Normal affect.  Accessory Clinical Findings    ECG personally reviewed by me today - Junctional rhythm versus SB with PACs given artifact and thought more consistent with junctional rhythm on comparison with previous EKGs, 50bpm - no acute changes.  VITALS Reviewed today   Temp Readings from Last 3 Encounters:  11/17/19 98.1 F (36.7 C) (Oral)  11/11/19 97.7 F (36.5 C)  09/27/19 97.7 F (36.5 C)   BP Readings from Last 3 Encounters:  12/07/19 (!) 130/42  11/23/19 (!) 153/53  11/17/19 (!) 145/56   Pulse Readings from Last 3 Encounters:  12/07/19 (!) 50  11/23/19 (!) 46  11/17/19 63    Wt Readings from Last 3 Encounters:  12/07/19 151 lb (68.5 kg)  11/23/19 150 lb 4 oz (68.2 kg)  11/17/19 147 lb 11.3 oz (67 kg)     LABS  reviewed today   Lab Results  Component Value Date   WBC 7.2 11/17/2019   HGB 9.8 (L) 11/17/2019   HCT 30.1 (L) 11/17/2019   MCV 95.0 11/17/2019   PLT 618 (H) 11/17/2019   Lab Results  Component Value Date   CREATININE 0.87 11/17/2019   BUN 24 (H) 11/17/2019   NA 138 11/17/2019   K 4.6 11/17/2019   CL 102 11/17/2019   CO2 27 11/17/2019   Lab Results  Component Value Date   ALT 31 11/09/2019   AST 26 11/09/2019   ALKPHOS 87 11/09/2019   BILITOT 0.9 11/09/2019   Lab Results  Component Value Date   CHOL 139 03/25/2016   HDL 46 03/25/2016   LDLCALC 76 03/25/2016   TRIG 114 09/18/2019   CHOLHDL 3.0 03/25/2016    Lab Results  Component Value Date   HGBA1C 5.4 09/16/2019   No results found for: TSH    Recent care everywhere labs: Glucose 107, sodium 134, potassium 4.6, chloride 100, CO2 32.2, calcium 10.4, BUN 37, creatinine 1.2, GFR 53, anion gap 6.4,  07/25/2019 TSH 0.693. STUDIES/PROCEDURES reviewed today   Limited  echo 11/10/2019 1. Left ventricular ejection fraction, by estimation, is 60 to 65%. The  left ventricle has normal function. The left ventricle has no regional  wall motion abnormalities. There is mild left ventricular hypertrophy.  Left ventricular diastolic parameters  are indeterminate.  2. Right ventricular systolic function is normal. The right ventricular  size is normal.  3. The mitral valve is normal in structure. No evidence of mitral valve  regurgitation. No evidence of mitral stenosis.   Echo  09/2019 1. Left ventricular ejection fraction, by estimation, is 60 to 65%. The  left ventricle has normal function. The left ventricle has no regional  wall motion abnormalities. Left ventricular diastolic parameters were  normal.  2. Right ventricular systolic function is normal. The right ventricular  size is normal.  3. The mitral valve is grossly normal. No evidence of mitral valve  regurgitation.  4. The aortic valve is grossly normal. Aortic valve regurgitation is not  visualized. Echo 07/2019  Echo 07/2019   [place]  [id number]  DOB: [birth year]         CARDIAC DIAGNOSTIC UNIT        Date: 08/08/2019 14:30:00                            Adult   Female Age: 33          ECHO-DOPPLER REPORT         Outpatient                            MPDC  ---------------------------------------------------- MD1: Romeo Apple   STUDY: Chest Wall     TAPE: 0000:00:0:00:00 BP: 100/91    ECHO: Yes  DOPPLER: Yes FILE: [id number]:  HR: 52   COLOR: Yes CONTRAST: No   MACHINE: [id number] #3 Height: 62 in  RV BIOPSY: No     3D: No  SOUND QLTY: Moderate Weight: 139 lbs   MEDIUM: None                   BSA: 1.66, BMI: 25.40   ------------------------------------------------------------------------------   HISTORY: pre- Surgery   REASON: Assess LV function Assess RV function  INDICATION: I10 - Essential (primary) hypertension. P23.300 - Encounter for       other preprocedural examination. R01.1 - Cardiac murmur,       unspecified.      ECHOCARDIOGRAPHIC MEASUREMENTS -----------------------------------------------  2D DIMENSIONS  AORTA     Values   Normal RangeMAIN PA   Values   Normal Range    Annulus: nm* cm  [1.9 - 2.7]  PA Main: nm* cm  [1.5 - 2.1]   Aorta Sin: nm* cm  [2.4 - 3.6] RIGHT VENTRICLE  ST Junction: nm* cm  [2 - 3.2]   RV Base:  3.7 cm  [2.5 - 4.1]   Asc.Aorta: nm* cm  [1.9 - 3.5]   RV Mid:  2.5 cm  [1.9 - 3.5]  LEFT VENTRICLE             RV Length: nm* cm  [ ]      LVIDd:  4.3 cm  [3.7 - 5.3] RIGHT ATRIUM     LVIDs:  2.4 cm  [2.2 - 3.4]  RA Area: 12  cm2  [ <= 20]    LVEDVi: 76.0 ml/m2 [29 - 61]     RAVi: 14  ml/m2 [15 - 27]    LVESVi: 21.0 ml/m2 [8 - 24]  INFERIOR VENA CAVA      FS: 44  %   [ >= 25]    Max.IVC:  1.6 cm  [ <= 2.1]      SWT:  1.1 cm  [0.6 - 0.9]  Min.IVC: nm* cm  [ <= 1.7]      PWT:  1.1 cm  [0.6 - 0.9] __________________  LEFT ATRIUM              nm* - not measured    LA Diam:  3.7 cm  [2.7 - 3.8]    LA Area: 18  cm2  [ <= 20]   LA Volume: 52  ml  [22 - 52]     LAVi: 31  ml/m2 [16 - 34]    ECHOCARDIOGRAPHIC DESCRIPTIONS -----------------------------------------------  AORTIC ROOT     Size: Not seen  Dissection: INDETERM FOR DISSECTION    AORTIC VALVE   Leaflets: Tricuspid       Morphology: MILDLY THICKENED  Mobility: Fully Mobile    LEFT VENTRICLE                   Anterior: Normal     Size: Normal                  Lateral: Normal  Contraction: Normal                 Septal: Normal  Closest EF: >55%(Estimated) Calc.EF: 72% (3D)   Apical: Normal   LV masses: No Masses               Inferior: Normal      LVH: MILD LVH CONCENTRIC         Posterior: Normal  LV GLS(AVG): -17.6% Normal Range [ <= -16]  Dias.FxClass: Normal    MITRAL VALVE   Leaflets: Normal         Mobility: Fully mobile  Morphology: Normal    LEFT ATRIUM     Size: Normal   LA masses: No masses        Normal IAS    MAIN PA     Size: Normal    PULMONIC VALVE  Morphology: Normal   Mobility: Fully Mobile    RIGHT VENTRICLE     Size: Normal          Free wall: Normal  Contraction: Normal          RV masses: No Masses     TAPSE:  1.9 cm, Normal Range [>= 1.6 cm]    TRICUSPID VALVE   Leaflets: Normal         Mobility: Fully mobile  Morphology: Normal    RIGHT ATRIUM     Size: Normal           RA Other: None   RA masses: No masses    PERICARDIUM     Fluid: TRIVIAL FLUID    INFERIOR VENACAVA     Size: Normal   Normal respiratory collapse    DOPPLER ECHO and OTHER SPECIAL PROCEDURES ------------------------------------   Aortic: No AR         No AS     Mitral: TRIVIAL MR       No MS   MV Inflow E Vel.= 74.0 cm/s MV Annulus E'Vel.= 8.5 cm/s E/E'Ratio= 9    Tricuspid: TRIVIAL TR       No TS    Pulmonary: MILD PR        No PS     Other:    INTERPRETATION ---------------------------------------------------------------  NORMAL LEFT VENTRICULAR SYSTOLIC FUNCTION WITH MILD LVH  NORMAL LA PRESSURES WITH NORMAL DIASTOLIC FUNCTION  NORMAL RIGHT VENTRICULAR SYSTOLIC FUNCTION  VALVULAR REGURGITATION: TRIVIAL MR, MILD PR, TRIVIAL TR  NO VALVULAR STENOSIS  TRIVIAL  PERICARDIAL EFFUSION    Compared with prior Echo study on 02/22/2009: ([place]): NO  SIGNIFICANT CHANGES      (Report version 3.0)          Interpreted and Electronically signed  Perform. by: Livingston Diones, RDCS        by: Ignatius Specking, MD  Resp.Person: Livingston Diones, RDCS        On: 08/08/2019 17:25:43     Recent care everywhere labs 12/06/2019: Glucose 107, sodium 134, potassium 4.6, chloride 100, CO2 32.2, calcium 10.4, BUN 37, creatinine 1.2, GFR 53, anion gap 6.4,  07/25/2019: TSH 0.693  Assessment & Plan    Acute on chronic diastolic congestive heart failure --No significant SOB /  DOE.  Reports symptoms with elevated BP and recent labile pressures, though these are also noted in the setting of taking pressures at inconsistent times during the day and both before and after her medications.  Reports ongoing/chronic/unchanged dyspnea with moderate activity.  Repeat limited echo performed during 10/2019 admission for electrical alternans on telemetry showed nl EF, NRWMA, no severe/significant valvular abnormalities, and R heart pressures as above.  She was discharged with as needed Lasix.  Since that time, her Lasix has been increased to a daily dose, BB dose increased, hydralazine dose frequency reduced to twice daily dosing (vs TID), lisinopril discontinued. Some medication confusion and treatment plan confusion noted in the setting of multiple providers and recent PCP switch. She is volume up on exam with weight noted to be increased on review of previous weights, though with consideration that these are on different scales. Given history of AKI, we will defer any medication changes today pending labs, including increased diuresis. Obtain BNP today to help guide management and also assist with trending in the future.  Given her diuresis has been increased, obtain CMP to recheck renal function and electrolytes given her history of abnormal  electrolytes and AKI. This will also allow [place] to r/o 3rd spacing 2/2 hypoalbuminemia, as well as a CBC to r/o third spacing 2/2 anemia.  If renal function and electrolytes stable, consider short increase in diuresis followed by repeat BMET.  Given her bradycardia today versus junctional rhythm, and if no electrolyte abnormalities to correct on labs today, recommend decrease BB (lopressor recently increased) as well as eventual discontinuation of hydralazine given likely rebound tachycardia.  With these medication changes, she would likely need an additional antihypertensive agent to be determined pending labs and with slow transition / med changes. Between visits, both son and patient will monitor for any rebound sx with hydralazine, especially with skipped/missed/late doses. Further recommendations pending labs.    Bradycardia   H/o junctional bradycardia History of electrolyte abnormalities --Previous 09/2019 admission after episode of syncope with bradycardia that resulted in CPR and lengthy admission as outlined above.  Admitted 8/26 and found to have junctional bradycardia on EKG, which resolved after correction of significant electrolyte abnormalities and IV diuresis.  Limited echo performed to ensure no cardiac effusion given electrical alternans on telemetry and reassess pump function, R heart pressures, and r/o acute structural abnormalities. Repeat echo showed nl EF and right heart pressures without significant valvular abnormality. Today, EKG significant for bradycardic rate with significant artifact at baseline and rhythm consistent with junctional bradycardia versus SB with PACs.  As above, she has undergone multiple medication changes, including changing her bupropion to citalopram and addition of trazodone.  She reports intermittent dizziness, blurry vision, headache, fatigue, and paresthesias.  No recurrent syncope or falls.  If labs show stable renal function and electrolytes at goal, recommend  reduce current beta-blocker given bradycardia and transition off of hydralazine.  Also recommended is that she wean off of trazodone.   Anemia of chronic disease Iron deficiency anemia History of thrombocytosis -She has been taking both ASA and Eliquis 5 mg twice daily.  Given her history of AKI and risk of bleeding, as well as history of anemia, recommendation was that she discontinue ASA and continue on Eliquis. Will recheck a CBC given both anemia and thrombocytosis with h/o LUE thrombosis and known MGUS.  At this time, she does not meet reduced dosing criteria for Eliquis, given her weight is above 60 kg and most recent creatinine 0.87.  Continue  current dose of Eliquis.  Further recommendations, if indicated, pending repeat CBC.   Previous 11/17/2019 labs showed hemoglobin 9.8 with hematocrit 30.1 and RBC 3.17.  Baseline hemoglobin 8-9.  Platelets elevated at 618.  Continue to follow with nephrology, hematology.  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  Consider as contributing to her history of thrombosis as above.  Continue to follow with hematology and nephrology.  History of left subclavian clot/left upper extremity clot --Please see 09/2019 imaging as outlined above.  Placed on Eliquis for left subclavian clot.    She does not meet criteria for reduced dosing at this time.  Pending repeat CBC with recommendation to discontinue ASA, given her risk of bleeding on both ASA and Eliquis.  Consider ultrasound of left subclavian to reassess. Consider also L carotid US given bruit on exam and as unclear if radiation from valvular disease, UE thrombosis as noted above, or L carotid dz. At RTC, consider repeat carotids.   L bruit --As above.  Consider ultrasound of left subclavian to reassess. Unclear if bruit 2/2 radiation from valvular disease, UE thrombosis as noted above, or L carotid dz. At RTC, consider repeat carotids.  In the meantime, will check a direct LDL.  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  Received treatment for electrolyte abnormalities. She has a regular nephrologist.    She is also followed with [facility]. Consider as contributing to her presentation as well.  Anxiety --Her bupropion has been changed to citalopram and trazodone has been added given she has not been sleeping well.  Recommend transition off of trazodone given its tendency to prolong QTC and patient's current report of dizziness, as well as her age.  She wonders if her BP is elevated today 2/2 anxiety.  Treatment plan confusion  --In addition to switching PCPs, she has been followed by home health with reportedly labile blood pressures between different providers.  Home health records not available during visit today.  Multiple medication changes noted between providers and difficult to compile today.  Frustration regarding care noted by son.  Coordination and communication between providers encouraged.  Prior tobacco use COPD --Reports ongoing smoking cessation x3 months.  Congratulations provided today.  Consider COPD as contributing to her current presentation, as well as her chronic mild dyspnea noted for years.  HLD --We will check direct LDL today.  Further recommendations if indicated at that time.   Medication changes: None. Pending labs.  Medication changes recommended include decreasing beta-blocker given bradycardia and junctional rhythm, weaning off trazodone, and weaning off of hydralazine.  Discontinue ASA. Labs ordered: BNP, CMP, CBC, dLDL Studies / Imaging ordered: Consider repeat carotids/repeat ultrasound of subclavian.  Consider referral to [facility] for ongoing monitoring and management. Future considerations: As above, consider repeat carotid ultrasound/subclavian ultrasound.  Wean off of  hydralazine/trazodone as above and reduce beta-blocker. Disposition: RTC 2 weeks or sooner if indicated .    Arvil Chaco, PA-C 12/07/2019

## 2019-12-07 NOTE — Patient Instructions (Signed)
Medication Instructions:  Your physician recommends that you continue on your current medications as directed. Please refer to the Current Medication list given to you today.  *If you need a refill on your cardiac medications before your next appointment, please call your pharmacy*   Lab Work:  Labs will be drawn today: Direct LDL, CBC, CMP, BNP  If you have labs (blood work) drawn today and your tests are completely normal, you will receive your results only by: Marland Kitchen MyChart Message (if you have MyChart) OR . A paper copy in the mail If you have any lab test that is abnormal or we need to change your treatment, we will call you to review the results.   Testing/Procedures: None Ordered   Follow-Up: At [facility], you and your health needs are our priority.  As part of our continuing mission to provide you with exceptional heart care, we have created designated Provider Care Teams.  These Care Teams include your primary Cardiologist (physician) and Advanced Practice Providers (APPs -  Physician Assistants and Nurse Practitioners) who all work together to provide you with the care you need, when you need it.  We recommend signing up for the patient portal called "MyChart".  Sign up information is provided on this After Visit Summary.  MyChart is used to connect with patients for Virtual Visits (Telemedicine).  Patients are able to view lab/test results, encounter notes, upcoming appointments, etc.  Non-urgent messages can be sent to your provider as well.   To learn more about what you can do with MyChart, go to [email or web address].    Your next appointment:   2 week(s)  The format for your next appointment:   In Person  Provider:   You may see  Dr. Rockey Situ or one of the following Advanced Practice Providers on your designated Care Team:    Murray Hodgkins, NP  Christell Faith, PA-C  Marrianne Mood, PA-C  Cadence Kathlen Mody, [place]    Other Instructions    Tips to Measure  your Blood Pressure Correctly  To determine whether you have hypertension, a medical professional will take a blood pressure reading. How you prepare for the test, the position of your arm, and other factors can change a blood pressure reading by 10% or more. That could be enough to hide high blood pressure, start you on a drug you don't really need, or lead your doctor to incorrectly adjust your medications.  National and international guidelines offer specific instructions for measuring blood pressure. If a doctor, nurse, or medical assistant isn't doing it right, don't hesitate to ask him or her to get with the guidelines.  Here's what you can do to ensure a correct reading: . Don't drink a caffeinated beverage or smoke during the 30 minutes before the test. . Sit quietly for five minutes before the test begins. . During the measurement, sit in a chair with your feet on the floor and your arm supported so your elbow is at about heart level. . The inflatable part of the cuff should completely cover at least 80% of your upper arm, and the cuff should be placed on bare skin, not over a shirt. . Don't talk during the measurement. . Have your blood pressure measured twice, with a brief break in between. If the readings are different by 5 points or more, have it done a third time.  There are times to break these rules. If you sometimes feel lightheaded when getting out of bed in the morning or when  you stand after sitting, you should have your blood pressure checked while seated and then while standing to see if it falls from one position to the next.  Because blood pressure varies throughout the day, your doctor will rarely diagnose hypertension on the basis of a single reading. Instead, he or she will want to confirm the measurements on at least two occasions, usually within a few weeks of one another. The exception to this rule is if you have a blood pressure reading of 180/110 mm Hg or higher. A  result this high usually calls for prompt treatment.  It's a good idea to have your blood pressure measured in both arms at least once, since the reading in one arm (usually the right) may be higher than that in the left. A 2014 study in The American Journal of Medicine of nearly 3,400 people found average arm- to-arm differences in systolic blood pressure of about 5 points. The higher number should be used to make treatment decisions.  In 2017, new guidelines from the [employer], the [employer] of Cardiology, and nine other health organizations lowered the diagnosis of high blood pressure to 130/80 mm Hg or higher for all adults. The guidelines also redefined the various blood pressure categories to now include normal, elevated, Stage 1 hypertension, Stage 2 hypertension, and hypertensive crisis (see "Blood pressure categories").  Blood pressure categories  Blood pressure category SYSTOLIC (upper number)  DIASTOLIC (lower number)  Normal Less than 120 mm Hg and Less than 80 mm Hg  Elevated 120-129 mm Hg and Less than 80 mm Hg  High blood pressure: Stage 1 hypertension 130-139 mm Hg or 80-89 mm Hg  High blood pressure: Stage 2 hypertension 140 mm Hg or higher or 90 mm Hg or higher  Hypertensive crisis (consult your doctor immediately) Higher than 180 mm Hg and/or Higher than 120 mm Hg  Source: American Heart Association and American Stroke Association. For more on getting your blood pressure under control, buy Controlling Your Blood Pressure, a Special Health Report from [facility].   Blood Pressure Log   Date   Time  Blood Pressure  Position  Example: Nov 1 9 AM 124/78 sitting                                                     ** Your physician recommends that you weigh, daily, at the same time every day, and in the same amount of clothing. Please record your daily weights on the handout provided and bring it to your next  appointment.

## 2019-12-08 ENCOUNTER — Ambulatory Visit: Payer: Medicare HMO

## 2019-12-08 LAB — CBC
Hematocrit: 38.2 % (ref 34.0–46.6)
Hemoglobin: 12 g/dL (ref 11.1–15.9)
MCH: 30.8 pg (ref 26.6–33.0)
MCHC: 31.4 g/dL — ABNORMAL LOW (ref 31.5–35.7)
MCV: 98 fL — ABNORMAL HIGH (ref 79–97)
Platelets: 601 10*3/uL — ABNORMAL HIGH (ref 150–450)
RBC: 3.9 x10E6/uL (ref 3.77–5.28)
RDW: 16.8 % — ABNORMAL HIGH (ref 11.7–15.4)
WBC: 6.2 10*3/uL (ref 3.4–10.8)

## 2019-12-08 LAB — COMPREHENSIVE METABOLIC PANEL
ALT: 13 IU/L (ref 0–32)
AST: 13 IU/L (ref 0–40)
Albumin/Globulin Ratio: 2.5 — ABNORMAL HIGH (ref 1.2–2.2)
Albumin: 4.9 g/dL — ABNORMAL HIGH (ref 3.7–4.7)
Alkaline Phosphatase: 91 IU/L (ref 44–121)
BUN/Creatinine Ratio: 36 — ABNORMAL HIGH (ref 12–28)
BUN: 37 mg/dL — ABNORMAL HIGH (ref 8–27)
Bilirubin Total: 0.5 mg/dL (ref 0.0–1.2)
CO2: 23 mmol/L (ref 20–29)
Calcium: 10.6 mg/dL — ABNORMAL HIGH (ref 8.7–10.3)
Chloride: 96 mmol/L (ref 96–106)
Creatinine, Ser: 1.02 mg/dL — ABNORMAL HIGH (ref 0.57–1.00)
GFR calc Af Amer: 62 mL/min/{1.73_m2} (ref >59)
GFR calc non Af Amer: 54 mL/min/{1.73_m2} — ABNORMAL LOW (ref >59)
Globulin, Total: 2 g/dL (ref 1.5–4.5)
Glucose: 85 mg/dL (ref 65–99)
Potassium: 5.9 mmol/L — ABNORMAL HIGH (ref 3.5–5.2)
Sodium: 131 mmol/L — ABNORMAL LOW (ref 134–144)
Total Protein: 6.9 g/dL (ref 6.0–8.5)

## 2019-12-08 LAB — BRAIN NATRIURETIC PEPTIDE: BNP: 19.9 pg/mL (ref 0.0–100.0)

## 2019-12-08 LAB — LDL CHOLESTEROL, DIRECT: LDL Direct: 67 mg/dL (ref 0–99)

## 2019-12-09 DIAGNOSIS — I110 Hypertensive heart disease with heart failure: Secondary | ICD-10-CM | POA: Diagnosis not present

## 2019-12-09 DIAGNOSIS — I5033 Acute on chronic diastolic (congestive) heart failure: Secondary | ICD-10-CM | POA: Diagnosis not present

## 2019-12-09 DIAGNOSIS — D473 Essential (hemorrhagic) thrombocythemia: Secondary | ICD-10-CM | POA: Diagnosis not present

## 2019-12-09 DIAGNOSIS — N1330 Unspecified hydronephrosis: Secondary | ICD-10-CM | POA: Diagnosis not present

## 2019-12-09 DIAGNOSIS — D472 Monoclonal gammopathy: Secondary | ICD-10-CM | POA: Diagnosis not present

## 2019-12-09 DIAGNOSIS — D4959 Neoplasm of unspecified behavior of other genitourinary organ: Secondary | ICD-10-CM | POA: Diagnosis not present

## 2019-12-09 DIAGNOSIS — J441 Chronic obstructive pulmonary disease with (acute) exacerbation: Secondary | ICD-10-CM | POA: Diagnosis not present

## 2019-12-09 DIAGNOSIS — D509 Iron deficiency anemia, unspecified: Secondary | ICD-10-CM | POA: Diagnosis not present

## 2019-12-09 DIAGNOSIS — E871 Hypo-osmolality and hyponatremia: Secondary | ICD-10-CM | POA: Diagnosis not present

## 2019-12-11 ENCOUNTER — Telehealth: Payer: Self-pay | Admitting: Physician Assistant

## 2019-12-11 NOTE — Telephone Encounter (Signed)
I called and spoke with the patient's son, Venora Maples, regarding her lab results and Marrianne Mood, PA's recommendations:   1) CMP & BNP results:  The patient's son has been advised per Jacquelyn: - lasix should be 20 mg once daily as needed for weight gain > 3 lbs overnight - restrict fluid intake - monitor for s/s of low sodium (such as confusion) - repeat a CMET now , I did not mention to him she will need to have a BMP done in 1 week also.  Per Venora Maples, the patient does have [place] coming in to see her.  I advised I can call them to see if they can redraw her labs to recheck potassium ASAP. I called [facility] at [phone] and was directed to a voice mail for the nurse as she was tied up on the line. I left a message for Damita Dunnings to please call me back to see if we can arrange labs for now & 1 week. Awaiting a call back.   2) CBC results: - Venora Maples is aware I inquired which hematologist she follows with. Per Venora Maples, he was not very clear about this as he has recently become involved in taking over all of his mother's medical care. He was aware of some appointments at [facility], but he would like her to follow with hematology in the [facility] system in order to keep her care here. I advised I will see if Malachi Bonds is ok with [place] trying to refer her to a hematologist here. Venora Maples was very Patent attorney.   3) LDL results: Venora Maples aware and voiced understanding.  Venora Maples was concerned about the fact that "another doctor" had said she needed to take ASA, but we told her she did not. She does not have a diagnosis of PVD/ stroke. I advised Venora Maples, that I am uncertain why she would need ASA in addition to her Eliquis, but will clarify she should not be taking this.   Venora Maples is aware I will call him back once I can clarify: - can home health repeat labs - can we do a hematology referral - should she be off ASA

## 2019-12-11 NOTE — Telephone Encounter (Signed)
1) CMP & BNP results: Arvil Chaco, PA-C  12/10/2019 9:25 PM EDT     Please have Ms. Obara repeat her most recent blood work, as her potassium was elevated and we need to confirm this value is not due to lab error. She should repeat a CMP as soon as possible.    Arvil Chaco, PA-C  12/10/2019 9:54 PM EDT     Recommendation by Dr. Rockey Situ during her admission was for PRN lasix to be taken as needed for weight gain. She has been taking daily lasix.   Recommend PRN lasix 20mg  x1 for weight gain over 3 pounds.   Her sodium is low. She should restrict fluids, and her son should monitor for any signs of low sodium, such as confusion.   We need for her to repeat her CMP as soon as possible given the elevated potassium on her recent labs. This could be a lab error. We also need another BMET to occur in 1 week to check how she is doing on PRN lasix.      2) CBC results: Arvil Chaco, PA-C  12/10/2019 9:32 PM EDT     Hemoglobin is improved from previous labs, though her platelets remain low as they have in the past and for which she follows with hematology     3) LDL results: Arvil Chaco, PA-C  12/10/2019 9:28 PM EDT     Bad cholesterol (LDL) is at goal of below 70, which is good news.

## 2019-12-11 NOTE — Telephone Encounter (Signed)
Noted. Thank you for keeping me in the loop.

## 2019-12-11 NOTE — Telephone Encounter (Signed)
I received a call back from [place] with [facility]. She verified they can draw the patient's labs with a PRN visit this week. They will plan for a repeat CMET on Wednesday with a repeat BMET in 1 week- Wednesday 12/20/19.

## 2019-12-12 DIAGNOSIS — I110 Hypertensive heart disease with heart failure: Secondary | ICD-10-CM | POA: Diagnosis not present

## 2019-12-12 DIAGNOSIS — E871 Hypo-osmolality and hyponatremia: Secondary | ICD-10-CM | POA: Diagnosis not present

## 2019-12-12 DIAGNOSIS — I5033 Acute on chronic diastolic (congestive) heart failure: Secondary | ICD-10-CM | POA: Diagnosis not present

## 2019-12-12 DIAGNOSIS — N1330 Unspecified hydronephrosis: Secondary | ICD-10-CM | POA: Diagnosis not present

## 2019-12-12 DIAGNOSIS — D509 Iron deficiency anemia, unspecified: Secondary | ICD-10-CM | POA: Diagnosis not present

## 2019-12-12 DIAGNOSIS — D473 Essential (hemorrhagic) thrombocythemia: Secondary | ICD-10-CM | POA: Diagnosis not present

## 2019-12-12 DIAGNOSIS — D4959 Neoplasm of unspecified behavior of other genitourinary organ: Secondary | ICD-10-CM | POA: Diagnosis not present

## 2019-12-12 DIAGNOSIS — D472 Monoclonal gammopathy: Secondary | ICD-10-CM | POA: Diagnosis not present

## 2019-12-12 DIAGNOSIS — J441 Chronic obstructive pulmonary disease with (acute) exacerbation: Secondary | ICD-10-CM | POA: Diagnosis not present

## 2019-12-13 DIAGNOSIS — E871 Hypo-osmolality and hyponatremia: Secondary | ICD-10-CM | POA: Diagnosis not present

## 2019-12-13 DIAGNOSIS — I110 Hypertensive heart disease with heart failure: Secondary | ICD-10-CM | POA: Diagnosis not present

## 2019-12-13 DIAGNOSIS — J441 Chronic obstructive pulmonary disease with (acute) exacerbation: Secondary | ICD-10-CM | POA: Diagnosis not present

## 2019-12-13 DIAGNOSIS — N1330 Unspecified hydronephrosis: Secondary | ICD-10-CM | POA: Diagnosis not present

## 2019-12-13 DIAGNOSIS — I5033 Acute on chronic diastolic (congestive) heart failure: Secondary | ICD-10-CM | POA: Diagnosis not present

## 2019-12-13 DIAGNOSIS — D472 Monoclonal gammopathy: Secondary | ICD-10-CM | POA: Diagnosis not present

## 2019-12-13 DIAGNOSIS — D4959 Neoplasm of unspecified behavior of other genitourinary organ: Secondary | ICD-10-CM | POA: Diagnosis not present

## 2019-12-13 DIAGNOSIS — D473 Essential (hemorrhagic) thrombocythemia: Secondary | ICD-10-CM | POA: Diagnosis not present

## 2019-12-13 DIAGNOSIS — D509 Iron deficiency anemia, unspecified: Secondary | ICD-10-CM | POA: Diagnosis not present

## 2019-12-15 ENCOUNTER — Encounter: Payer: Self-pay | Admitting: Intensive Care

## 2019-12-15 ENCOUNTER — Other Ambulatory Visit: Payer: Self-pay

## 2019-12-15 ENCOUNTER — Telehealth: Payer: Self-pay

## 2019-12-15 ENCOUNTER — Emergency Department
Admission: EM | Admit: 2019-12-15 | Discharge: 2019-12-15 | Disposition: A | Discharge status: Home / Self Care | Payer: Medicare HMO | Attending: Emergency Medicine | Admitting: Emergency Medicine

## 2019-12-15 DIAGNOSIS — I509 Heart failure, unspecified: Secondary | ICD-10-CM | POA: Diagnosis not present

## 2019-12-15 DIAGNOSIS — J45909 Unspecified asthma, uncomplicated: Secondary | ICD-10-CM | POA: Diagnosis not present

## 2019-12-15 DIAGNOSIS — Z87891 Personal history of nicotine dependence: Secondary | ICD-10-CM | POA: Insufficient documentation

## 2019-12-15 DIAGNOSIS — Z79899 Other long term (current) drug therapy: Secondary | ICD-10-CM | POA: Insufficient documentation

## 2019-12-15 DIAGNOSIS — I10 Essential (primary) hypertension: Secondary | ICD-10-CM | POA: Diagnosis not present

## 2019-12-15 DIAGNOSIS — Z7901 Long term (current) use of anticoagulants: Secondary | ICD-10-CM | POA: Diagnosis not present

## 2019-12-15 DIAGNOSIS — Z7982 Long term (current) use of aspirin: Secondary | ICD-10-CM | POA: Insufficient documentation

## 2019-12-15 DIAGNOSIS — I110 Hypertensive heart disease with heart failure: Secondary | ICD-10-CM | POA: Insufficient documentation

## 2019-12-15 DIAGNOSIS — I959 Hypotension, unspecified: Secondary | ICD-10-CM | POA: Diagnosis not present

## 2019-12-15 DIAGNOSIS — R5381 Other malaise: Secondary | ICD-10-CM | POA: Diagnosis not present

## 2019-12-15 DIAGNOSIS — R457 State of emotional shock and stress, unspecified: Secondary | ICD-10-CM | POA: Diagnosis not present

## 2019-12-15 DIAGNOSIS — R799 Abnormal finding of blood chemistry, unspecified: Secondary | ICD-10-CM | POA: Diagnosis not present

## 2019-12-15 LAB — CBC
HCT: 33.4 % — ABNORMAL LOW (ref 36.0–46.0)
Hemoglobin: 11.7 g/dL — ABNORMAL LOW (ref 12.0–15.0)
MCH: 31.5 pg (ref 26.0–34.0)
MCHC: 35 g/dL (ref 30.0–36.0)
MCV: 89.8 fL (ref 80.0–100.0)
Platelets: 534 10*3/uL — ABNORMAL HIGH (ref 150–400)
RBC: 3.72 MIL/uL — ABNORMAL LOW (ref 3.87–5.11)
RDW: 16.7 % — ABNORMAL HIGH (ref 11.5–15.5)
WBC: 5.7 10*3/uL (ref 4.0–10.5)
nRBC: 0 % (ref 0.0–0.2)

## 2019-12-15 LAB — BASIC METABOLIC PANEL
Anion gap: 8 (ref 5–15)
BUN: 26 mg/dL — ABNORMAL HIGH (ref 8–23)
CO2: 24 mmol/L (ref 22–32)
Calcium: 9.7 mg/dL (ref 8.9–10.3)
Chloride: 102 mmol/L (ref 98–111)
Creatinine, Ser: 1.14 mg/dL — ABNORMAL HIGH (ref 0.44–1.00)
GFR calc Af Amer: 54 mL/min — ABNORMAL LOW (ref >60)
GFR calc non Af Amer: 47 mL/min — ABNORMAL LOW (ref >60)
Glucose, Bld: 114 mg/dL — ABNORMAL HIGH (ref 70–99)
Potassium: 4.6 mmol/L (ref 3.5–5.1)
Sodium: 134 mmol/L — ABNORMAL LOW (ref 135–145)

## 2019-12-15 LAB — TROPONIN I (HIGH SENSITIVITY): Troponin I (High Sensitivity): 7 ng/L (ref <18)

## 2019-12-15 NOTE — ED Provider Notes (Signed)
[facility] Emergency Department Provider Note  ____________________________________________   First MD Initiated Contact with Patient 12/15/19 1702     (approximate)  I have reviewed the triage vital signs and the nursing notes.   HISTORY  Chief Complaint Abnormal Lab   HPI Pamela James is a 74 y.o. female with a past medical history of anxiety, asthma, CHF, COPD, depression, GERD, and HTN who presents for assessment of concern for elevated potassium that was drawn on a routine outpatient basis 2 days ago and resulted earlier today.  Patient states she is currently asymptomatic denies any chest pain, cough, shortness of breath, dizziness, headache, abdominal pain, nausea, vomiting, diarrhea, dysuria, rash, recent falls or injuries, or other acute complaints.  Denies any change in p.o. intake or urine output.  Denies any recent changes to her medications and states she takes everything as directed.  She does note she did have a cardiac arrest secondary to elevated high potassium in the past does not feel at all like she did then.  Denies tobacco abuse, EtOH use, illicit drug use.  I also spoke with the patient's son who confirmed with his noted to have very high potassium on outpatient labs this week.         Past Medical History:  Diagnosis Date  . Anxiety   . Asthma   . CHF (congestive heart failure) ([place])   . COPD (chronic obstructive pulmonary disease) ([place])   . Depression   . GERD (gastroesophageal reflux disease)   . Hypertension     Patient Active Problem List   Diagnosis Date Noted  . Obstructive uropathy 11/10/2019  . Hydronephrosis 11/10/2019  . Pulmonary edema 11/10/2019  . Acute hypoxemic respiratory failure ([place]) 11/09/2019  . Acute congestive heart failure ([place])   . AKI (acute kidney injury) ([place])   . Junctional bradycardia   . Hyponatremia   . Hyperkalemia   . Acute on chronic respiratory failure with hypoxia ([place]) 09/15/2019  .  COPD exacerbation ([place]) 11/21/2017  . Acute bronchitis 03/26/2016  . Essential hypertension 03/26/2016  . Chest pain 03/25/2016    Past Surgical History:  Procedure Laterality Date  . ABDOMINAL HYSTERECTOMY    . TOTAL HIP ARTHROPLASTY Left   . TUBAL LIGATION      Prior to Admission medications   Medication Sig Start Date End Date Taking? Authorizing Provider  albuterol (PROVENTIL HFA) 108 (90 Base) MCG/ACT inhaler Inhale 2 puffs into the lungs every 4 (four) hours as needed for wheezing or shortness of breath. 11/17/19   Carrie Mew, MD  albuterol (PROVENTIL) (2.5 MG/3ML) 0.083% nebulizer solution Take 2.5 mg by nebulization every 6 (six) hours as needed for wheezing or shortness of breath.    [provider]  alendronate (FOSAMAX) 70 MG tablet Take 70 mg by mouth once a week. Take with a full glass of water on an empty stomach.    [provider]  amLODipine (NORVASC) 10 MG tablet Take 10 mg by mouth daily.    [provider]  apixaban (ELIQUIS) 5 MG TABS tablet Take 1 tablet (5 mg total) by mouth 2 (two) times daily. 09/29/19   Ezekiel Slocumb, DO  aspirin EC 81 MG EC tablet Take 1 tablet (81 mg total) by mouth daily. 03/27/16   Hillary Bow, MD  bisacodyl (DULCOLAX) 5 MG EC tablet Take by mouth.    [provider]  citalopram (CELEXA) 10 MG tablet Take 10 mg by mouth daily.  12/06/19 12/05/20  [provider]  Docusate Sodium (DSS) 100 MG CAPS Take by mouth daily as needed.     [provider]  feeding supplement, ENSURE ENLIVE, (ENSURE ENLIVE) LIQD Take 237 mLs by mouth daily. 09/27/19   Nicole [facility] A, DO  ferrous sulfate 325 (65 FE) MG EC tablet Take 1 tablet (325 mg total) by mouth 2 (two) times daily. 11/11/19 05/09/20  Mercy Riding, MD  fluticasone-salmeterol (ADVAIR HFA) 230-21 MCG/ACT inhaler Inhale 2 puffs into the lungs 2 (two) times daily.    [provider]  furosemide (LASIX) 20 MG tablet Take 1 tablet (20  mg total) by mouth daily. 11/13/19 05/11/20  Mercy Riding, MD  gabapentin (NEURONTIN) 300 MG capsule Take 300 mg by mouth daily.     [provider]  hydrALAZINE (APRESOLINE) 25 MG tablet Take 25 mg by mouth in the morning and at bedtime.    [provider]  Ipratropium-Albuterol (COMBIVENT) 20-100 MCG/ACT AERS respimat Inhale 1 puff into the lungs 4 (four) times daily as needed for wheezing or shortness of breath. 11/11/19   Mercy Riding, MD  lidocaine (LIDODERM) 5 % Place 1 patch onto the skin daily. Remove & Discard patch within 12 hours or as directed by MD 09/28/19   Nicole [facility] A, DO  metoprolol tartrate (LOPRESSOR) 25 MG tablet Take 0.5 tablets (12.5 mg total) by mouth 2 (two) times daily. Patient taking differently: Take 25 mg by mouth 2 (two) times daily.  11/11/19   Mercy Riding, MD  nitroGLYCERIN (NITROSTAT) 0.4 MG SL tablet Place 1 tablet (0.4 mg total) under the tongue every 5 (five) minutes as needed for chest pain. 09/27/19   Ezekiel Slocumb, DO  omeprazole (PRILOSEC) 20 MG capsule Take 20 mg by mouth daily.    [provider]  pravastatin (PRAVACHOL) 20 MG tablet Take 20 mg by mouth at bedtime.     [provider]  senna-docusate (SENOKOT-S) 8.6-50 MG tablet Take 1 tablet by mouth 2 (two) times daily between meals as needed for mild constipation. 11/11/19   Mercy Riding, MD  tamsulosin (FLOMAX) 0.4 MG CAPS capsule Take 0.4 mg by mouth daily.    [provider]  traZODone (DESYREL) 100 MG tablet Take 50-100 mg by mouth at bedtime. 11/06/19   [provider]  umeclidinium bromide (INCRUSE ELLIPTA) 62.5 MCG/INH AEPB Inhale 1 puff into the lungs daily. 11/11/19   Mercy Riding, MD  vitamin B-12 (CYANOCOBALAMIN) 1000 MCG tablet Take 1,000 mcg by mouth daily.    [provider]    Allergies Morphine and related, Nsaids, and Aspirin  Family History  Problem Relation Age of Onset  . Heart disease Father   . Heart disease  Sister     Social History Social History   Tobacco Use  . Smoking status: Former Smoker    Packs/day: 0.75    Years: 40.00    Pack years: 30.00    Types: Cigarettes    Quit date: 11/05/2019    Years since quitting: 0.1  . Smokeless tobacco: Never Used  Vaping Use  . Vaping Use: Never used  Substance Use Topics  . Alcohol use: No  . Drug use: No    Review of Systems  Review of Systems  Constitutional: Negative for chills and fever.  HENT: Negative for sore throat.   Eyes: Negative for pain.  Respiratory: Negative for cough and stridor.   Cardiovascular: Negative for chest pain.  Gastrointestinal: Negative for vomiting.  Genitourinary: Negative for dysuria and frequency.  Musculoskeletal: Negative for myalgias.  Skin: Negative for rash.  Neurological: Negative for seizures, loss of consciousness and headaches.  Psychiatric/Behavioral: Negative for suicidal ideas.  All other systems reviewed and are negative.     ____________________________________________   PHYSICAL EXAM:  VITAL SIGNS: ED Triage Vitals  Enc Vitals Group     BP 12/15/19 1040 (!) 153/55     Pulse Rate 12/15/19 1040 69     Resp 12/15/19 1040 16     Temp 12/15/19 1040 98 F (36.7 C)     Temp Source 12/15/19 1040 Oral     SpO2 12/15/19 1040 99 %     Weight 12/15/19 1041 147 lb (66.7 kg)     Height 12/15/19 1041 5\' 3"  (1.6 m)     Head Circumference --      Peak Flow --      Pain Score 12/15/19 1041 0     Pain Loc --      Pain Edu? --      Excl. in [place]? --    Vitals:   12/15/19 1256 12/15/19 1522  BP: (!) 168/67 (!) 158/56  Pulse: 70 63  Resp: 16 16  Temp:    SpO2: 100% 100%   Physical Exam Vitals and nursing note reviewed.  Constitutional:      General: She is not in acute distress.    Appearance: She is well-developed.  HENT:     Head: Normocephalic and atraumatic.     Right Ear: External ear normal.     Left Ear: External ear normal.     Nose: Nose normal.     Mouth/Throat:       Mouth: Mucous membranes are moist.  Eyes:     Conjunctiva/sclera: Conjunctivae normal.  Cardiovascular:     Rate and Rhythm: Normal rate and regular rhythm.     Heart sounds: No murmur heard.   Pulmonary:     Effort: Pulmonary effort is normal. No respiratory distress.     Breath sounds: Normal breath sounds.  Abdominal:     Palpations: Abdomen is soft.     Tenderness: There is no abdominal tenderness.  Musculoskeletal:     Cervical back: Neck supple.  Skin:    General: Skin is warm and dry.  Neurological:     Mental Status: She is alert and oriented to person, place, and time.  Psychiatric:        Mood and Affect: Mood normal.      ____________________________________________   LABS (all labs ordered are listed, but only abnormal results are displayed)  Labs Reviewed  BASIC METABOLIC PANEL - Abnormal; Notable for the following components:      Result Value   Sodium 134 (*)    Glucose, Bld 114 (*)    BUN 26 (*)    Creatinine, Ser 1.14 (*)    GFR calc non Af Amer 47 (*)    GFR calc Af Amer 54 (*)    All other components within normal limits  CBC - Abnormal; Notable for the following components:   RBC 3.72 (*)    Hemoglobin 11.7 (*)    HCT 33.4 (*)    RDW 16.7 (*)    Platelets 534 (*)    All other components within normal limits  TROPONIN I (HIGH SENSITIVITY)  TROPONIN I (HIGH SENSITIVITY)   ____________________________________________  EKG  Sinus rhythm with a ventricular rate of 65 and somewhat poor tracing in V5 and V6  with nonspecific changes in inferior leads. ____________________________________________  ____________________________________________   PROCEDURES  Procedure(s) performed (including Critical Care):  Procedures   ____________________________________________   INITIAL IMPRESSION / ASSESSMENT AND PLAN / ED COURSE        Patient presents with above to history exam for assessment after outpatient lab resulted with elevated  potassium reportedly at 7.8.  On arrival patient is hypertensive otherwise stable vital signs.  She denies any acute symptoms or change in her medications or change in p.o. intake or urine output.  BMP shows potassium of 4.6 and baseline creatinine.  CBC and troponin are unremarkable.  Overall is unclear if this lab obtained 2 days ago represents true hyperkalemia that self resolved versus hemolysis.  Patient does not have evidence of hyperkalemia today or other immediate life-threatening process including significant metabolic derangements or renal failure.  No history or exam findings to suggest infectious process, traumatic injury, or ACS.  Discussed these findings with patient's son and he will follow up with patient's PCP on outpatient basis.  Patient discharged stable condition.  Strict return precautions advised and discussed.   ____________________________________________   FINAL CLINICAL IMPRESSION(S) / ED DIAGNOSES  Final diagnoses:  Labor abnormal    Medications - No data to display   ED Discharge Orders    None       Note:  This document was prepared using Dragon voice recognition software and may include unintentional dictation errors.   Lucrezia Starch, MD 12/15/19 [phone]

## 2019-12-15 NOTE — Telephone Encounter (Signed)
[facility] at home calling with critical results.   Potassium 7.8 Glucose 36

## 2019-12-15 NOTE — ED Triage Notes (Signed)
Pt in via EMS from home with c/o abnormal labs. Pt with elevated K+. Pt with hx of cardiac arrest in the past and was told it was due to a high potassium level.

## 2019-12-15 NOTE — Telephone Encounter (Signed)
Patient son calling to discuss below.    He states the patient in en route to ed via ambulance but is crying and upset and will not be able to go by herself given her cognitive issues.   Spoke with Anne Ng in the [facility] ed and was directed to notify son that he can wait in the ed with the patient .  Instructed the patient son further to come into ed leave number and wait for a call to come back in after the patient was screened .   Son appreciates the assistance and help with below.  He will call if further assistance needed.

## 2019-12-15 NOTE — ED Triage Notes (Signed)
Patient arrived by EMS for abnormal levels of high K. HX per son of cardiac arrest in ER from high potassium level. Per son his mother has intermittent confusion.

## 2019-12-15 NOTE — Telephone Encounter (Signed)
Spoke with Colletta Maryland the Publishing copy for [facility]. The patient had a bmet drawn by [facility] on 12/13/19. Colletta Maryland sts that she received a call from [facility] this morning to give critical lab values. She is not sure why they were not notified sooner by [facility].  Potassium 7.8 Glucose 36  The nurse was out to the patients home on 12/13/19 and the patient was asymptomatic.  Review of the patients medications there are no meds that should be causing the elevated potassium.  Reviewed with the office DOD Dr. Garen Lah. Per Dr. Garen Lah the patient needs to be seen in the ED asap  Spoke with Colletta Maryland at [facility] and made her aware.  Contacted the patients son Venora Maples. Adv him of the patients results and Dr. Thereasa Solo recommendation. Venora Maples is currently at work and the patient is at home alone. Celedonio Savage that EMS should be called to transport the patient to the ED asap. Offered to call the patient directly, Venora Maples sts that he would like to call her instead. Venora Maples sts that he will call EMS now, contact the patient to make her aware, he is leaving work now to be with her.  Venora Maples voiced his frustration with the delay of being notified of the patient results. Adv him that unfortunately we were just notified of the results this morning.

## 2019-12-15 NOTE — Telephone Encounter (Signed)
Patient currently in the ED.

## 2019-12-17 DIAGNOSIS — N1330 Unspecified hydronephrosis: Secondary | ICD-10-CM | POA: Diagnosis not present

## 2019-12-17 DIAGNOSIS — D473 Essential (hemorrhagic) thrombocythemia: Secondary | ICD-10-CM | POA: Diagnosis not present

## 2019-12-17 DIAGNOSIS — I5033 Acute on chronic diastolic (congestive) heart failure: Secondary | ICD-10-CM | POA: Diagnosis not present

## 2019-12-17 DIAGNOSIS — E871 Hypo-osmolality and hyponatremia: Secondary | ICD-10-CM | POA: Diagnosis not present

## 2019-12-17 DIAGNOSIS — D4959 Neoplasm of unspecified behavior of other genitourinary organ: Secondary | ICD-10-CM | POA: Diagnosis not present

## 2019-12-17 DIAGNOSIS — J441 Chronic obstructive pulmonary disease with (acute) exacerbation: Secondary | ICD-10-CM | POA: Diagnosis not present

## 2019-12-17 DIAGNOSIS — D472 Monoclonal gammopathy: Secondary | ICD-10-CM | POA: Diagnosis not present

## 2019-12-17 DIAGNOSIS — I110 Hypertensive heart disease with heart failure: Secondary | ICD-10-CM | POA: Diagnosis not present

## 2019-12-17 DIAGNOSIS — D509 Iron deficiency anemia, unspecified: Secondary | ICD-10-CM | POA: Diagnosis not present

## 2019-12-20 DIAGNOSIS — I5033 Acute on chronic diastolic (congestive) heart failure: Secondary | ICD-10-CM | POA: Diagnosis not present

## 2019-12-20 DIAGNOSIS — J441 Chronic obstructive pulmonary disease with (acute) exacerbation: Secondary | ICD-10-CM | POA: Diagnosis not present

## 2019-12-20 DIAGNOSIS — D509 Iron deficiency anemia, unspecified: Secondary | ICD-10-CM | POA: Diagnosis not present

## 2019-12-20 DIAGNOSIS — E871 Hypo-osmolality and hyponatremia: Secondary | ICD-10-CM | POA: Diagnosis not present

## 2019-12-20 DIAGNOSIS — D473 Essential (hemorrhagic) thrombocythemia: Secondary | ICD-10-CM | POA: Diagnosis not present

## 2019-12-20 DIAGNOSIS — I110 Hypertensive heart disease with heart failure: Secondary | ICD-10-CM | POA: Diagnosis not present

## 2019-12-20 DIAGNOSIS — N1330 Unspecified hydronephrosis: Secondary | ICD-10-CM | POA: Diagnosis not present

## 2019-12-20 DIAGNOSIS — D4959 Neoplasm of unspecified behavior of other genitourinary organ: Secondary | ICD-10-CM | POA: Diagnosis not present

## 2019-12-20 DIAGNOSIS — D472 Monoclonal gammopathy: Secondary | ICD-10-CM | POA: Diagnosis not present

## 2019-12-21 ENCOUNTER — Ambulatory Visit (INDEPENDENT_AMBULATORY_CARE_PROVIDER_SITE_OTHER): Payer: Medicare HMO | Admitting: Physician Assistant

## 2019-12-21 ENCOUNTER — Other Ambulatory Visit: Payer: Self-pay

## 2019-12-21 ENCOUNTER — Encounter: Payer: Self-pay | Admitting: Physician Assistant

## 2019-12-21 VITALS — BP 170/50 | HR 74 | Ht 63.0 in | Wt 151.0 lb

## 2019-12-21 DIAGNOSIS — Z8639 Personal history of other endocrine, nutritional and metabolic disease: Secondary | ICD-10-CM

## 2019-12-21 DIAGNOSIS — E785 Hyperlipidemia, unspecified: Secondary | ICD-10-CM | POA: Diagnosis not present

## 2019-12-21 DIAGNOSIS — Z87891 Personal history of nicotine dependence: Secondary | ICD-10-CM

## 2019-12-21 DIAGNOSIS — Z79899 Other long term (current) drug therapy: Secondary | ICD-10-CM

## 2019-12-21 DIAGNOSIS — Z87898 Personal history of other specified conditions: Secondary | ICD-10-CM | POA: Diagnosis not present

## 2019-12-21 DIAGNOSIS — J449 Chronic obstructive pulmonary disease, unspecified: Secondary | ICD-10-CM | POA: Diagnosis not present

## 2019-12-21 DIAGNOSIS — E875 Hyperkalemia: Secondary | ICD-10-CM | POA: Diagnosis not present

## 2019-12-21 DIAGNOSIS — D472 Monoclonal gammopathy: Secondary | ICD-10-CM | POA: Diagnosis not present

## 2019-12-21 DIAGNOSIS — I771 Stricture of artery: Secondary | ICD-10-CM

## 2019-12-21 DIAGNOSIS — G458 Other transient cerebral ischemic attacks and related syndromes: Secondary | ICD-10-CM

## 2019-12-21 DIAGNOSIS — I5031 Acute diastolic (congestive) heart failure: Secondary | ICD-10-CM | POA: Diagnosis not present

## 2019-12-21 DIAGNOSIS — I5032 Chronic diastolic (congestive) heart failure: Secondary | ICD-10-CM | POA: Diagnosis not present

## 2019-12-21 MED ORDER — AMLODIPINE BESYLATE 10 MG PO TABS
5.0000 mg | ORAL_TABLET | Freq: Two times a day (BID) | ORAL | 3 refills | Status: DC | PRN
Start: 2019-12-21 — End: 2020-08-19

## 2019-12-21 NOTE — Patient Instructions (Addendum)
Make sure to check Blood Pressures 2 hours after your medications.   Please keep a log of your readings so we can see how they trend.  Avoid these things for 30 minutes before checking your blood pressure:  Drinking caffeine.  Drinking alcohol.  Eating.  Smoking.  Exercising.   Five minutes before checking your blood pressure:  Pee.  Sit in a dining chair. Avoid sitting in a soft couch or armchair.  Be quiet. Do not talk.  Medication Instructions:  Your physician has recommended you make the following change in your medication:   1. Take Hydralazine 25 mg twice a day 2. AS NEEDED amlodipine 5 mg twice a day and hold if top blood pressure number is less than 120   *If you need a refill on your cardiac medications before your next appointment, please call your pharmacy*   Lab Work: [facility]  If you have labs (blood work) drawn today and your tests are completely normal, you will receive your results only by: Marland Kitchen MyChart Message (if you have MyChart) OR . A paper copy in the mail If you have any lab test that is abnormal or we need to change your treatment, we will call you to review the results.   Testing/Procedures: Your physician has requested that you have a carotid duplex. This test is an ultrasound of the carotid arteries in your neck. It looks at blood flow through these arteries that supply the brain with blood. Allow one hour for this exam. There are no restrictions or special instructions.  Your physician has requested that you have an echocardiogram. Echocardiography is a painless test that uses sound waves to create images of your heart. It provides your doctor with information about the size and shape of your heart and how well your heart's chambers and valves are working. This procedure takes approximately one hour. There are no restrictions for this procedure.     Follow-Up: At [facility], you and your health needs are our priority.  As part of our  continuing mission to provide you with exceptional heart care, we have created designated Provider Care Teams.  These Care Teams include your primary Cardiologist (physician) and Advanced Practice Providers (APPs -  Physician Assistants and Nurse Practitioners) who all work together to provide you with the care you need, when you need it.   Your next appointment:   1 month(s)  The format for your next appointment:   In Person  Provider:   You may see Ida Rogue, MD or one of the following Advanced Practice Providers on your designated Care Team:    Murray Hodgkins, NP  Christell Faith, PA-C  Marrianne Mood, PA-C  Cadence Kathlen Mody, [place]    How to Take Your Blood Pressure You can take your blood pressure at home with a machine. You may need to check your blood pressure at home:  To check if you have high blood pressure (hypertension).  To check your blood pressure over time.  To make sure your blood pressure medicine is working. Supplies needed: You will need a blood pressure machine, or monitor. You can buy one at a drugstore or online. When choosing one:  Choose one with an arm cuff.  Choose one that wraps around your upper arm. Only one finger should fit between your arm and the cuff.  Do not choose one that measures your blood pressure from your wrist or finger. Your doctor can suggest a monitor. How to prepare Avoid these things for 30  minutes before checking your blood pressure:  Drinking caffeine.  Drinking alcohol.  Eating.  Smoking.  Exercising. Five minutes before checking your blood pressure:  Pee.  Sit in a dining chair. Avoid sitting in a soft couch or armchair.  Be quiet. Do not talk. How to take your blood pressure Follow the instructions that came with your machine. If you have a digital blood pressure monitor, these may be the instructions: 1. Sit up straight. 2. Place your feet on the floor. Do not cross your ankles or legs. 3. Rest your  left arm at the level of your heart. You may rest it on a table, desk, or chair. 4. Pull up your shirt sleeve. 5. Wrap the blood pressure cuff around the upper part of your left arm. The cuff should be 1 inch (2.5 cm) above your elbow. It is best to wrap the cuff around bare skin. 6. Fit the cuff snugly around your arm. You should be able to place only one finger between the cuff and your arm. 7. Put the cord inside the groove of your elbow. 8. Press the power button. 9. Sit quietly while the cuff fills with air and loses air. 10. Write down the numbers on the screen. 11. Wait 2-3 minutes and then repeat steps 1-10. What do the numbers mean? Two numbers make up your blood pressure. The first number is called systolic pressure. The second is called diastolic pressure. An example of a blood pressure reading is "120 over 80" (or 120/80). If you are an adult and do not have a medical condition, use this guide to find out if your blood pressure is normal: Normal  First number: below 120.  Second number: below 80. Elevated  First number: 120-129.  Second number: below 80. Hypertension stage 1  First number: 130-139.  Second number: 80-89. Hypertension stage 2  First number: 140 or above.  Second number: 31 or above. Your blood pressure is above normal even if only the top or bottom number is above normal. Follow these instructions at home:  Check your blood pressure as often as your doctor tells you to.  Take your monitor to your next doctor's appointment. Your doctor will: ? Make sure you are using it correctly. ? Make sure it is working right.  Make sure you understand what your blood pressure numbers should be.  Tell your doctor if your medicines are causing side effects. Contact a doctor if:  Your blood pressure keeps being high. Get help right away if:  Your first blood pressure number is higher than 180.  Your second blood pressure number is higher than 120. This  information is not intended to replace advice given to you by your health care provider. Make sure you discuss any questions you have with your health care provider. Document Revised: 02/12/2017 Document Reviewed: 08/09/2015 Elsevier Patient Education  2020 [employer].

## 2019-12-21 NOTE — Progress Notes (Signed)
Office Visit    Patient Name: Pamela James Date of Encounter: 12/21/2019  Primary Care Provider:  Tracie Harrier, MD Primary Cardiologist:  Ida Rogue, MD  Chief Complaint    Chief Complaint  Patient presents with  . Other    2 week follow up. Meds reviewed verbally with patient.     74 y.o. 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  Past Medical History    Past Medical History:  Diagnosis Date  . Anxiety   . Asthma   . CHF (congestive heart failure) ([place])   . COPD (chronic obstructive pulmonary disease) ([place])   . Depression   . GERD (gastroesophageal reflux disease)   . Hypertension    Past Surgical History:  Procedure Laterality Date  . ABDOMINAL HYSTERECTOMY    . TOTAL HIP ARTHROPLASTY Left   . TUBAL LIGATION      Allergies  Allergies  Allergen Reactions  . Morphine And Related Other (See Comments)    Delerium, itching  . Nsaids Other (See Comments)    GI ulcers  . Aspirin Other (See Comments)    GI ulcers Can tolerate 81 mg daily    History of Present Illness    Pamela James is a 74 y.o. female with PMH as above.   Pamela James is a 74 year old female with PMH as above including hyperlipidemia, hypertension, hyperparathyroidism, anxiety/depression/bipolar, COPD, asthma, GERD, MGUS, left hydronephrosis s/p left ureteroscopy on 09/12/2019 with stent and tumor biopsy.   She has been followed closely by urology for gross hematuria and left-sided hydronephrosis.  She had a cytourethroscopy done 09/12/2019 and stent placement/biopsy of the ureter stent.  She has history of left ureteral/renal pelvic tumor in the  past.  She follows with hematology for thrombocytosis and MGUS.   She was admitted 09/15/2019-09/27/19 to [facility] after an episode of syncope with bradycardia that resulted in CPR and admission to [facility]. Hospitalization was also complicated by aspiration pneumonia with Unasyn, pulmonary edema requiring IV Lasix, left subclavian clot, hypertensive urgency, ?alcohol withdrawal, metabolic encephalopathy, and bradycardia/hypotension. At presentation, she was found to be in acute hypoxic respiratory failure, intubated, and admitted to ICU .  She was hypotensive and bradycardic, requiring Precedex and dopamine drips.   She was started on Eliquis for left subclavian clot.  She was extubated and transferred out of the ICU; however, due to hypertensive urgency, she required transfer back to the ICU for nicardipine drip.  She had pulmonary vascular congestion and received IV Lasix with subsequent improvement in respiratory status and placed back on room air.  She was evaluated by urology with notes indicating hematuria likely secondary to stenting and potential bleeding from ureteral tumor.  She was discharged home to live with her son 09/27/2019.  Of note, 09/2019 echo with normal EF and as copied below.   Also of note, 09/16/2019 ultrasound of the upper left extremity showed partially occluded thrombus of left subclavian vein.  She also has occlusive thrombus of the axillary vein that is noncompressible, occlusive thrombus of the cephalic vein, which is also noncompressible, occlusive thrombus of the brachial veins, occlusive thrombus of the radial veins, occlusive thrombus of the ulnar veins.  Overall impression is left upper extremity positive for DVT  of the subclavian vein, axillary vein, and paired brachial veins.  Occlusive of the axillary paired brachial veins, and nonocclusive at the subclavian vein.  Superficial occlusive thrombophlebitis of the cephalic vein, basilic vein, radial vein, and ulnar vein.  She was  seen in follow-up by Dr. Tressia Miners 09/25/2019.  She reported chest pain 2/2 MSK from compressions.  She reported low-grade fevers at home.  BP was well controlled.  At subsequent follow-up, she was eating bacon and watermelon with extra salt on it.  BP 200/68.  Low-salt diet was advised.  She was continued on Norvasc, lisinopril, metoprolol, and hydralazine.  Potassium was high with possible discontinuation of lisinopril noted pending repeat labs.  She was still smoking and noted nicotine patches were too expensive.  Bupropion was started and she was continued on inhalers. She was on oxybutynin and Flomax with known left ureteral obstruction and hematuria, followed closely by urology.   On 11/09/2019, she presented to the emergency department after feeling increasing shortness of breath, orthopnea, PND, and weakness over the last few days.  In the ED, she was found to be hypoxic with saturations down to 85% and placed on 2 L nasal cannula oxygen.  She was also noted to be volume overloaded and hyperkalemic with AKI and ARF in the setting of obstructive nephropathy with severe left hydronephrosis.  Vitals significant for HR 42 to 51 bpm.  Labs showed BNP 860, sodium 121, potassium 6.4, creatinine 1.67 (baseline 0.83).  HS Tn 18 x2. CT renal stone study showed removed left renal stent and new moderately severe left hydronephrosis.  It was noted she had been followed by [facility], dating back to March 2021 for moderate to severe left hydronephrosis.  Imaging 10/2019 also showed attenuation with mid and distal left ureter was thought due to hemorrhage by tumor within the ureter.  Small bilateral pleural effusions were noted, as well as improved bibasilar airspace disease and right renal scarring with 3 nonobstructing stones.  She was started on IV Lasix 20 mg, IV calcium gluconate, bicarb, dextrose with insulin, veltassa. Due to ATN and obstructive uropathy, foley catheter was placed on arrival. At presentation,  intermittent bradycardia was noted with atropine administered.  EKG at presentation showed junctional rhythm at 46 bpm, and nonspecific ST abnormality.  Cardiology was consulted and thought junctional rhythm most likely due to to electrolyte abnormality with recommendation to repeat electrolytes with suspected improvement in the her rhythm. With improved electrolytes (hyponatremia, hyperkalemia), it was noted that repeat EKG also showed improved rate and resolution of junctional rhythm.  Limited echo was performed due to electrical alternans on telemetry and echo as below and showed EF 60 to 65%, no regional wall motion abnormalities, mild LVH, indeterminate diastolic parameters. Right heart pressures did not appear elevated and pulmonary edema was thus thought 2/2 hydronephrosis and obstructive uropathy as cardiac workup was relatively unrevealing. PRN lasix was provided for wt gain, as it was not felt she needed daily diuresis. Smoking cessation was advised, as well as follow-up with pulmonology.   She was in the ED 11/17/2019 due to dizziness and shortness of breath.  Chest x-ray showed COPD.  EKG was without ischemic ST/T changes.  Labs unremarkable.  She received a breathing treatment with DuoNeb improvement of symptoms.  Repeat exam was without significant findings and lungs clear to auscultation.  She was able to ambulate for 5 minutes with normal oxygen saturations and no acute symptoms.  She was treated with prednisone and antibiotics/doxycycline.  She was seen  by the heart failure clinic 11/23/2019.  At that time, she reported minimal shortness of breath upon moderate exertion.  She described her shortness of breath/dyspnea is chronic in nature and persistent for several years.  She noted palpitations with her dyspnea.  She denied any other additional symptoms, including difficulty sleeping, dizziness, fatigue, abdominal distention, pedal edema, chest pain, and cough.  She reported having a home scale but  not weighing herself daily.  BP 153/53 with pulse 46 bpm.  She reported elevated BP in the setting of anxiety.  SPO2 97% and weight 150 pounds.  She was instructed to weigh herself daily and call for overnight weight gain of greater than 2 pounds or weekly weight gain of greater than 5 pounds.  Mrs. Deliah Boston was recommended and further dietary instructions reviewed.  It was recommended to consider changing her lisinopril to [facility] in the future.  She reported receiving both COVID-19 vaccines.  She had recently stopped smoking and reported that she was still using nebulizers and inhalers during the day.  She was seen by her PCP on 11/29/2019.  At that time, BP 104/50 with HR 68 bpm.  Weight 151 pounds.  It was noted that she was set up with home health.  She reported trouble with her memory ever since her previous lengthy hospitalization 09/2019 with acute hypoxic respiratory failure with aspiration pneumonia, pulmonary edema, left subclavian clot, and metabolic encephalopathy.  Per review of notes, the patient son was with her at the time of this office visit and reported frustration regarding his mother's condition and lack of coordination between home health nurse and the physician.  He was frustrated that she had been hospitalized twice since her last visit.  He was frustrated regarding the discrepancy in blood pressure in both arms, as blood pressure was always lower on the left side.  Per patient, her main report was feeling strange.  She reported a pressure in her chest when ambulating and also occasional numbness and pain along her left arm.  She also noted depression, as she was unable to be outdoors and do a lot of things that she had done before in the past.  Of note, after that time, she switched PCPs from Dr. Tressia Miners to Dr. Ginette Pitman.  She was seen by her new PCP, Dr. Ginette Pitman, 9/22. Wellbutrin was discontinued and she was started on citalopram 10 mg p.o. daily and trazodone for her anxiety.  She was  instructed to increase fluid intake with review of recommendation for fluid restriction yet also maintaining hydration reviewed in detail.  She also received her flu shot at that time (9/22).  BP 110/78 with HR 67 bpm and SPO2 97%.  Weight 150 pounds.   On 12/07/19, she presented to clinic with her son and noted labile pressures, ongoing palpitations, headache, occasional dizziness, and blurry vision.  This usually occured 3-4 times per week and in the setting of elevated blood pressure.  She also noted fatigue, paresthesias, and overall decreased energy.  She reported her PCP instructed her to hold hydralazine for SBP 110 or below. Lasix had been increased to lasix 20mg  daily. She was using breathing treatments.  She was using Mrs. Dash, though reportedly in very large amounts.  She was drinking 1 glass of water per day.  She had significantly cut back on her soda intake.  Reviewed recommendations to be cautious when using salt substitutes due to potassium, especially given her h/o hyperkalemia. In addition, suspicion was for possible repeat hyperkalemia, given her junctional  bradycardia and sx reported. Labs collected included BNP, CMP, CBC.  Results showed elevated potassium with recommendation for stat BMET to exclude pseudohyperkalemia. BMET was obtained 9/29 and patient notified 10/1 of K 7.8.  On 12/15/2019, she presented to the emergency department after [facility] reportedly took labs and found potassium significantly elevated at 7.8, and once labs were rechecked per our request on 9/29.  Of note, recheck in the emergency department showed sodium 134, creatinine 1.14, BUN 26, and potassium 4.6 with high-sensitivity troponin 7.  Vitals showed BP 153/55, HR 69, 99% on room air.  It was noted that it was unclear whether or not the value of 7.8 achieved via home health was a true value or 2/2 pseudohyperkalemia but had self resolved.  Today, 12/21/2019, she presents to clinic and notes that she is  overall doing well since last seen in office.  She is joined by her daughter-in-law.  She reports improved energy today but does continue to note labile pressures.  She occasionally feels dizzy; however, this now only occurs when she lays flat in the bed versus occurring without any clear triggers at her previous visit. She is currently using 2 pillows, as she feels dizzy when laying flat, which she states is not new for her. She does not feel she is holding on to fluid or gaining weight, and she reports improved DOE, LEE, and frequency of dizziness since going back to PRN lasix / sliding scale lasix based on wt. She denies any s/sx of bleeding with recommendation again to discontinue ASA to reduce risk of bleeding on Eliquis and given her anemia on CBC. On exam, a significant bruit is heard across her subclavian on the left side with patient reporting that she does not regularly follow with vascular surgery for this or have regular scans; therefore, as below, carotid duplex ordered today, as she reports left shoulder pain at times, as well as left arm pain.  She denies any chest pain, shortness of breath at rest, syncope, or falls. She denies PND, abdominal distention, and early satiety. She is taking her anticoagulation consistently without signs or symptoms of bleeding.  We again reviewed her medications and with recommendations as below.  BP today elevated at 170/50 with weight 151 pounds. BP medications and hold parameters reviewed with daughter in law stating she is not certain of which medications are held and will have the pt's son send that information via [place]. Per patient, she has had a great day with her daughter in law, as she is always cheery and loves to dance.   Home Medications    Prior to Admission medications   Medication Sig Start Date End Date Taking? Authorizing Provider  albuterol (PROVENTIL HFA) 108 (90 Base) MCG/ACT inhaler Inhale 2 puffs into the lungs every 4 (four) hours as  needed for wheezing or shortness of breath. 11/17/19   Carrie Mew, MD  albuterol (PROVENTIL) (2.5 MG/3ML) 0.083% nebulizer solution Take 2.5 mg by nebulization every 6 (six) hours as needed for wheezing or shortness of breath.    [provider]  alendronate (FOSAMAX) 70 MG tablet Take 70 mg by mouth once a week. Take with a full glass of water on an empty stomach.    [provider]  amLODipine (NORVASC) 10 MG tablet Take 10 mg by mouth daily.    [provider]  apixaban (ELIQUIS) 5 MG TABS tablet Take 1 tablet (5 mg total) by mouth 2 (two) times daily. 09/29/19   Nicole [facility]red  A, DO  aspirin EC 81 MG EC tablet Take 1 tablet (81 mg total) by mouth daily. 03/27/16   Hillary Bow, MD  buPROPion (WELLBUTRIN SR) 150 MG 12 hr tablet Take 150 mg by mouth 2 (two) times daily. 11/06/19   [provider]  feeding supplement, ENSURE ENLIVE, (ENSURE ENLIVE) LIQD Take 237 mLs by mouth daily. 09/27/19   Nicole Kindred A, DO  ferrous sulfate 325 (65 FE) MG EC tablet Take 1 tablet (325 mg total) by mouth 2 (two) times daily. 11/11/19 05/09/20  Mercy Riding, MD  fluticasone-salmeterol (ADVAIR HFA) 230-21 MCG/ACT inhaler Inhale 2 puffs into the lungs 2 (two) times daily.    [provider]  furosemide (LASIX) 20 MG tablet Take 1 tablet (20 mg total) by mouth daily. 11/13/19 05/11/20  Mercy Riding, MD  gabapentin (NEURONTIN) 300 MG capsule Take 300 mg by mouth 2 (two) times daily.     [provider]  hydrALAZINE (APRESOLINE) 25 MG tablet Take 25 mg by mouth in the morning and at bedtime.    [provider]  Ipratropium-Albuterol (COMBIVENT) 20-100 MCG/ACT AERS respimat Inhale 1 puff into the lungs 4 (four) times daily as needed for wheezing or shortness of breath. 11/11/19   Mercy Riding, MD  lidocaine (LIDODERM) 5 % Place 1 patch onto the skin daily. Remove & Discard patch within 12 hours or as directed by MD 09/28/19   Nicole Kindred A, DO   lisinopril (ZESTRIL) 20 MG tablet Take 2 tablets (40 mg total) by mouth daily. 11/13/19   Mercy Riding, MD  metoprolol tartrate (LOPRESSOR) 25 MG tablet Take 0.5 tablets (12.5 mg total) by mouth 2 (two) times daily. 11/11/19   Mercy Riding, MD  nitroGLYCERIN (NITROSTAT) 0.4 MG SL tablet Place 1 tablet (0.4 mg total) under the tongue every 5 (five) minutes as needed for chest pain. 09/27/19   Ezekiel Slocumb, DO  omeprazole (PRILOSEC) 20 MG capsule Take 20 mg by mouth daily.    [provider]  pravastatin (PRAVACHOL) 20 MG tablet Take 20 mg by mouth at bedtime.     [provider]  senna-docusate (SENOKOT-S) 8.6-50 MG tablet Take 1 tablet by mouth 2 (two) times daily between meals as needed for mild constipation. 11/11/19   Mercy Riding, MD  tamsulosin (FLOMAX) 0.4 MG CAPS capsule Take 0.4 mg by mouth daily.    [provider]  traZODone (DESYREL) 100 MG tablet Take 50-100 mg by mouth at bedtime. 11/06/19   [provider]  umeclidinium bromide (INCRUSE ELLIPTA) 62.5 MCG/INH AEPB Inhale 1 puff into the lungs daily. 11/11/19   Mercy Riding, MD  vitamin B-12 (CYANOCOBALAMIN) 1000 MCG tablet Take 1,000 mcg by mouth daily.    [provider]    Review of Systems    She reports improved palpitations and dizziness only when laying flat. She has improved energy.  She denies chest pain, dyspnea, pnd, orthopnea, n, v, syncope, edema, weight gain, or early satiety.     All other systems reviewed and are otherwise negative except as noted above.  Physical Exam    VS:  BP (!) 170/50 (BP Location: Right Arm, Patient Position: Sitting, Cuff Size: Normal)   Pulse 74   Ht 5\' 3"  (1.6 m)   Wt 151 lb (68.5 kg)   SpO2 97%   BMI 26.75 kg/m  , BMI Body mass index is 26.75 kg/m. GEN: Well nourished, well developed, in no acute distress.  Seated in wheelchair.  Joined by her daughter in law. HEENT: normal. Neck: Supple. No JVD.  ++ Left-sided carotid bruit  appreciated versus radiation from subclavian / LUE thrombi as outlined above.  No right-sided carotid bruit.  No masses.   Cardiac: RRR.  2/6 systolic murmur in RUSB.  No rubs, or gallops. No clubbing, cyanosis.  Bilateral mild/nonpitting edema.  Radials/DP/PT 2+ and equal bilaterally.  Respiratory:  Respirations regular and unlabored, CTAB. GI: Soft, nontender, nondistended, BS + x 4.   MS: no deformity or atrophy.  Skin: warm and dry, no rash. Neuro:  Strength and sensation are intact. Psych: Normal affect.  Accessory Clinical Findings    ECG personally reviewed by me today - SR with 1st degree AVB, 74bpm, PRi 253ms, QTc 319ms- no acute changes.  VITALS Reviewed today   Temp Readings from Last 3 Encounters:  12/15/19 98 F (36.7 C) (Oral)  11/17/19 98.1 F (36.7 C) (Oral)  11/11/19 97.7 F (36.5 C)   BP Readings from Last 3 Encounters:  12/21/19 (!) 170/50  12/15/19 (!) 158/56  12/07/19 (!) 130/42   Pulse Readings from Last 3 Encounters:  12/21/19 74  12/15/19 63  12/07/19 (!) 50    Wt Readings from Last 3 Encounters:  12/21/19 151 lb (68.5 kg)  12/15/19 147 lb (66.7 kg)  12/07/19 151 lb (68.5 kg)     LABS  reviewed today   Lab Results  Component Value Date   WBC 5.7 12/15/2019   HGB 11.7 (L) 12/15/2019   HCT 33.4 (L) 12/15/2019   MCV 89.8 12/15/2019   PLT 534 (H) 12/15/2019   Lab Results  Component Value Date   CREATININE 1.14 (H) 12/15/2019   BUN 26 (H) 12/15/2019   NA 134 (L) 12/15/2019   K 4.6 12/15/2019   CL 102 12/15/2019   CO2 24 12/15/2019   Lab Results  Component Value Date   ALT 13 12/07/2019   AST 13 12/07/2019   ALKPHOS 91 12/07/2019   BILITOT 0.5 12/07/2019   Lab Results  Component Value Date   CHOL 139 03/25/2016   HDL 46 03/25/2016   LDLCALC 76 03/25/2016   LDLDIRECT 67 12/07/2019   TRIG 114 09/18/2019   CHOLHDL 3.0 03/25/2016    Lab Results  Component Value Date   HGBA1C 5.4 09/16/2019   No results found for: TSH     Recent care everywhere labs: Glucose 107, sodium 134, potassium 4.6, chloride 100, CO2 32.2, calcium 10.4, BUN 37, creatinine 1.2, GFR 53, anion gap 6.4,  07/25/2019 TSH 0.693.  STUDIES/PROCEDURES reviewed today   Limited echo 11/10/2019 1. Left ventricular ejection fraction, by estimation, is 60 to 65%. The  left ventricle has normal function. The left ventricle has no regional  wall motion abnormalities. There is mild left ventricular hypertrophy.  Left ventricular diastolic parameters  are indeterminate.  2. Right ventricular systolic function is normal. The right ventricular  size is normal.  3. The mitral valve is normal in structure. No evidence of mitral valve  regurgitation. No evidence of mitral stenosis.   Echo  09/2019 1. Left ventricular ejection fraction, by estimation, is 60 to 65%. The  left ventricle has normal function. The left ventricle has no regional  wall motion abnormalities. Left ventricular diastolic parameters were  normal.  2. Right ventricular systolic function is normal. The right ventricular  size is normal.  3. The mitral valve is grossly normal. No evidence of mitral valve  regurgitation.  4. The aortic valve  is grossly normal. Aortic valve regurgitation is not  visualized. Echo 07/2019  Echo 07/2019   [facility]      Jelena, Malicoat                            [id number]  DOB: 02-18-1946         CARDIAC DIAGNOSTIC UNIT        Date: 08/08/2019 14:30:00                            Adult   Female Age: 59          ECHO-DOPPLER REPORT         Outpatient                            MPDC  ---------------------------------------------------- MD1: Romeo Apple   STUDY: Chest Wall     TAPE: 0000:00:0:00:00 BP: 100/91    ECHO: Yes  DOPPLER: Yes FILE: [phone]:  HR: 52   COLOR:  Yes CONTRAST: No   MACHINE: [id number] #3 Height: 62 in  RV BIOPSY: No     3D: No  SOUND QLTY: Moderate Weight: 139 lbs   MEDIUM: None                   BSA: 1.66, BMI: 25.40  ------------------------------------------------------------------------------   HISTORY: pre- Surgery   REASON: Assess LV function Assess RV function  INDICATION: I10 - Essential (primary) hypertension. U93.235 - Encounter for       other preprocedural examination. R01.1 - Cardiac murmur,       unspecified.      ECHOCARDIOGRAPHIC MEASUREMENTS -----------------------------------------------  2D DIMENSIONS  AORTA     Values   Normal RangeMAIN PA   Values   Normal Range    Annulus: nm* cm  [1.9 - 2.7]  PA Main: nm* cm  [1.5 - 2.1]   Aorta Sin: nm* cm  [2.4 - 3.6] RIGHT VENTRICLE  ST Junction: nm* cm  [2 - 3.2]   RV Base:  3.7 cm  [2.5 - 4.1]   Asc.Aorta: nm* cm  [1.9 - 3.5]   RV Mid:  2.5 cm  [1.9 - 3.5]  LEFT VENTRICLE             RV Length: nm* cm  [ ]      LVIDd:  4.3 cm  [3.7 - 5.3] RIGHT ATRIUM     LVIDs:  2.4 cm  [2.2 - 3.4]  RA Area: 12  cm2  [ <= 20]    LVEDVi: 76.0 ml/m2 [29 - 61]     RAVi: 14  ml/m2 [15 - 27]    LVESVi: 21.0 ml/m2 [8 - 24]  INFERIOR VENA CAVA      FS: 44  %   [ >= 25]    Max.IVC:  1.6 cm  [ <= 2.1]      SWT:  1.1 cm  [0.6 - 0.9]  Min.IVC: nm* cm  [ <= 1.7]      PWT:  1.1 cm  [0.6 - 0.9] __________________  LEFT ATRIUM              nm* - not measured    LA Diam:  3.7 cm  [2.7 - 3.8]    LA Area: 18  cm2  [ <= 20]   LA Volume: 52  ml  [22 - 52]     LAVi: 31  ml/m2 [16 - 34]    ECHOCARDIOGRAPHIC DESCRIPTIONS -----------------------------------------------  AORTIC ROOT     Size: Not seen  Dissection: INDETERM FOR DISSECTION    AORTIC VALVE    Leaflets: Tricuspid       Morphology: MILDLY THICKENED   Mobility: Fully Mobile    LEFT VENTRICLE                   Anterior: Normal     Size: Normal                 Lateral: Normal  Contraction: Normal                 Septal: Normal  Closest EF: >55%(Estimated) Calc.EF: 72% (3D)   Apical: Normal   LV masses: No Masses               Inferior: Normal      LVH: MILD LVH CONCENTRIC         Posterior: Normal  LV GLS(AVG): -17.6% Normal Range [ <= -16]  Dias.FxClass: Normal    MITRAL VALVE   Leaflets: Normal         Mobility: Fully mobile  Morphology: Normal    LEFT ATRIUM     Size: Normal   LA masses: No masses        Normal IAS    MAIN PA     Size: Normal    PULMONIC VALVE  Morphology: Normal   Mobility: Fully Mobile    RIGHT VENTRICLE     Size: Normal          Free wall: Normal  Contraction: Normal          RV masses: No Masses     TAPSE:  1.9 cm, Normal Range [>= 1.6 cm]    TRICUSPID VALVE   Leaflets: Normal         Mobility: Fully mobile  Morphology: Normal    RIGHT ATRIUM     Size: Normal           RA Other: None   RA masses: No masses    PERICARDIUM     Fluid: TRIVIAL FLUID    INFERIOR VENACAVA     Size: Normal   Normal respiratory collapse    DOPPLER ECHO and OTHER SPECIAL PROCEDURES ------------------------------------   Aortic: No AR         No AS     Mitral: TRIVIAL MR       No MS   MV Inflow E Vel.= 74.0 cm/s MV Annulus E'Vel.= 8.5 cm/s E/E'Ratio= 9    Tricuspid: TRIVIAL TR       No TS    Pulmonary: MILD PR        No PS     Other:    INTERPRETATION ---------------------------------------------------------------  NORMAL LEFT VENTRICULAR SYSTOLIC FUNCTION WITH  MILD LVH  NORMAL LA PRESSURES WITH NORMAL DIASTOLIC FUNCTION  NORMAL RIGHT VENTRICULAR SYSTOLIC FUNCTION  VALVULAR REGURGITATION: TRIVIAL MR, MILD PR, TRIVIAL TR  NO VALVULAR STENOSIS  TRIVIAL PERICARDIAL EFFUSION    Compared with prior Echo study on 02/22/2009: ([place]): NO  SIGNIFICANT CHANGES      (Report version 3.0)          Interpreted and Electronically signed  Perform. by: Livingston Diones, RDCS        by: Ignatius Specking, MD  Resp.Person: Livingston Diones, RDCS  On: 08/08/2019 17:25:43     Recent care everywhere labs 12/06/2019: Glucose 107, sodium 134, potassium 4.6, chloride 100, CO2 32.2, calcium 10.4, BUN 37, creatinine 1.2, GFR 53, anion gap 6.4,  07/25/2019: TSH 0.693  Assessment & Plan    Chronic diastolic congestive heart failure --No significant SOB / DOE. Appears euvolemic on exam with wt stable from that of previous weight. Most recent echo nl EF, NRWMA, no severe/significant valvular abnormalities, and R heart pressures as above. At her last visit, it was discovered her PRN lasix was being taken daily with AKI and hyperkalemia on repeat labs. Recommendation was to again start PRN lasix with daily weights and sliding scale guidelines reviewed in detail. Also reviewed was fluid intake under 2L and salt under 2g. Long discussion regarding remaining hydrated but also monitoring her fluid. Given her elevated potassium, it was recommended she not use too much Ms. Dash but also continue to avoid salt. Today, wt remains stable, though BP elevated with recommendations as below.  Continue current PRN lasix (based on weight, sliding scale). Closely monitor weights and volume status. Again discussed fluids under 2L and salt under 2g. Per daughter in law, son will send latest BP/ wt information via [facility]. They will bring daily wt and BP to next visit. No ACE/ARB/ARNI in the setting of history of frequent hyperkalemia. Will  obtain BMET today to verify that potassium and renal function still wnl after recent ED visit.   Hypertension, poorly controlled --BP significantly elevated this AM. Per daughter in law, hydralazine held now if BP low; however, she is unable to recall the hold parameters.  Given her poorly controlled BP today, reviewed proper techniques for taking BP with goal BP 130/80 or lower.  Reviewed keeping fluids under 2 L and salt under 2 g.  In addition, reviewed new recommendations for holding medications to prevent ongoing labile and elevated BP.  Given rebound when holding hydralazine, new recommendations as below: Take Amlodipine 5 mg in the morning and amlodipine 5 mg in the evening (total amlodipine 10mg  daily and unchanged). In AM, if SBP below 384 systolic, hold amlodipine 5 mg (a.m. dose). Do not hold hydralazine.  In the evening, recheck BP. If SBP below 665 systolic, hold amlodipine 5 mg (p.m. dose). Do not hold hydralazine. For both AM and PM amlodipine, administer if BP above 993 systolic.   Of note, she reports feeling poorly at low pressures and cannot recall home health hold SBP parameters. She believes they are much higher. Recommendation was to call the office if home health hold parameters are different from those recommended and we can consider SBP 120 if needed; however, if tolerated and not dizzy, lower pressures are preferred.     History of Bradycardia, junctional bradycardia History of electrolyte abnormalities --Resolved from previous visit. At her last visit on 9/23, EKG showed bradycardic rate with rhythm strip suspicious for junction bradycardia.  She had undergone multiple medication changes and was taking lasix daily, rather than PRN.  BMET obtained at last 9/23 clinic visit showed AKI, as well as hyperkalemia at 5.9 and hyponatremia with Na 131. STAT BMET requested and obtained by home health 9/29 with K 7.8. She went to the ED 10/1 with recheck showing K 4.6 and Na 134. In clinic  today, EKG shows resolution of previous junctional bradycardia seen on last visit's rhythm strip and pt reports improvement in previous symptoms at last visit, including improved energy, LEE, DOE, and dizziness.  Recheck BMET today to confirm electrolytes  wnl.   History of left subclavian clot/left upper extremity clot --Placed on Eliquis for left subclavian clot. She does not meet criteria for reduced dosing at this time. Recommended again to discontinue ASA, given her risk of bleeding on both ASA and Eliquis. Her last CBC showed Hgb 12.0  11.7 and Hct 38.2  33.4.   Recommended frequent recheck of CBC per PCP / home health and closely follow. Discontinue ASA to reduce risk of bleeding. Today, she denies s/sx of bleeding, reviewed in detail.    Ordered ultrasound of left subclavian to reassess, given ongoing L arm and shoulder pain and dizziness with history of UE thrombosis as noted above versus L carotid dz.  L subclavian / carotid bruit --As above.  Unclear if bruit 2/2 radiation from valvular disease, UE thrombosis as noted above, or L carotid dz.   Ordered ultrasound of left subclavian to reassess. Recommended ongoing Eliquis, HR/BP control, and cholesterol control with statin.   Anxiety --Bupropion changed to citalopram and trazodone added per PCP as she had not been sleeping well. QTc wnl on EKG.  Continue to monitor.  Prior tobacco use COPD --Reports ongoing smoking cessation.  Continue.  HLD --Continue statin.  Anemia of chronic disease Iron deficiency anemia History of thrombocytosis -Continue to follow with nephrology, hematology. Recommend frequent CBC per PCP/home health. Continue to recommend discontinue ASA to reduce risk of bleeding as per EMR and pt recollection unless pt has otherwise been told by a vascular surgeon or neurologist, which she does not recall is the case.   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  Consider as contributing to her history of  thrombosis as above.  Continue to follow with hematology and nephrology.  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   Medication changes: When BP low, recommend hold CCB over hydralazine to prevent reflex tachycardia. Therefore, change amlodipine 10mg  daily to amlodipine 5mg  in AM and 5mg  in PM. If SBP at or below 110, hold AM amlodipine but take hydralazine. Do the same with the PM amlodipine 5mg . If SBP above 110, take AM / PM amlodipine. She will notify us of her current home health hold parameters, because she states she feels poorly with low BP but cannot recall the current hold parameters.  Discontinue ASA and continue Eliquis unless otherwise indicated by vascular surgery or neurology. Labs ordered: BMP to ensure renal function and electrolytes still wnl  Studies / Imaging ordered: Ultrasound of carotids/repeat ultrasound of subclavian.  Consider referral to [facility] for ongoing monitoring and management. Future considerations: Additional antihypertensive therapy if needed. Adjust hold parameters of medications if needed. Disposition: After carotid ultrasound, ~ 1 month .   Arvil Chaco, PA-C 12/21/2019

## 2019-12-22 ENCOUNTER — Telehealth: Payer: Self-pay | Admitting: Cardiovascular Disease

## 2019-12-22 DIAGNOSIS — D473 Essential (hemorrhagic) thrombocythemia: Secondary | ICD-10-CM | POA: Diagnosis not present

## 2019-12-22 DIAGNOSIS — D472 Monoclonal gammopathy: Secondary | ICD-10-CM | POA: Diagnosis not present

## 2019-12-22 DIAGNOSIS — N1330 Unspecified hydronephrosis: Secondary | ICD-10-CM | POA: Diagnosis not present

## 2019-12-22 DIAGNOSIS — D509 Iron deficiency anemia, unspecified: Secondary | ICD-10-CM | POA: Diagnosis not present

## 2019-12-22 DIAGNOSIS — I5033 Acute on chronic diastolic (congestive) heart failure: Secondary | ICD-10-CM | POA: Diagnosis not present

## 2019-12-22 DIAGNOSIS — D4959 Neoplasm of unspecified behavior of other genitourinary organ: Secondary | ICD-10-CM | POA: Diagnosis not present

## 2019-12-22 DIAGNOSIS — I110 Hypertensive heart disease with heart failure: Secondary | ICD-10-CM | POA: Diagnosis not present

## 2019-12-22 DIAGNOSIS — J441 Chronic obstructive pulmonary disease with (acute) exacerbation: Secondary | ICD-10-CM | POA: Diagnosis not present

## 2019-12-22 DIAGNOSIS — E871 Hypo-osmolality and hyponatremia: Secondary | ICD-10-CM | POA: Diagnosis not present

## 2019-12-22 LAB — BASIC METABOLIC PANEL
BUN/Creatinine Ratio: 22 (ref 12–28)
BUN: 24 mg/dL (ref 8–27)
CO2: 24 mmol/L (ref 20–29)
Calcium: 10.2 mg/dL (ref 8.7–10.3)
Chloride: 100 mmol/L (ref 96–106)
Creatinine, Ser: 1.11 mg/dL — ABNORMAL HIGH (ref 0.57–1.00)
GFR calc Af Amer: 56 mL/min/{1.73_m2} — ABNORMAL LOW (ref >59)
GFR calc non Af Amer: 49 mL/min/{1.73_m2} — ABNORMAL LOW (ref >59)
Glucose: 80 mg/dL (ref 65–99)
Potassium: 5.2 mmol/L (ref 3.5–5.2)
Sodium: 136 mmol/L (ref 134–144)

## 2019-12-22 NOTE — Telephone Encounter (Signed)
Spoke with answering service and advised that we do not need any labs done at this time. We did some yesterday and no further need for now.

## 2019-12-22 NOTE — Telephone Encounter (Signed)
Please call to let nurse know at [facility] if blood needs to be drawn. Nurse is there now.

## 2019-12-25 DIAGNOSIS — E875 Hyperkalemia: Secondary | ICD-10-CM | POA: Diagnosis not present

## 2019-12-25 DIAGNOSIS — F339 Major depressive disorder, recurrent, unspecified: Secondary | ICD-10-CM | POA: Diagnosis not present

## 2019-12-25 DIAGNOSIS — J449 Chronic obstructive pulmonary disease, unspecified: Secondary | ICD-10-CM | POA: Diagnosis not present

## 2019-12-25 DIAGNOSIS — I5030 Unspecified diastolic (congestive) heart failure: Secondary | ICD-10-CM | POA: Diagnosis not present

## 2019-12-25 DIAGNOSIS — I110 Hypertensive heart disease with heart failure: Secondary | ICD-10-CM | POA: Diagnosis not present

## 2019-12-25 DIAGNOSIS — F419 Anxiety disorder, unspecified: Secondary | ICD-10-CM | POA: Diagnosis not present

## 2019-12-26 DIAGNOSIS — J441 Chronic obstructive pulmonary disease with (acute) exacerbation: Secondary | ICD-10-CM | POA: Diagnosis not present

## 2019-12-26 DIAGNOSIS — I110 Hypertensive heart disease with heart failure: Secondary | ICD-10-CM | POA: Diagnosis not present

## 2019-12-26 DIAGNOSIS — D4959 Neoplasm of unspecified behavior of other genitourinary organ: Secondary | ICD-10-CM | POA: Diagnosis not present

## 2019-12-26 DIAGNOSIS — D473 Essential (hemorrhagic) thrombocythemia: Secondary | ICD-10-CM | POA: Diagnosis not present

## 2019-12-26 DIAGNOSIS — N1330 Unspecified hydronephrosis: Secondary | ICD-10-CM | POA: Diagnosis not present

## 2019-12-26 DIAGNOSIS — E871 Hypo-osmolality and hyponatremia: Secondary | ICD-10-CM | POA: Diagnosis not present

## 2019-12-26 DIAGNOSIS — D509 Iron deficiency anemia, unspecified: Secondary | ICD-10-CM | POA: Diagnosis not present

## 2019-12-26 DIAGNOSIS — D472 Monoclonal gammopathy: Secondary | ICD-10-CM | POA: Diagnosis not present

## 2019-12-26 DIAGNOSIS — I5033 Acute on chronic diastolic (congestive) heart failure: Secondary | ICD-10-CM | POA: Diagnosis not present

## 2019-12-26 NOTE — Addendum Note (Signed)
Addended by: Raelene Bott, Chace Bisch L on: 12/26/2019 02:32 PM   Modules accepted: Orders

## 2019-12-29 DIAGNOSIS — D472 Monoclonal gammopathy: Secondary | ICD-10-CM | POA: Diagnosis not present

## 2019-12-29 DIAGNOSIS — I110 Hypertensive heart disease with heart failure: Secondary | ICD-10-CM | POA: Diagnosis not present

## 2019-12-29 DIAGNOSIS — D473 Essential (hemorrhagic) thrombocythemia: Secondary | ICD-10-CM | POA: Diagnosis not present

## 2019-12-29 DIAGNOSIS — J441 Chronic obstructive pulmonary disease with (acute) exacerbation: Secondary | ICD-10-CM | POA: Diagnosis not present

## 2019-12-29 DIAGNOSIS — N1330 Unspecified hydronephrosis: Secondary | ICD-10-CM | POA: Diagnosis not present

## 2019-12-29 DIAGNOSIS — D509 Iron deficiency anemia, unspecified: Secondary | ICD-10-CM | POA: Diagnosis not present

## 2019-12-29 DIAGNOSIS — D4959 Neoplasm of unspecified behavior of other genitourinary organ: Secondary | ICD-10-CM | POA: Diagnosis not present

## 2019-12-29 DIAGNOSIS — E871 Hypo-osmolality and hyponatremia: Secondary | ICD-10-CM | POA: Diagnosis not present

## 2019-12-29 DIAGNOSIS — I5033 Acute on chronic diastolic (congestive) heart failure: Secondary | ICD-10-CM | POA: Diagnosis not present

## 2020-01-01 DIAGNOSIS — R8290 Unspecified abnormal findings in urine: Secondary | ICD-10-CM | POA: Diagnosis not present

## 2020-01-01 DIAGNOSIS — I10 Essential (primary) hypertension: Secondary | ICD-10-CM | POA: Diagnosis not present

## 2020-01-01 DIAGNOSIS — E875 Hyperkalemia: Secondary | ICD-10-CM | POA: Diagnosis not present

## 2020-01-01 DIAGNOSIS — F419 Anxiety disorder, unspecified: Secondary | ICD-10-CM | POA: Diagnosis not present

## 2020-01-01 DIAGNOSIS — R399 Unspecified symptoms and signs involving the genitourinary system: Secondary | ICD-10-CM | POA: Diagnosis not present

## 2020-01-02 DIAGNOSIS — D472 Monoclonal gammopathy: Secondary | ICD-10-CM | POA: Diagnosis not present

## 2020-01-02 DIAGNOSIS — N1330 Unspecified hydronephrosis: Secondary | ICD-10-CM | POA: Diagnosis not present

## 2020-01-02 DIAGNOSIS — E871 Hypo-osmolality and hyponatremia: Secondary | ICD-10-CM | POA: Diagnosis not present

## 2020-01-02 DIAGNOSIS — D4959 Neoplasm of unspecified behavior of other genitourinary organ: Secondary | ICD-10-CM | POA: Diagnosis not present

## 2020-01-02 DIAGNOSIS — I5033 Acute on chronic diastolic (congestive) heart failure: Secondary | ICD-10-CM | POA: Diagnosis not present

## 2020-01-02 DIAGNOSIS — D473 Essential (hemorrhagic) thrombocythemia: Secondary | ICD-10-CM | POA: Diagnosis not present

## 2020-01-02 DIAGNOSIS — I110 Hypertensive heart disease with heart failure: Secondary | ICD-10-CM | POA: Diagnosis not present

## 2020-01-02 DIAGNOSIS — J441 Chronic obstructive pulmonary disease with (acute) exacerbation: Secondary | ICD-10-CM | POA: Diagnosis not present

## 2020-01-02 DIAGNOSIS — D509 Iron deficiency anemia, unspecified: Secondary | ICD-10-CM | POA: Diagnosis not present

## 2020-01-05 DIAGNOSIS — I5033 Acute on chronic diastolic (congestive) heart failure: Secondary | ICD-10-CM | POA: Diagnosis not present

## 2020-01-05 DIAGNOSIS — J441 Chronic obstructive pulmonary disease with (acute) exacerbation: Secondary | ICD-10-CM | POA: Diagnosis not present

## 2020-01-05 DIAGNOSIS — D509 Iron deficiency anemia, unspecified: Secondary | ICD-10-CM | POA: Diagnosis not present

## 2020-01-05 DIAGNOSIS — D473 Essential (hemorrhagic) thrombocythemia: Secondary | ICD-10-CM | POA: Diagnosis not present

## 2020-01-05 DIAGNOSIS — N1330 Unspecified hydronephrosis: Secondary | ICD-10-CM | POA: Diagnosis not present

## 2020-01-05 DIAGNOSIS — D4959 Neoplasm of unspecified behavior of other genitourinary organ: Secondary | ICD-10-CM | POA: Diagnosis not present

## 2020-01-05 DIAGNOSIS — D472 Monoclonal gammopathy: Secondary | ICD-10-CM | POA: Diagnosis not present

## 2020-01-05 DIAGNOSIS — I110 Hypertensive heart disease with heart failure: Secondary | ICD-10-CM | POA: Diagnosis not present

## 2020-01-05 DIAGNOSIS — D75839 Thrombocytosis, unspecified: Secondary | ICD-10-CM | POA: Insufficient documentation

## 2020-01-05 DIAGNOSIS — E871 Hypo-osmolality and hyponatremia: Secondary | ICD-10-CM | POA: Diagnosis not present

## 2020-01-05 NOTE — Progress Notes (Signed)
[place]  [phone] [phone]  ID: [name] OB: [birth year]  MR#: [id number]  NIO#:[id number]  Patient Care Team: Tracie Harrier, MD as PCP - General (Internal Medicine) Minna Merritts, MD as PCP - Cardiology (Cardiology) Corsino, Baird Lyons, MD (Specialist) System, Provider Not In  CHIEF COMPLAINT: Thrombocytosis.  INTERVAL HISTORY: Patient is a 74 year old female who by report has a longstanding history of thrombocytosis and was evaluated at [facility].  She is referred for further evaluation and to transfer care.  She has having left flank pain and is actively being worked up for an abnormality/mass in her left ureter.  She otherwise feels well.  She has no neurologic complaints.  She denies any recent fevers or illnesses.  She has a good appetite and denies weight loss.  She denies any other pain.  She has no chest pain, shortness of breath, cough, or hemoptysis.  She denies any nausea, vomiting, constipation, or diarrhea.  She has no melena or hematochezia.  She has no urinary complaints, but has a recent history of hematuria.  Patient otherwise feels well and offers no further specific complaints today.  REVIEW OF SYSTEMS:   Review of Systems  Constitutional: Negative.  Negative for fever, malaise/fatigue and weight loss.  Respiratory: Negative.  Negative for cough, hemoptysis and shortness of breath.   Cardiovascular: Negative.  Negative for chest pain and leg swelling.  Gastrointestinal: Negative.  Negative for abdominal pain, blood in stool and melena.  Genitourinary: Positive for flank pain and hematuria.  Musculoskeletal: Negative for back pain.  Skin: Negative.  Negative for rash.  Neurological: Negative.  Negative for dizziness, focal weakness, weakness and headaches.  Psychiatric/Behavioral: Negative.  The patient is not nervous/anxious.     As per HPI. Otherwise, a complete review of systems is negative.  PAST MEDICAL  HISTORY: Past Medical History:  Diagnosis Date  . Anxiety   . Asthma   . CHF (congestive heart failure) ([place])   . COPD (chronic obstructive pulmonary disease) ([place])   . Depression   . GERD (gastroesophageal reflux disease)   . Hypertension     PAST SURGICAL HISTORY: Past Surgical History:  Procedure Laterality Date  . ABDOMINAL HYSTERECTOMY    . TOTAL HIP ARTHROPLASTY Left   . TUBAL LIGATION      FAMILY HISTORY: Family History  Problem Relation Age of Onset  . Heart disease Father   . Heart disease Sister     ADVANCED DIRECTIVES (Y/N):  N  HEALTH MAINTENANCE: Social History   Tobacco Use  . Smoking status: Former Smoker    Packs/day: 0.75    Years: 40.00    Pack years: 30.00    Types: Cigarettes    Quit date: 11/05/2019    Years since quitting: 0.1  . Smokeless tobacco: Never Used  Vaping Use  . Vaping Use: Never used  Substance Use Topics  . Alcohol use: No  . Drug use: No     Colonoscopy:  PAP:  Bone density:  Lipid panel:  Allergies  Allergen Reactions  . Morphine And Related Other (See Comments)    Delerium, itching  . Nsaids Other (See Comments)    GI ulcers  . Aspirin Other (See Comments)    GI ulcers Can tolerate 81 mg daily    Current Outpatient Medications  Medication Sig Dispense Refill  . albuterol (PROVENTIL HFA) 108 (90 Base) MCG/ACT inhaler Inhale 2 puffs into the lungs every 4 (four) hours as needed for wheezing or shortness of  breath. 1 each 0  . albuterol (PROVENTIL) (2.5 MG/3ML) 0.083% nebulizer solution Take 2.5 mg by nebulization every 6 (six) hours as needed for wheezing or shortness of breath.    Marland Kitchen alendronate (FOSAMAX) 70 MG tablet Take 70 mg by mouth once a week. Take with a full glass of water on an empty stomach.    Marland Kitchen amLODipine (NORVASC) 10 MG tablet Take 0.5 tablets (5 mg total) by mouth 2 (two) times daily as needed (Hold for Top blood pressure less than 120). 45 tablet 3  . apixaban (ELIQUIS) 5 MG TABS tablet Take 1  tablet (5 mg total) by mouth 2 (two) times daily. 60 tablet 1  . aspirin EC 81 MG EC tablet Take 1 tablet (81 mg total) by mouth daily.    . bisacodyl (DULCOLAX) 5 MG EC tablet Take by mouth.    . citalopram (CELEXA) 10 MG tablet Take 10 mg by mouth daily.     Mariane Baumgarten Sodium (DSS) 100 MG CAPS Take by mouth daily as needed.     . feeding supplement, ENSURE ENLIVE, (ENSURE ENLIVE) LIQD Take 237 mLs by mouth daily. 237 mL 1  . ferrous sulfate 325 (65 FE) MG EC tablet Take 1 tablet (325 mg total) by mouth 2 (two) times daily. 180 tablet 1  . fluticasone-salmeterol (ADVAIR HFA) 230-21 MCG/ACT inhaler Inhale 2 puffs into the lungs 2 (two) times daily.    . furosemide (LASIX) 20 MG tablet Take 1 tablet (20 mg total) by mouth daily. 90 tablet 1  . gabapentin (NEURONTIN) 300 MG capsule Take 300 mg by mouth daily.     . hydrALAZINE (APRESOLINE) 25 MG tablet Take 25 mg by mouth in the morning and at bedtime.    . Ipratropium-Albuterol (COMBIVENT) 20-100 MCG/ACT AERS respimat Inhale 1 puff into the lungs 4 (four) times daily as needed for wheezing or shortness of breath. 1 each 1  . lidocaine (LIDODERM) 5 % Place 1 patch onto the skin daily. Remove & Discard patch within 12 hours or as directed by MD 30 patch 0  . metoprolol tartrate (LOPRESSOR) 25 MG tablet Take 0.5 tablets (12.5 mg total) by mouth 2 (two) times daily. (Patient taking differently: Take 25 mg by mouth 2 (two) times daily. ) 60 tablet 1  . nitroGLYCERIN (NITROSTAT) 0.4 MG SL tablet Place 1 tablet (0.4 mg total) under the tongue every 5 (five) minutes as needed for chest pain. 30 tablet 0  . omeprazole (PRILOSEC) 20 MG capsule Take 20 mg by mouth daily.    . pravastatin (PRAVACHOL) 20 MG tablet Take 20 mg by mouth at bedtime.     . senna-docusate (SENOKOT-S) 8.6-50 MG tablet Take 1 tablet by mouth 2 (two) times daily between meals as needed for mild constipation. 60 tablet 0  . tamsulosin (FLOMAX) 0.4 MG CAPS capsule Take 0.4 mg by mouth  daily.    . traZODone (DESYREL) 100 MG tablet Take 50-100 mg by mouth at bedtime.    Marland Kitchen umeclidinium bromide (INCRUSE ELLIPTA) 62.5 MCG/INH AEPB Inhale 1 puff into the lungs daily. 1 each 1  . vitamin B-12 (CYANOCOBALAMIN) 1000 MCG tablet Take 1,000 mcg by mouth daily.     No current facility-administered medications for this visit.    OBJECTIVE: Vitals:   01/09/20 1130  BP: 117/65  Pulse: (!) 45  Resp: 20  Temp: (!) 97.3 F (36.3 C)  SpO2: 100%     Body mass index is 27.05 kg/m.    ECOG FS:0 -  Asymptomatic  General: Well-developed, well-nourished, no acute distress. Eyes: Pink conjunctiva, anicteric sclera. HEENT: Normocephalic, moist mucous membranes. Lungs: No audible wheezing or coughing. Heart: Regular rate and rhythm. Abdomen: Soft, nontender, no obvious distention. Musculoskeletal: No edema, cyanosis, or clubbing. Neuro: Alert, answering all questions appropriately. Cranial nerves grossly intact. Skin: No rashes or petechiae noted. Psych: Normal affect. Lymphatics: No cervical, calvicular, axillary or inguinal LAD.   LAB RESULTS:  Lab Results  Component Value Date   NA 136 12/21/2019   K 5.2 12/21/2019   CL 100 12/21/2019   CO2 24 12/21/2019   GLUCOSE 80 12/21/2019   BUN 24 12/21/2019   CREATININE 1.11 (H) 12/21/2019   CALCIUM 10.2 12/21/2019   PROT 6.9 12/07/2019   ALBUMIN 4.9 (H) 12/07/2019   AST 13 12/07/2019   ALT 13 12/07/2019   ALKPHOS 91 12/07/2019   BILITOT 0.5 12/07/2019   GFRNONAA 49 (L) 12/21/2019   GFRAA 56 (L) 12/21/2019    Lab Results  Component Value Date   WBC 6.2 01/09/2020   NEUTROABS 14.6 (H) 09/17/2019   HGB 12.9 01/09/2020   HCT 37.6 01/09/2020   MCV 91.0 01/09/2020   PLT 530 (H) 01/09/2020     STUDIES: No results found.  ASSESSMENT: Thrombocytosis.  PLAN:    1. Thrombocytosis: Patient's platelet count is 530 today which appears to be approximately her baseline.  The remainder of her laboratory work from today is  pending at time of dictation.  Unclear of the work-up she had at [facility], but patient does not report a bone marrow biopsy.  This is not necessary at this time.  No intervention is needed.  Patient will have a video assisted telemedicine visit in 3 weeks to discuss her laboratory results and further evaluation. 2.  Elevated paraprotein: MGUS labs are pending at time of dictation.  No intervention is needed.  No bone marrow biopsy is necessary as above. 3.  Flank pain/possible ureteral mass: Patient has been instructed to continue follow-up at [facility] as scheduled. 4.  Chronic renal insufficiency: Mild, monitor.  Patient expressed understanding and was in agreement with this plan. She also understands that She can call clinic at any time with any questions, concerns, or complaints.   Cancer Staging No matching staging information was found for the patient.  Lloyd Huger, MD   01/09/2020 12:45 PM

## 2020-01-08 ENCOUNTER — Encounter: Payer: Self-pay | Admitting: Oncology

## 2020-01-08 NOTE — Progress Notes (Signed)
New patient referred by Dr Ginette Pitman, Thrombocytosis, unspecified, Monoclonal gammopathy. Spoke with son during pre assessment. He states patient has been having pain in left side of kidneys for about a week now. She has appointment with primary tomorrow to follow up on concern. He denies other concerns at this time.

## 2020-01-09 ENCOUNTER — Inpatient Hospital Stay: Payer: Medicare HMO | Attending: Oncology | Admitting: Oncology

## 2020-01-09 ENCOUNTER — Other Ambulatory Visit: Payer: Self-pay

## 2020-01-09 ENCOUNTER — Inpatient Hospital Stay: Payer: Medicare HMO

## 2020-01-09 DIAGNOSIS — D75839 Thrombocytosis, unspecified: Secondary | ICD-10-CM

## 2020-01-09 DIAGNOSIS — M5489 Other dorsalgia: Secondary | ICD-10-CM | POA: Diagnosis not present

## 2020-01-09 DIAGNOSIS — R778 Other specified abnormalities of plasma proteins: Secondary | ICD-10-CM | POA: Insufficient documentation

## 2020-01-09 DIAGNOSIS — I130 Hypertensive heart and chronic kidney disease with heart failure and stage 1 through stage 4 chronic kidney disease, or unspecified chronic kidney disease: Secondary | ICD-10-CM | POA: Diagnosis not present

## 2020-01-09 DIAGNOSIS — N189 Chronic kidney disease, unspecified: Secondary | ICD-10-CM | POA: Diagnosis not present

## 2020-01-09 DIAGNOSIS — R109 Unspecified abdominal pain: Secondary | ICD-10-CM | POA: Insufficient documentation

## 2020-01-09 DIAGNOSIS — M25552 Pain in left hip: Secondary | ICD-10-CM | POA: Diagnosis not present

## 2020-01-09 DIAGNOSIS — M545 Low back pain, unspecified: Secondary | ICD-10-CM | POA: Diagnosis not present

## 2020-01-09 LAB — CBC
HCT: 37.6 % (ref 36.0–46.0)
Hemoglobin: 12.9 g/dL (ref 12.0–15.0)
MCH: 31.2 pg (ref 26.0–34.0)
MCHC: 34.3 g/dL (ref 30.0–36.0)
MCV: 91 fL (ref 80.0–100.0)
Platelets: 530 10*3/uL — ABNORMAL HIGH (ref 150–400)
RBC: 4.13 MIL/uL (ref 3.87–5.11)
RDW: 17.1 % — ABNORMAL HIGH (ref 11.5–15.5)
WBC: 6.2 10*3/uL (ref 4.0–10.5)
nRBC: 0 % (ref 0.0–0.2)

## 2020-01-09 LAB — BASIC METABOLIC PANEL
Anion gap: 8 (ref 5–15)
BUN: 29 mg/dL — ABNORMAL HIGH (ref 8–23)
CO2: 23 mmol/L (ref 22–32)
Calcium: 10 mg/dL (ref 8.9–10.3)
Chloride: 105 mmol/L (ref 98–111)
Creatinine, Ser: 1 mg/dL (ref 0.44–1.00)
GFR, Estimated: 59 mL/min — ABNORMAL LOW (ref >60)
Glucose, Bld: 87 mg/dL (ref 70–99)
Potassium: 5.5 mmol/L — ABNORMAL HIGH (ref 3.5–5.1)
Sodium: 136 mmol/L (ref 135–145)

## 2020-01-09 LAB — IRON AND TIBC
Iron: 100 ug/dL (ref 28–170)
Saturation Ratios: 25 % (ref 10.4–31.8)
TIBC: 398 ug/dL (ref 250–450)
UIBC: 298 ug/dL

## 2020-01-09 LAB — FERRITIN: Ferritin: 41 ng/mL (ref 11–307)

## 2020-01-10 LAB — IGG, IGA, IGM
IgA: 155 mg/dL (ref 64–422)
IgG (Immunoglobin G), Serum: 800 mg/dL (ref 586–1602)
IgM (Immunoglobulin M), Srm: 229 mg/dL — ABNORMAL HIGH (ref 26–217)

## 2020-01-10 LAB — PROTEIN ELECTROPHORESIS, SERUM
A/G Ratio: 1.6 (ref 0.7–1.7)
Albumin ELP: 4.4 g/dL (ref 2.9–4.4)
Alpha-1-Globulin: 0.2 g/dL (ref 0.0–0.4)
Alpha-2-Globulin: 0.7 g/dL (ref 0.4–1.0)
Beta Globulin: 1 g/dL (ref 0.7–1.3)
Gamma Globulin: 0.8 g/dL (ref 0.4–1.8)
Globulin, Total: 2.7 g/dL (ref 2.2–3.9)
Total Protein ELP: 7.1 g/dL (ref 6.0–8.5)

## 2020-01-10 LAB — KAPPA/LAMBDA LIGHT CHAINS
Kappa free light chain: 37.8 mg/L — ABNORMAL HIGH (ref 3.3–19.4)
Kappa, lambda light chain ratio: 2.63 — ABNORMAL HIGH (ref 0.26–1.65)
Lambda free light chains: 14.4 mg/L (ref 5.7–26.3)

## 2020-01-11 DIAGNOSIS — N1330 Unspecified hydronephrosis: Secondary | ICD-10-CM | POA: Diagnosis not present

## 2020-01-11 DIAGNOSIS — J441 Chronic obstructive pulmonary disease with (acute) exacerbation: Secondary | ICD-10-CM | POA: Diagnosis not present

## 2020-01-11 DIAGNOSIS — D4959 Neoplasm of unspecified behavior of other genitourinary organ: Secondary | ICD-10-CM | POA: Diagnosis not present

## 2020-01-11 DIAGNOSIS — I5033 Acute on chronic diastolic (congestive) heart failure: Secondary | ICD-10-CM | POA: Diagnosis not present

## 2020-01-11 DIAGNOSIS — D472 Monoclonal gammopathy: Secondary | ICD-10-CM | POA: Diagnosis not present

## 2020-01-11 DIAGNOSIS — D473 Essential (hemorrhagic) thrombocythemia: Secondary | ICD-10-CM | POA: Diagnosis not present

## 2020-01-11 DIAGNOSIS — E871 Hypo-osmolality and hyponatremia: Secondary | ICD-10-CM | POA: Diagnosis not present

## 2020-01-11 DIAGNOSIS — I110 Hypertensive heart disease with heart failure: Secondary | ICD-10-CM | POA: Diagnosis not present

## 2020-01-11 DIAGNOSIS — D509 Iron deficiency anemia, unspecified: Secondary | ICD-10-CM | POA: Diagnosis not present

## 2020-01-15 DIAGNOSIS — N1330 Unspecified hydronephrosis: Secondary | ICD-10-CM | POA: Diagnosis not present

## 2020-01-15 DIAGNOSIS — N135 Crossing vessel and stricture of ureter without hydronephrosis: Secondary | ICD-10-CM | POA: Diagnosis not present

## 2020-01-15 DIAGNOSIS — C689 Malignant neoplasm of urinary organ, unspecified: Secondary | ICD-10-CM | POA: Diagnosis not present

## 2020-01-15 DIAGNOSIS — N200 Calculus of kidney: Secondary | ICD-10-CM | POA: Diagnosis not present

## 2020-01-16 DIAGNOSIS — F319 Bipolar disorder, unspecified: Secondary | ICD-10-CM | POA: Diagnosis not present

## 2020-01-16 DIAGNOSIS — I110 Hypertensive heart disease with heart failure: Secondary | ICD-10-CM | POA: Diagnosis not present

## 2020-01-16 DIAGNOSIS — D472 Monoclonal gammopathy: Secondary | ICD-10-CM | POA: Diagnosis not present

## 2020-01-16 DIAGNOSIS — D473 Essential (hemorrhagic) thrombocythemia: Secondary | ICD-10-CM | POA: Diagnosis not present

## 2020-01-16 DIAGNOSIS — F419 Anxiety disorder, unspecified: Secondary | ICD-10-CM | POA: Diagnosis not present

## 2020-01-16 DIAGNOSIS — I5033 Acute on chronic diastolic (congestive) heart failure: Secondary | ICD-10-CM | POA: Diagnosis not present

## 2020-01-16 DIAGNOSIS — J441 Chronic obstructive pulmonary disease with (acute) exacerbation: Secondary | ICD-10-CM | POA: Diagnosis not present

## 2020-01-16 DIAGNOSIS — D4959 Neoplasm of unspecified behavior of other genitourinary organ: Secondary | ICD-10-CM | POA: Diagnosis not present

## 2020-01-16 DIAGNOSIS — D509 Iron deficiency anemia, unspecified: Secondary | ICD-10-CM | POA: Diagnosis not present

## 2020-01-16 LAB — JAK2 GENOTYPR

## 2020-01-18 ENCOUNTER — Telehealth: Payer: Self-pay | Admitting: Physician Assistant

## 2020-01-18 NOTE — Telephone Encounter (Signed)
LMOV to reschedule  

## 2020-01-18 NOTE — Telephone Encounter (Signed)
Pt currently scheduled for follow-up appointment to go over Carotid doppler results with Marrianne Mood, PA on 01/22/20. Carotid doppler is scheduled on 01/29/20. Please call pt to reschedule appt to after 01/29/20.  Thanks!

## 2020-01-19 DIAGNOSIS — I110 Hypertensive heart disease with heart failure: Secondary | ICD-10-CM | POA: Diagnosis not present

## 2020-01-19 DIAGNOSIS — D4959 Neoplasm of unspecified behavior of other genitourinary organ: Secondary | ICD-10-CM | POA: Diagnosis not present

## 2020-01-19 DIAGNOSIS — D472 Monoclonal gammopathy: Secondary | ICD-10-CM | POA: Diagnosis not present

## 2020-01-19 DIAGNOSIS — D473 Essential (hemorrhagic) thrombocythemia: Secondary | ICD-10-CM | POA: Diagnosis not present

## 2020-01-19 DIAGNOSIS — J441 Chronic obstructive pulmonary disease with (acute) exacerbation: Secondary | ICD-10-CM | POA: Diagnosis not present

## 2020-01-19 DIAGNOSIS — I5033 Acute on chronic diastolic (congestive) heart failure: Secondary | ICD-10-CM | POA: Diagnosis not present

## 2020-01-19 DIAGNOSIS — D509 Iron deficiency anemia, unspecified: Secondary | ICD-10-CM | POA: Diagnosis not present

## 2020-01-19 DIAGNOSIS — F319 Bipolar disorder, unspecified: Secondary | ICD-10-CM | POA: Diagnosis not present

## 2020-01-19 DIAGNOSIS — F419 Anxiety disorder, unspecified: Secondary | ICD-10-CM | POA: Diagnosis not present

## 2020-01-22 ENCOUNTER — Ambulatory Visit: Payer: Medicare HMO | Admitting: Physician Assistant

## 2020-01-22 DIAGNOSIS — D472 Monoclonal gammopathy: Secondary | ICD-10-CM | POA: Diagnosis not present

## 2020-01-22 DIAGNOSIS — J441 Chronic obstructive pulmonary disease with (acute) exacerbation: Secondary | ICD-10-CM | POA: Diagnosis not present

## 2020-01-22 DIAGNOSIS — D509 Iron deficiency anemia, unspecified: Secondary | ICD-10-CM | POA: Diagnosis not present

## 2020-01-22 DIAGNOSIS — I5033 Acute on chronic diastolic (congestive) heart failure: Secondary | ICD-10-CM | POA: Diagnosis not present

## 2020-01-22 DIAGNOSIS — I110 Hypertensive heart disease with heart failure: Secondary | ICD-10-CM | POA: Diagnosis not present

## 2020-01-22 DIAGNOSIS — D4959 Neoplasm of unspecified behavior of other genitourinary organ: Secondary | ICD-10-CM | POA: Diagnosis not present

## 2020-01-22 DIAGNOSIS — D473 Essential (hemorrhagic) thrombocythemia: Secondary | ICD-10-CM | POA: Diagnosis not present

## 2020-01-24 ENCOUNTER — Ambulatory Visit: Payer: Medicare HMO | Admitting: Family

## 2020-01-26 DIAGNOSIS — I5033 Acute on chronic diastolic (congestive) heart failure: Secondary | ICD-10-CM | POA: Diagnosis not present

## 2020-01-26 DIAGNOSIS — D509 Iron deficiency anemia, unspecified: Secondary | ICD-10-CM | POA: Diagnosis not present

## 2020-01-26 DIAGNOSIS — F419 Anxiety disorder, unspecified: Secondary | ICD-10-CM | POA: Diagnosis not present

## 2020-01-26 DIAGNOSIS — I110 Hypertensive heart disease with heart failure: Secondary | ICD-10-CM | POA: Diagnosis not present

## 2020-01-26 DIAGNOSIS — F319 Bipolar disorder, unspecified: Secondary | ICD-10-CM | POA: Diagnosis not present

## 2020-01-26 DIAGNOSIS — D4959 Neoplasm of unspecified behavior of other genitourinary organ: Secondary | ICD-10-CM | POA: Diagnosis not present

## 2020-01-26 DIAGNOSIS — D472 Monoclonal gammopathy: Secondary | ICD-10-CM | POA: Diagnosis not present

## 2020-01-26 DIAGNOSIS — D473 Essential (hemorrhagic) thrombocythemia: Secondary | ICD-10-CM | POA: Diagnosis not present

## 2020-01-26 DIAGNOSIS — J441 Chronic obstructive pulmonary disease with (acute) exacerbation: Secondary | ICD-10-CM | POA: Diagnosis not present

## 2020-01-28 NOTE — Progress Notes (Deleted)
[place]  Telephone:(336) [phone] [phone]) [phone]  ID: Pamela James OB: 10-03-1945  MR#: [id number]  VEH#:[id number]  Patient Care Team: Tracie Harrier, MD as PCP - General (Internal Medicine) Minna Merritts, MD as PCP - Cardiology (Cardiology) Claud Kelp, MD (Specialist) System, Provider Not In  I connected with Pamela James on 01/28/20 at  2:30 PM EST by {Blank single:19197::"video enabled telemedicine visit","telephone visit"} and verified that I am speaking with the correct person using two identifiers.   I discussed the limitations, risks, security and privacy concerns of performing an evaluation and management service by telemedicine and the availability of in-person appointments. I also discussed with the patient that there may be a patient responsible charge related to this service. The patient expressed understanding and agreed to proceed.   Other persons participating in the visit and their role in the encounter: Patient, MD.  Patient's location: Home. Provider's location: Clinic.  CHIEF COMPLAINT: Thrombocytosis.  INTERVAL HISTORY: Patient is a 74 year old female who by report has a longstanding history of thrombocytosis and was evaluated at [facility].  She is referred for further evaluation and to transfer care.  She has having left flank pain and is actively being worked up for an abnormality/mass in her left ureter.  She otherwise feels well.  She has no neurologic complaints.  She denies any recent fevers or illnesses.  She has a good appetite and denies weight loss.  She denies any other pain.  She has no chest pain, shortness of breath, cough, or hemoptysis.  She denies any nausea, vomiting, constipation, or diarrhea.  She has no melena or hematochezia.  She has no urinary complaints, but has a recent history of hematuria.  Patient otherwise feels well and offers no further specific complaints today.  REVIEW OF SYSTEMS:   Review of  Systems  Constitutional: Negative.  Negative for fever, malaise/fatigue and weight loss.  Respiratory: Negative.  Negative for cough, hemoptysis and shortness of breath.   Cardiovascular: Negative.  Negative for chest pain and leg swelling.  Gastrointestinal: Negative.  Negative for abdominal pain, blood in stool and melena.  Genitourinary: Positive for flank pain and hematuria.  Musculoskeletal: Negative for back pain.  Skin: Negative.  Negative for rash.  Neurological: Negative.  Negative for dizziness, focal weakness, weakness and headaches.  Psychiatric/Behavioral: Negative.  The patient is not nervous/anxious.     As per HPI. Otherwise, a complete review of systems is negative.  PAST MEDICAL HISTORY: Past Medical History:  Diagnosis Date  . Anxiety   . Asthma   . CHF (congestive heart failure) ([place])   . COPD (chronic obstructive pulmonary disease) ([place])   . Depression   . GERD (gastroesophageal reflux disease)   . Hypertension     PAST SURGICAL HISTORY: Past Surgical History:  Procedure Laterality Date  . ABDOMINAL HYSTERECTOMY    . TOTAL HIP ARTHROPLASTY Left   . TUBAL LIGATION      FAMILY HISTORY: Family History  Problem Relation Age of Onset  . Heart disease Father   . Heart disease Sister     ADVANCED DIRECTIVES (Y/N):  N  HEALTH MAINTENANCE: Social History   Tobacco Use  . Smoking status: Former Smoker    Packs/day: 0.75    Years: 40.00    Pack years: 30.00    Types: Cigarettes    Quit date: 11/05/2019    Years since quitting: 0.2  . Smokeless tobacco: Never Used  Vaping Use  . Vaping Use: Never used  Substance  Use Topics  . Alcohol use: No  . Drug use: No     Colonoscopy:  PAP:  Bone density:  Lipid panel:  Allergies  Allergen Reactions  . Morphine And Related Other (See Comments)    Delerium, itching  . Nsaids Other (See Comments)    GI ulcers  . Aspirin Other (See Comments)    GI ulcers Can tolerate 81 mg daily    Current  Outpatient Medications  Medication Sig Dispense Refill  . albuterol (PROVENTIL HFA) 108 (90 Base) MCG/ACT inhaler Inhale 2 puffs into the lungs every 4 (four) hours as needed for wheezing or shortness of breath. 1 each 0  . albuterol (PROVENTIL) (2.5 MG/3ML) 0.083% nebulizer solution Take 2.5 mg by nebulization every 6 (six) hours as needed for wheezing or shortness of breath.    Marland Kitchen alendronate (FOSAMAX) 70 MG tablet Take 70 mg by mouth once a week. Take with a full glass of water on an empty stomach.    Marland Kitchen amLODipine (NORVASC) 10 MG tablet Take 0.5 tablets (5 mg total) by mouth 2 (two) times daily as needed (Hold for Top blood pressure less than 120). 45 tablet 3  . apixaban (ELIQUIS) 5 MG TABS tablet Take 1 tablet (5 mg total) by mouth 2 (two) times daily. 60 tablet 1  . aspirin EC 81 MG EC tablet Take 1 tablet (81 mg total) by mouth daily.    . bisacodyl (DULCOLAX) 5 MG EC tablet Take by mouth.    . citalopram (CELEXA) 10 MG tablet Take 10 mg by mouth daily.     Mariane Baumgarten Sodium (DSS) 100 MG CAPS Take by mouth daily as needed.     . feeding supplement, ENSURE ENLIVE, (ENSURE ENLIVE) LIQD Take 237 mLs by mouth daily. 237 mL 1  . ferrous sulfate 325 (65 FE) MG EC tablet Take 1 tablet (325 mg total) by mouth 2 (two) times daily. 180 tablet 1  . fluticasone-salmeterol (ADVAIR HFA) 230-21 MCG/ACT inhaler Inhale 2 puffs into the lungs 2 (two) times daily.    . furosemide (LASIX) 20 MG tablet Take 1 tablet (20 mg total) by mouth daily. 90 tablet 1  . gabapentin (NEURONTIN) 300 MG capsule Take 300 mg by mouth daily.     . hydrALAZINE (APRESOLINE) 25 MG tablet Take 25 mg by mouth in the morning and at bedtime.    . Ipratropium-Albuterol (COMBIVENT) 20-100 MCG/ACT AERS respimat Inhale 1 puff into the lungs 4 (four) times daily as needed for wheezing or shortness of breath. 1 each 1  . lidocaine (LIDODERM) 5 % Place 1 patch onto the skin daily. Remove & Discard patch within 12 hours or as directed by MD 30  patch 0  . metoprolol tartrate (LOPRESSOR) 25 MG tablet Take 0.5 tablets (12.5 mg total) by mouth 2 (two) times daily. (Patient taking differently: Take 25 mg by mouth 2 (two) times daily. ) 60 tablet 1  . nitroGLYCERIN (NITROSTAT) 0.4 MG SL tablet Place 1 tablet (0.4 mg total) under the tongue every 5 (five) minutes as needed for chest pain. 30 tablet 0  . omeprazole (PRILOSEC) 20 MG capsule Take 20 mg by mouth daily.    . pravastatin (PRAVACHOL) 20 MG tablet Take 20 mg by mouth at bedtime.     . senna-docusate (SENOKOT-S) 8.6-50 MG tablet Take 1 tablet by mouth 2 (two) times daily between meals as needed for mild constipation. 60 tablet 0  . tamsulosin (FLOMAX) 0.4 MG CAPS capsule Take 0.4 mg by  mouth daily.    . traZODone (DESYREL) 100 MG tablet Take 50-100 mg by mouth at bedtime.    Marland Kitchen umeclidinium bromide (INCRUSE ELLIPTA) 62.5 MCG/INH AEPB Inhale 1 puff into the lungs daily. 1 each 1  . vitamin B-12 (CYANOCOBALAMIN) 1000 MCG tablet Take 1,000 mcg by mouth daily.     No current facility-administered medications for this visit.    OBJECTIVE: There were no vitals filed for this visit.   There is no height or weight on file to calculate BMI.    ECOG FS:0 - Asymptomatic  General: Well-developed, well-nourished, no acute distress. Eyes: Pink conjunctiva, anicteric sclera. HEENT: Normocephalic, moist mucous membranes. Lungs: No audible wheezing or coughing. Heart: Regular rate and rhythm. Abdomen: Soft, nontender, no obvious distention. Musculoskeletal: No edema, cyanosis, or clubbing. Neuro: Alert, answering all questions appropriately. Cranial nerves grossly intact. Skin: No rashes or petechiae noted. Psych: Normal affect. Lymphatics: No cervical, calvicular, axillary or inguinal LAD.   LAB RESULTS:  Lab Results  Component Value Date   NA 136 01/09/2020   K 5.5 (H) 01/09/2020   CL 105 01/09/2020   CO2 23 01/09/2020   GLUCOSE 87 01/09/2020   BUN 29 (H) 01/09/2020   CREATININE  1.00 01/09/2020   CALCIUM 10.0 01/09/2020   PROT 6.9 12/07/2019   ALBUMIN 4.9 (H) 12/07/2019   AST 13 12/07/2019   ALT 13 12/07/2019   ALKPHOS 91 12/07/2019   BILITOT 0.5 12/07/2019   GFRNONAA 59 (L) 01/09/2020   GFRAA 56 (L) 12/21/2019    Lab Results  Component Value Date   WBC 6.2 01/09/2020   NEUTROABS 14.6 (H) 09/17/2019   HGB 12.9 01/09/2020   HCT 37.6 01/09/2020   MCV 91.0 01/09/2020   PLT 530 (H) 01/09/2020     STUDIES: No results found.  ASSESSMENT: Thrombocytosis.  PLAN:    1. Thrombocytosis: Patient's platelet count is 530 today which appears to be approximately her baseline.  The remainder of her laboratory work from today is pending at time of dictation.  Unclear of the work-up she had at [facility], but patient does not report a bone marrow biopsy.  This is not necessary at this time.  No intervention is needed.  Patient will have a video assisted telemedicine visit in 3 weeks to discuss her laboratory results and further evaluation. 2.  Elevated paraprotein: [facility] labs are pending at time of dictation.  No intervention is needed.  No bone marrow biopsy is necessary as above. 3.  Flank pain/possible ureteral mass: Patient has been instructed to continue follow-up at [facility] as scheduled. 4.  Chronic renal insufficiency: Mild, monitor.  I provided *** minutes of {Blank single:19197::"face-to-face video visit time","non face-to-face telephone visit time"} during this encounter which included chart review, counseling, and coordination of care as documented above.   Patient expressed understanding and was in agreement with this plan. She also understands that She can call clinic at any time with any questions, concerns, or complaints.    Lloyd Huger, MD   01/28/2020 9:50 AM

## 2020-01-31 DIAGNOSIS — J441 Chronic obstructive pulmonary disease with (acute) exacerbation: Secondary | ICD-10-CM | POA: Diagnosis not present

## 2020-01-31 DIAGNOSIS — I110 Hypertensive heart disease with heart failure: Secondary | ICD-10-CM | POA: Diagnosis not present

## 2020-01-31 DIAGNOSIS — I5033 Acute on chronic diastolic (congestive) heart failure: Secondary | ICD-10-CM | POA: Diagnosis not present

## 2020-01-31 DIAGNOSIS — F419 Anxiety disorder, unspecified: Secondary | ICD-10-CM | POA: Diagnosis not present

## 2020-01-31 DIAGNOSIS — D4959 Neoplasm of unspecified behavior of other genitourinary organ: Secondary | ICD-10-CM | POA: Diagnosis not present

## 2020-01-31 DIAGNOSIS — D472 Monoclonal gammopathy: Secondary | ICD-10-CM | POA: Diagnosis not present

## 2020-01-31 DIAGNOSIS — D509 Iron deficiency anemia, unspecified: Secondary | ICD-10-CM | POA: Diagnosis not present

## 2020-01-31 DIAGNOSIS — F319 Bipolar disorder, unspecified: Secondary | ICD-10-CM | POA: Diagnosis not present

## 2020-01-31 DIAGNOSIS — D473 Essential (hemorrhagic) thrombocythemia: Secondary | ICD-10-CM | POA: Diagnosis not present

## 2020-02-02 ENCOUNTER — Inpatient Hospital Stay: Payer: Medicare HMO | Admitting: Oncology

## 2020-02-02 DIAGNOSIS — J449 Chronic obstructive pulmonary disease, unspecified: Secondary | ICD-10-CM | POA: Diagnosis not present

## 2020-02-02 DIAGNOSIS — Z87891 Personal history of nicotine dependence: Secondary | ICD-10-CM | POA: Diagnosis not present

## 2020-02-02 DIAGNOSIS — F331 Major depressive disorder, recurrent, moderate: Secondary | ICD-10-CM | POA: Diagnosis not present

## 2020-02-02 DIAGNOSIS — R413 Other amnesia: Secondary | ICD-10-CM | POA: Diagnosis not present

## 2020-02-02 DIAGNOSIS — Z7689 Persons encountering health services in other specified circumstances: Secondary | ICD-10-CM | POA: Diagnosis not present

## 2020-02-02 DIAGNOSIS — E7800 Pure hypercholesterolemia, unspecified: Secondary | ICD-10-CM | POA: Diagnosis not present

## 2020-02-02 DIAGNOSIS — D75839 Thrombocytosis, unspecified: Secondary | ICD-10-CM

## 2020-02-02 DIAGNOSIS — R2681 Unsteadiness on feet: Secondary | ICD-10-CM | POA: Diagnosis not present

## 2020-02-02 DIAGNOSIS — I82722 Chronic embolism and thrombosis of deep veins of left upper extremity: Secondary | ICD-10-CM | POA: Diagnosis not present

## 2020-02-03 NOTE — Progress Notes (Signed)
[place]  [phone]) [phone] [phone]) [phone]  ID: Pamela James OB: 1945/09/15  MR#: [id number]  XKP#:[id number]  Patient Care Team: Tracie Harrier, MD as PCP - General (Internal Medicine) Minna Merritts, MD as PCP - Cardiology (Cardiology) [place], Baird Lyons, MD (Specialist) System, Provider Not In   CHIEF COMPLAINT: Thrombocytosis.  INTERVAL HISTORY: Patient returns to clinic today for further evaluation and discussion of her laboratory results.  She is accompanied by her son today.  She currently feels well and is asymptomatic. She has no neurologic complaints.  She denies any recent fevers or illnesses.  She has a good appetite and denies weight loss.  She does not complain of pain today.  She has no chest pain, shortness of breath, cough, or hemoptysis.  She denies any nausea, vomiting, constipation, or diarrhea.  She has no melena or hematochezia.  She has no urinary complaints, but has a recent history of hematuria.  Patient offers no specific complaints today.  REVIEW OF SYSTEMS:   Review of Systems  Constitutional: Negative.  Negative for fever, malaise/fatigue and weight loss.  Respiratory: Negative.  Negative for cough, hemoptysis and shortness of breath.   Cardiovascular: Negative.  Negative for chest pain and leg swelling.  Gastrointestinal: Negative.  Negative for abdominal pain, blood in stool and melena.  Genitourinary: Negative.  Negative for flank pain and hematuria.  Musculoskeletal: Negative.  Negative for back pain.  Skin: Negative.  Negative for rash.  Neurological: Negative.  Negative for dizziness, focal weakness, weakness and headaches.  Psychiatric/Behavioral: Negative.  The patient is not nervous/anxious.     As per HPI. Otherwise, a complete review of systems is negative.  PAST MEDICAL HISTORY: Past Medical History:  Diagnosis Date  . Anxiety   . Asthma   . CHF (congestive heart failure) ([place])   . COPD (chronic obstructive  pulmonary disease) ([place])   . Depression   . GERD (gastroesophageal reflux disease)   . Hypertension     PAST SURGICAL HISTORY: Past Surgical History:  Procedure Laterality Date  . ABDOMINAL HYSTERECTOMY    . TOTAL HIP ARTHROPLASTY Left   . TUBAL LIGATION      FAMILY HISTORY: Family History  Problem Relation Age of Onset  . Heart disease Father   . Heart disease Sister     ADVANCED DIRECTIVES (Y/N):  N  HEALTH MAINTENANCE: Social History   Tobacco Use  . Smoking status: Former Smoker    Packs/day: 0.75    Years: 40.00    Pack years: 30.00    Types: Cigarettes    Quit date: 11/05/2019    Years since quitting: 0.2  . Smokeless tobacco: Never Used  Vaping Use  . Vaping Use: Never used  Substance Use Topics  . Alcohol use: No  . Drug use: No     Colonoscopy:  PAP:  Bone density:  Lipid panel:  Allergies  Allergen Reactions  . Morphine And Related Other (See Comments)    Delerium, itching  . Nsaids Other (See Comments)    GI ulcers  . Aspirin Other (See Comments)    GI ulcers Can tolerate 81 mg daily    Current Outpatient Medications  Medication Sig Dispense Refill  . albuterol (PROVENTIL HFA) 108 (90 Base) MCG/ACT inhaler Inhale 2 puffs into the lungs every 4 (four) hours as needed for wheezing or shortness of breath. 1 each 0  . albuterol (PROVENTIL) (2.5 MG/3ML) 0.083% nebulizer solution Take 2.5 mg by nebulization every 6 (six) hours as needed for  wheezing or shortness of breath.    Marland Kitchen alendronate (FOSAMAX) 70 MG tablet Take 70 mg by mouth once a week. Take with a full glass of water on an empty stomach.    Marland Kitchen amLODipine (NORVASC) 10 MG tablet Take 0.5 tablets (5 mg total) by mouth 2 (two) times daily as needed (Hold for Top blood pressure less than 120). 45 tablet 3  . apixaban (ELIQUIS) 5 MG TABS tablet Take 1 tablet (5 mg total) by mouth 2 (two) times daily. 60 tablet 1  . bisacodyl (DULCOLAX) 5 MG EC tablet Take by mouth.    . citalopram (CELEXA) 10 MG  tablet Take 10 mg by mouth daily.     Mariane Baumgarten Sodium (DSS) 100 MG CAPS Take by mouth daily as needed.     . feeding supplement, ENSURE ENLIVE, (ENSURE ENLIVE) LIQD Take 237 mLs by mouth daily. 237 mL 1  . ferrous sulfate 325 (65 FE) MG EC tablet Take 1 tablet (325 mg total) by mouth 2 (two) times daily. 180 tablet 1  . fluticasone-salmeterol (ADVAIR HFA) 230-21 MCG/ACT inhaler Inhale 2 puffs into the lungs 2 (two) times daily.    . furosemide (LASIX) 20 MG tablet Take 1 tablet (20 mg total) by mouth daily. 90 tablet 1  . hydrALAZINE (APRESOLINE) 25 MG tablet Take 25 mg by mouth in the morning and at bedtime.    . Ipratropium-Albuterol (COMBIVENT) 20-100 MCG/ACT AERS respimat Inhale 1 puff into the lungs 4 (four) times daily as needed for wheezing or shortness of breath. 1 each 1  . metoprolol tartrate (LOPRESSOR) 25 MG tablet Take 0.5 tablets (12.5 mg total) by mouth 2 (two) times daily. (Patient taking differently: Take 25 mg by mouth 2 (two) times daily. ) 60 tablet 1  . omeprazole (PRILOSEC) 20 MG capsule Take 20 mg by mouth daily.    . pravastatin (PRAVACHOL) 20 MG tablet Take 20 mg by mouth at bedtime.     . senna-docusate (SENOKOT-S) 8.6-50 MG tablet Take 1 tablet by mouth 2 (two) times daily between meals as needed for mild constipation. 60 tablet 0  . tamsulosin (FLOMAX) 0.4 MG CAPS capsule Take 0.4 mg by mouth daily.    . traZODone (DESYREL) 100 MG tablet Take 50-100 mg by mouth at bedtime.    Marland Kitchen umeclidinium bromide (INCRUSE ELLIPTA) 62.5 MCG/INH AEPB Inhale 1 puff into the lungs daily. 1 each 1  . vitamin B-12 (CYANOCOBALAMIN) 1000 MCG tablet Take 1,000 mcg by mouth daily.    Marland Kitchen lidocaine (LIDODERM) 5 % Place 1 patch onto the skin daily. Remove & Discard patch within 12 hours or as directed by MD 30 patch 0  . nitroGLYCERIN (NITROSTAT) 0.4 MG SL tablet Place 1 tablet (0.4 mg total) under the tongue every 5 (five) minutes as needed for chest pain. (Patient not taking: Reported on  02/06/2020) 30 tablet 0   No current facility-administered medications for this visit.    OBJECTIVE: Vitals:   02/06/20 1033  BP: 136/62  Pulse: 68  Resp: 16  Temp: 97.9 F (36.6 C)  SpO2: 100%     Body mass index is 28.38 kg/m.    ECOG FS:0 - Asymptomatic  General: Well-developed, well-nourished, no acute distress. Eyes: Pink conjunctiva, anicteric sclera. HEENT: Normocephalic, moist mucous membranes. Lungs: No audible wheezing or coughing. Heart: Regular rate and rhythm. Abdomen: Soft, nontender, no obvious distention. Musculoskeletal: No edema, cyanosis, or clubbing. Neuro: Alert, answering all questions appropriately. Cranial nerves grossly intact. Skin: No rashes or  petechiae noted. Psych: Normal affect.   LAB RESULTS:  Lab Results  Component Value Date   NA 136 01/09/2020   K 5.5 (H) 01/09/2020   CL 105 01/09/2020   CO2 23 01/09/2020   GLUCOSE 87 01/09/2020   BUN 29 (H) 01/09/2020   CREATININE 1.00 01/09/2020   CALCIUM 10.0 01/09/2020   PROT 6.9 12/07/2019   ALBUMIN 4.9 (H) 12/07/2019   AST 13 12/07/2019   ALT 13 12/07/2019   ALKPHOS 91 12/07/2019   BILITOT 0.5 12/07/2019   GFRNONAA 59 (L) 01/09/2020   GFRAA 56 (L) 12/21/2019    Lab Results  Component Value Date   WBC 6.2 01/09/2020   NEUTROABS 14.6 (H) 09/17/2019   HGB 12.9 01/09/2020   HCT 37.6 01/09/2020   MCV 91.0 01/09/2020   PLT 530 (H) 01/09/2020     STUDIES: No results found.  ASSESSMENT: Thrombocytosis.  PLAN:    1. Thrombocytosis: Patient's most recent platelet count is 530 which has trended down slightly over the past several months.  All of her other laboratory work including JAK2 mutation is either negative or within normal limits.  Suspect this is reactive.  No intervention is needed at this time.  Patient does not require bone marrow biopsy.  No further follow-up has been scheduled.  Please refer patient back if there are any questions or concerns.  2.  Elevated paraprotein:  SPEP is negative.  No further intervention is needed. 3.  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  Per clinic notes, patient elected for simple observation. 4.  Chronic renal insufficiency: Mild, monitor. 5.  Weight gain: Unclear etiology.  Patient is asymptomatic.  Her son is concerned with fluid retention.  Okay to increase Lasix dose by 20 mg for 1 or 2 days.  Have instructed son to ensure primary care is aware and to keep follow-up appointment with them on February 12, 2020.   Patient expressed understanding and was in agreement with this plan. She also understands that She can call clinic at any time with any questions, concerns, or complaints.    Lloyd Huger, MD   02/06/2020 12:58 PM

## 2020-02-05 ENCOUNTER — Ambulatory Visit: Payer: Medicare HMO | Admitting: Physician Assistant

## 2020-02-06 ENCOUNTER — Other Ambulatory Visit: Payer: Self-pay

## 2020-02-06 ENCOUNTER — Inpatient Hospital Stay: Payer: Medicare HMO | Attending: Oncology | Admitting: Oncology

## 2020-02-06 ENCOUNTER — Encounter: Payer: Self-pay | Admitting: Oncology

## 2020-02-06 VITALS — BP 136/62 | HR 68 | Temp 97.9°F | Resp 16 | Wt 160.2 lb

## 2020-02-06 DIAGNOSIS — F419 Anxiety disorder, unspecified: Secondary | ICD-10-CM | POA: Insufficient documentation

## 2020-02-06 DIAGNOSIS — K219 Gastro-esophageal reflux disease without esophagitis: Secondary | ICD-10-CM | POA: Insufficient documentation

## 2020-02-06 DIAGNOSIS — F32A Depression, unspecified: Secondary | ICD-10-CM | POA: Insufficient documentation

## 2020-02-06 DIAGNOSIS — I509 Heart failure, unspecified: Secondary | ICD-10-CM | POA: Diagnosis not present

## 2020-02-06 DIAGNOSIS — N189 Chronic kidney disease, unspecified: Secondary | ICD-10-CM | POA: Diagnosis not present

## 2020-02-06 DIAGNOSIS — I130 Hypertensive heart and chronic kidney disease with heart failure and stage 1 through stage 4 chronic kidney disease, or unspecified chronic kidney disease: Secondary | ICD-10-CM | POA: Diagnosis not present

## 2020-02-06 DIAGNOSIS — J449 Chronic obstructive pulmonary disease, unspecified: Secondary | ICD-10-CM | POA: Insufficient documentation

## 2020-02-06 DIAGNOSIS — D75839 Thrombocytosis, unspecified: Secondary | ICD-10-CM | POA: Insufficient documentation

## 2020-02-06 NOTE — Progress Notes (Signed)
Patients son is here with her. He is very concerned about her weight gain of 8lbs since last clinic visit and is inquiring if lasix dose can be adjusted. She recently had labs at [place] [facility] (Dr. Berdine Addison) and he emailed the doctor about those but has not heard back yet and he is asking about those results.

## 2020-02-10 IMAGING — DX DG CHEST 1V PORT
1 series · 1 of 1 positions shown · non-contrast
Comparison: None.

CLINICAL DATA: Shortness of breath.

EXAM:
PORTABLE CHEST 1 VIEW

[chest ap]
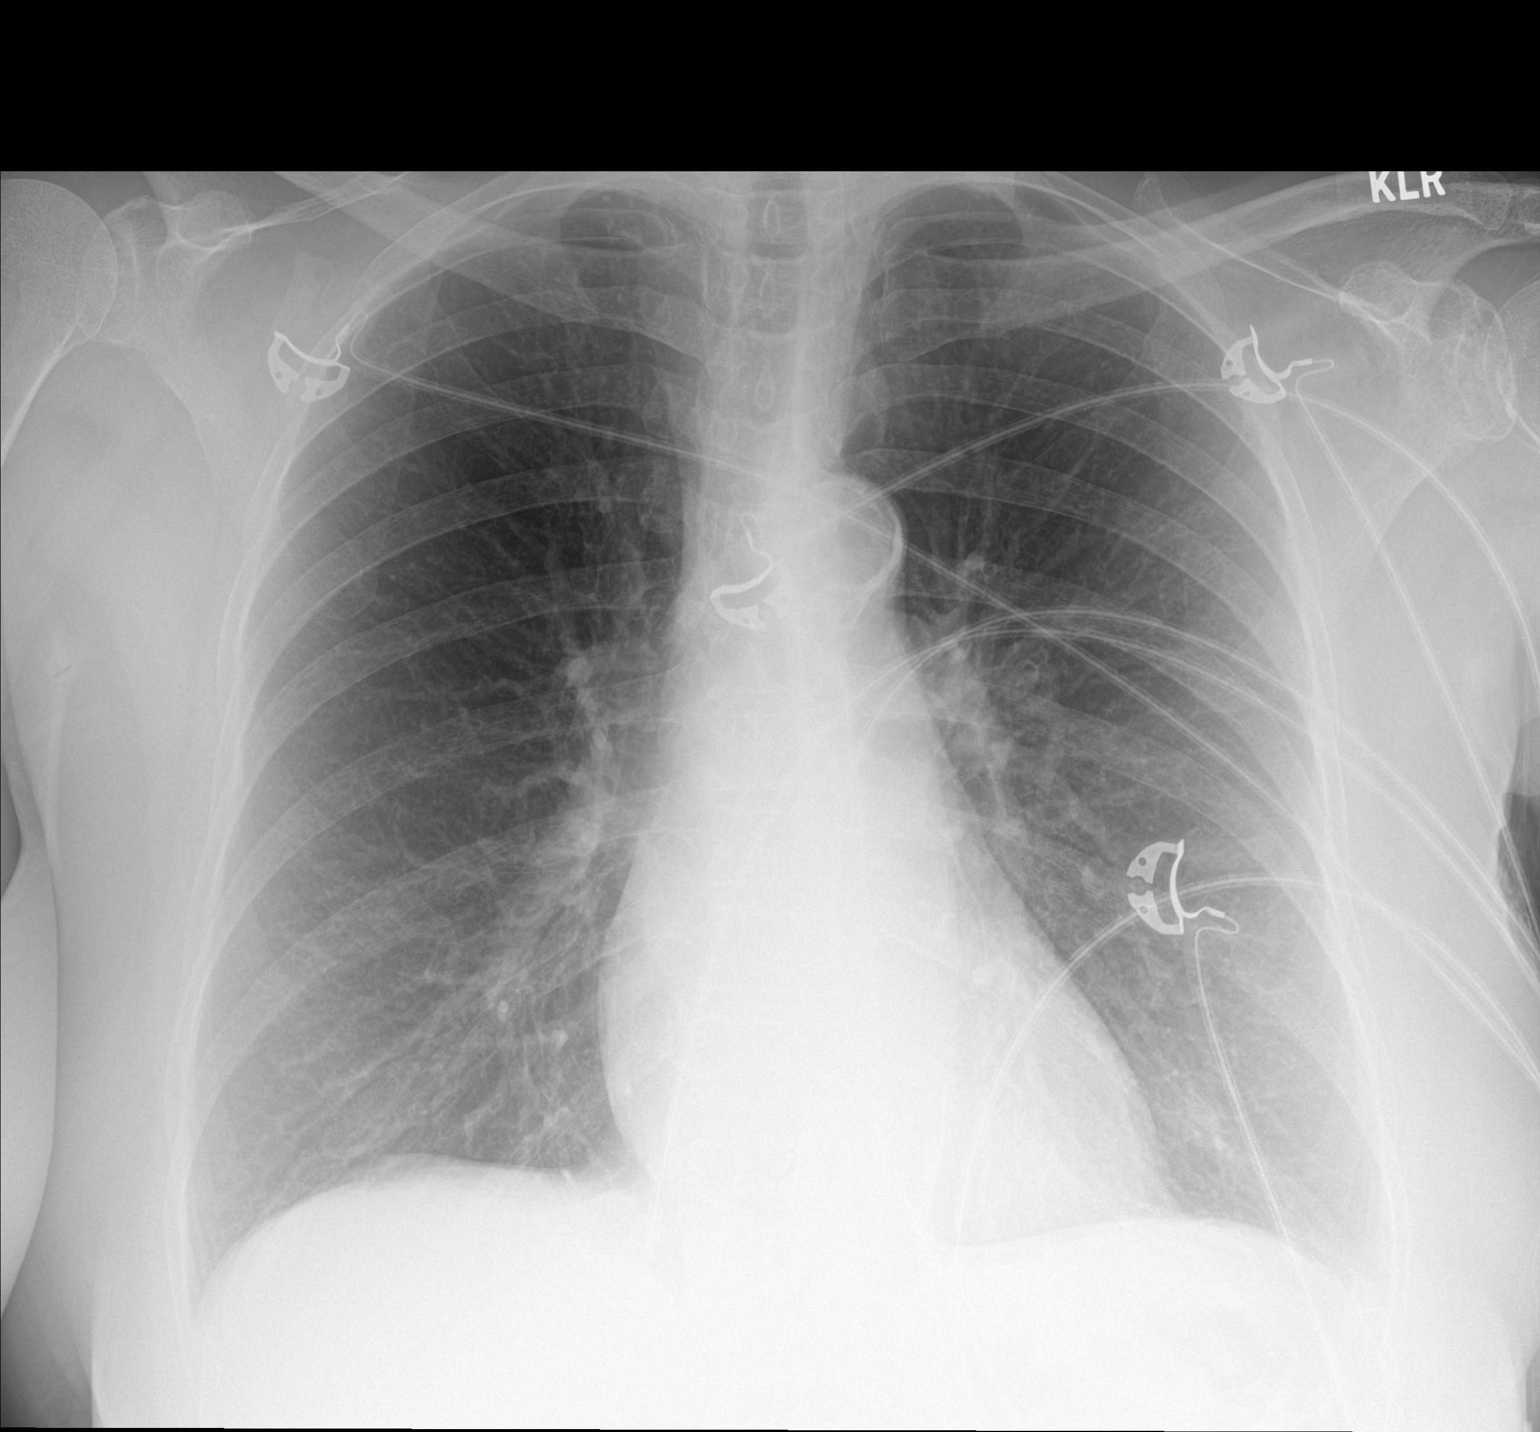

[1 of 1 positions shown; findings below may reference images not displayed]

FINDINGS: The heart size and mediastinal contours are within normal limits.
Both lungs are clear. The visualized skeletal structures are
unremarkable.
IMPRESSION: No active disease.

## 2020-02-12 ENCOUNTER — Telehealth: Payer: Self-pay | Admitting: Cardiovascular Disease

## 2020-02-12 DIAGNOSIS — J449 Chronic obstructive pulmonary disease, unspecified: Secondary | ICD-10-CM | POA: Diagnosis not present

## 2020-02-12 DIAGNOSIS — F411 Generalized anxiety disorder: Secondary | ICD-10-CM | POA: Diagnosis not present

## 2020-02-12 DIAGNOSIS — Z79899 Other long term (current) drug therapy: Secondary | ICD-10-CM | POA: Diagnosis not present

## 2020-02-12 DIAGNOSIS — I5030 Unspecified diastolic (congestive) heart failure: Secondary | ICD-10-CM | POA: Diagnosis not present

## 2020-02-12 DIAGNOSIS — F339 Major depressive disorder, recurrent, unspecified: Secondary | ICD-10-CM | POA: Diagnosis not present

## 2020-02-12 DIAGNOSIS — Z87891 Personal history of nicotine dependence: Secondary | ICD-10-CM | POA: Diagnosis not present

## 2020-02-12 DIAGNOSIS — M810 Age-related osteoporosis without current pathological fracture: Secondary | ICD-10-CM | POA: Diagnosis not present

## 2020-02-12 DIAGNOSIS — I110 Hypertensive heart disease with heart failure: Secondary | ICD-10-CM | POA: Diagnosis not present

## 2020-02-12 NOTE — Telephone Encounter (Signed)
Venora Maples, patient family member calling  Would like to know if patient can have labs checked - would need orders placed Please call to discuss

## 2020-02-12 NOTE — Telephone Encounter (Signed)
Spoke to pt's son, Venora Maples. Pt has appt today w/ her PCP, Dr. Ginette Pitman. Venora Maples wanted to make sure pt's potassium and iron are being managed as frequently as needed. Pt will have family member with her at PCP appt today and will ask about having Bmet and Iron checked. Pt has follow up with our office 03/25/20. Venora Maples states if lab work is done today and is normal/baseline then he is satisfied with waiting for pt's follow up with our office/cardiology in January to recheck lab work if needed.

## 2020-02-14 DIAGNOSIS — I5033 Acute on chronic diastolic (congestive) heart failure: Secondary | ICD-10-CM | POA: Diagnosis not present

## 2020-02-14 DIAGNOSIS — D473 Essential (hemorrhagic) thrombocythemia: Secondary | ICD-10-CM | POA: Diagnosis not present

## 2020-02-14 DIAGNOSIS — D509 Iron deficiency anemia, unspecified: Secondary | ICD-10-CM | POA: Diagnosis not present

## 2020-02-14 DIAGNOSIS — J441 Chronic obstructive pulmonary disease with (acute) exacerbation: Secondary | ICD-10-CM | POA: Diagnosis not present

## 2020-02-14 DIAGNOSIS — F319 Bipolar disorder, unspecified: Secondary | ICD-10-CM | POA: Diagnosis not present

## 2020-02-14 DIAGNOSIS — F419 Anxiety disorder, unspecified: Secondary | ICD-10-CM | POA: Diagnosis not present

## 2020-02-14 DIAGNOSIS — I110 Hypertensive heart disease with heart failure: Secondary | ICD-10-CM | POA: Diagnosis not present

## 2020-02-14 DIAGNOSIS — D4959 Neoplasm of unspecified behavior of other genitourinary organ: Secondary | ICD-10-CM | POA: Diagnosis not present

## 2020-02-14 DIAGNOSIS — D472 Monoclonal gammopathy: Secondary | ICD-10-CM | POA: Diagnosis not present

## 2020-02-15 DIAGNOSIS — D473 Essential (hemorrhagic) thrombocythemia: Secondary | ICD-10-CM | POA: Diagnosis not present

## 2020-02-15 DIAGNOSIS — F419 Anxiety disorder, unspecified: Secondary | ICD-10-CM | POA: Diagnosis not present

## 2020-02-15 DIAGNOSIS — D4959 Neoplasm of unspecified behavior of other genitourinary organ: Secondary | ICD-10-CM | POA: Diagnosis not present

## 2020-02-15 DIAGNOSIS — I110 Hypertensive heart disease with heart failure: Secondary | ICD-10-CM | POA: Diagnosis not present

## 2020-02-15 DIAGNOSIS — F319 Bipolar disorder, unspecified: Secondary | ICD-10-CM | POA: Diagnosis not present

## 2020-02-15 DIAGNOSIS — D509 Iron deficiency anemia, unspecified: Secondary | ICD-10-CM | POA: Diagnosis not present

## 2020-02-15 DIAGNOSIS — D472 Monoclonal gammopathy: Secondary | ICD-10-CM | POA: Diagnosis not present

## 2020-02-15 DIAGNOSIS — I5033 Acute on chronic diastolic (congestive) heart failure: Secondary | ICD-10-CM | POA: Diagnosis not present

## 2020-02-15 DIAGNOSIS — J441 Chronic obstructive pulmonary disease with (acute) exacerbation: Secondary | ICD-10-CM | POA: Diagnosis not present

## 2020-02-19 NOTE — Progress Notes (Deleted)
Patient ID: Pamela James, female    DOB: [birth year], 74 y.o.   MRN: [id number]  HPI  Pamela James is a 74 y/o female with a history of asthma, HTN, COPD, depression, anxiety, recent tobacco use and chronic heart failure.   Echo report from 11/10/19 reviewed and showed an EF of 60-65% along with mild LVH.   Was in the ED 12/15/19 due to elevated potassium level. Unsure of accuracy of this lab result. Labs rechecked with normal potassium level and she was released. Was in the ED 11/17/19 due to dizziness and shortness of breath. CXR showed evidence of COPD. Given breathing treatment with improvement of symptoms. Released with prednisone and antibiotics. Admitted 11/09/19 due to HF exacerbation and AKI. Cardiology and nephrology consults obtained. Given IV diuretics with transition to oral medication along with treatment of electrolyte imbalances. Discharged after 2 days. Was in the ED 10/27/19 due abnormal potassium level. Potassium rechecked and was normal and she was released.   She presents today for a follow-up visit with a chief complaint of   Past Medical History:  Diagnosis Date  . Anxiety   . Asthma   . CHF (congestive heart failure) ([place])   . COPD (chronic obstructive pulmonary disease) ([place])   . Depression   . GERD (gastroesophageal reflux disease)   . Hypertension    Past Surgical History:  Procedure Laterality Date  . ABDOMINAL HYSTERECTOMY    . TOTAL HIP ARTHROPLASTY Left   . TUBAL LIGATION     Family History  Problem Relation Age of Onset  . Heart disease Father   . Heart disease Sister    Social History   Tobacco Use  . Smoking status: Former Smoker    Packs/day: 0.75    Years: 40.00    Pack years: 30.00    Types: Cigarettes    Quit date: 11/05/2019    Years since quitting: 0.2  . Smokeless tobacco: Never Used  Substance Use Topics  . Alcohol use: No   Allergies  Allergen Reactions  . Morphine And Related Other (See Comments)    Delerium, itching  . Nsaids Other  (See Comments)    GI ulcers  . Aspirin Other (See Comments)    GI ulcers Can tolerate 81 mg daily     Review of Systems  Constitutional: Positive for fatigue (minimal). Negative for appetite change.  HENT: Negative for congestion, postnasal drip and sore throat.   Eyes: Negative.   Respiratory: Positive for shortness of breath (minimal). Negative for cough and chest tightness.   Cardiovascular: Positive for palpitations (at times). Negative for chest pain and leg swelling.  Gastrointestinal: Negative for abdominal distention and abdominal pain.  Endocrine: Negative.   Genitourinary: Negative.   Musculoskeletal: Negative for back pain and neck pain.  Skin: Negative.   Allergic/Immunologic: Negative.   Neurological: Negative for dizziness and light-headedness.  Hematological: Negative for adenopathy. Does not bruise/bleed easily.  Psychiatric/Behavioral: Negative for dysphoric mood and sleep disturbance (sleeping on 2 pillows). The patient is not nervous/anxious.      Physical Exam Vitals and nursing note reviewed.  Constitutional:      Appearance: She is well-developed.  HENT:     Head: Normocephalic and atraumatic.  Neck:     Vascular: No JVD.  Cardiovascular:     Rate and Rhythm: Regular rhythm. Bradycardia present.  Pulmonary:     Effort: Pulmonary effort is normal. No respiratory distress.     Breath sounds: No wheezing or rales.  Abdominal:  Palpations: Abdomen is soft.     Tenderness: There is no abdominal tenderness.  Musculoskeletal:     Cervical back: Neck supple.     Right lower leg: No tenderness. No edema.     Left lower leg: No tenderness. No edema.  Skin:    General: Skin is warm and dry.  Neurological:     General: No focal deficit present.     Mental Status: She is alert and oriented to person, place, and time.  Psychiatric:        Mood and Affect: Mood normal.        Behavior: Behavior normal.    Assessment & Plan:  1: Chronic heart failure  with preserved ejection fraction with structural changes (LVH)- - NYHA class II - euvolemic today - weighing daily; reminded to call for an overnight weight gain of > 2 pounds or a weekly weight gain of > 5 pounds - weight 150.4 from last visit here 3 months ago - not adding salt and is using Mrs. Dash for seasoning - saw cardiology Pamela James) 12/21/19 - consider changing her lisinopril to entresto in the future - BNP 11/09/19 was 860.2 - reports receiving both covid vaccines  2: HTN- - BP  - saw PCP (Pamela James) 02/12/20 - BMP 02/02/20 reviewed and showed sodium 140, potassium 4.6, creatinine 1.2 & GFR 53  3: COPD- - recently stopped smoking & she was congratulated on that.  - does use her nebulizer and inhalers during the day   Patient did not bring her medications nor a list. Each medication was verbally reviewed with the patient and she was encouraged to bring the bottles to every visit to confirm accuracy of list.

## 2020-02-20 ENCOUNTER — Ambulatory Visit: Payer: Medicare HMO | Admitting: Family

## 2020-02-26 ENCOUNTER — Ambulatory Visit: Payer: Medicare HMO | Admitting: Physician Assistant

## 2020-03-01 DIAGNOSIS — D473 Essential (hemorrhagic) thrombocythemia: Secondary | ICD-10-CM | POA: Diagnosis not present

## 2020-03-01 DIAGNOSIS — D509 Iron deficiency anemia, unspecified: Secondary | ICD-10-CM | POA: Diagnosis not present

## 2020-03-01 DIAGNOSIS — F419 Anxiety disorder, unspecified: Secondary | ICD-10-CM | POA: Diagnosis not present

## 2020-03-01 DIAGNOSIS — F319 Bipolar disorder, unspecified: Secondary | ICD-10-CM | POA: Diagnosis not present

## 2020-03-01 DIAGNOSIS — J441 Chronic obstructive pulmonary disease with (acute) exacerbation: Secondary | ICD-10-CM | POA: Diagnosis not present

## 2020-03-01 DIAGNOSIS — I110 Hypertensive heart disease with heart failure: Secondary | ICD-10-CM | POA: Diagnosis not present

## 2020-03-01 DIAGNOSIS — D472 Monoclonal gammopathy: Secondary | ICD-10-CM | POA: Diagnosis not present

## 2020-03-01 DIAGNOSIS — I5033 Acute on chronic diastolic (congestive) heart failure: Secondary | ICD-10-CM | POA: Diagnosis not present

## 2020-03-01 DIAGNOSIS — D4959 Neoplasm of unspecified behavior of other genitourinary organ: Secondary | ICD-10-CM | POA: Diagnosis not present

## 2020-03-04 ENCOUNTER — Encounter: Payer: Self-pay | Admitting: Family

## 2020-03-04 ENCOUNTER — Ambulatory Visit: Payer: Medicare HMO | Attending: Family | Admitting: Family

## 2020-03-04 ENCOUNTER — Other Ambulatory Visit: Payer: Self-pay

## 2020-03-04 ENCOUNTER — Ambulatory Visit: Payer: Medicare HMO

## 2020-03-04 VITALS — BP 183/51 | HR 68 | Resp 18 | Ht 63.0 in | Wt 164.2 lb

## 2020-03-04 DIAGNOSIS — Z7951 Long term (current) use of inhaled steroids: Secondary | ICD-10-CM | POA: Insufficient documentation

## 2020-03-04 DIAGNOSIS — I110 Hypertensive heart disease with heart failure: Secondary | ICD-10-CM | POA: Diagnosis not present

## 2020-03-04 DIAGNOSIS — J449 Chronic obstructive pulmonary disease, unspecified: Secondary | ICD-10-CM | POA: Diagnosis not present

## 2020-03-04 DIAGNOSIS — Z7901 Long term (current) use of anticoagulants: Secondary | ICD-10-CM | POA: Insufficient documentation

## 2020-03-04 DIAGNOSIS — I5032 Chronic diastolic (congestive) heart failure: Secondary | ICD-10-CM

## 2020-03-04 DIAGNOSIS — I10 Essential (primary) hypertension: Secondary | ICD-10-CM

## 2020-03-04 DIAGNOSIS — Z87891 Personal history of nicotine dependence: Secondary | ICD-10-CM | POA: Insufficient documentation

## 2020-03-04 LAB — BASIC METABOLIC PANEL
Anion gap: 9 (ref 5–15)
BUN: 37 mg/dL — ABNORMAL HIGH (ref 8–23)
CO2: 25 mmol/L (ref 22–32)
Calcium: 10 mg/dL (ref 8.9–10.3)
Chloride: 105 mmol/L (ref 98–111)
Creatinine, Ser: 1.19 mg/dL — ABNORMAL HIGH (ref 0.44–1.00)
GFR, Estimated: 48 mL/min — ABNORMAL LOW (ref >60)
Glucose, Bld: 90 mg/dL (ref 70–99)
Potassium: 4.5 mmol/L (ref 3.5–5.1)
Sodium: 139 mmol/L (ref 135–145)

## 2020-03-04 NOTE — Patient Instructions (Addendum)
Continue weighing every morning and call for an overnight weight gain of > 2 pounds or a weekly weight gain of >5 pounds.   Call us in the future if you need [place]

## 2020-03-04 NOTE — Progress Notes (Signed)
Patient ID: Pamela James, female    DOB: 01/30/1946, 74 y.o.   MRN: [id number]  HPI  Ms Foisy is a 74 y/o female with a history of asthma, HTN, COPD, depression, anxiety, recent tobacco use and chronic heart failure.   Echo report from 11/10/19 reviewed and showed an EF of 60-65% along with mild LVH.   Was in the ED 12/15/19 due to elevated potassium level. Unsure of accuracy of this lab result. Labs rechecked with normal potassium level and she was released. Was in the ED 11/17/19 due to dizziness and shortness of breath. CXR showed evidence of COPD. Given breathing treatment with improvement of symptoms. Released with prednisone and antibiotics. Admitted 11/09/19 due to HF exacerbation and AKI. Cardiology and nephrology consults obtained. Given IV diuretics with transition to oral medication along with treatment of electrolyte imbalances. Discharged after 2 days. Was in the ED 10/27/19 due abnormal potassium level. Potassium rechecked and was normal and she was released.   She presents today for a follow-up visit with a chief complaint of minimal shortness of breath upon moderate exertion. She describes this as chronic in nature having been present for several years. She has associated fatigue, intermittent palpitations and gradual weight gain along with this. She denies any difficulty sleeping, dizziness, abdominal distention, pedal edema, chest pain or cough.   She says that it's been 5 months since she smoked and she's noticed that she's gradually picked up weight because she's snacking on cookies etc and isn't being as active.   Past Medical History:  Diagnosis Date  . Anxiety   . Asthma   . CHF (congestive heart failure) ([place])   . COPD (chronic obstructive pulmonary disease) ([place])   . Depression   . GERD (gastroesophageal reflux disease)   . Hypertension    Past Surgical History:  Procedure Laterality Date  . ABDOMINAL HYSTERECTOMY    . TOTAL HIP ARTHROPLASTY Left   . TUBAL LIGATION      Family History  Problem Relation Age of Onset  . Heart disease Father   . Heart disease Sister    Social History   Tobacco Use  . Smoking status: Former Smoker    Packs/day: 0.75    Years: 40.00    Pack years: 30.00    Types: Cigarettes    Quit date: 11/05/2019    Years since quitting: 0.3  . Smokeless tobacco: Never Used  Substance Use Topics  . Alcohol use: No   Allergies  Allergen Reactions  . Morphine And Related Other (See Comments)    Delerium, itching  . Nsaids Other (See Comments)    GI ulcers  . Aspirin Other (See Comments)    GI ulcers Can tolerate 81 mg daily   Prior to Admission medications   Medication Sig Start Date End Date Taking? Authorizing Provider  albuterol (PROVENTIL HFA) 108 (90 Base) MCG/ACT inhaler Inhale 2 puffs into the lungs every 4 (four) hours as needed for wheezing or shortness of breath. 11/17/19  Yes Carrie Mew, MD  albuterol (PROVENTIL) (2.5 MG/3ML) 0.083% nebulizer solution Take 2.5 mg by nebulization every 6 (six) hours as needed for wheezing or shortness of breath.   Yes [provider]  alendronate (FOSAMAX) 70 MG tablet Take 70 mg by mouth once a week. Take with a full glass of water on an empty stomach.   Yes [provider]  amLODipine (NORVASC) 10 MG tablet Take 0.5 tablets (5 mg total) by mouth 2 (two) times daily as needed (  Hold for Top blood pressure less than 120). 12/21/19  Yes Visser, Jacquelyn D, PA-C  apixaban (ELIQUIS) 5 MG TABS tablet Take 1 tablet (5 mg total) by mouth 2 (two) times daily. 09/29/19  Yes Nicole Kindred A, DO  bisacodyl (DULCOLAX) 5 MG EC tablet Take by mouth.   Yes [provider]  citalopram (CELEXA) 10 MG tablet Take 10 mg by mouth daily.  12/06/19 12/05/20 Yes [provider]  Docusate Sodium (DSS) 100 MG CAPS Take by mouth daily as needed.    Yes [provider]  feeding supplement, ENSURE ENLIVE, (ENSURE ENLIVE) LIQD Take 237 mLs by mouth daily. 09/27/19   Yes Nicole Kindred A, DO  ferrous sulfate 325 (65 FE) MG EC tablet Take 1 tablet (325 mg total) by mouth 2 (two) times daily. 11/11/19 05/09/20 Yes Mercy Riding, MD  fluticasone-salmeterol (ADVAIR HFA) 230-21 MCG/ACT inhaler Inhale 2 puffs into the lungs 2 (two) times daily.   Yes [provider]  furosemide (LASIX) 20 MG tablet Take 1 tablet (20 mg total) by mouth daily. 11/13/19 05/11/20 Yes Mercy Riding, MD  gabapentin (NEURONTIN) 300 MG capsule Take 300 mg by mouth 2 (two) times daily.   Yes [provider]  hydrALAZINE (APRESOLINE) 25 MG tablet Take 25 mg by mouth in the morning and at bedtime.   Yes [provider]  Ipratropium-Albuterol (COMBIVENT) 20-100 MCG/ACT AERS respimat Inhale 1 puff into the lungs 4 (four) times daily as needed for wheezing or shortness of breath. 11/11/19  Yes Gonfa, Charlesetta Ivory, MD  metoprolol tartrate (LOPRESSOR) 25 MG tablet Take 0.5 tablets (12.5 mg total) by mouth 2 (two) times daily. Patient taking differently: Take 25 mg by mouth 2 (two) times daily. 11/11/19  Yes Mercy Riding, MD  nitroGLYCERIN (NITROSTAT) 0.4 MG SL tablet Place 1 tablet (0.4 mg total) under the tongue every 5 (five) minutes as needed for chest pain. 09/27/19  Yes Nicole Kindred A, DO  omeprazole (PRILOSEC) 20 MG capsule Take 20 mg by mouth daily.   Yes [provider]  pravastatin (PRAVACHOL) 20 MG tablet Take 20 mg by mouth at bedtime.    Yes [provider]  senna-docusate (SENOKOT-S) 8.6-50 MG tablet Take 1 tablet by mouth 2 (two) times daily between meals as needed for mild constipation. 11/11/19  Yes Mercy Riding, MD  tamsulosin (FLOMAX) 0.4 MG CAPS capsule Take 0.4 mg by mouth daily.   Yes [provider]  traZODone (DESYREL) 100 MG tablet Take 50-100 mg by mouth at bedtime. 11/06/19  Yes [provider]  umeclidinium bromide (INCRUSE ELLIPTA) 62.5 MCG/INH AEPB Inhale 1 puff into the lungs daily. 11/11/19  Yes Mercy Riding, MD   vitamin B-12 (CYANOCOBALAMIN) 1000 MCG tablet Take 1,000 mcg by mouth daily.   Yes [provider]  lidocaine (LIDODERM) 5 % Place 1 patch onto the skin daily. Remove & Discard patch within 12 hours or as directed by MD 09/28/19   Ezekiel Slocumb, DO    Review of Systems  Constitutional: Positive for fatigue (minimal). Negative for appetite change.  HENT: Negative for congestion, postnasal drip and sore throat.   Eyes: Negative.   Respiratory: Positive for shortness of breath (minimal). Negative for cough and chest tightness.   Cardiovascular: Positive for palpitations (at times). Negative for chest pain and leg swelling.  Gastrointestinal: Negative for abdominal distention and abdominal pain.  Endocrine: Negative.   Genitourinary: Negative.   Musculoskeletal: Negative for back pain and neck  pain.  Skin: Negative.   Allergic/Immunologic: Negative.   Neurological: Negative for dizziness and light-headedness.  Hematological: Negative for adenopathy. Does not bruise/bleed easily.  Psychiatric/Behavioral: Negative for dysphoric mood and sleep disturbance (sleeping on 2 pillows). The patient is not nervous/anxious.    Vitals:   03/04/20 0823  BP: (!) 183/51  Pulse: 68  Resp: 18  SpO2: 99%  Weight: 164 lb 4 oz (74.5 kg)  Height: 5\' 3"  (1.6 m)   Wt Readings from Last 3 Encounters:  03/04/20 164 lb 4 oz (74.5 kg)  02/06/20 160 lb 3 oz (72.7 kg)  01/09/20 152 lb 11.2 oz (69.3 kg)   Lab Results  Component Value Date   CREATININE 1.00 01/09/2020   CREATININE 1.11 (H) 12/21/2019   CREATININE 1.14 (H) 12/15/2019    Physical Exam Vitals and nursing note reviewed.  Constitutional:      Appearance: She is well-developed.  HENT:     Head: Normocephalic and atraumatic.  Neck:     Vascular: No JVD.  Cardiovascular:     Rate and Rhythm: Normal rate and regular rhythm.  Pulmonary:     Effort: Pulmonary effort is normal. No respiratory distress.     Breath sounds: No  wheezing or rales.  Abdominal:     Palpations: Abdomen is soft.     Tenderness: There is no abdominal tenderness.  Musculoskeletal:     Cervical back: Neck supple.     Right lower leg: No tenderness. No edema.     Left lower leg: No tenderness. No edema.  Skin:    General: Skin is warm and dry.  Neurological:     General: No focal deficit present.     Mental Status: She is alert and oriented to person, place, and time.  Psychiatric:        Mood and Affect: Mood normal.        Behavior: Behavior normal.    Assessment & Plan:  1: Chronic heart failure with preserved ejection fraction with structural changes (LVH)- - NYHA class II - euvolemic today - weighing daily; reminded to call for an overnight weight gain of > 2 pounds or a weekly weight gain of > 5 pounds - weight up 14 pounds from last visit here 3 months ago; she says that she's gradually picked up weight since she stopped smoking - not adding salt and is using Mrs. Dash for seasoning - saw cardiology Mickle Plumb) 12/21/19 - BNP 11/09/19 was 860.2 - reports receiving both covid vaccines and booster - received flu vaccine for this season  2: HTN- - BP elevated but she hasn't taken any of her medications yet today and she's gained 14 pounds in the last 3 months - saw PCP (Hande) 02/12/20 - BMP 02/02/20 reviewed and showed sodium 140, potassium 4.6, creatinine 1.2 & GFR 53 - will recheck BMP today per patient's preference as she says it wasn't checked recently at PCP's visit  3: COPD- - continues to not smoke; been 5 months now (July 2021) - does use her nebulizer and inhalers during the day   Patient did not bring her medications nor a list. Each medication was verbally reviewed with the patient and she was encouraged to bring the bottles to every visit to confirm accuracy of list.  Due to HF stability, will not make a return appointment for patient at this time. Advised her that she could call back at anytime in the future  to schedule another appointment or for any questions and she  was comfortable with this plan.

## 2020-03-12 DIAGNOSIS — D473 Essential (hemorrhagic) thrombocythemia: Secondary | ICD-10-CM | POA: Diagnosis not present

## 2020-03-12 DIAGNOSIS — J441 Chronic obstructive pulmonary disease with (acute) exacerbation: Secondary | ICD-10-CM | POA: Diagnosis not present

## 2020-03-12 DIAGNOSIS — F419 Anxiety disorder, unspecified: Secondary | ICD-10-CM | POA: Diagnosis not present

## 2020-03-12 DIAGNOSIS — F319 Bipolar disorder, unspecified: Secondary | ICD-10-CM | POA: Diagnosis not present

## 2020-03-12 DIAGNOSIS — D472 Monoclonal gammopathy: Secondary | ICD-10-CM | POA: Diagnosis not present

## 2020-03-12 DIAGNOSIS — D509 Iron deficiency anemia, unspecified: Secondary | ICD-10-CM | POA: Diagnosis not present

## 2020-03-12 DIAGNOSIS — I110 Hypertensive heart disease with heart failure: Secondary | ICD-10-CM | POA: Diagnosis not present

## 2020-03-12 DIAGNOSIS — D4959 Neoplasm of unspecified behavior of other genitourinary organ: Secondary | ICD-10-CM | POA: Diagnosis not present

## 2020-03-12 DIAGNOSIS — I5033 Acute on chronic diastolic (congestive) heart failure: Secondary | ICD-10-CM | POA: Diagnosis not present

## 2020-03-24 NOTE — Progress Notes (Addendum)
Office Visit    Patient Name: Pamela James Date of Encounter: 03/25/2020  Primary Care Provider:  Tracie Harrier, MD Primary Cardiologist:  Ida Rogue, MD  Chief Complaint    Chief Complaint  Patient presents with   Follow-up    1 month; discuss carotid u/s. "doing well."     75 y.o. 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  Past Medical History    Past Medical History:  Diagnosis Date   Anxiety    Asthma    CHF (congestive heart failure) (HCC)    COPD (chronic obstructive pulmonary disease) (HCC)    Depression    GERD (gastroesophageal reflux disease)    Hypertension    Past Surgical History:  Procedure Laterality Date   ABDOMINAL HYSTERECTOMY     TOTAL HIP ARTHROPLASTY Left    TUBAL LIGATION      Allergies  Allergies  Allergen Reactions   Morphine And Related Other (See Comments)    Delerium, itching   Nsaids Other (See Comments)    GI ulcers   Aspirin Other (See Comments)    GI ulcers Can tolerate 81 mg daily    History of Present Illness    Pamela James is a 75 y.o. female with PMH as above and including hyperlipidemia, hypertension, hyperparathyroidism, anxiety/depression/bipolar, COPD, asthma, GERD, MGUS, left hydronephrosis s/p left ureteroscopy on 09/12/2019 with stent and tumor biopsy. She has been followed closely by urology for gross hematuria and left-sided hydronephrosis.  She had a cytourethroscopy done 09/12/2019 and stent placement/biopsy of the ureter stent.  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  She follows with hematology for thrombocytosis and MGUS.    She was admitted 09/15/2019-09/27/19 to [facility] after an episode of syncope with bradycardia that resulted in CPR. Admission complicated by aspiration pneumonia with Unasyn, pulmonary edema requiring IV Lasix, left subclavian clot, hypertensive urgency, ?alcohol withdrawal, metabolic encephalopathy, and bradycardia/hypotension. She was intubated and admitted to ICU .  She was hypotensive and bradycardic, requiring Precedex and dopamine drips.  She was started on Eliquis for left subclavian clot.  She was extubated and transferred out of the ICU; however, due to hypertensive urgency, she required transfer back to the ICU for nicardipine drip.  She received IV lasix for pulmonary vascular congestion with improvement. She was discharged home to live with her son 09/27/2019.  09/16/2019 ultrasound of the upper left extremity showed partially occluded thrombus of left subclavian vein.  She also has occlusive thrombus of the axillary vein that is noncompressible, occlusive thrombus of the cephalic vein, which is also noncompressible, occlusive thrombus of the brachial veins, occlusive thrombus of the radial veins, occlusive thrombus of the ulnar veins.  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  Occlusive of the axillary paired brachial veins, and nonocclusive at the subclavian vein.  Superficial occlusive thrombophlebitis of the cephalic vein, basilic vein, radial vein, and ulnar vein.   On 11/09/2019, she presented to the emergency department and found to be hypoxic, volume overloaded, and hyperkalemic with ARF and severe left hydronephrosis.  CT showed new moderately severe left hydronephrosis  with mid and distal left ureter showing possible hemorrhage  by tumor within the ureter. Noted was small bilateral pleural effusions and improved bibasilar airspace disease and right renal scarring with 3 nonobstructing stones.  At presentation,  intermittent bradycardia was noted with atropine administered.  Junctional rhythm at 46 bpm noted and thought due to to electrolyte abnormality. With improved electrolytes (hyponatremia, hyperkalemia), repeat EKG showed improved rate and resolution of junctional rhythm.    Limited echo was performed due to electrical alternans on telemetry and echo as below and showed EF 60 to 65%, mild LVH. PRN lasix was provided for wt gain.   She was seen by the heart failure clinic 11/23/2019.  At that time, she reported minimal DOE with palpitations.  Noted was to possibly change lisinopril to [facility] in the future.  She had recently stopped smoking. Seen by her PCP on 11/29/2019. She reported trouble with her memory since admission 09/2019. She had exertional CP and occasional numbness and pain along her left arm.  On 12/07/19, she presented to clinic with her son and noted labile pressures, ongoing palpitations, headache, occasional dizziness, and blurry vision 3-4x / week with elevated BP.  She had fatigue, paresthesias, and overall decreased energy.  She was holding hydralazine for SBP 110 or below. She was using Mrs. Dash, though reportedly in very large amounts.  She was drinking 1 glass of water per day.  She had significantly cut back on her soda intake.  Junctional bradycardia noted with caution re salt substitutes due to K.  On 12/15/2019, she presented to the emergency department after [facility] reportedly took labs and found potassium significantly elevated at 7.8, and once labs were rechecked per our request on 9/29. Recheck showed K 4.6.  It was noted that it was unclear whether or not the value via home health was a true value or 2/2 pseudohyperkalemia but had self resolved. Seen 12/21/2019 with her daughter-in-law.  She noted occasional dizziness when she lays flat in the bed versus occurring without any clear triggers at her previous visit. She is using 2 pillows for dizziness. Sx improved on sliding scale  lasix based on wt. Recommendation was to discontinue ASA to reduce risk of bleeding on Eliquis and given her anemia on CBC. She was not regularly following with vascular surgery and carotid US ordered for bruit. [place] unfortunately not performed due to pt no show status.  Today, 03/25/2020, she returns to clinic and notes that she has been doing well from a cardiac standpoint.  On further discussion of her carotid ultrasound visit, it is discovered that she was unaware she had an appointment.  She denies any further dizziness or amaurosis fugax today with decision to thus defer carotid studies as below.  She reports significant improvement since last seen in the office.  No chest pain with exertion or at rest.  She denies any shortness of breath at rest.  She will occasionally become short of breath if overexerting herself or speaking very quickly but otherwise reports her breathing has been very much improved.  She continues to sleep on 2 pillows but notes this is only for comfort.  No orthopnea or PND.  No weight gain or early satiety.  She denies any lower extremity edema on Lasix.  She reports melena over the last few weeks.  No other reported signs or symptoms of bleeding.  She reports medication compliance, including Eliquis.  She does admit that she did not eat well over the holidays, reportedly eating mac & cheese, dressing, ham, and other items but has  since again started to eat a heart healthy and low-salt diet.  She reports BP is well controlled at home with SBP 140s before her medications and below 130s after medications.  DBP 60s.  She continues to take medications based on BP before her medications and as previously recommended at her last office visit (and outlined in her AVS). She does not have a regular workout routine but does report she is very active around the house. She is very happy with her current cardiac health and improvement in symptoms.  She is very excited for 3 birthday parties that  are upcoming in the near future due to her sister's birthdays.  Home Medications    Prior to Admission medications   Medication Sig Start Date End Date Taking? Authorizing Provider  albuterol (PROVENTIL HFA) 108 (90 Base) MCG/ACT inhaler Inhale 2 puffs into the lungs every 4 (four) hours as needed for wheezing or shortness of breath. 11/17/19   Carrie Mew, MD  albuterol (PROVENTIL) (2.5 MG/3ML) 0.083% nebulizer solution Take 2.5 mg by nebulization every 6 (six) hours as needed for wheezing or shortness of breath.    [provider]  alendronate (FOSAMAX) 70 MG tablet Take 70 mg by mouth once a week. Take with a full glass of water on an empty stomach.    [provider]  amLODipine (NORVASC) 10 MG tablet Take 10 mg by mouth daily.    [provider]  apixaban (ELIQUIS) 5 MG TABS tablet Take 1 tablet (5 mg total) by mouth 2 (two) times daily. 09/29/19   Ezekiel Slocumb, DO  aspirin EC 81 MG EC tablet Take 1 tablet (81 mg total) by mouth daily. 03/27/16   Hillary Bow, MD  buPROPion (WELLBUTRIN SR) 150 MG 12 hr tablet Take 150 mg by mouth 2 (two) times daily. 11/06/19   [provider]  feeding supplement, ENSURE ENLIVE, (ENSURE ENLIVE) LIQD Take 237 mLs by mouth daily. 09/27/19   Nicole [facility] A, DO  ferrous sulfate 325 (65 FE) MG EC tablet Take 1 tablet (325 mg total) by mouth 2 (two) times daily. 11/11/19 05/09/20  Mercy Riding, MD  fluticasone-salmeterol (ADVAIR HFA) 230-21 MCG/ACT inhaler Inhale 2 puffs into the lungs 2 (two) times daily.    [provider]  furosemide (LASIX) 20 MG tablet Take 1 tablet (20 mg total) by mouth daily. 11/13/19 05/11/20  Mercy Riding, MD  gabapentin (NEURONTIN) 300 MG capsule Take 300 mg by mouth 2 (two) times daily.     [provider]  hydrALAZINE (APRESOLINE) 25 MG tablet Take 25 mg by mouth in the morning and at bedtime.    [provider]  Ipratropium-Albuterol (COMBIVENT) 20-100 MCG/ACT AERS  respimat Inhale 1 puff into the lungs 4 (four) times daily as needed for wheezing or shortness of breath. 11/11/19   Mercy Riding, MD  lidocaine (LIDODERM) 5 % Place 1 patch onto the skin daily. Remove & Discard patch within 12 hours or as directed by MD 09/28/19   Nicole [facility] A, DO  lisinopril (ZESTRIL) 20 MG tablet Take 2 tablets (40 mg total) by mouth daily. 11/13/19   Mercy Riding, MD  metoprolol tartrate (LOPRESSOR) 25 MG tablet Take 0.5 tablets (12.5 mg total) by mouth 2 (two) times daily. 11/11/19   Mercy Riding, MD  nitroGLYCERIN (NITROSTAT) 0.4 MG SL tablet Place 1 tablet (0.4 mg total) under the tongue every 5 (five) minutes as needed for chest pain. 09/27/19   Nicole [facility]  A, DO  omeprazole (PRILOSEC) 20 MG capsule Take 20 mg by mouth daily.    [provider]  pravastatin (PRAVACHOL) 20 MG tablet Take 20 mg by mouth at bedtime.     [provider]  senna-docusate (SENOKOT-S) 8.6-50 MG tablet Take 1 tablet by mouth 2 (two) times daily between meals as needed for mild constipation. 11/11/19   Mercy Riding, MD  tamsulosin (FLOMAX) 0.4 MG CAPS capsule Take 0.4 mg by mouth daily.    [provider]  traZODone (DESYREL) 100 MG tablet Take 50-100 mg by mouth at bedtime. 11/06/19   [provider]  umeclidinium bromide (INCRUSE ELLIPTA) 62.5 MCG/INH AEPB Inhale 1 puff into the lungs daily. 11/11/19   Mercy Riding, MD  vitamin B-12 (CYANOCOBALAMIN) 1000 MCG tablet Take 1,000 mcg by mouth daily.    [provider]    Review of Systems    She reports resolved palpitations and dizziness. She has improved energy.  She denies chest pain, dyspnea, pnd, orthopnea, n, v, syncope, edema, weight gain, or early satiety.  She does report recent melena but otherwise no signs or symptoms of GI bleed.    All other systems reviewed and are otherwise negative except as noted above.  Physical Exam    VS:  BP 140/82 (BP Location: Left Arm, Patient Position:  Sitting, Cuff Size: Normal)    Pulse 60    Ht _0  (1.575 m)    Wt 168 lb 4 oz (76.3 kg)    SpO2 98%    BMI 30.77 kg/m  , BMI Body mass index is 30.77 kg/m. GEN: Well nourished, well developed, in no acute distress. HEENT: normal. Neck: Supple. No JVD.  Bilateral carotid bruit versus radiation from subclavian / LUE thrombi.  No masses.   Cardiac: RRR.  2/6 systolic murmur.  No rubs, or gallops. No clubbing, cyanosis.  Trace bilateral edema.  Radials/DP/PT 2+ and equal bilaterally.  Respiratory:  Respirations regular and unlabored, CTAB. GI: Soft, nontender, nondistended, BS + x 4.   MS: no deformity or atrophy.  Skin: warm and dry, no rash. Neuro:  Strength and sensation are intact. Psych: Normal affect.  Accessory Clinical Findings    ECG personally reviewed by me today -No EKG.  VITALS Reviewed today   Temp Readings from Last 3 Encounters:  02/06/20 97.9 F (36.6 C) (Tympanic)  01/09/20 (!) 97.3 F (36.3 C) (Tympanic)  12/15/19 98 F (36.7 C) (Oral)   BP Readings from Last 3 Encounters:  03/25/20 140/82  03/04/20 (!) 183/51  02/06/20 136/62   Pulse Readings from Last 3 Encounters:  03/25/20 60  03/04/20 68  02/06/20 68    Wt Readings from Last 3 Encounters:  03/25/20 168 lb 4 oz (76.3 kg)  03/04/20 164 lb 4 oz (74.5 kg)  02/06/20 160 lb 3 oz (72.7 kg)     LABS  reviewed today   Lab Results  Component Value Date   WBC 6.2 01/09/2020   HGB 12.9 01/09/2020   HCT 37.6 01/09/2020   MCV 91.0 01/09/2020   PLT 530 (H) 01/09/2020   Lab Results  Component Value Date   CREATININE 1.19 (H) 03/04/2020   BUN 37 (H) 03/04/2020   NA 139 03/04/2020   K 4.5 03/04/2020   CL 105 03/04/2020   CO2 25 03/04/2020   Lab Results  Component Value Date   ALT 13 12/07/2019   AST 13 12/07/2019   ALKPHOS 91 12/07/2019   BILITOT 0.5 12/07/2019  Lab Results  Component Value Date   CHOL 139 03/25/2016   HDL 46 03/25/2016   LDLCALC 76 03/25/2016   LDLDIRECT 67 12/07/2019    TRIG 114 09/18/2019   CHOLHDL 3.0 03/25/2016    Lab Results  Component Value Date   HGBA1C 5.4 09/16/2019   No results found for: TSH    01/2020 Care everywhere labs Hemoglobin 12.7, hematocrit 38.7, platelets 441, glucose 78, sodium 140, potassium 4.6, CO2 33.6, creatinine 1.2, BUN 29, AST 15, ALT 18, albumin 4.4 Total cholesterol 159, triglycerides 120, LDL Calc 71 TSH 1.764  STUDIES/PROCEDURES reviewed today   Limited echo 11/10/2019 1. Left ventricular ejection fraction, by estimation, is 60 to 65%. The  left ventricle has normal function. The left ventricle has no regional  wall motion abnormalities. There is mild left ventricular hypertrophy.  Left ventricular diastolic parameters  are indeterminate.  2. Right ventricular systolic function is normal. The right ventricular  size is normal.  3. The mitral valve is normal in structure. No evidence of mitral valve  regurgitation. No evidence of mitral stenosis.   Echo  09/2019 1. Left ventricular ejection fraction, by estimation, is 60 to 65%. The  left ventricle has normal function. The left ventricle has no regional  wall motion abnormalities. Left ventricular diastolic parameters were  normal.  2. Right ventricular systolic function is normal. The right ventricular  size is normal.  3. The mitral valve is grossly normal. No evidence of mitral valve  regurgitation.  4. The aortic valve is grossly normal. Aortic valve regurgitation is not  visualized. Echo 07/2019  Echo 07/2019   [facility]      Pamela James, Pamela James                            [id number]  DOB: 10-01-1945         CARDIAC DIAGNOSTIC UNIT        Date: 08/08/2019 14:30:00                            Adult   Female Age: 89          ECHO-DOPPLER REPORT         Outpatient                            MPDC   ---------------------------------------------------- MD1: Romeo Apple   STUDY: Chest Wall     TAPE: 0000:00:0:00:00 BP: 100/91    ECHO: Yes  DOPPLER: Yes FILE: [phone]:  HR: 52   COLOR: Yes CONTRAST: No   MACHINE: [id number] #3 Height: 62 in  RV BIOPSY: No     3D: No  SOUND QLTY: Moderate Weight: 139 lbs   MEDIUM: None                   BSA: 1.66, BMI: 25.40  ------------------------------------------------------------------------------   HISTORY: pre- Surgery   REASON: Assess LV function Assess RV function  INDICATION: I10 - Essential (primary) hypertension. E67.544 - Encounter for       other preprocedural examination. R01.1 - Cardiac murmur,       unspecified.      ECHOCARDIOGRAPHIC MEASUREMENTS -----------------------------------------------  2D DIMENSIONS  AORTA     Values   Normal RangeMAIN PA   Values   Normal Range    Annulus: nm* cm  [1.9 - 2.7]  PA Main: nm* cm  [1.5 - 2.1]   Aorta Sin: nm* cm  [2.4 - 3.6] RIGHT VENTRICLE  ST Junction: nm* cm  [2 - 3.2]   RV Base:  3.7 cm  [2.5 - 4.1]   Asc.Aorta: nm* cm  [1.9 - 3.5]   RV Mid:  2.5 cm  [1.9 - 3.5]  LEFT VENTRICLE             RV Length: nm* cm  _0      LVIDd:  4.3 cm  [3.7 - 5.3] RIGHT ATRIUM     LVIDs:  2.4 cm  [2.2 - 3.4]  RA Area: 12  cm2  [ <= 20]    LVEDVi: 76.0 ml/m2 [29 - 61]     RAVi: 14  ml/m2 [15 - 27]    LVESVi: 21.0 ml/m2 [8 - 24]  INFERIOR VENA CAVA      FS: 44  %   [ >= 25]    Max.IVC:  1.6 cm  [ <= 2.1]      SWT:  1.1 cm  [0.6 - 0.9]  Min.IVC: nm* cm  [ <= 1.7]      PWT:  1.1 cm  [0.6 - 0.9] __________________  LEFT ATRIUM              nm* - not measured    LA Diam:  3.7 cm  [2.7 - 3.8]    LA Area: 18  cm2  [ <= 20]   LA Volume: 52  ml  [22 - 52]      LAVi: 31  ml/m2 [16 - 34]    ECHOCARDIOGRAPHIC DESCRIPTIONS -----------------------------------------------  AORTIC ROOT     Size: Not seen  Dissection: INDETERM FOR DISSECTION    AORTIC VALVE   Leaflets: Tricuspid       Morphology: MILDLY THICKENED   Mobility: Fully Mobile    LEFT VENTRICLE                   Anterior: Normal     Size: Normal                 Lateral: Normal  Contraction: Normal                 Septal: Normal  Closest EF: >55%(Estimated) Calc.EF: 72% (3D)   Apical: Normal   LV masses: No Masses               Inferior: Normal      LVH: MILD LVH CONCENTRIC         Posterior: Normal  LV GLS(AVG): -17.6% Normal Range [ <= -16]  Dias.FxClass: Normal    MITRAL VALVE   Leaflets: Normal         Mobility: Fully mobile  Morphology: Normal    LEFT ATRIUM     Size: Normal   LA masses: No masses        Normal IAS    MAIN PA     Size: Normal    PULMONIC VALVE  Morphology: Normal   Mobility: Fully Mobile    RIGHT VENTRICLE     Size: Normal          Free wall: Normal  Contraction: Normal          RV masses: No Masses     TAPSE:  1.9 cm, Normal Range [>= 1.6 cm]    TRICUSPID VALVE   Leaflets: Normal         Mobility: Fully  mobile  Morphology: Normal    RIGHT ATRIUM     Size: Normal           RA Other: None   RA masses: No masses    PERICARDIUM     Fluid: TRIVIAL FLUID    INFERIOR VENACAVA     Size: Normal   Normal respiratory collapse    DOPPLER ECHO and OTHER SPECIAL PROCEDURES ------------------------------------   Aortic: No AR         No AS     Mitral: TRIVIAL MR       No MS   MV Inflow E Vel.= 74.0 cm/s MV Annulus E'Vel.= 8.5 cm/s E/E'Ratio= 9    Tricuspid: TRIVIAL TR        No TS    Pulmonary: MILD PR        No PS     Other:    INTERPRETATION ---------------------------------------------------------------  NORMAL LEFT VENTRICULAR SYSTOLIC FUNCTION WITH MILD LVH  NORMAL LA PRESSURES WITH NORMAL DIASTOLIC FUNCTION  NORMAL RIGHT VENTRICULAR SYSTOLIC FUNCTION  VALVULAR REGURGITATION: TRIVIAL MR, MILD PR, TRIVIAL TR  NO VALVULAR STENOSIS  TRIVIAL PERICARDIAL EFFUSION    Compared with prior Echo study on 02/22/2009: ([place]): NO  SIGNIFICANT CHANGES      (Report version 3.0)          Interpreted and Electronically signed  Perform. by: Livingston Diones, RDCS        by: Ignatius Specking, MD  Resp.Person: Livingston Diones, RDCS        On: 08/08/2019 17:25:43    Assessment & Plan    Chronic diastolic congestive heart failure --No significant SOB. DOE only with overexertion. Echo with nl EF, NRWMA, no severe/significant valvular abnormalities, and R heart pressures as above.  She continues on a sliding scale Lasix regimen, which has been working well for her after the guidelines at her last clinic visit.  Reviewed recommendations for fluid intake under 2L and salt under 2g. Euvolemic and well compensated on exam.   Continue current PRN lasix (based on weight, sliding scale). Closely monitor weights and volume status. No ACE/ARB/ARNI in the setting of history of frequent hyperkalemia. Will obtain BMET today to verify that potassium and renal function still wnl given her history and per pt request.  Previous labs as above.  Hypertension, goal BP 130/80 or lower, improved --BP improved significantly from previous clinic visit at 140/82 today.  She reports significant success with the most recent guidelines for her BP medications, outlined at her last clinic visit.  She continues on sliding scale Lasix.  In addition, and as indicated in her last office visit note/AVS, she no longer holds  her hydralazine 2/2 risk of reflex tachycardia.  She has hold parameters for her amlodipine, administered as amlodipine 5 mg twice daily (total daily dose =amlodipine 10 mg qd).  She takes her blood pressure before her a.m. dose and p.m. dose each day.  If SBP above 110 mmHg, she takes amlodipine 5 mg.  She will hold her amlodipine if SBP at or below 110 mmHg.  This is working well for her. She is keeping fluid under 2 L and salt under 2 g (with the exception of the holidays).  She will continue to log home BP.  She will call the office if any concerns.    History of Bradycardia, junctional bradycardia History of electrolyte abnormalities --Resolved. Previous h/o junction bradycardia in the setting of electrolyte abnormalities.  With improvement in electrolytes, EKG showed resolution of  junctional bradycardia and patient reported improved energy, DOE, and dizziness.  She is now on sliding scale Lasix with resolution of earlier hyperkalemia.  Given her history of electrolyte abnormalities, and per patient request, will recheck BMET today to confirm electrolytes wnl.   History of left subclavian clot/left upper extremity clot --Placed on Eliquis for left subclavian clot. She does not meet criteria for reduced dosing at this time.  Due to ongoing dizziness and left arm/shoulder pain at her last clinic visit, an ultrasound of her left subclavian was ordered to reassess her subclavian disease.  Unfortunately, she was not aware of this visit and thus this ultrasound has not yet been obtained.  However, given her resolution of previous dizziness and left arm shoulder/arm pain, we will defer repeat ultrasound at this time.  She will call the office if any further left arm/shoulder pain or dizziness.  L subclavian / carotid bruit --As above.  Unclear if bruit appreciated on exam is 2/2 radiation from valvular disease, UE thrombosis as noted above, or L carotid dz. Given resolution of previously reported symptoms of  dizziness and LUE pain as above, however, will defer repeat imaging of her carotids and subclavians at this time.  Continue Eliquis, HR/BP control, and cholesterol control with statin.   Anxiety --Bupropion changed to citalopram and trazodone added per PCP as she had not been sleeping well.  She reports sleeping well recently.  Prior tobacco use COPD --Reports ongoing smoking cessation.  Ongoing cessation encouraged.  HLD --Continue statin.  Anemia of chronic disease Iron deficiency anemia History of thrombocytosis Reported melena -Continue to follow with nephrology, hematology. Recommend frequent CBC per PCP/home health.  Known chronic anemia/iron deficiency anemia and history of thrombocytosis.  She reports melena today with recommendation for CBC.  Previous hemoglobin and hematocrit per care everywhere labs stable and as above.  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  Consider as contributing to her history of thrombosis as above.  Continue to follow with hematology and nephrology.  Left severe hydroureteronephrosis, recurrent Left ureteral tumor History of ureteral stenting --Continue to follow with nephrologist and [facility].   Medication changes: None. Labs ordered: BMET (history of hyperkalemia), CBC (melena). Studies / Imaging ordered: None.   Future considerations: Ultrasound of carotids/subclavian's if return of dizziness or left arm/shoulder pain.  Deferred for now, given resolution of the symptoms.   Disposition: RTC 6 months, sooner if needed .   Arvil Chaco, PA-C 03/25/2020

## 2020-03-25 ENCOUNTER — Ambulatory Visit (INDEPENDENT_AMBULATORY_CARE_PROVIDER_SITE_OTHER): Payer: Medicare HMO | Admitting: Physician Assistant

## 2020-03-25 ENCOUNTER — Other Ambulatory Visit: Payer: Self-pay

## 2020-03-25 ENCOUNTER — Encounter: Payer: Self-pay | Admitting: Physician Assistant

## 2020-03-25 VITALS — BP 140/82 | HR 60 | Ht 62.0 in | Wt 168.2 lb

## 2020-03-25 DIAGNOSIS — I10 Essential (primary) hypertension: Secondary | ICD-10-CM

## 2020-03-25 DIAGNOSIS — Z8639 Personal history of other endocrine, nutritional and metabolic disease: Secondary | ICD-10-CM

## 2020-03-25 DIAGNOSIS — I5032 Chronic diastolic (congestive) heart failure: Secondary | ICD-10-CM | POA: Diagnosis not present

## 2020-03-25 DIAGNOSIS — R0989 Other specified symptoms and signs involving the circulatory and respiratory systems: Secondary | ICD-10-CM | POA: Diagnosis not present

## 2020-03-25 DIAGNOSIS — J449 Chronic obstructive pulmonary disease, unspecified: Secondary | ICD-10-CM

## 2020-03-25 DIAGNOSIS — Z87898 Personal history of other specified conditions: Secondary | ICD-10-CM

## 2020-03-25 DIAGNOSIS — K921 Melena: Secondary | ICD-10-CM

## 2020-03-25 DIAGNOSIS — D649 Anemia, unspecified: Secondary | ICD-10-CM

## 2020-03-25 DIAGNOSIS — G458 Other transient cerebral ischemic attacks and related syndromes: Secondary | ICD-10-CM

## 2020-03-25 DIAGNOSIS — Z7901 Long term (current) use of anticoagulants: Secondary | ICD-10-CM | POA: Diagnosis not present

## 2020-03-25 DIAGNOSIS — D472 Monoclonal gammopathy: Secondary | ICD-10-CM

## 2020-03-25 DIAGNOSIS — I771 Stricture of artery: Secondary | ICD-10-CM

## 2020-03-25 DIAGNOSIS — E785 Hyperlipidemia, unspecified: Secondary | ICD-10-CM

## 2020-03-25 DIAGNOSIS — Z87891 Personal history of nicotine dependence: Secondary | ICD-10-CM | POA: Diagnosis not present

## 2020-03-25 DIAGNOSIS — I82722 Chronic embolism and thrombosis of deep veins of left upper extremity: Secondary | ICD-10-CM

## 2020-03-25 NOTE — Patient Instructions (Signed)
Medication Instructions:   Your physician recommends that you continue on your current medications as directed. Please refer to the Current Medication list given to you today.  *If you need a refill on your cardiac medications before your next appointment, please call your pharmacy*   Lab Work: Your physician recommends that you have lab work TODAY: Bmet, CBC  If you have labs (blood work) drawn today and your tests are completely normal, you will receive your results only by: Marland Kitchen MyChart Message (if you have MyChart) OR . A paper copy in the mail If you have any lab test that is abnormal or we need to change your treatment, we will call you to review the results.   Testing/Procedures: None ordered  *We have cancelled your carotid ultrasound orders   Follow-Up: At [facility], you and your health needs are our priority.  As part of our continuing mission to provide you with exceptional heart care, we have created designated Provider Care Teams.  These Care Teams include your primary Cardiologist (physician) and Advanced Practice Providers (APPs -  Physician Assistants and Nurse Practitioners) who all work together to provide you with the care you need, when you need it.  We recommend signing up for the patient portal called "MyChart".  Sign up information is provided on this After Visit Summary.  MyChart is used to connect with patients for Virtual Visits (Telemedicine).  Patients are able to view lab/test results, encounter notes, upcoming appointments, etc.  Non-urgent messages can be sent to your provider as well.   To learn more about what you can do with MyChart, go to [email or web address].    Your next appointment:   6 month(s)  The format for your next appointment:   In Person  Provider:   You may see Ida Rogue, MD or one of the following Advanced Practice Providers on your designated Care Team:     Marrianne Mood, [place]

## 2020-03-26 LAB — CBC
Hematocrit: 37.7 % (ref 34.0–46.6)
Hemoglobin: 12.8 g/dL (ref 11.1–15.9)
MCH: 32.6 pg (ref 26.6–33.0)
MCHC: 34 g/dL (ref 31.5–35.7)
MCV: 96 fL (ref 79–97)
Platelets: 582 10*3/uL — ABNORMAL HIGH (ref 150–450)
RBC: 3.93 x10E6/uL (ref 3.77–5.28)
RDW: 15.1 % (ref 11.7–15.4)
WBC: 7.6 10*3/uL (ref 3.4–10.8)

## 2020-03-26 LAB — BASIC METABOLIC PANEL
BUN/Creatinine Ratio: 29 — ABNORMAL HIGH (ref 12–28)
BUN: 37 mg/dL — ABNORMAL HIGH (ref 8–27)
CO2: 16 mmol/L — ABNORMAL LOW (ref 20–29)
Calcium: 10 mg/dL (ref 8.7–10.3)
Chloride: 104 mmol/L (ref 96–106)
Creatinine, Ser: 1.27 mg/dL — ABNORMAL HIGH (ref 0.57–1.00)
GFR calc Af Amer: 48 mL/min/{1.73_m2} — ABNORMAL LOW (ref >59)
GFR calc non Af Amer: 41 mL/min/{1.73_m2} — ABNORMAL LOW (ref >59)
Glucose: 84 mg/dL (ref 65–99)
Potassium: 6 mmol/L — ABNORMAL HIGH (ref 3.5–5.2)
Sodium: 139 mmol/L (ref 134–144)

## 2020-03-27 ENCOUNTER — Other Ambulatory Visit
Admission: RE | Admit: 2020-03-27 | Discharge: 2020-03-27 | Disposition: A | Discharge status: Home / Self Care | Payer: Medicare HMO | Attending: Internal Medicine | Admitting: Internal Medicine

## 2020-03-27 ENCOUNTER — Telehealth: Payer: Self-pay | Admitting: *Deleted

## 2020-03-27 DIAGNOSIS — E875 Hyperkalemia: Secondary | ICD-10-CM

## 2020-03-27 LAB — BASIC METABOLIC PANEL
Anion gap: 11 (ref 5–15)
BUN: 34 mg/dL — ABNORMAL HIGH (ref 8–23)
CO2: 26 mmol/L (ref 22–32)
Calcium: 10.2 mg/dL (ref 8.9–10.3)
Chloride: 102 mmol/L (ref 98–111)
Creatinine, Ser: 1.11 mg/dL — ABNORMAL HIGH (ref 0.44–1.00)
GFR, Estimated: 52 mL/min — ABNORMAL LOW (ref >60)
Glucose, Bld: 105 mg/dL — ABNORMAL HIGH (ref 70–99)
Potassium: 4.3 mmol/L (ref 3.5–5.1)
Sodium: 139 mmol/L (ref 135–145)

## 2020-03-27 NOTE — Telephone Encounter (Signed)
Patient son Venora Maples calling to discuss lab results .

## 2020-03-27 NOTE — Telephone Encounter (Signed)
Preliminary lab reviewed this morning, noted pt's potassium is critically high at 6.0. Dr. Saunders Revel (DOD) made aware. Per Dr. Saunders Revel, if pt is unstable have her go to ER, if stable have repeat Bmet STAT at [place], and verify that pt is not taking Lisinopril.   I spoke to pt's son, Andre(DPR), he confirmed pt is not taking lisinopril, pt is not taking any potassium supplement. Asked son if pt takes any liquid nutritional supplements, he did report that pt does drink coconut water. He said he was aware coc water does have potassium so he had pt decr from drinking daily to only "a few cups during week". I notified Venora Maples that pt does not need to drink any coconut water as this is likely the cause of her consistent h/o hyperkalemia as it is is very high in potassium. Explained to Venora Maples the serious cardiac concerns with hyperkalemia and advised he get rid of pt's coconut water and make sure she does not continue to drink. Venora Maples verbalized understanding. Venora Maples en route to take pt to medical mall for repeat Bmet. Will follow up with results later today.

## 2020-03-27 NOTE — Telephone Encounter (Signed)
Spoke to pt son, Venora Maples [facility], [place]), notified of results: potassium now normal and kidney function also stable.  Venora Maples verbalized understanding. Pt will continue current medications and will cut out coconut water which contained over 400mg  potassium in each serving per son. Son also monitoring pt's diet intake to ensure refraining from high potassium foods. Noted also that pt should avoid any medications that elevate potassium including ARBs, ACE inhibitors, aldosterone antagonists.

## 2020-03-27 NOTE — Telephone Encounter (Signed)
Pamela Bush, MD  03/27/2020 12:53 PM EST Back to Top     Please let Pamela James know that her potassium is normal. Renal function is also stable. She should continue her current medications and follow-up as discussed at her office visit earlier this week. Potassium supplementation as well as medications that may elevate her potassium such as ACE inhibitor's, ARB's, and aldosterone antagonist should continue to be avoided.

## 2020-04-10 DIAGNOSIS — I10 Essential (primary) hypertension: Secondary | ICD-10-CM | POA: Diagnosis not present

## 2020-06-13 ENCOUNTER — Other Ambulatory Visit (HOSPITAL_COMMUNITY): Payer: Self-pay | Admitting: Internal Medicine

## 2020-06-13 ENCOUNTER — Other Ambulatory Visit: Payer: Self-pay | Admitting: Internal Medicine

## 2020-06-13 DIAGNOSIS — J449 Chronic obstructive pulmonary disease, unspecified: Secondary | ICD-10-CM | POA: Diagnosis not present

## 2020-06-13 DIAGNOSIS — R413 Other amnesia: Secondary | ICD-10-CM

## 2020-06-13 DIAGNOSIS — J4530 Mild persistent asthma, uncomplicated: Secondary | ICD-10-CM | POA: Diagnosis not present

## 2020-06-13 DIAGNOSIS — F419 Anxiety disorder, unspecified: Secondary | ICD-10-CM | POA: Diagnosis not present

## 2020-06-13 DIAGNOSIS — I110 Hypertensive heart disease with heart failure: Secondary | ICD-10-CM | POA: Diagnosis not present

## 2020-06-13 DIAGNOSIS — E7800 Pure hypercholesterolemia, unspecified: Secondary | ICD-10-CM | POA: Diagnosis not present

## 2020-06-13 DIAGNOSIS — R519 Headache, unspecified: Secondary | ICD-10-CM

## 2020-06-13 DIAGNOSIS — I82B12 Acute embolism and thrombosis of left subclavian vein: Secondary | ICD-10-CM | POA: Diagnosis not present

## 2020-06-13 DIAGNOSIS — F339 Major depressive disorder, recurrent, unspecified: Secondary | ICD-10-CM | POA: Diagnosis not present

## 2020-06-13 DIAGNOSIS — I5030 Unspecified diastolic (congestive) heart failure: Secondary | ICD-10-CM | POA: Diagnosis not present

## 2020-06-20 ENCOUNTER — Other Ambulatory Visit: Payer: Self-pay

## 2020-06-20 ENCOUNTER — Ambulatory Visit
Admission: RE | Admit: 2020-06-20 | Discharge: 2020-06-20 | Disposition: A | Discharge status: Home / Self Care | Payer: Medicare HMO | Source: Ambulatory Visit | Attending: Internal Medicine | Admitting: Internal Medicine

## 2020-06-20 DIAGNOSIS — R519 Headache, unspecified: Secondary | ICD-10-CM | POA: Diagnosis not present

## 2020-06-20 DIAGNOSIS — G9389 Other specified disorders of brain: Secondary | ICD-10-CM | POA: Diagnosis not present

## 2020-06-20 DIAGNOSIS — R413 Other amnesia: Secondary | ICD-10-CM | POA: Insufficient documentation

## 2020-06-20 DIAGNOSIS — J322 Chronic ethmoidal sinusitis: Secondary | ICD-10-CM | POA: Diagnosis not present

## 2020-06-20 DIAGNOSIS — I6782 Cerebral ischemia: Secondary | ICD-10-CM | POA: Diagnosis not present

## 2020-07-08 DIAGNOSIS — F411 Generalized anxiety disorder: Secondary | ICD-10-CM | POA: Diagnosis not present

## 2020-07-08 DIAGNOSIS — J4530 Mild persistent asthma, uncomplicated: Secondary | ICD-10-CM | POA: Diagnosis not present

## 2020-07-08 DIAGNOSIS — Z96642 Presence of left artificial hip joint: Secondary | ICD-10-CM | POA: Diagnosis not present

## 2020-07-08 DIAGNOSIS — Z87891 Personal history of nicotine dependence: Secondary | ICD-10-CM | POA: Diagnosis not present

## 2020-07-08 DIAGNOSIS — N289 Disorder of kidney and ureter, unspecified: Secondary | ICD-10-CM | POA: Diagnosis not present

## 2020-07-08 DIAGNOSIS — I10 Essential (primary) hypertension: Secondary | ICD-10-CM | POA: Diagnosis not present

## 2020-07-08 DIAGNOSIS — I358 Other nonrheumatic aortic valve disorders: Secondary | ICD-10-CM | POA: Diagnosis not present

## 2020-08-19 ENCOUNTER — Other Ambulatory Visit: Payer: Self-pay

## 2020-08-19 MED ORDER — AMLODIPINE BESYLATE 10 MG PO TABS: 5.0000 mg | ORAL_TABLET | Freq: Two times a day (BID) | ORAL | 0 refills | Status: DC | PRN

## 2020-08-22 DIAGNOSIS — M545 Low back pain, unspecified: Secondary | ICD-10-CM | POA: Diagnosis not present

## 2020-08-22 DIAGNOSIS — R2681 Unsteadiness on feet: Secondary | ICD-10-CM | POA: Diagnosis not present

## 2020-12-09 DIAGNOSIS — Z1159 Encounter for screening for other viral diseases: Secondary | ICD-10-CM | POA: Diagnosis not present

## 2020-12-09 DIAGNOSIS — F419 Anxiety disorder, unspecified: Secondary | ICD-10-CM | POA: Diagnosis not present

## 2020-12-09 DIAGNOSIS — J4530 Mild persistent asthma, uncomplicated: Secondary | ICD-10-CM | POA: Diagnosis not present

## 2020-12-09 DIAGNOSIS — R52 Pain, unspecified: Secondary | ICD-10-CM | POA: Diagnosis not present

## 2020-12-09 DIAGNOSIS — E559 Vitamin D deficiency, unspecified: Secondary | ICD-10-CM | POA: Diagnosis not present

## 2020-12-09 DIAGNOSIS — I10 Essential (primary) hypertension: Secondary | ICD-10-CM | POA: Diagnosis not present

## 2020-12-09 DIAGNOSIS — E7800 Pure hypercholesterolemia, unspecified: Secondary | ICD-10-CM | POA: Diagnosis not present

## 2020-12-09 DIAGNOSIS — F3341 Major depressive disorder, recurrent, in partial remission: Secondary | ICD-10-CM | POA: Diagnosis not present

## 2020-12-09 DIAGNOSIS — R8290 Unspecified abnormal findings in urine: Secondary | ICD-10-CM | POA: Diagnosis not present

## 2020-12-09 DIAGNOSIS — Z87891 Personal history of nicotine dependence: Secondary | ICD-10-CM | POA: Diagnosis not present

## 2020-12-16 DIAGNOSIS — Z0000 Encounter for general adult medical examination without abnormal findings: Secondary | ICD-10-CM | POA: Diagnosis not present

## 2020-12-16 DIAGNOSIS — R413 Other amnesia: Secondary | ICD-10-CM | POA: Diagnosis not present

## 2020-12-16 DIAGNOSIS — Z1389 Encounter for screening for other disorder: Secondary | ICD-10-CM | POA: Diagnosis not present

## 2020-12-16 DIAGNOSIS — I130 Hypertensive heart and chronic kidney disease with heart failure and stage 1 through stage 4 chronic kidney disease, or unspecified chronic kidney disease: Secondary | ICD-10-CM | POA: Diagnosis not present

## 2020-12-16 DIAGNOSIS — Z1231 Encounter for screening mammogram for malignant neoplasm of breast: Secondary | ICD-10-CM | POA: Diagnosis not present

## 2020-12-16 DIAGNOSIS — F339 Major depressive disorder, recurrent, unspecified: Secondary | ICD-10-CM | POA: Diagnosis not present

## 2020-12-16 DIAGNOSIS — I82B12 Acute embolism and thrombosis of left subclavian vein: Secondary | ICD-10-CM | POA: Diagnosis not present

## 2020-12-16 DIAGNOSIS — Z1211 Encounter for screening for malignant neoplasm of colon: Secondary | ICD-10-CM | POA: Diagnosis not present

## 2020-12-16 DIAGNOSIS — Z96642 Presence of left artificial hip joint: Secondary | ICD-10-CM | POA: Diagnosis not present

## 2020-12-16 DIAGNOSIS — Z23 Encounter for immunization: Secondary | ICD-10-CM | POA: Diagnosis not present

## 2020-12-16 DIAGNOSIS — N189 Chronic kidney disease, unspecified: Secondary | ICD-10-CM | POA: Diagnosis not present

## 2020-12-16 DIAGNOSIS — F3340 Major depressive disorder, recurrent, in remission, unspecified: Secondary | ICD-10-CM | POA: Insufficient documentation

## 2020-12-17 ENCOUNTER — Other Ambulatory Visit: Payer: Self-pay | Admitting: Internal Medicine

## 2020-12-17 ENCOUNTER — Other Ambulatory Visit: Payer: Self-pay

## 2020-12-17 ENCOUNTER — Ambulatory Visit (INDEPENDENT_AMBULATORY_CARE_PROVIDER_SITE_OTHER): Payer: Medicare HMO

## 2020-12-17 DIAGNOSIS — I5032 Chronic diastolic (congestive) heart failure: Secondary | ICD-10-CM | POA: Diagnosis not present

## 2020-12-17 DIAGNOSIS — Z1231 Encounter for screening mammogram for malignant neoplasm of breast: Secondary | ICD-10-CM

## 2020-12-17 LAB — ECHOCARDIOGRAM COMPLETE
AR max vel: 3.32 cm2
AV Area VTI: 2.98 cm2
AV Area mean vel: 3.07 cm2
AV Mean grad: 4 mmHg
AV Peak grad: 7.1 mmHg
Ao pk vel: 1.33 m/s
Area-P 1/2: 4.26 cm2
Calc EF: 49.6 %
S' Lateral: 2.6 cm
Single Plane A2C EF: 39.2 %
Single Plane A4C EF: 58.2 %

## 2020-12-20 ENCOUNTER — Other Ambulatory Visit: Payer: Medicare HMO

## 2020-12-20 NOTE — Progress Notes (Signed)
Cardiology Office Note:    Date:  12/20/2020   ID:  Pamela James, DOB 07/25/1945, MRN [id number]  PCP:  Tracie Harrier, MD  [facility] Cardiologist:  Ida Rogue, MD  [place] Electrophysiologist:  None   Referring MD: Tracie Harrier, MD   Chief Complaint: 6 month follow-up  History of Present Illness:    Pamela James is a 75 y.o. 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   She was admitted 09/15/2019-09/27/19 to [facility] after an episode of syncope with bradycardia that resulted in CPR. Admission complicated by aspiration pneumonia with Unasyn, pulmonary edema requiring IV Lasix, left subclavian clot, hypertensive urgency, ?alcohol withdrawal, metabolic encephalopathy, and bradycardia/hypotension. She was intubated and admitted to ICU .  She was hypotensive and bradycardic, requiring Precedex and dopamine drips.  She was started on Eliquis for left subclavian clot.  She was extubated and transferred out of the ICU; however, due to hypertensive urgency, she required transfer back to the ICU for nicardipine drip.  She received IV lasix for pulmonary vascular congestion with improvement. She was discharged home to live with her son 09/27/2019.   09/16/2019 ultrasound of the upper left extremity showed partially occluded thrombus of left subclavian vein.  She also has occlusive thrombus of the axillary vein that is noncompressible, occlusive thrombus of the cephalic vein, which is also noncompressible, occlusive thrombus of the brachial veins, occlusive thrombus of the radial veins, occlusive thrombus of the ulnar veins.  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   Occlusive of the axillary paired brachial veins, and nonocclusive at the subclavian vein.  Superficial occlusive thrombophlebitis of the cephalic vein, basilic vein, radial vein, and ulnar vein.   On 11/09/2019, she presented to the emergency department and found to be hypoxic, volume overloaded, and hyperkalemic with ARF and severe left hydronephrosis.  CT showed new moderately severe left hydronephrosis  with mid and distal left ureter showing possible hemorrhage by tumor within the ureter. Noted was small bilateral pleural effusions and improved bibasilar airspace disease and right renal scarring with 3 nonobstructing stones.  At presentation, intermittent bradycardia was noted with atropine administered.  Junctional rhythm at 46 bpm noted and thought due to to electrolyte abnormality. With improved electrolytes (hyponatremia, hyperkalemia), repeat EKG showed improved rate and resolution of junctional rhythm.     Limited echo was performed due to electrical alternans on telemetry and echo as below and showed EF 60 to 65%, mild LVH. PRN lasix was provided for wt gain.    She was seen by the heart failure clinic 11/23/2019.  At that time, she reported minimal DOE with palpitations.  Noted was to possibly change lisinopril to [facility] in the future.  She had recently stopped smoking.   On 12/15/2019, she presented to the emergency department after [facility] reportedly took labs and found potassium significantly elevated at 7.8, and once labs were rechecked per our request on 9/29. Recheck showed K 4.6.  It was noted that it was unclear whether or not the value via home health was a true value or 2/2 pseudohyperkalemia but had self resolved  Last seen 03/25/20 and was doing well from a cardiac perspective.   Today, the patient reports she has a lot of congestion and coughing.  She is taking breathing treatments and inhalers for this. Energy seems low because of this. She has seen her PCP and will follow-up  if symptoms don't improve. She denies chest pain. Some SOB from the coughing. She had her flu shot last week. No LLE, pnd, palpitations. She has chronic orthopnea. BP and heart rate good. No bleeding issues on Eliquis. She takes lasix daily .   Past Medical History:  Diagnosis Date   Anxiety    Asthma    CHF (congestive heart failure) (HCC)    COPD (chronic obstructive pulmonary disease) (HCC)    Depression    GERD (gastroesophageal reflux disease)    Hypertension     Past Surgical History:  Procedure Laterality Date   ABDOMINAL HYSTERECTOMY     TOTAL HIP ARTHROPLASTY Left    TUBAL LIGATION      Current Medications: No outpatient medications have been marked as taking for the 12/23/20 encounter (Appointment) with Kathlen Mody, Kella Splinter H, PA-C.     Allergies:   Morphine and related, Nsaids, and Aspirin   Social History   Socioeconomic History   Marital status: Single    Spouse name: Not on file   Number of children: Not on file   Years of education: Not on file   Highest education level: Not on file  Occupational History   Occupation: retired  Tobacco Use   Smoking status: Former    Packs/day: 0.75    Years: 40.00    Pack years: 30.00    Types: Cigarettes    Quit date: 11/05/2019    Years since quitting: 1.1   Smokeless tobacco: Never  Vaping Use   Vaping Use: Never used  Substance and Sexual Activity   Alcohol use: No   Drug use: No   Sexual activity: Not on file  Other Topics Concern   Not on file  Social History Narrative   Not on file   Social Determinants of Health   Financial Resource Strain: Not on file  Food Insecurity: Not on file  Transportation Needs: Not on file  Physical Activity: Not on file  Stress: Not on file  Social Connections: Not on file     Family History: The patient's family history includes Heart disease in her father and sister.  ROS:   Please see the history of present illness.     All other systems reviewed and are  negative.  EKGs/Labs/Other Studies Reviewed:    The following studies were reviewed today:  Echo 12/17/20  1. Left ventricular ejection fraction, by estimation, is 60 to 65%. The  left ventricle has normal function. The left ventricle has no regional  wall motion abnormalities. There is moderate left ventricular hypertrophy.  Left ventricular diastolic  parameters are consistent with Grade II diastolic dysfunction  (pseudonormalization). The average left ventricular global longitudinal  strain is -14.7 %.   2. Right ventricular systolic function is normal. The right ventricular  size is normal. Tricuspid regurgitation signal is inadequate for assessing  PA pressure.   3. The mitral valve is normal in structure. Mild to moderate mitral valve  regurgitation. No evidence of mitral stenosis.   4. The aortic valve was not well visualized. Aortic valve regurgitation  is mild. No aortic stenosis is present.   Echo limited 10/2019  1. Left ventricular ejection fraction, by estimation, is 60 to 65%. The  left ventricle has normal function. The left ventricle has no regional  wall motion abnormalities. There is mild left ventricular hypertrophy.  Left  ventricular diastolic parameters  are indeterminate.   2. Right ventricular systolic function is normal. The right ventricular  size is normal.   3. The mitral valve is normal in structure. No evidence of mitral valve  regurgitation. No evidence of mitral stenosis.   Myoview Lexiscan 2018 Narrative & Impression    The study is normal. This is a low risk study. The left ventricular ejection fraction is normal (55-65%). There was no ST segment deviation noted during stress.   No ischemia. Normal heart function    EKG:  EKG is  ordered today.  The ekg ordered today demonstrates NSR 64bpm, TWI aVL, no changes  Recent Labs: 03/25/2020: Hemoglobin 12.8; Platelets 582 03/27/2020: BUN 34; Creatinine, Ser 1.11; Potassium 4.3; Sodium 139   Recent Lipid Panel    Component Value Date/Time   CHOL 139 03/25/2016 0341   TRIG 114 09/18/2019 0913   HDL 46 03/25/2016 0341   CHOLHDL 3.0 03/25/2016 0341   VLDL 17 03/25/2016 0341   LDLCALC 76 03/25/2016 0341   LDLDIRECT 67 12/07/2019 1301      Physical Exam:    VS:  There were no vitals taken for this visit.    Wt Readings from Last 3 Encounters:  03/25/20 168 lb 4 oz (76.3 kg)  03/04/20 164 lb 4 oz (74.5 kg)  02/06/20 160 lb 3 oz (72.7 kg)     GEN:  Well nourished, well developed in no acute distress HEENT: Normal NECK: No JVD; No carotid bruits LYMPHATICS: No lymphadenopathy CARDIAC: RRR, no murmurs, rubs, gallops RESPIRATORY: wheezing, diminished breath sounds  ABDOMEN: Soft, non-tender, non-distended MUSCULOSKELETAL:  No edema; No deformity  SKIN: Warm and dry NEUROLOGIC:  Alert and oriented x 3 PSYCHIATRIC:  Normal affect   ASSESSMENT:    No diagnosis found. PLAN:    In order of problems listed above:  HFpEF Most recent echo 12/17/20 showed LVEF 60-65%, no WMA, mod LVH, G2DD. Patient is euvolemic on exam. She takes lasix 20mg  daily. She reports stable weights. She has chronic orthopnea. She has DOE, but this is from URI symptoms. Continue Lopressor 12.5mg  BID.   HTN BP today good. Continue amlodipine 5mg  BID, Lopressor 12.5mg  BID, hydralazine 25mg  BID  H/o bradycardia, junctional bradycardia EKG shows SR with heart rate of 64bpm.   H/o left subclavian clot/left upper extremity clot She is on antocoagulation with Eliquis. She denies bleeding issues. No dizziness.   Prior tobacco use COPD Reports URI symptoms today, following with PCP.  HLD LDL 61. Continue Pravastatin.   Disposition: Follow up in 3 month(s) with MD/APP    Signed, Hendrik Donath Ninfa Meeker, PA-C  12/20/2020 4:03 PM    [facility]

## 2020-12-23 ENCOUNTER — Ambulatory Visit (INDEPENDENT_AMBULATORY_CARE_PROVIDER_SITE_OTHER): Payer: Medicare HMO | Admitting: Medical

## 2020-12-23 ENCOUNTER — Encounter: Payer: Self-pay | Admitting: Medical

## 2020-12-23 ENCOUNTER — Other Ambulatory Visit: Payer: Self-pay

## 2020-12-23 VITALS — BP 130/60 | HR 64 | Ht 62.5 in | Wt 162.4 lb

## 2020-12-23 DIAGNOSIS — I10 Essential (primary) hypertension: Secondary | ICD-10-CM

## 2020-12-23 DIAGNOSIS — J449 Chronic obstructive pulmonary disease, unspecified: Secondary | ICD-10-CM

## 2020-12-23 DIAGNOSIS — G458 Other transient cerebral ischemic attacks and related syndromes: Secondary | ICD-10-CM | POA: Diagnosis not present

## 2020-12-23 DIAGNOSIS — E785 Hyperlipidemia, unspecified: Secondary | ICD-10-CM

## 2020-12-23 DIAGNOSIS — R001 Bradycardia, unspecified: Secondary | ICD-10-CM | POA: Diagnosis not present

## 2020-12-23 DIAGNOSIS — Z87891 Personal history of nicotine dependence: Secondary | ICD-10-CM | POA: Diagnosis not present

## 2020-12-23 DIAGNOSIS — I5032 Chronic diastolic (congestive) heart failure: Secondary | ICD-10-CM

## 2020-12-23 NOTE — Patient Instructions (Signed)
Medication Instructions:  -Your physician recommends that you continue on your current medications as directed. Please refer to the Current Medication list given to you today.  *If you need a refill on your cardiac medications before your next appointment, please call your pharmacy*   Lab Work: - none ordered  If you have labs (blood work) drawn today and your tests are completely normal, you will receive your results only by: [place] (if you have MyChart) OR A paper copy in the mail If you have any lab test that is abnormal or we need to change your treatment, we will call you to review the results.   Testing/Procedures: - none ordered   Follow-Up: At [facility], you and your health needs are our priority.  As part of our continuing mission to provide you with exceptional heart care, we have created designated Provider Care Teams.  These Care Teams include your primary Cardiologist (physician) and Advanced Practice Providers (APPs -  Physician Assistants and Nurse Practitioners) who all work together to provide you with the care you need, when you need it.  We recommend signing up for the patient portal called "MyChart".  Sign up information is provided on this After Visit Summary.  MyChart is used to connect with patients for Virtual Visits (Telemedicine).  Patients are able to view lab/test results, encounter notes, upcoming appointments, etc.  Non-urgent messages can be sent to your provider as well.   To learn more about what you can do with MyChart, go to [email or web address].    Your next appointment:   3 month(s)  The format for your next appointment:   In Person  Provider:   You may see Ida Rogue, MD or one of the following Advanced Practice Providers on your designated Care Team:   Murray Hodgkins, NP Christell Faith, PA-C Marrianne Mood, PA-C Cadence Kathlen Mody, [place]   Other Instructions N/a

## 2020-12-24 ENCOUNTER — Telehealth: Payer: Self-pay | Admitting: Medical

## 2020-12-24 NOTE — Telephone Encounter (Signed)
Attempted to schedule.  LMOV to call office.    Placed recall.

## 2020-12-27 ENCOUNTER — Emergency Department: Payer: Medicare HMO

## 2020-12-27 ENCOUNTER — Emergency Department
Admission: EM | Admit: 2020-12-27 | Discharge: 2020-12-27 | Disposition: A | Discharge status: Home / Self Care | Payer: Medicare HMO | Attending: Emergency Medicine | Admitting: Emergency Medicine

## 2020-12-27 ENCOUNTER — Other Ambulatory Visit: Payer: Self-pay

## 2020-12-27 DIAGNOSIS — Z7951 Long term (current) use of inhaled steroids: Secondary | ICD-10-CM | POA: Diagnosis not present

## 2020-12-27 DIAGNOSIS — I110 Hypertensive heart disease with heart failure: Secondary | ICD-10-CM | POA: Diagnosis not present

## 2020-12-27 DIAGNOSIS — J449 Chronic obstructive pulmonary disease, unspecified: Secondary | ICD-10-CM | POA: Diagnosis not present

## 2020-12-27 DIAGNOSIS — R001 Bradycardia, unspecified: Secondary | ICD-10-CM | POA: Diagnosis not present

## 2020-12-27 DIAGNOSIS — J45909 Unspecified asthma, uncomplicated: Secondary | ICD-10-CM | POA: Diagnosis not present

## 2020-12-27 DIAGNOSIS — J439 Emphysema, unspecified: Secondary | ICD-10-CM | POA: Diagnosis not present

## 2020-12-27 DIAGNOSIS — Z7901 Long term (current) use of anticoagulants: Secondary | ICD-10-CM | POA: Insufficient documentation

## 2020-12-27 DIAGNOSIS — F419 Anxiety disorder, unspecified: Secondary | ICD-10-CM | POA: Diagnosis not present

## 2020-12-27 DIAGNOSIS — Z96642 Presence of left artificial hip joint: Secondary | ICD-10-CM | POA: Insufficient documentation

## 2020-12-27 DIAGNOSIS — I130 Hypertensive heart and chronic kidney disease with heart failure and stage 1 through stage 4 chronic kidney disease, or unspecified chronic kidney disease: Secondary | ICD-10-CM | POA: Diagnosis not present

## 2020-12-27 DIAGNOSIS — R0602 Shortness of breath: Secondary | ICD-10-CM | POA: Diagnosis not present

## 2020-12-27 DIAGNOSIS — I5032 Chronic diastolic (congestive) heart failure: Secondary | ICD-10-CM | POA: Diagnosis not present

## 2020-12-27 DIAGNOSIS — J441 Chronic obstructive pulmonary disease with (acute) exacerbation: Secondary | ICD-10-CM | POA: Diagnosis not present

## 2020-12-27 DIAGNOSIS — R413 Other amnesia: Secondary | ICD-10-CM | POA: Diagnosis not present

## 2020-12-27 DIAGNOSIS — I82B12 Acute embolism and thrombosis of left subclavian vein: Secondary | ICD-10-CM | POA: Diagnosis not present

## 2020-12-27 DIAGNOSIS — Z79899 Other long term (current) drug therapy: Secondary | ICD-10-CM | POA: Insufficient documentation

## 2020-12-27 DIAGNOSIS — Z87891 Personal history of nicotine dependence: Secondary | ICD-10-CM | POA: Insufficient documentation

## 2020-12-27 DIAGNOSIS — I509 Heart failure, unspecified: Secondary | ICD-10-CM | POA: Diagnosis not present

## 2020-12-27 DIAGNOSIS — F339 Major depressive disorder, recurrent, unspecified: Secondary | ICD-10-CM | POA: Diagnosis not present

## 2020-12-27 DIAGNOSIS — N189 Chronic kidney disease, unspecified: Secondary | ICD-10-CM | POA: Diagnosis not present

## 2020-12-27 LAB — TROPONIN I (HIGH SENSITIVITY)
Troponin I (High Sensitivity): 10 ng/L (ref <18)
Troponin I (High Sensitivity): 10 ng/L (ref <18)

## 2020-12-27 LAB — COMPREHENSIVE METABOLIC PANEL
ALT: 13 U/L (ref 0–44)
AST: 17 U/L (ref 15–41)
Albumin: 4.5 g/dL (ref 3.5–5.0)
Alkaline Phosphatase: 71 U/L (ref 38–126)
Anion gap: 9 (ref 5–15)
BUN: 31 mg/dL — ABNORMAL HIGH (ref 8–23)
CO2: 28 mmol/L (ref 22–32)
Calcium: 10.3 mg/dL (ref 8.9–10.3)
Chloride: 99 mmol/L (ref 98–111)
Creatinine, Ser: 1.67 mg/dL — ABNORMAL HIGH (ref 0.44–1.00)
GFR, Estimated: 32 mL/min — ABNORMAL LOW (ref >60)
Glucose, Bld: 100 mg/dL — ABNORMAL HIGH (ref 70–99)
Potassium: 4.8 mmol/L (ref 3.5–5.1)
Sodium: 136 mmol/L (ref 135–145)
Total Bilirubin: 0.7 mg/dL (ref 0.3–1.2)
Total Protein: 7.5 g/dL (ref 6.5–8.1)

## 2020-12-27 LAB — CBC
HCT: 39.2 % (ref 36.0–46.0)
Hemoglobin: 13.2 g/dL (ref 12.0–15.0)
MCH: 31.5 pg (ref 26.0–34.0)
MCHC: 33.7 g/dL (ref 30.0–36.0)
MCV: 93.6 fL (ref 80.0–100.0)
Platelets: 544 10*3/uL — ABNORMAL HIGH (ref 150–400)
RBC: 4.19 MIL/uL (ref 3.87–5.11)
RDW: 16.6 % — ABNORMAL HIGH (ref 11.5–15.5)
WBC: 8.4 10*3/uL (ref 4.0–10.5)
nRBC: 0 % (ref 0.0–0.2)

## 2020-12-27 MED ORDER — ALBUTEROL SULFATE (2.5 MG/3ML) 0.083% IN NEBU: 2.5000 mg | INHALATION_SOLUTION | Freq: Four times a day (QID) | RESPIRATORY_TRACT | 0 refills | Status: DC | PRN

## 2020-12-27 MED ORDER — METHYLPREDNISOLONE SODIUM SUCC 125 MG IJ SOLR
125.0000 mg | INTRAMUSCULAR | Status: AC
  Administered 2020-12-27: 125 mg via INTRAVENOUS
  Filled 2020-12-27: qty 2

## 2020-12-27 MED ORDER — IPRATROPIUM-ALBUTEROL 0.5-2.5 (3) MG/3ML IN SOLN
3.0000 mL | Freq: Once | RESPIRATORY_TRACT | Status: AC
  Administered 2020-12-27: 3 mL via RESPIRATORY_TRACT
  Filled 2020-12-27: qty 3

## 2020-12-27 MED ORDER — AZITHROMYCIN 500 MG PO TABS
500.0000 mg | ORAL_TABLET | Freq: Once | ORAL | Status: AC
  Administered 2020-12-27: 500 mg via ORAL
  Filled 2020-12-27: qty 1

## 2020-12-27 MED ORDER — ALBUTEROL SULFATE HFA 108 (90 BASE) MCG/ACT IN AERS
2.0000 | INHALATION_SPRAY | RESPIRATORY_TRACT | Status: DC | PRN
  Filled 2020-12-27: qty 6.7

## 2020-12-27 MED ORDER — IPRATROPIUM BROMIDE 0.02 % IN SOLN: 0.5000 mg | Freq: Four times a day (QID) | RESPIRATORY_TRACT | 0 refills | Status: DC | PRN

## 2020-12-27 MED ORDER — PREDNISONE 50 MG PO TABS: ORAL_TABLET | ORAL | 0 refills | Status: DC

## 2020-12-27 MED ORDER — AZITHROMYCIN 250 MG PO TABS: ORAL_TABLET | ORAL | 0 refills | Status: DC

## 2020-12-27 NOTE — ED Triage Notes (Signed)
Pt received the flu shot 10/3, pt started having difficulty with her breathing after and has gotten worse as time has gone by, pt was sent by [facility] today with difficulty breathing, pt reports that she's had to be admitted for similar in the past with her asthma and copd exacerbation. Pt has increased work of breathing in triage

## 2020-12-27 NOTE — ED Provider Notes (Signed)
[facility] Emergency Department Provider Note   ____________________________________________   Event Date/Time   First MD Initiated Contact with Patient 12/27/20 1104     (approximate)  I have reviewed the triage vital signs and the nursing notes.   HISTORY  Chief Complaint Respiratory Distress    HPI Pamela James is a 75 y.o. female history of asthma CHF and COPD  Since Monday has been experiencing a feeling of shortness of breath, slight wheezing and dry cough.  Feels like her symptoms of that of when she has had "COPD".  She does report her chest feels somewhat tight but not any pain or discomfort.  No leg swelling.  She had symptoms similar in the past with COPD.  She typically treats with inhalers including Combivent, but ran out of her Combivent inhaler recently and has not been able to use it.  No fevers or chills.  Reports symptoms present for about 5 days slowly worsening to the point that she went to urgent care today and was referred here for evaluation  Past Medical History:  Diagnosis Date   Anxiety    Asthma    CHF (congestive heart failure) (HCC)    COPD (chronic obstructive pulmonary disease) ([place])    Depression    GERD (gastroesophageal reflux disease)    Hypertension     Patient Active Problem List   Diagnosis Date Noted   Thrombocytosis 01/05/2020   Obstructive uropathy 11/10/2019   Hydronephrosis 11/10/2019   Pulmonary edema 11/10/2019   Acute hypoxemic respiratory failure ([place]) 11/09/2019   Acute congestive heart failure (HCC)    AKI (acute kidney injury) ([place])    Junctional bradycardia    Hyponatremia    Hyperkalemia    Acute on chronic respiratory failure with hypoxia ([place]) 09/15/2019   COPD exacerbation ([place]) 11/21/2017   Acute bronchitis 03/26/2016   Essential hypertension 03/26/2016   Chest pain 03/25/2016  Patient has a slightly distant history of previous venous blood clot.  Is currently on Eliquis as  well  Past Surgical History:  Procedure Laterality Date   ABDOMINAL HYSTERECTOMY     TOTAL HIP ARTHROPLASTY Left    TUBAL LIGATION      Prior to Admission medications   Medication Sig Start Date End Date Taking? Authorizing Provider  albuterol (PROVENTIL) (2.5 MG/3ML) 0.083% nebulizer solution Take 3 mLs (2.5 mg total) by nebulization every 6 (six) hours as needed for wheezing or shortness of breath. 12/27/20  Yes Delman Kitten, MD  azithromycin (ZITHROMAX) 250 MG tablet Take 1 tablet PO daily for 4 more days starting the day after your visit to the Emergency Department 12/27/20  Yes Delman Kitten, MD  citalopram (CELEXA) 10 MG tablet Take 1 tablet by mouth daily. 11/29/20  Yes [provider]  fluticasone-salmeterol (ADVAIR HFA) 230-21 MCG/ACT inhaler Inhale 2 puffs into the lungs in the morning and at bedtime. 06/21/20  Yes [provider]  furosemide (LASIX) 20 MG tablet Take 2 tablets by mouth daily. 11/21/20  Yes [provider]  ipratropium (ATROVENT) 0.02 % nebulizer solution Take 2.5 mLs (0.5 mg total) by nebulization every 6 (six) hours as needed for wheezing or shortness of breath. 12/27/20  Yes Delman Kitten, MD  metoprolol tartrate (LOPRESSOR) 25 MG tablet Take 1 tablet by mouth in the morning and at bedtime. 06/26/20  Yes [provider]  pravastatin (PRAVACHOL) 20 MG tablet Take 1 tablet by mouth at bedtime. 06/26/20  Yes [provider]  predniSONE (DELTASONE) 50 MG  tablet 1 tab by mouth daily 12/27/20  Yes Delman Kitten, MD  albuterol (PROVENTIL HFA) 108 (90 Base) MCG/ACT inhaler Inhale 2 puffs into the lungs every 4 (four) hours as needed for wheezing or shortness of breath. 11/17/19   Carrie Mew, MD  albuterol (PROVENTIL) (2.5 MG/3ML) 0.083% nebulizer solution Take 2.5 mg by nebulization every 6 (six) hours as needed for wheezing or shortness of breath.    [provider]  alendronate (FOSAMAX) 70 MG tablet Take 70 mg by mouth once a  week. Take with a full glass of water on an empty stomach.    [provider]  amLODipine (NORVASC) 5 MG tablet Take 5 mg by mouth daily. 12/16/20 12/16/21  [provider]  apixaban (ELIQUIS) 5 MG TABS tablet Take 1 tablet (5 mg total) by mouth 2 (two) times daily. 09/29/19   Ezekiel Slocumb, DO  baclofen (LIORESAL) 10 MG tablet  01/09/20   [provider]  bisacodyl (DULCOLAX) 5 MG EC tablet Take by mouth.    [provider]  citalopram (CELEXA) 10 MG tablet Take 10 mg by mouth daily.  12/06/19 12/23/20  [provider]  Docusate Sodium (DSS) 100 MG CAPS Take by mouth daily as needed.     [provider]  feeding supplement, ENSURE ENLIVE, (ENSURE ENLIVE) LIQD Take 237 mLs by mouth daily. 09/27/19   Nicole Kindred A, DO  ferrous sulfate 325 (65 FE) MG EC tablet Take 1 tablet (325 mg total) by mouth 2 (two) times daily. 11/11/19 12/23/20  Mercy Riding, MD  fluticasone-salmeterol (ADVAIR HFA) 230-21 MCG/ACT inhaler Inhale 2 puffs into the lungs 2 (two) times daily.    [provider]  furosemide (LASIX) 20 MG tablet Take 1 tablet (20 mg total) by mouth daily. 11/13/19 12/23/20  Mercy Riding, MD  gabapentin (NEURONTIN) 300 MG capsule Take 300 mg by mouth 2 (two) times daily.    [provider]  hydrALAZINE (APRESOLINE) 25 MG tablet Take 25 mg by mouth in the morning and at bedtime.    [provider]  Ipratropium-Albuterol (COMBIVENT) 20-100 MCG/ACT AERS respimat Inhale 1 puff into the lungs 4 (four) times daily as needed for wheezing or shortness of breath. 11/11/19   Mercy Riding, MD  lidocaine (LIDODERM) 5 % Place 1 patch onto the skin daily. Remove & Discard patch within 12 hours or as directed by MD 09/28/19   Nicole Kindred A, DO  memantine (NAMENDA) 5 MG tablet Take 1 tablet by mouth 2 (two) times daily. 12/16/20 12/16/21  [provider]  metoprolol tartrate (LOPRESSOR) 25 MG tablet Take 0.5 tablets (12.5 mg  total) by mouth 2 (two) times daily. 11/11/19   Mercy Riding, MD  nitroGLYCERIN (NITROSTAT) 0.4 MG SL tablet Place 1 tablet (0.4 mg total) under the tongue every 5 (five) minutes as needed for chest pain. 09/27/19   Ezekiel Slocumb, DO  omeprazole (PRILOSEC) 20 MG capsule Take 20 mg by mouth daily.    [provider]  pravastatin (PRAVACHOL) 20 MG tablet Take 20 mg by mouth at bedtime.     [provider]  senna-docusate (SENOKOT-S) 8.6-50 MG tablet Take 1 tablet by mouth 2 (two) times daily between meals as needed for mild constipation. 11/11/19   Mercy Riding, MD  tamsulosin (FLOMAX) 0.4 MG CAPS capsule Take 0.4 mg by mouth daily.    [provider]  traMADol Veatrice Bourbon) 50 MG tablet  01/09/20   [provider]  traZODone (DESYREL) 100 MG tablet Take 50-100 mg by mouth at bedtime. 11/06/19   [provider]  traZODone (DESYREL) 150 MG tablet Take 150 mg by mouth at bedtime. 12/17/20   [provider]  umeclidinium bromide (INCRUSE ELLIPTA) 62.5 MCG/INH AEPB Inhale 1 puff into the lungs daily. 11/11/19   Mercy Riding, MD  vitamin B-12 (CYANOCOBALAMIN) 1000 MCG tablet Take 1,000 mcg by mouth daily.    [provider]    Allergies Morphine and related, Nsaids, and Aspirin  Family History  Problem Relation Age of Onset   Heart disease Father    Heart disease Sister     Social History Social History   Tobacco Use   Smoking status: Former    Packs/day: 0.75    Years: 40.00    Pack years: 30.00    Types: Cigarettes    Quit date: 11/05/2019    Years since quitting: 1.1   Smokeless tobacco: Never  Vaping Use   Vaping Use: Never used  Substance Use Topics   Alcohol use: No   Drug use: No    Review of Systems Constitutional: No fever/chills Eyes: No visual changes. ENT: No sore throat. Cardiovascular: Denies chest pain. Respiratory: See HPI Gastrointestinal: No abdominal pain.   Genitourinary: Negative for  dysuria. Musculoskeletal: Negative for back pain. Neurological: Negative for headaches or weakness.    ____________________________________________   PHYSICAL EXAM:  VITAL SIGNS: ED Triage Vitals  Enc Vitals Group     BP 12/27/20 1106 (!) 174/70     Pulse Rate 12/27/20 1106 (!) 57     Resp 12/27/20 1106 (!) 40     Temp 12/27/20 1106 98 F (36.7 C)     Temp Source 12/27/20 1106 Oral     SpO2 12/27/20 1106 97 %     Weight 12/27/20 1057 163 lb (73.9 kg)     Height 12/27/20 1057 5\' 2"  (1.575 m)     Head Circumference --      Peak Flow --      Pain Score 12/27/20 1056 7     Pain Loc --      Pain Edu? --      Excl. in [place]? --     Constitutional: Alert and oriented.  Mildly ill-appearing, somewhat tachypneic and appears short of breath. Eyes: Conjunctivae are normal. Head: Atraumatic. Nose: No congestion/rhinnorhea. Mouth/Throat: Mucous membranes are moist. Neck: No stridor.  Cardiovascular: Normal rate, regular rhythm. Grossly normal heart sounds.  Good peripheral circulation. Respiratory: Modest tachypnea.  No tripoding.  No signs of respiratory failure but demonstrates use of accessory muscles speaks in phrases and is tachypneic.  She has diffuse notable expiratory wheezing throughout all lung fields.  Occasional dry cough Gastrointestinal: Soft and nontender. No distention. Musculoskeletal: No lower extremity tenderness nor edema.  No upper extremity edema.  No signs or symptom suggestive of acute DVT Neurologic:  Normal speech and language. No gross focal neurologic deficits are appreciated.  Skin:  Skin is warm, dry and intact. No rash noted. Psychiatric: Mood and affect are normal. Speech and behavior are normal.  ____________________________________________   LABS (all labs ordered are listed, but only abnormal results are displayed)  Labs Reviewed  CBC - Abnormal; Notable for the following components:      Result Value   RDW 16.6 (*)    Platelets 544 (*)    All  other components within normal limits  COMPREHENSIVE METABOLIC PANEL - Abnormal; Notable for the following components:   Glucose,  Bld 100 (*)    BUN 31 (*)    Creatinine, Ser 1.67 (*)    GFR, Estimated 32 (*)    All other components within normal limits  TROPONIN I (HIGH SENSITIVITY)  TROPONIN I (HIGH SENSITIVITY)   ____________________________________________  EKG  Reviewed inter by me at 1055 Heart rate 55 QRS 79 QTc 360 Sinus rhythm, first-degree AV block.  No evidence of acute ischemia.  Slight baseline artifact ____________________________________________  RADIOLOGY  DG Chest Port 1 View  Result Date: 12/27/2020 CLINICAL DATA:  Shortness of breath EXAM: PORTABLE CHEST 1 VIEW COMPARISON:  11/17/2019 FINDINGS: Generous lung volumes and interstitial prominence. Mild airway thickening and emphysema seen on 2021 CT. There is no edema, consolidation, effusion, or pneumothorax. Normal heart size and mediastinal contours. IMPRESSION: COPD without acute superimposed finding. Electronically Signed   By: Jorje Guild M.D.   On: 12/27/2020 11:40    Chest x-ray reviewed, negative for acute infiltrate ____________________________________________   PROCEDURES  Procedure(s) performed: None  Procedures  Critical Care performed: No  ____________________________________________   INITIAL IMPRESSION / ASSESSMENT AND PLAN / ED COURSE  Pertinent labs & imaging results that were available during my care of the patient were reviewed by me and considered in my medical decision making (see chart for details).   Patient presents for shortness of breath slowly worsening over about 5 days.  Clinical examination seems very consistent with bronchospasm.  Notable expiratory wheezing.  Also associates slight dry cough.  Also has recently run out of her Combivent suspect also contributing.  Clinical examination very reassuring except for signs of bronchospasm and wheezing.  She denies any  pleuritic chest pain.  She is anticoagulated significantly decrease the risk of venous thromboembolic disease.  She does not show any clinical examination findings that would be consistent with DVT or PE at this time.  No ripping tearing or moving pain denies chest pain or other reports a tightness feeling that she has had with COPD in the past.  We will treat aggressively with nebulizers, steroids and initiate azithromycin.  No clinical signs or symptoms suggest acute infectious etiology such as pneumonia.  No pneumothorax on x-ray.  No clinical signs of CHF such as edema  Clinical Course as of 12/27/20 1711  Fri Dec 27, 2020  1427 Reports she feels much improved.  She appears notably improved.  Lung sounds are now clear she is speaking full clear sentences without distress.  She reports she feels back to normal is able to ambulate without hypoxia around the ER and appears much improved.  Currently resting comfortably requesting discharge Respiratory rate 18-20, unlabored normal clear breath sounds.  Discussed careful treatment recommendations and precautions.  Discussed case with her son and family at the bedside as well.  Will provide prescription for albuterol and ipratropium nebulizer solution his family reports that her pharmacy cannot currently fill her DuoNeb prescription, will divide out into individual medications.  Careful return precautions advised.  Patient understanding agreeable.  Well appearing no distress no pain no ongoing discomfort.  Asymptomatic at this time [MQ]    Clinical Course User Index [MQ] Delman Kitten, MD   Vitals:   12/27/20 1300 12/27/20 1430  BP: 130/64 125/70  Pulse: (!) 56 (!) 58  Resp: (!) 26 (!) 24  Temp:  98 F (36.7 C)  SpO2: 95% 97%       ____________________________________________   FINAL CLINICAL IMPRESSION(S) / ED DIAGNOSES  Final diagnoses:  COPD exacerbation ([place])  Note:  This document was prepared using Therapist, sports and may include unintentional dictation errors       Delman Kitten, MD 12/27/20 1713

## 2020-12-27 NOTE — ED Notes (Signed)
Pt ambulated in hallway on room air. O2 maintained between 92-94% throughtout the walk. Pt does not appear to be in any distress during walk.  MD made aware.

## 2020-12-27 NOTE — ED Notes (Signed)
Md at bedside at this time

## 2020-12-27 NOTE — ED Notes (Signed)
Pt neb completed. Pt states feeling a little better after neb and solumedrol. Denies any pain.

## 2020-12-27 NOTE — ED Notes (Signed)
Discharge instructions reviewed with patient. No questions or concerns at this time. Pt wheeled to waiting area to daughter to transport home.

## 2021-01-02 DIAGNOSIS — Z96642 Presence of left artificial hip joint: Secondary | ICD-10-CM | POA: Diagnosis not present

## 2021-01-02 DIAGNOSIS — Z79899 Other long term (current) drug therapy: Secondary | ICD-10-CM | POA: Diagnosis not present

## 2021-01-02 DIAGNOSIS — D75839 Thrombocytosis, unspecified: Secondary | ICD-10-CM | POA: Diagnosis not present

## 2021-01-02 DIAGNOSIS — M810 Age-related osteoporosis without current pathological fracture: Secondary | ICD-10-CM | POA: Diagnosis not present

## 2021-01-02 DIAGNOSIS — F411 Generalized anxiety disorder: Secondary | ICD-10-CM | POA: Diagnosis not present

## 2021-01-02 DIAGNOSIS — J449 Chronic obstructive pulmonary disease, unspecified: Secondary | ICD-10-CM | POA: Diagnosis not present

## 2021-01-02 DIAGNOSIS — I130 Hypertensive heart and chronic kidney disease with heart failure and stage 1 through stage 4 chronic kidney disease, or unspecified chronic kidney disease: Secondary | ICD-10-CM | POA: Diagnosis not present

## 2021-01-02 DIAGNOSIS — F339 Major depressive disorder, recurrent, unspecified: Secondary | ICD-10-CM | POA: Diagnosis not present

## 2021-01-02 DIAGNOSIS — I5030 Unspecified diastolic (congestive) heart failure: Secondary | ICD-10-CM | POA: Diagnosis not present

## 2021-01-03 DIAGNOSIS — N1330 Unspecified hydronephrosis: Secondary | ICD-10-CM | POA: Diagnosis not present

## 2021-01-03 DIAGNOSIS — C689 Malignant neoplasm of urinary organ, unspecified: Secondary | ICD-10-CM | POA: Diagnosis not present

## 2021-02-17 DIAGNOSIS — N189 Chronic kidney disease, unspecified: Secondary | ICD-10-CM | POA: Diagnosis not present

## 2021-02-17 DIAGNOSIS — Z87891 Personal history of nicotine dependence: Secondary | ICD-10-CM | POA: Diagnosis not present

## 2021-02-17 DIAGNOSIS — F339 Major depressive disorder, recurrent, unspecified: Secondary | ICD-10-CM | POA: Diagnosis not present

## 2021-02-17 DIAGNOSIS — F411 Generalized anxiety disorder: Secondary | ICD-10-CM | POA: Diagnosis not present

## 2021-02-17 DIAGNOSIS — I5030 Unspecified diastolic (congestive) heart failure: Secondary | ICD-10-CM | POA: Diagnosis not present

## 2021-02-17 DIAGNOSIS — I82B12 Acute embolism and thrombosis of left subclavian vein: Secondary | ICD-10-CM | POA: Diagnosis not present

## 2021-02-17 DIAGNOSIS — I130 Hypertensive heart and chronic kidney disease with heart failure and stage 1 through stage 4 chronic kidney disease, or unspecified chronic kidney disease: Secondary | ICD-10-CM | POA: Diagnosis not present

## 2021-02-17 DIAGNOSIS — J441 Chronic obstructive pulmonary disease with (acute) exacerbation: Secondary | ICD-10-CM | POA: Diagnosis not present

## 2021-02-18 ENCOUNTER — Other Ambulatory Visit: Payer: Self-pay | Admitting: Internal Medicine

## 2021-02-18 DIAGNOSIS — E049 Nontoxic goiter, unspecified: Secondary | ICD-10-CM

## 2021-03-26 DIAGNOSIS — Z86010 Personal history of colonic polyps: Secondary | ICD-10-CM | POA: Diagnosis not present

## 2021-03-27 ENCOUNTER — Telehealth: Payer: Self-pay | Admitting: *Deleted

## 2021-03-27 NOTE — Telephone Encounter (Signed)
° °  Pre-operative Risk Assessment    Patient Name: Pamela James  DOB: May 29, 1945 MRN: [id number]      Request for Surgical Clearance    Procedure:   COLONOSCOPY  Date of Surgery:  Clearance 05/26/21                                 Surgeon:  DR. Raylene Miyamoto Surgeon's Group or Practice Name:  [facility] Phone number:  [phone] Fax number:  [phone]   Type of Clearance Requested:   - Medical  - Pharmacy:  Hold Apixaban (Eliquis)     Type of Anesthesia:  Not Indicated (PROPOFOL?)   Additional requests/questions:    Jiles Prows   03/27/2021, 2:21 PM

## 2021-03-27 NOTE — Telephone Encounter (Signed)
° °  Name: Ranay Ketter  DOB: November 05, 1945  MRN: [id number]  Primary Cardiologist: Ida Rogue, MD  Chart reviewed as part of pre-operative protocol coverage. Because of Sandeep Delagarza past medical history and time since last visit, she will require a follow-up visit in order to better assess preoperative cardiovascular risk.  Pre-op covering staff: - Please schedule appointment and call patient to inform them. If patient already had an upcoming appointment within acceptable timeframe, please add "pre-op clearance" to the appointment notes so provider is aware. - Please contact requesting surgeon's office via preferred method (i.e, phone, fax) to inform them of need for appointment prior to surgery.  If applicable, this message will also be routed to pharmacy pool and/or primary cardiologist for input on holding anticoagulant/antiplatelet agent as requested below so that this information is available to the clearing provider at time of patient's appointment.   Patient take Eliquis for subclavian artery thrombosis, will defer management of Eliquis to her PCP.   [place], [place]  03/27/2021, 4:17 PM

## 2021-03-27 NOTE — Telephone Encounter (Signed)
DPR on file ok to s/w the pt's son Venora Maples. Venora Maples is agreeable to pre op appt for pt. He does also request that he would like for his mother to have some lab work done. He says her "potassium and everything are off". I assured him that I would make a note of that for the provider. Pt son Venora Maples is grateful for the help and the care we provide for his mom. I will forward notes to [facility] for upcoming appt. Will send FYI to requesting office pt hs appt 04/22/21.

## 2021-04-21 NOTE — Progress Notes (Signed)
Cardiology Office Note:    Date:  04/22/2021   ID:  Pamela James, DOB 1946/01/19, MRN [id number]  PCP:  Tracie Harrier, MD   [facility] Providers Cardiologist:  Ida Rogue, MD Click to update primary MD,subspecialty MD or APP then REFRESH:1}    Referring MD: Tracie Harrier, MD   Chief Complaint: preoperative cardiac evaluation  History of Present Illness:    Pamela James is a 76 y.o. female with a hx of HFrEF, HTN, aortic atherosclerosis, COPD, hyperlipidemia, left ureteral tumor, and vitamin d deficiency. 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   She has a history of syncope with bradycardia that resulted in CPR in July 2021. Intubation with admission to ICU was complicated by aspiration pneumonia, pulmonary edema requiring IV Lasix, left subclavian clot (started on DOAC), hypertensive urgency, questionable alcohol withdrawal, metabolic encephalopathy, and bradycardia/hypotension. 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 Occlusive of axillary paired brachial veins, and nonocclusive at subclavian vein. Superficial occlusive thrombophlebitis of cephalic vein, basilic vein, radial vein, and ulnar vein. Following extubation and transfer from ICU, she quickly developed hypertensive urgency and was transferred back to ICU for nicardipine gtt. Was d/ced home to live with son 09/27/19.   Return to ED 11/09/19 found to be hypoxic, volume overloaded, and hyperkalemic with ARF and severe left hydronephrosis. Intermittent bradycardia was noted with atropine administration, junctional rhythm at 46 bpm noted and thought to be due to electrolyte abnormality (hyponatremia, hyperkalemia). EKG showed improved rate and resolution of junctional rhythm with improved electrolytes. Limited echo revealed LVEF 60-65%, mild LVH. She followed up with HF clinic on 11/23/19 with minimal DOE with palpitations. On 12/15/19, she presented  to ED 2/2 home health reporting K+ significantly elevated at 7.8. Recheck showed 4.6. At her office visit 03/25/20, she was doing well with her sliding scale Lasix therapy based on weight and a six month f/u was recommended.   She was last seen in our office 12/23/20 by Tarri Glenn, PA at which time no changes were made to her plan of treatment and she was advised to f/u in 3 months.   She is here today for preoperative cardiac evaluation for upcoming colonoscopy. 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 She denies chest pain, shortness of breath, lower extremity edema, fatigue, palpitations, melena, hematuria, hemoptysis, diaphoresis, weakness, presyncope, syncope, orthopnea, and PND. Is not very active during the day. Does not feel like doing house work, states "I feel lazy." States this is her usual state of health and denies worsening fatigue or other concerns. Limiting fluids to 2 liters daily and monitoring her weight.  She denies weight gain.  Lives with her son and his wife who both work full-time.  She has 2 dogs that keep her company.    Past Medical History:  Diagnosis Date   Anxiety    Asthma    CHF (congestive heart failure) (HCC)    COPD (chronic obstructive pulmonary disease) (HCC)    Depression    GERD (gastroesophageal reflux disease)    Hypertension     Past Surgical History:  Procedure Laterality Date   ABDOMINAL HYSTERECTOMY     TOTAL HIP ARTHROPLASTY Left    TUBAL LIGATION      Current Medications: Current Meds  Medication Sig   albuterol (PROVENTIL HFA) 108 (90 Base) MCG/ACT inhaler Inhale 2 puffs into the lungs  every 4 (four) hours as needed for wheezing or shortness of breath.   albuterol (PROVENTIL) (2.5 MG/3ML) 0.083% nebulizer solution Take 2.5 mg by nebulization every 6 (six) hours as needed for wheezing or shortness of breath.   alendronate (FOSAMAX) 70 MG tablet  Take 70 mg by mouth once a week. Take with a full glass of water on an empty stomach.   ALPRAZolam (XANAX) 0.25 MG tablet Take 0.25 mg by mouth daily as needed.   amLODipine (NORVASC) 5 MG tablet Take 5 mg by mouth daily.   apixaban (ELIQUIS) 5 MG TABS tablet Take 1 tablet (5 mg total) by mouth 2 (two) times daily.   bisacodyl (DULCOLAX) 5 MG EC tablet Take 5 mg by mouth daily as needed.   citalopram (CELEXA) 10 MG tablet Take 10 mg by mouth daily.    Docusate Sodium (DSS) 100 MG CAPS Take by mouth daily as needed.    Ensure (ENSURE) Take 237 mLs by mouth 2 (two) times daily between meals.   ferrous sulfate 325 (65 FE) MG EC tablet Take 1 tablet (325 mg total) by mouth 2 (two) times daily.   furosemide (LASIX) 20 MG tablet Take 1 tablet (20 mg total) by mouth daily.   hydrALAZINE (APRESOLINE) 25 MG tablet Take 25 mg by mouth in the morning and at bedtime.   ipratropium (ATROVENT) 0.02 % nebulizer solution Take 2.5 mLs (0.5 mg total) by nebulization every 6 (six) hours as needed for wheezing or shortness of breath.   memantine (NAMENDA) 5 MG tablet Take 1 tablet by mouth 2 (two) times daily.   nitroGLYCERIN (NITROSTAT) 0.4 MG SL tablet Place 1 tablet (0.4 mg total) under the tongue every 5 (five) minutes as needed for chest pain.   omeprazole (PRILOSEC) 20 MG capsule Take 20 mg by mouth daily.   pravastatin (PRAVACHOL) 20 MG tablet Take 20 mg by mouth at bedtime.    predniSONE (DELTASONE) 50 MG tablet Take 50 mg by mouth daily as needed.   tamsulosin (FLOMAX) 0.4 MG CAPS capsule Take 0.4 mg by mouth daily.   traMADol (ULTRAM) 50 MG tablet Take 50 mg by mouth at bedtime.   traZODone (DESYREL) 150 MG tablet Take 150 mg by mouth at bedtime.   vitamin B-12 (CYANOCOBALAMIN) 1000 MCG tablet Take 1,000 mcg by mouth daily.   [DISCONTINUED] Fluticasone-Umeclidin-Vilant (TRELEGY ELLIPTA) 100-62.5-25 MCG/ACT AEPB Inhale 1 puff into the lungs daily.   [DISCONTINUED] metoprolol tartrate (LOPRESSOR) 25 MG  tablet Take 0.5 tablets (12.5 mg total) by mouth 2 (two) times daily.     Allergies:   Morphine and related, Nsaids, and Aspirin   Social History   Socioeconomic History   Marital status: Single    Spouse name: Not on file   Number of children: Not on file   Years of education: Not on file   Highest education level: Not on file  Occupational History   Occupation: retired  Tobacco Use   Smoking status: Former    Packs/day: 0.75    Years: 40.00    Pack years: 30.00    Types: Cigarettes    Quit date: 11/05/2019    Years since quitting: 1.4   Smokeless tobacco: Never  Vaping Use   Vaping Use: Never used  Substance and Sexual Activity   Alcohol use: No   Drug use: No   Sexual activity: Not on file  Other Topics Concern   Not on file  Social History Narrative   Not on file   Social  Determinants of Health   Financial Resource Strain: Not on file  Food Insecurity: Not on file  Transportation Needs: Not on file  Physical Activity: Not on file  Stress: Not on file  Social Connections: Not on file     Family History: The patient's family history includes Cancer in her brother; Heart disease in her father and sister.  ROS:   Please see the history of present illness.  All other systems reviewed and are negative.  Labs/Other Studies Reviewed:    The following studies were reviewed today:  Echo 12/17/20  Left Ventricle: Left ventricular ejection fraction, by estimation, is 60  to 65%. The left ventricle has normal function. The left ventricle has no  regional wall motion abnormalities. The average left ventricular global  longitudinal strain is -14.7 %.  The left ventricular internal cavity size was normal in size. There is  moderate left ventricular hypertrophy. Left ventricular diastolic  parameters are consistent with Grade II diastolic dysfunction  (pseudonormalization).  Right Ventricle: The right ventricular size is normal. No increase in  right ventricular  wall thickness. Right ventricular systolic function is  normal. Tricuspid regurgitation signal is inadequate for assessing PA  pressure.  Left Atrium: Left atrial size was normal in size.  Right Atrium: Right atrial size was normal in size.  Pericardium: There is no evidence of pericardial effusion.  Mitral Valve: The mitral valve is normal in structure. Mild to moderate  mitral valve regurgitation. No evidence of mitral valve stenosis.  Tricuspid Valve: The tricuspid valve is normal in structure. Tricuspid  valve regurgitation is not demonstrated. No evidence of tricuspid  stenosis.  Aortic Valve: The aortic valve was not well visualized. Aortic valve  regurgitation is mild. No aortic stenosis is present. Aortic valve mean  gradient measures 4.0 mmHg. Aortic valve peak gradient measures 7.1 mmHg.  Aortic valve area, by VTI measures 2.98 cm.  Pulmonic Valve: The pulmonic valve was normal in structure. Pulmonic valve  regurgitation is not visualized. No evidence of pulmonic stenosis.  Aorta: The aortic root is normal in size and structure.  Venous: The inferior vena cava is normal in size with greater than 50%  respiratory variability, suggesting right atrial pressure of 3 mmHg.  IAS/Shunts: No atrial level shunt detected by color flow Doppler.   Chest CT Lung Cancer Screening 7/21  Lung-RADS 2, benign appearance or behavior. Continue annual screening with low-dose chest CT without contrast in 12 months.   Additional focal patchy opacity in the medial left lower lobe, possibly round atelectasis, although mild infection/pneumonia could also have this appearance. Consider follow-up CT chest in 3 months to document clearance.   Aortic Atherosclerosis (ICD10-I70.0) and Emphysema (ICD10-J43.9).   Myoview 1/18  The study is normal. This is a low risk study. The left ventricular ejection fraction is normal (55-65%). There was no ST segment deviation noted during stress.   No  ischemia. Normal heart function    Recent Labs: 12/27/2020: ALT 13; BUN 31; Creatinine, Ser 1.67; Hemoglobin 13.2; Platelets 544; Potassium 4.8; Sodium 136  Care Everywhere 01/03/21 Creatinine 1.8, K+ 5  Recent Lipid Panel Care Everywhere 12/09/20 Total cholesterol 139 Trig 80 HDL 61.7 LDL 61   Risk Assessment/Calculations:      Physical Exam:    VS:  BP 118/66 (BP Location: Left Arm, Patient Position: Sitting, Cuff Size: Large)    Pulse (!) 46    Ht 5\' 3"  (1.6 m)    Wt 170 lb (77.1 kg)    SpO2  98%    BMI 30.11 kg/m     Wt Readings from Last 3 Encounters:  04/22/21 170 lb (77.1 kg)  12/27/20 163 lb (73.9 kg)  12/23/20 162 lb 6 oz (73.7 kg)     GEN:  Well nourished, well developed in no acute distress HEENT: Normal NECK: No JVD; No carotid bruits CARDIAC: RRR, 2/6 systolic murmur. No rubs, gallops RESPIRATORY:  Clear to auscultation without rales, wheezing or rhonchi  ABDOMEN: Soft, non-tender, non-distended MUSCULOSKELETAL:  No edema; No deformity. 2+ pedal/radial pulses, equal bilaterally SKIN: Warm and dry NEUROLOGIC:  Alert and oriented x 3 PSYCHIATRIC:  Normal affect   EKG:  EKG is ordered today.  The ekg ordered today demonstrates Junctional bradycardia at 46 bpm  Diagnoses:    1. Junctional bradycardia   2. Chronic combined systolic and diastolic CHF, NYHA class 2 ([place])   3. Hyperlipidemia LDL goal <70   4. Preoperative cardiovascular examination   5. Hyperkalemia    Assessment and Plan:     Junctional bradycardia: EKG today reveals junctional bradycardia. Most recent EKG for comparison 12/30/20 was sinus bradycardia at 55 bpm, 1st degree AV block. She is asymptomatic, has good perfusion, and mentation. We will d/c metoprolol and place a 7 day Zio monitor. If cardiac monitor reveals worsening bradycardia off metoprolol, will refer to EP for consideration of device. Checking electrolytes today.   Preoperative cardiac evaluation: As noted above, she has  worsening bradycardia. We will await results of cardiac monitor and lab results before clearing her for upcoming colonoscopy.   Chronic combined CHF: LVEF 60-65%, no rwma, moderate LVH, G2DD by echo 10/22. Appears euvolemic today. Denies dyspnea, orthopnea, PND, edema. Not on ACE-I/ARB/MRA/ARNI due to electrolyte abnormality in the past. If electrolytes are stable today, could consider addition of GDMT in the future. Encouraged increased physical activity (walking around the house and the yard at regular intervals through the day). Continue Lasix.   Essential hypertension: BP well-controlled today. She does not monitor at home but has a home cuff. Encouraged periodic monitoring at home. Encouraged increased physical activity. Continue Lasix, amlodipine.   Hyperkalemia: History of elevated K+ 6.0 03/25/20 and during hospitalization thought to be contributing to junctional rhythm during hospitalization August 2021. Not on potassium sparing medication or supplementation. Will recheck basic metabolic panel today.  Aortic atherosclerosis/Hyperlipidemia LDL goal < 70: Noted on CT 7/21. Not on aspirin in the setting of taking Eliquis for subclavian thrombus. LDL 61 9/22, at goal. Continue statin.    Disposition: 1 mo with Dr. Rockey Situ or APP     Medication Adjustments/Labs and Tests Ordered: Current medicines are reviewed at length with the patient today.  Concerns regarding medicines are outlined above.  Orders Placed This Encounter  Procedures   CBC   Basic metabolic panel   Magnesium   TSH   LONG TERM MONITOR (3-14 DAYS)   EKG 12-Lead   No orders of the defined types were placed in this encounter.   Patient Instructions  Medication Instructions:  Your physician has recommended you make the following change in your medication:   STOP Metoprolol tartrate  *If you need a refill on your cardiac medications before your next appointment, please call your pharmacy*   Lab Work: CBC, BMET, Mag,  and TSH today  If you have labs (blood work) drawn today and your tests are completely normal, you will receive your results only by: [place] (if you have MyChart) OR A paper copy in the mail If you have  any lab test that is abnormal or we need to change your treatment, we will call you to review the results.   Testing/Procedures: Your provider has ordered a heart monitor to wear for 7 days. This will be mailed to your home with instructions on placement. Once you have finished the time frame requested, you will return monitor in box provided.      Follow-Up: At [facility], you and your health needs are our priority.  As part of our continuing mission to provide you with exceptional heart care, we have created designated Provider Care Teams.  These Care Teams include your primary Cardiologist (physician) and Advanced Practice Providers (APPs -  Physician Assistants and Nurse Practitioners) who all work together to provide you with the care you need, when you need it.   Your next appointment:   1 month(s) before March 13th  The format for your next appointment:   In Person  Provider:   Ida Rogue, MD or Christell Faith, PA-C     Signed, Giordano Getman, Lanice Schwab, NP  04/22/2021 10:08 AM    [place]

## 2021-04-22 ENCOUNTER — Encounter: Payer: Self-pay | Admitting: Physician Assistant

## 2021-04-22 ENCOUNTER — Ambulatory Visit (INDEPENDENT_AMBULATORY_CARE_PROVIDER_SITE_OTHER): Payer: Medicare HMO

## 2021-04-22 ENCOUNTER — Ambulatory Visit (INDEPENDENT_AMBULATORY_CARE_PROVIDER_SITE_OTHER): Payer: Medicare HMO | Admitting: Nurse Practitioner

## 2021-04-22 ENCOUNTER — Other Ambulatory Visit: Payer: Self-pay

## 2021-04-22 VITALS — BP 118/66 | HR 46 | Ht 63.0 in | Wt 170.0 lb

## 2021-04-22 DIAGNOSIS — I700 Atherosclerosis of aorta: Secondary | ICD-10-CM | POA: Diagnosis not present

## 2021-04-22 DIAGNOSIS — I5042 Chronic combined systolic (congestive) and diastolic (congestive) heart failure: Secondary | ICD-10-CM | POA: Diagnosis not present

## 2021-04-22 DIAGNOSIS — I5032 Chronic diastolic (congestive) heart failure: Secondary | ICD-10-CM

## 2021-04-22 DIAGNOSIS — E875 Hyperkalemia: Secondary | ICD-10-CM

## 2021-04-22 DIAGNOSIS — R001 Bradycardia, unspecified: Secondary | ICD-10-CM | POA: Diagnosis not present

## 2021-04-22 DIAGNOSIS — Z01810 Encounter for preprocedural cardiovascular examination: Secondary | ICD-10-CM | POA: Diagnosis not present

## 2021-04-22 DIAGNOSIS — E785 Hyperlipidemia, unspecified: Secondary | ICD-10-CM

## 2021-04-22 NOTE — Patient Instructions (Addendum)
Medication Instructions:  Your physician has recommended you make the following change in your medication:   STOP Metoprolol tartrate  *If you need a refill on your cardiac medications before your next appointment, please call your pharmacy*   Lab Work: CBC, BMET, Mag, and TSH today  If you have labs (blood work) drawn today and your tests are completely normal, you will receive your results only by: [place] (if you have MyChart) OR A paper copy in the mail If you have any lab test that is abnormal or we need to change your treatment, we will call you to review the results.   Testing/Procedures: Your provider has ordered a heart monitor to wear for 7 days. This will be mailed to your home with instructions on placement. Once you have finished the time frame requested, you will return monitor in box provided.      Follow-Up: At [facility], you and your health needs are our priority.  As part of our continuing mission to provide you with exceptional heart care, we have created designated Provider Care Teams.  These Care Teams include your primary Cardiologist (physician) and Advanced Practice Providers (APPs -  Physician Assistants and Nurse Practitioners) who all work together to provide you with the care you need, when you need it.   Your next appointment:   1 month(s) before March 13th  The format for your next appointment:   In Person  Provider:   Ida Rogue, MD or Christell Faith, PA-C

## 2021-04-23 ENCOUNTER — Telehealth: Payer: Self-pay | Admitting: Nurse Practitioner

## 2021-04-23 DIAGNOSIS — E875 Hyperkalemia: Secondary | ICD-10-CM

## 2021-04-23 DIAGNOSIS — I5032 Chronic diastolic (congestive) heart failure: Secondary | ICD-10-CM

## 2021-04-23 DIAGNOSIS — Z79899 Other long term (current) drug therapy: Secondary | ICD-10-CM

## 2021-04-23 LAB — BASIC METABOLIC PANEL
BUN/Creatinine Ratio: 18 (ref 12–28)
BUN: 32 mg/dL — ABNORMAL HIGH (ref 8–27)
CO2: 24 mmol/L (ref 20–29)
Calcium: 10.1 mg/dL (ref 8.7–10.3)
Chloride: 101 mmol/L (ref 96–106)
Creatinine, Ser: 1.79 mg/dL — ABNORMAL HIGH (ref 0.57–1.00)
Glucose: 102 mg/dL — ABNORMAL HIGH (ref 70–99)
Potassium: 5.6 mmol/L — ABNORMAL HIGH (ref 3.5–5.2)
Sodium: 141 mmol/L (ref 134–144)
eGFR: 29 mL/min/{1.73_m2} — ABNORMAL LOW (ref >59)

## 2021-04-23 LAB — CBC
Hematocrit: 37.5 % (ref 34.0–46.6)
Hemoglobin: 12.2 g/dL (ref 11.1–15.9)
MCH: 31.5 pg (ref 26.6–33.0)
MCHC: 32.5 g/dL (ref 31.5–35.7)
MCV: 97 fL (ref 79–97)
Platelets: 700 10*3/uL — ABNORMAL HIGH (ref 150–450)
RBC: 3.87 x10E6/uL (ref 3.77–5.28)
RDW: 15.7 % — ABNORMAL HIGH (ref 11.7–15.4)
WBC: 8.7 10*3/uL (ref 3.4–10.8)

## 2021-04-23 LAB — TSH: TSH: 1.36 u[IU]/mL (ref 0.450–4.500)

## 2021-04-23 LAB — MAGNESIUM: Magnesium: 2.3 mg/dL (ref 1.6–2.3)

## 2021-04-23 NOTE — Telephone Encounter (Signed)
Swinyer, Lanice Schwab, NP  P Cv Div [place] Triage Blood counts are consistent and stable with her history of thrombocythemia.  Creatinine and potassium are elevated. She is not on a potassium supplement but mentioned she drinks several Ensure daily. These may be high in potassium. She should avoid high potassium foods such as potatoes, tomatoes, bananas, orange juice. Change Lasix to 20 mg every other day and monitor daily weight. If weight does not increase > 2 lbs in 1 day or > 5 lbs in a week, she can stay on this schedule. If she has a weight increase, she should take the Lasix daily and let us know. She is not on any other cardiac meds that could worsen kidney function.  We need to recheck in one week.  TSH and magnesium are normal.

## 2021-04-23 NOTE — Telephone Encounter (Signed)
Attempted to contact the patient with results. Telephone number listed for the patient is also the patients son telephone number. Unable to lmtcb, voicemail is not set up.

## 2021-04-24 MED ORDER — FUROSEMIDE 20 MG PO TABS: 20.0000 mg | ORAL_TABLET | ORAL | Status: DC

## 2021-04-24 NOTE — Telephone Encounter (Signed)
Called and spoke with pt's son Venora Maples, [place] approved.  Notified of lab results and provider's recc below.  Venora Maples voiced understanding and does confirm pt had been drinking Ensure supplement drinks.   Venora Maples will make sure that pt does the following:  - Avoid high potassium foods (noted below) including will stop drinking Ensure at this time - Change Lasix to 20 mg every other day - Monitor daily weights and record - Contact our office of weight increase > 2 lbs in 1 day or > 5 lbs in a week and take Lasix 20 mg daily - If weight does not incr as above, will continue Lasix 20 mg every other day - Recheck BMET in 1 week  Pt has been scheduled for BMET 05/01/21 in our office.  Venora Maples has no further questions at this time.

## 2021-04-25 DIAGNOSIS — E875 Hyperkalemia: Secondary | ICD-10-CM | POA: Diagnosis not present

## 2021-04-25 DIAGNOSIS — I5032 Chronic diastolic (congestive) heart failure: Secondary | ICD-10-CM | POA: Diagnosis not present

## 2021-04-25 DIAGNOSIS — R001 Bradycardia, unspecified: Secondary | ICD-10-CM

## 2021-05-01 ENCOUNTER — Other Ambulatory Visit (INDEPENDENT_AMBULATORY_CARE_PROVIDER_SITE_OTHER): Payer: Medicare HMO

## 2021-05-01 ENCOUNTER — Other Ambulatory Visit: Payer: Self-pay

## 2021-05-01 DIAGNOSIS — I5032 Chronic diastolic (congestive) heart failure: Secondary | ICD-10-CM

## 2021-05-01 DIAGNOSIS — Z79899 Other long term (current) drug therapy: Secondary | ICD-10-CM

## 2021-05-01 DIAGNOSIS — E875 Hyperkalemia: Secondary | ICD-10-CM | POA: Diagnosis not present

## 2021-05-02 ENCOUNTER — Encounter: Payer: Self-pay | Admitting: Emergency Medicine

## 2021-05-02 ENCOUNTER — Other Ambulatory Visit: Payer: Self-pay

## 2021-05-02 ENCOUNTER — Emergency Department: Payer: Medicare HMO

## 2021-05-02 ENCOUNTER — Telehealth: Payer: Self-pay | Admitting: Nurse Practitioner

## 2021-05-02 ENCOUNTER — Emergency Department
Admission: EM | Admit: 2021-05-02 | Discharge: 2021-05-02 | Disposition: A | Discharge status: Home / Self Care | Payer: Medicare HMO | Attending: Emergency Medicine | Admitting: Emergency Medicine

## 2021-05-02 DIAGNOSIS — J449 Chronic obstructive pulmonary disease, unspecified: Secondary | ICD-10-CM | POA: Insufficient documentation

## 2021-05-02 DIAGNOSIS — I10 Essential (primary) hypertension: Secondary | ICD-10-CM | POA: Diagnosis not present

## 2021-05-02 DIAGNOSIS — E875 Hyperkalemia: Secondary | ICD-10-CM | POA: Diagnosis not present

## 2021-05-02 DIAGNOSIS — N3289 Other specified disorders of bladder: Secondary | ICD-10-CM | POA: Diagnosis not present

## 2021-05-02 DIAGNOSIS — N390 Urinary tract infection, site not specified: Secondary | ICD-10-CM | POA: Diagnosis not present

## 2021-05-02 DIAGNOSIS — R109 Unspecified abdominal pain: Secondary | ICD-10-CM

## 2021-05-02 DIAGNOSIS — D3500 Benign neoplasm of unspecified adrenal gland: Secondary | ICD-10-CM | POA: Diagnosis not present

## 2021-05-02 DIAGNOSIS — N289 Disorder of kidney and ureter, unspecified: Secondary | ICD-10-CM | POA: Diagnosis not present

## 2021-05-02 DIAGNOSIS — R319 Hematuria, unspecified: Secondary | ICD-10-CM

## 2021-05-02 DIAGNOSIS — R001 Bradycardia, unspecified: Secondary | ICD-10-CM | POA: Diagnosis not present

## 2021-05-02 DIAGNOSIS — N132 Hydronephrosis with renal and ureteral calculous obstruction: Secondary | ICD-10-CM | POA: Diagnosis not present

## 2021-05-02 LAB — URINALYSIS, ROUTINE W REFLEX MICROSCOPIC
Bacteria, UA: NONE SEEN
Bilirubin Urine: NEGATIVE
Glucose, UA: NEGATIVE mg/dL
Ketones, ur: NEGATIVE mg/dL
Nitrite: NEGATIVE
Protein, ur: NEGATIVE mg/dL
RBC / HPF: >50 RBC/hpf — ABNORMAL HIGH (ref 0–5)
Specific Gravity, Urine: 1.017 (ref 1.005–1.030)
pH: 5 (ref 5.0–8.0)

## 2021-05-02 LAB — CBC
HCT: 33.5 % — ABNORMAL LOW (ref 36.0–46.0)
Hemoglobin: 11.1 g/dL — ABNORMAL LOW (ref 12.0–15.0)
MCH: 31.5 pg (ref 26.0–34.0)
MCHC: 33.1 g/dL (ref 30.0–36.0)
MCV: 95.2 fL (ref 80.0–100.0)
Platelets: 606 10*3/uL — ABNORMAL HIGH (ref 150–400)
RBC: 3.52 MIL/uL — ABNORMAL LOW (ref 3.87–5.11)
RDW: 16.9 % — ABNORMAL HIGH (ref 11.5–15.5)
WBC: 7.8 10*3/uL (ref 4.0–10.5)
nRBC: 0 % (ref 0.0–0.2)

## 2021-05-02 LAB — COMPREHENSIVE METABOLIC PANEL
ALT: 13 U/L (ref 0–44)
AST: 15 U/L (ref 15–41)
Albumin: 4.1 g/dL (ref 3.5–5.0)
Alkaline Phosphatase: 67 U/L (ref 38–126)
Anion gap: 6 (ref 5–15)
BUN: 29 mg/dL — ABNORMAL HIGH (ref 8–23)
CO2: 27 mmol/L (ref 22–32)
Calcium: 9.4 mg/dL (ref 8.9–10.3)
Chloride: 104 mmol/L (ref 98–111)
Creatinine, Ser: 1.82 mg/dL — ABNORMAL HIGH (ref 0.44–1.00)
GFR, Estimated: 28 mL/min — ABNORMAL LOW (ref >60)
Glucose, Bld: 93 mg/dL (ref 70–99)
Potassium: 4.6 mmol/L (ref 3.5–5.1)
Sodium: 137 mmol/L (ref 135–145)
Total Bilirubin: 0.8 mg/dL (ref 0.3–1.2)
Total Protein: 6.7 g/dL (ref 6.5–8.1)

## 2021-05-02 LAB — BASIC METABOLIC PANEL
BUN/Creatinine Ratio: 12 (ref 12–28)
BUN: 24 mg/dL (ref 8–27)
CO2: 23 mmol/L (ref 20–29)
Calcium: 9.6 mg/dL (ref 8.7–10.3)
Chloride: 102 mmol/L (ref 96–106)
Creatinine, Ser: 1.98 mg/dL — ABNORMAL HIGH (ref 0.57–1.00)
Glucose: 91 mg/dL (ref 70–99)
Potassium: 5.7 mmol/L — ABNORMAL HIGH (ref 3.5–5.2)
Sodium: 141 mmol/L (ref 134–144)
eGFR: 26 mL/min/{1.73_m2} — ABNORMAL LOW (ref >59)

## 2021-05-02 LAB — LIPASE, BLOOD: Lipase: 48 U/L (ref 11–51)

## 2021-05-02 MED ORDER — TRAMADOL HCL 50 MG PO TABS
50.0000 mg | ORAL_TABLET | Freq: Four times a day (QID) | ORAL | 0 refills | Status: AC | PRN
Start: 2021-05-02 — End: 2022-05-02

## 2021-05-02 MED ORDER — CEPHALEXIN 500 MG PO CAPS
500.0000 mg | ORAL_CAPSULE | Freq: Four times a day (QID) | ORAL | 0 refills | Status: AC
Start: 2021-05-02 — End: 2021-05-12

## 2021-05-02 MED ORDER — SODIUM CHLORIDE 0.9 % IV SOLN
1.0000 g | Freq: Once | INTRAVENOUS | Status: AC
  Administered 2021-05-02: 1 g via INTRAVENOUS
  Filled 2021-05-02: qty 10

## 2021-05-02 MED ORDER — TRAMADOL HCL 50 MG PO TABS
50.0000 mg | ORAL_TABLET | Freq: Once | ORAL | Status: AC
  Administered 2021-05-02: 50 mg via ORAL
  Filled 2021-05-02: qty 1

## 2021-05-02 NOTE — ED Triage Notes (Signed)
Had blood work drawn yesterday, referred to ED due to K:  5.7 and CR elevated.    Patient also c/o pain all over and bleeding for "I don't know where"  AAOx3.  Skin warm and dry. NAD

## 2021-05-02 NOTE — ED Provider Notes (Signed)
[facility] Provider Note    Event Date/Time   First MD Initiated Contact with Patient 05/02/21 2006     (approximate)   History   hyperkalemia   HPI  Pamela James is a 76 y.o. female  who, per cardiology office note dated 04/22/2021 has history of htn, COPD, heart failure, presents to the emergency department today at the request of outside provider given blood work that showed elevated potassium and creatinine. The patient herself is complaining of noticing hematuria and suprapubic and bilateral flank pain. She first noticed the hematuria earlier this month and then it resolved before starting again over the past couple of days. The pain is significant. Her son had tried to alter her diet to decrease her potassium intake over the last day.     Physical Exam   Triage Vital Signs: ED Triage Vitals [05/02/21 1735]  Enc Vitals Group     BP (!) 189/52     Pulse Rate 61     Resp 16     Temp 98.1 F (36.7 C)     Temp Source Oral     SpO2 99 %     Weight 169 lb 12.1 oz (77 kg)     Height 5\' 3"  (1.6 m)     Head Circumference      Peak Flow      Pain Score 7     Pain Loc      Pain Edu?      Excl. in [place]?     Most recent vital signs: Vitals:   05/02/21 1735  BP: (!) 189/52  Pulse: 61  Resp: 16  Temp: 98.1 F (36.7 C)  SpO2: 99%    General: Awake, no distress.  CV:  Good peripheral perfusion.  Resp:  Normal effort.  Abd:  No distention. Suprapubic tenderness.  ED Results / Procedures / Treatments   Labs (all labs ordered are listed, but only abnormal results are displayed) Labs Reviewed  COMPREHENSIVE METABOLIC PANEL - Abnormal; Notable for the following components:      Result Value   BUN 29 (*)    Creatinine, Ser 1.82 (*)    GFR, Estimated 28 (*)    All other components within normal limits  CBC - Abnormal; Notable for the following components:   RBC 3.52 (*)    Hemoglobin 11.1 (*)    HCT 33.5 (*)    RDW 16.9 (*)    Platelets 606  (*)    All other components within normal limits  URINALYSIS, ROUTINE W REFLEX MICROSCOPIC - Abnormal; Notable for the following components:   Color, Urine YELLOW (*)    APPearance HAZY (*)    Hgb urine dipstick LARGE (*)    Leukocytes,Ua TRACE (*)    RBC / HPF >50 (*)    All other components within normal limits  LIPASE, BLOOD     EKG  I, Nance Pear, attending physician, personally viewed and interpreted this EKG  EKG Time: 1743 Rate: 57 Rhythm: junctional bradycardia Axis: normal Intervals: qtc 395 QRS: narrow ST changes: no st elevation Impression: abnormal ekg, junctional rhythm present in ekg dated 04/22/21    RADIOLOGY CT renal stone I independently interpreted and visualized the ct renal stone study. My interpretation: left hydronephrosis. No free air. Radiology interpretation: IMPRESSION:  1. Stable moderate to severe left hydroureteronephrosis down to the  middle third of the ureter where high attenuation material fills the  lumen with abrupt tapering to normal ureteral  caliber in the distal  third. This could be blood products or soft tissue/neoplasm. Overall  appearance is similar to prior study.  2. Nonobstructing right renal stones.  3. Stable 2.2 cm left adrenal nodule with attenuation too high to  allow classification as an adenoma.  4. Subtle tiny clustered nodularity posterior right lower lobe  likely infectious/inflammatory.  5. Aortic Atherosclerosis (ICD10-I70.0).        PROCEDURES:  Critical Care performed: No  Procedures   MEDICATIONS ORDERED IN ED: Medications - No data to display   IMPRESSION / MDM / [place] / ED COURSE  I reviewed the triage vital signs and the nursing notes.                              Differential diagnosis includes, but is not limited to, kidney stone, pyelonephritis, UTI, neoplasm.  Patient presented to the emergency department today because of concerns for elevated potassium found on  outpatient lab work.  Patient's blood work today with normal potassium.  Creatinine is slightly elevated however not significantly elevated over baseline.  Patient's primary complaint however is for hematuria and flank pain.  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  Has had history of hematuria in the past and known hydronephrosis on that side.  Urine today does show blood and few white blood cell counts.  Trace leukocytes.  No elevated leukocytosis and blood work.  I did repeat CT scan today which is consistent with known disease process.  Discussed briefly with Dr. Junious Silk with urology on the telephone.  This point felt outpatient follow-up would be appropriate.  Discussed this with the patient.  The patient's pain did improve significantly with tramadol.  While I considered admission given improvement of pain and blood work without any concerning leukocytosis, hyperkalemia and only minimally elevated creatinine over baseline I think is reasonable for patient to be discharged home.  Discussed this with patient.  Encourage follow-up with her urologist.   FINAL CLINICAL IMPRESSION(S) / ED DIAGNOSES   Final diagnoses:  Lower urinary tract infection  Flank pain  Hematuria, unspecified type     Rx / DC Orders   ED Discharge Orders          Ordered    traMADol (ULTRAM) 50 MG tablet  Every 6 hours PRN        05/02/21 2335    cephALEXin (KEFLEX) 500 MG capsule  4 times daily        05/02/21 2335             Note:  This document was prepared using Dragon voice recognition software and may include unintentional dictation errors.    Nance Pear, MD 05/02/21 [phone]

## 2021-05-02 NOTE — ED Provider Triage Note (Signed)
Emergency Medicine Provider Triage Evaluation Note  Pamela James, a 76 y.o. female  was evaluated in triage.  Pt complains of hyperkalemia.  Patient is referred to the ED by her cardiologist, due to elevated levels on blood work drawn yesterday.  Patient with a history of hypertension, AKI, and CHF, presents with elevated potassium.  She also reports generalized pain and some bleeding with urination.  Patient is unclear of the source of the blood, noting that she had a previous hysterectomy.  She denies any was, chills, or sweats.  Review of Systems  Positive: Hyperkalemia, hematuria, generalized pain Negative: FCS  Physical Exam  BP (!) 189/52 (BP Location: Left Arm)    Pulse 61    Temp 98.1 F (36.7 C) (Oral)    Resp 16    Ht 5\' 3"  (1.6 m)    Wt 77 kg    SpO2 99%    BMI 30.07 kg/m  Gen:   Awake, no distress   Resp:  Normal effort  MSK:   Moves extremities without difficulty  Other:  CVS: RRR  Medical Decision Making  Medically screening exam initiated at 5:41 PM.  Appropriate orders placed.  Pamela James was informed that the remainder of the evaluation will be completed by another provider, this initial triage assessment does not replace that evaluation, and the importance of remaining in the ED until their evaluation is complete.  Patient to the ED for evaluation of hyperkalemia and hematuria.   Melvenia Needles, PA-C 05/02/21 1743

## 2021-05-02 NOTE — ED Notes (Signed)
Patient ambulated to room commode with a steady gait. Patient voided "a good flow". A hat was not placed in the toilet in time.

## 2021-05-02 NOTE — Telephone Encounter (Signed)
Emmaline Life, NP  05/02/2021  8:04 AM EST     Due to her history of junctional bradycardia she will need to go to the ED due to worsening renal function and elevated potassium.

## 2021-05-02 NOTE — ED Notes (Signed)
Report given to Julie RN.

## 2021-05-02 NOTE — ED Notes (Signed)
Patient taken to CT scan. Family left bedside at this time.

## 2021-05-02 NOTE — Telephone Encounter (Signed)
I spoke with the patient's son, Venora Maples, regarding the patient's lab results (ok per DPR).  He is aware of Christen Bame, NP's recommendations that in light of the patient's worsening renal function and increased potassium and with her history of heart arrhythmias, she will need to report to the ED for further evaluation and possible treatment.   Per Venora Maples, he has eliminated any extra sources of dietary potassium. He also advised the patient had some pain in her kidneys and blood in her urine about a week ago. He contacted her PCP who wanted her to come for a urine test, but he thought we would be checking that yesterday and decided to forgo taking the patient last week so he would not have to miss so much work.  I advised Venora Maples, that unfortunately the BMP and the urinalysis are 2 different test and look at different things, so there is a possibility that the patient may still have an underlying UTI. I have also advised him that otherwise, he was doing all the right things to try to help her abnormal potassium levels, but despite that, we are still seeing this increase and need to sort out why.  Venora Maples is advised this is most safely done in the hospital setting. He voices understanding and is agreeable with taking the patient to the ED.

## 2021-05-02 NOTE — Discharge Instructions (Signed)
Please seek medical attention for any high fevers, chest pain, shortness of breath, change in behavior, persistent vomiting, bloody stool or any other new or concerning symptoms.  

## 2021-05-05 ENCOUNTER — Other Ambulatory Visit: Payer: Medicare HMO

## 2021-05-06 DIAGNOSIS — R102 Pelvic and perineal pain: Secondary | ICD-10-CM | POA: Diagnosis not present

## 2021-05-06 DIAGNOSIS — R310 Gross hematuria: Secondary | ICD-10-CM | POA: Diagnosis not present

## 2021-05-06 DIAGNOSIS — C689 Malignant neoplasm of urinary organ, unspecified: Secondary | ICD-10-CM | POA: Diagnosis not present

## 2021-05-13 DIAGNOSIS — I5032 Chronic diastolic (congestive) heart failure: Secondary | ICD-10-CM | POA: Diagnosis not present

## 2021-05-13 DIAGNOSIS — R001 Bradycardia, unspecified: Secondary | ICD-10-CM | POA: Diagnosis not present

## 2021-05-19 ENCOUNTER — Telehealth: Payer: Self-pay | Admitting: Nurse Practitioner

## 2021-05-19 NOTE — Telephone Encounter (Signed)
DPR on file. ?Attempted to contact the patients son Venora Maples with results. Unable to lmom. Pt sons voicemail is not set up. ?Only telephone number listed for the patient. ?

## 2021-05-20 NOTE — Telephone Encounter (Signed)
Reviewed results and recommendations with patients son per release form. Confirmed upcoming appointment as well. He verbalized understanding with no further questions at this time.  ?

## 2021-05-21 ENCOUNTER — Ambulatory Visit (INDEPENDENT_AMBULATORY_CARE_PROVIDER_SITE_OTHER): Payer: Medicare HMO | Admitting: Urology

## 2021-05-21 ENCOUNTER — Encounter: Payer: Self-pay | Admitting: Urology

## 2021-05-21 ENCOUNTER — Other Ambulatory Visit: Payer: Self-pay

## 2021-05-21 VITALS — BP 138/74 | HR 72 | Ht 62.0 in | Wt 167.0 lb

## 2021-05-21 DIAGNOSIS — N1330 Unspecified hydronephrosis: Secondary | ICD-10-CM

## 2021-05-21 DIAGNOSIS — N390 Urinary tract infection, site not specified: Secondary | ICD-10-CM

## 2021-05-21 DIAGNOSIS — C689 Malignant neoplasm of urinary organ, unspecified: Secondary | ICD-10-CM

## 2021-05-21 LAB — URINALYSIS, COMPLETE
Bilirubin, UA: NEGATIVE
Glucose, UA: NEGATIVE
Ketones, UA: NEGATIVE
Leukocytes,UA: NEGATIVE
Nitrite, UA: NEGATIVE
Protein,UA: NEGATIVE
RBC, UA: NEGATIVE
Specific Gravity, UA: 1.015 (ref 1.005–1.030)
Urobilinogen, Ur: 1 mg/dL (ref 0.2–1.0)
pH, UA: 5.5 (ref 5.0–7.5)

## 2021-05-21 LAB — MICROSCOPIC EXAMINATION

## 2021-05-21 MED ORDER — GEMTESA 75 MG PO TABS: 75.0000 mg | ORAL_TABLET | Freq: Every day | ORAL | 0 refills | Status: DC

## 2021-05-21 NOTE — Progress Notes (Signed)
Cardiology Office Note  Date:  05/23/2021   ID:  LEVY WELLMAN, DOB 1945-09-15, MRN [id number]  PCP:  Tracie Harrier, MD   Chief Complaint  Patient presents with   1 month follow up     Patient c/o shortness of breath with exertion. Medications reviewed by the patient verbally.     HPI:  Pamela James is a 76 y.o. female with a hx of  HTN,  aortic atherosclerosis,  COPD,  hyperlipidemia,  MGUA and thrombocytosis.  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 Intubation with admission to ICU was complicated by aspiration pneumonia, pulmonary edema requiring IV Lasix, left subclavian clot (started on DOAC), hypertensive urgency, questionable alcohol withdrawal, metabolic encephalopathy, and bradycardia/hypotension.  Who presents for HTN  New to me in the office today Son presents with her today, reports that she is very Sedentary On computer, TV, social media, movies watching 65 inch TV Leg weakness, no falls but getting more deconditioned in the past 2 years Otherwise she reports she is fine, no leg swelling no chest pain no significant shortness of breath General deconditioning  Lab work reviewed CR 1.6, down from 2.0 Total chol 139, LDL 61  EKG personally reviewed by myself on todays visit Normal sinus rhythm rate 81 bpm no significant ST-T wave changes  Event monitor reviewed Normal sinus rhythm Patient had a min HR of 41 bpm, max HR of 143 bpm, and avg HR of 58 bpm. .  17 Supraventricular Tachycardia/atrial tachycardia runs occurred, the run with the fastest interval lasting 11 beats with a max rate of 143 bpm, the longest lasting 17 beats with an avg rate of 117 bpm.  Isolated SVEs were occasional (1.8%, 10228), SVE Couplets were  rare (<1.0%, 316), and SVE Triplets were rare (<1.0%, 69). Isolated VEs were rare (<1.0%), and no VE Couplets or VE Triplets were present. -Patient triggered events noted associated with normal sinus rhythm, rare  PAC  Echo reviewed  1. Left ventricular ejection fraction, by estimation, is 60 to 65%. The  left ventricle has normal function. The left ventricle has no regional  wall motion abnormalities. There is moderate left ventricular hypertrophy.  Left ventricular diastolic  parameters are consistent with Grade II diastolic dysfunction  (pseudonormalization). The average left ventricular global longitudinal  strain is -14.7 %.   2. Right ventricular systolic function is normal. The right ventricular  size is normal. Tricuspid regurgitation signal is inadequate for assessing  PA pressure.   3. The mitral valve is normal in structure. Mild to moderate mitral valve  regurgitation. No evidence of mitral stenosis.   4. The aortic valve was not well visualized. Aortic valve regurgitation  is mild. No aortic stenosis is present.   Other past medical history reviewed Ultrasound 09/16/19 revealed LUE positive for DVT of subclavian vein, axillary vein, and paired brachial veins. Occlusive of axillary paired brachial veins, and nonocclusive at subclavian vein. Superficial occlusive thrombophlebitis of cephalic vein, basilic vein, radial vein, and ulnar vein. Following extubation and transfer from ICU, she quickly developed hypertensive urgency and was transferred back to ICU for nicardipine gtt.    ED 11/09/19 found to be hypoxic, volume overloaded, and hyperkalemic with ARF and severe left hydronephrosis. Intermittent bradycardia was noted with atropine administration, junctional rhythm at 46 bpm noted and thought to be due to electrolyte abnormality (hyponatremia, hyperkalemia).   resolution of junctional rhythm with improved electrolytes.  LVEF 60-65%, mild LVH. She followed up with HF clinic  on 11/23/19 with minimal DOE with palpitations.  Eliquis taken for subclavian artery thrombosis.    PMH:   has a past medical history of Anxiety, Asthma, CHF (congestive heart failure) ([place]), COPD (chronic obstructive  pulmonary disease) ([place]), Depression, GERD (gastroesophageal reflux disease), and Hypertension.  PSH:    Past Surgical History:  Procedure Laterality Date   ABDOMINAL HYSTERECTOMY     TOTAL HIP ARTHROPLASTY Left    TUBAL LIGATION      Current Outpatient Medications  Medication Sig Dispense Refill   albuterol (PROVENTIL HFA) 108 (90 Base) MCG/ACT inhaler Inhale 2 puffs into the lungs every 4 (four) hours as needed for wheezing or shortness of breath. 1 each 0   albuterol (PROVENTIL) (2.5 MG/3ML) 0.083% nebulizer solution Take 2.5 mg by nebulization every 6 (six) hours as needed for wheezing or shortness of breath.     alendronate (FOSAMAX) 70 MG tablet Take 70 mg by mouth once a week. Take with a full glass of water on an empty stomach.     ALPRAZolam (XANAX) 0.25 MG tablet Take 0.25 mg by mouth daily as needed.     amLODipine (NORVASC) 5 MG tablet Take 5 mg by mouth daily.     bisacodyl (DULCOLAX) 5 MG EC tablet Take 5 mg by mouth daily as needed.     citalopram (CELEXA) 10 MG tablet Take 10 mg by mouth daily.      Docusate Sodium (DSS) 100 MG CAPS Take by mouth daily as needed.      Ensure (ENSURE) Take 237 mLs by mouth 2 (two) times daily between meals.     estradiol (ESTRACE) 0.1 MG/GM vaginal cream Place vaginally.     furosemide (LASIX) 20 MG tablet Take 1 tablet (20 mg total) by mouth every other day.     hydrALAZINE (APRESOLINE) 25 MG tablet Take 25 mg by mouth in the morning and at bedtime.     ipratropium (ATROVENT) 0.02 % nebulizer solution Take 2.5 mLs (0.5 mg total) by nebulization every 6 (six) hours as needed for wheezing or shortness of breath. 75 mL 0   memantine (NAMENDA) 5 MG tablet Take 1 tablet by mouth 2 (two) times daily.     nitroGLYCERIN (NITROSTAT) 0.4 MG SL tablet Place 1 tablet (0.4 mg total) under the tongue every 5 (five) minutes as needed for chest pain. 30 tablet 0   omeprazole (PRILOSEC) 20 MG capsule Take 20 mg by mouth daily.     pravastatin (PRAVACHOL)  20 MG tablet Take 20 mg by mouth at bedtime.      tamsulosin (FLOMAX) 0.4 MG CAPS capsule Take 0.4 mg by mouth daily.     traMADol (ULTRAM) 50 MG tablet Take 50 mg by mouth at bedtime.     traZODone (DESYREL) 150 MG tablet Take 150 mg by mouth at bedtime.     Vibegron (GEMTESA) 75 MG TABS Take 75 mg by mouth daily. 30 tablet 0   vitamin B-12 (CYANOCOBALAMIN) 1000 MCG tablet Take 1,000 mcg by mouth daily.     apixaban (ELIQUIS) 5 MG TABS tablet Take 1 tablet (5 mg total) by mouth 2 (two) times daily. (Patient not taking: Reported on 05/23/2021) 60 tablet 1   ferrous sulfate 325 (65 FE) MG EC tablet Take 1 tablet (325 mg total) by mouth 2 (two) times daily. (Patient not taking: Reported on 05/23/2021) 180 tablet 1   predniSONE (DELTASONE) 50 MG tablet Take 50 mg by mouth daily as needed. (Patient not taking: Reported on 05/23/2021)  traMADol (ULTRAM) 50 MG tablet Take 1 tablet (50 mg total) by mouth every 6 (six) hours as needed. (Patient not taking: Reported on 05/23/2021) 15 tablet 0   No current facility-administered medications for this visit.     Allergies:   Morphine and related, Nsaids, and Aspirin   Social History:  The patient  reports that she quit smoking about 18 months ago. Her smoking use included cigarettes. She has a 30.00 pack-year smoking history. She has never used smokeless tobacco. She reports that she does not drink alcohol and does not use drugs.   Family History:   family history includes Cancer in her brother; Heart disease in her father and sister.    Review of Systems: Review of Systems  Constitutional: Negative.   HENT: Negative.    Respiratory: Negative.    Cardiovascular: Negative.   Gastrointestinal: Negative.   Musculoskeletal: Negative.   Neurological: Negative.   Psychiatric/Behavioral: Negative.    All other systems reviewed and are negative.   PHYSICAL EXAM: VS:  BP 138/64 (BP Location: Left Arm, Patient Position: Sitting, Cuff Size: Normal)     Pulse 81    Ht 5' 2.5" (1.588 m)    Wt 168 lb 6 oz (76.4 kg)    SpO2 96%    BMI 30.31 kg/m  , BMI Body mass index is 30.31 kg/m. Constitutional:  oriented to person, place, and time. No distress.  HENT:  Head: Grossly normal Eyes:  no discharge. No scleral icterus.  Neck: No JVD, no carotid bruits  Cardiovascular: Regular rate and rhythm, no murmurs appreciated Pulmonary/Chest: Clear to auscultation bilaterally, no wheezes or rails Abdominal: Soft.  no distension.  no tenderness.  Musculoskeletal: Normal range of motion Neurological:  normal muscle tone. Coordination normal. No atrophy Skin: Skin warm and dry Psychiatric: normal affect, pleasant   Recent Labs: 04/22/2021: Magnesium 2.3; TSH 1.360 05/02/2021: ALT 13; Hemoglobin 11.1; Platelets 606 05/21/2021: BUN 16; Creatinine, Ser 1.36; Potassium 5.7; Sodium 140    Lipid Panel Lab Results  Component Value Date   CHOL 139 03/25/2016   HDL 46 03/25/2016   LDLCALC 76 03/25/2016   TRIG 114 09/18/2019      Wt Readings from Last 3 Encounters:  05/23/21 168 lb 6 oz (76.4 kg)  05/21/21 167 lb (75.8 kg)  05/02/21 169 lb 12.1 oz (77 kg)     ASSESSMENT AND PLAN:  Problem List Items Addressed This Visit       Cardiology Problems   Junctional bradycardia   Essential hypertension   Other Visit Diagnoses     Chronic diastolic congestive heart failure (HCC)    -  Primary   Bradycardia       Chronic combined systolic and diastolic CHF, NYHA class 2 (HCC)       Hyperlipidemia LDL goal <70       Aortic atherosclerosis (HCC)       Subclavian artery occlusive syndrome          Junctional bradycardia Appears to have resolved, normal sinus rhythm today with good rate, asymptomatic Monitor reviewed with her Rare short asymptomatic episodes narrow complex tachycardia  diastolic CHF, chronic Normal ejection fraction on echo Appears euvolemic, takes Lasix every other day, continue current medication regiment  Aortic  atherosclerosis Cholesterol at goal, recommend no smoking  Hyperlipidemia Continue pravastatin   Total encounter time more than 30 minutes  Greater than 50% was spent in counseling and coordination of care with the patient    Signed, Esmond Plants, M.D.,  Ph.D. [facility] [place], [place] [phone]

## 2021-05-22 LAB — BASIC METABOLIC PANEL
BUN/Creatinine Ratio: 12 (ref 12–28)
BUN: 16 mg/dL (ref 8–27)
CO2: 23 mmol/L (ref 20–29)
Calcium: 10.3 mg/dL (ref 8.7–10.3)
Chloride: 104 mmol/L (ref 96–106)
Creatinine, Ser: 1.36 mg/dL — ABNORMAL HIGH (ref 0.57–1.00)
Glucose: 80 mg/dL (ref 70–99)
Potassium: 5.7 mmol/L — ABNORMAL HIGH (ref 3.5–5.2)
Sodium: 140 mmol/L (ref 134–144)
eGFR: 40 mL/min/{1.73_m2} — ABNORMAL LOW (ref >59)

## 2021-05-22 NOTE — Progress Notes (Unsigned)
05/21/2021 8:16 AM   [name] 03-Dec-1945 [id number]  Primary provider: Tracie Harrier, MD [address] [facility],  [place] [address]  Chief Complaint  Patient presents with   Recurrent UTI    HPI: Pamela James is a 76 y.o. female who presents with complaints of left flank/abdominal pain, gross hematuria and storage elated voiding symptoms.  Established patient [facility] and patient's son states they have been unable to get in for an appointment [facility] records or reviewed on Care Everywhere Previous history gross hematuria and left hydronephrosis with CT 05/2019 showing moderate to severe left hydronephrosis with delayed nephrogram Left ureteroscopy 08/10/2019 with findings of a 4 cm area of hypertrophic tissue in the left mid ureter felt consistent with tumor.  She underwent biopsy with stent placement.  Pathology was inconclusive Follow-up ureteroscopy 09/12/2019 with similar findings with hypertrophic tissue measuring ~ 5 cm in length.  Multiple biopsies were taken with cytology and brushings.  Stent was replaced.  Cytology and brushings were inconclusive.  Pathology returned low-grade, noninvasive papillary urothelial carcinoma Admitted [facility] 09/15/2019 with respiratory failure felt secondary to aspiration.  She required intubation.  Also treated for CHF/pulmonary edema, aspiration pneumonia and left subclavian DVT and presumed NSTEMI.  She was having severe stent related symptoms and her stent was removed 10/04/2019 She had a follow-up 01/15/2020.  A Lasix renogram 11/02/2019 showed 40% function of left kidney and severe left hydronephrosis without evidence of obstruction Conservative management was elected due to multiple medical comorbidities and annual follow-up was recommended She was last seen at [facility] 01/03/2021 with the recommendations of a BMP and continue annual follow-up with renal function monitoring.   Has had intermittent gross hematuria since  early February 2022.  Complains of frequency, urgency with pelvic and bilateral flank pain Was sent to ED by her cardiologist 05/02/2021 for creatinine 1.98 Noncontrast CT performed which showed moderate to severe left hydronephrosis and hydroureter to the mid ureter with high attenuation material noted in this region.  Urinalysis showed >50 RBC/21-50 WBCs.  She was discharged with recommendations of outpatient follow-up Follow-up creatinine 05/06/2021 was 1.6   PMH: Past Medical History:  Diagnosis Date   Anxiety    Asthma    CHF (congestive heart failure) (HCC)    COPD (chronic obstructive pulmonary disease) (HCC)    Depression    GERD (gastroesophageal reflux disease)    Hypertension     Surgical History: Past Surgical History:  Procedure Laterality Date   ABDOMINAL HYSTERECTOMY     TOTAL HIP ARTHROPLASTY Left    TUBAL LIGATION      Home Medications:  Allergies as of 05/21/2021       Reactions   Morphine And Related Other (See Comments)   Delerium, itching   Nsaids Other (See Comments)   GI ulcers   Aspirin Other (See Comments)   GI ulcers Can tolerate 81 mg daily        Medication List        Accurate as of May 21, 2021 11:59 PM. If you have any questions, ask your nurse or doctor.          albuterol (2.5 MG/3ML) 0.083% nebulizer solution Commonly known as: PROVENTIL Take 2.5 mg by nebulization every 6 (six) hours as needed for wheezing or shortness of breath.   albuterol 108 (90 Base) MCG/ACT inhaler Commonly known as: Proventil HFA Inhale 2 puffs into the lungs every 4 (four) hours as needed for wheezing or shortness of  breath.   alendronate 70 MG tablet Commonly known as: FOSAMAX Take 70 mg by mouth once a week. Take with a full glass of water on an empty stomach.   ALPRAZolam 0.25 MG tablet Commonly known as: XANAX Take 0.25 mg by mouth daily as needed.   amLODipine 5 MG tablet Commonly known as: NORVASC Take 5 mg by mouth daily.   apixaban 5  MG Tabs tablet Commonly known as: ELIQUIS Take 1 tablet (5 mg total) by mouth 2 (two) times daily.   bisacodyl 5 MG EC tablet Commonly known as: DULCOLAX Take 5 mg by mouth daily as needed.   citalopram 10 MG tablet Commonly known as: CELEXA Take 10 mg by mouth daily.   DSS 100 MG Caps Take by mouth daily as needed.   Ensure Take 237 mLs by mouth 2 (two) times daily between meals.   estradiol 0.1 MG/GM vaginal cream Commonly known as: ESTRACE Place vaginally.   ferrous sulfate 325 (65 FE) MG EC tablet Take 1 tablet (325 mg total) by mouth 2 (two) times daily.   furosemide 20 MG tablet Commonly known as: Lasix Take 1 tablet (20 mg total) by mouth every other day.   Gemtesa 75 MG Tabs Generic drug: Vibegron Take 75 mg by mouth daily. Started by: Abbie Sons, MD   hydrALAZINE 25 MG tablet Commonly known as: APRESOLINE Take 25 mg by mouth in the morning and at bedtime.   ipratropium 0.02 % nebulizer solution Commonly known as: ATROVENT Take 2.5 mLs (0.5 mg total) by nebulization every 6 (six) hours as needed for wheezing or shortness of breath.   memantine 5 MG tablet Commonly known as: NAMENDA Take 1 tablet by mouth 2 (two) times daily.   nitroGLYCERIN 0.4 MG SL tablet Commonly known as: NITROSTAT Place 1 tablet (0.4 mg total) under the tongue every 5 (five) minutes as needed for chest pain.   omeprazole 20 MG capsule Commonly known as: PRILOSEC Take 20 mg by mouth daily.   pravastatin 20 MG tablet Commonly known as: PRAVACHOL Take 20 mg by mouth at bedtime.   predniSONE 50 MG tablet Commonly known as: DELTASONE Take 50 mg by mouth daily as needed.   tamsulosin 0.4 MG Caps capsule Commonly known as: FLOMAX Take 0.4 mg by mouth daily.   traMADol 50 MG tablet Commonly known as: ULTRAM Take 50 mg by mouth at bedtime.   traMADol 50 MG tablet Commonly known as: Ultram Take 1 tablet (50 mg total) by mouth every 6 (six) hours as needed.   traZODone  150 MG tablet Commonly known as: DESYREL Take 150 mg by mouth at bedtime.   vitamin B-12 1000 MCG tablet Commonly known as: CYANOCOBALAMIN Take 1,000 mcg by mouth daily.        Allergies:  Allergies  Allergen Reactions   Morphine And Related Other (See Comments)    Delerium, itching   Nsaids Other (See Comments)    GI ulcers   Aspirin Other (See Comments)    GI ulcers Can tolerate 81 mg daily    Family History: Family History  Problem Relation Age of Onset   Heart disease Father    Heart disease Sister    Cancer Brother     Social History:  reports that she quit smoking about 18 months ago. Her smoking use included cigarettes. She has a 30.00 pack-year smoking history. She has never used smokeless tobacco. She reports that she does not drink alcohol and does not use drugs.  Physical Exam: BP 138/74    Pulse 72    Ht '5\' 2"'$  (1.575 m)    Wt 167 lb (75.8 kg)    BMI 30.54 kg/m   Constitutional:  Alert  HEENT: [place] AT, moist mucus membranes.  Trachea midline, no masses. Cardiovascular: No clubbing, cyanosis, or edema. Respiratory: Normal respiratory effort, no increased work of breathing.    Pertinent Imaging: CT images 05/02/2021 were personally reviewed and interpreted    CT Renal Stone Study  Narrative CLINICAL DATA:  Hematuria.  Pain all over.  Flank pain bleeding.  EXAM: CT ABDOMEN AND PELVIS WITHOUT CONTRAST  TECHNIQUE: Multidetector CT imaging of the abdomen and pelvis was performed following the standard protocol without IV contrast.  RADIATION DOSE REDUCTION: This exam was performed according to the departmental dose-optimization program which includes automated exposure control, adjustment of the mA and/or kV according to patient size and/or use of iterative reconstruction technique.  COMPARISON:  11/09/2019  FINDINGS: Lower chest: Subtle tiny clustered nodularity posterior right lower lobe likely infectious/inflammatory (image 3/series  4).  Hepatobiliary: No suspicious focal abnormality in the liver on this study without intravenous contrast. There is no evidence for gallstones, gallbladder wall thickening, or pericholecystic fluid. No intrahepatic or extrahepatic biliary dilation.  Pancreas: No focal mass lesion. No dilatation of the main duct. No intraparenchymal cyst. No peripancreatic edema.  Spleen: No splenomegaly. No focal mass lesion.  Adrenals/Urinary Tract: Stable 2.2 cm left adrenal nodule with attenuation too high to allow classification as an adenoma. Right adrenal gland unremarkable.  Cortical scarring identified upper pole right kidney with 5 mm nonobstructing stone in the upper pole right kidney and additional 2 and 1 mm nonobstructing stones in the interpolar region and lower pole, respectively. Stable 16 mm exophytic low-density lesion lower pole right kidney, likely a cyst.  The moderate to severe left hydroureteronephrosis is stable. Left ureter is dilated down to the middle third where the lumen is filled with high attenuation material which could be blood products or soft tissue/neoplasm (see axial 46/2 and well demonstrated on coronal 59/5 and sagittal 100/6). Distal third of the left ureter is nondilated. Bladder is moderately distended and mostly obscured by beam hardening artifact from left hip replacement.  Stomach/Bowel: Stomach is unremarkable. No gastric wall thickening. No evidence of outlet obstruction. Duodenum is normally positioned as is the ligament of Treitz. No small bowel wall thickening. No small bowel dilatation. The terminal ileum is normal. The appendix is not well visualized, but there is no edema or inflammation in the region of the cecum. No gross colonic mass. No colonic wall thickening.  Vascular/Lymphatic: There is moderate atherosclerotic calcification of the abdominal aorta without aneurysm. There is no gastrohepatic or hepatoduodenal ligament  lymphadenopathy. 8 mm short axis left para-aortic node on 26/2 is stable in the interval. No pelvic sidewall lymphadenopathy.  Reproductive: No adnexal mass although inferior pelvis is largely obscured by beam hardening artifact from left hip replacement.  Other: No intraperitoneal free fluid.  Musculoskeletal: Status post left total hip replacement. No worrisome lytic or sclerotic osseous abnormality.  IMPRESSION: 1. Stable moderate to severe left hydroureteronephrosis down to the middle third of the ureter where high attenuation material fills the lumen with abrupt tapering to normal ureteral caliber in the distal third. This could be blood products or soft tissue/neoplasm. Overall appearance is similar to prior study. 2. Nonobstructing right renal stones. 3. Stable 2.2 cm left adrenal nodule with attenuation too high to allow classification as an adenoma. 4. Subtle  tiny clustered nodularity posterior right lower lobe likely infectious/inflammatory. 5. Aortic Atherosclerosis (ICD10-I70.0).   Electronically Signed By: Misty Stanley M.D. On: 05/02/2021 21:23   Assessment & Plan:    1.  Urothelial carcinoma left ureter History low-grade urothelial carcinoma left mid ureter most recently on conservative management Since her last visit at [facility] she has developed left flank pain, hematuria and lower urinary tract symptoms Recheck basic metabolic panel today Has been asymptomatic since August 2021 until recently After in-depth record review recommend follow-up at [facility] to discuss further management options    Abbie Sons, MD  [facility] [address], [place], [place] [address] [phone]

## 2021-05-23 ENCOUNTER — Other Ambulatory Visit: Payer: Self-pay

## 2021-05-23 ENCOUNTER — Ambulatory Visit (INDEPENDENT_AMBULATORY_CARE_PROVIDER_SITE_OTHER): Payer: Medicare HMO | Admitting: Cardiovascular Disease

## 2021-05-23 ENCOUNTER — Encounter: Payer: Self-pay | Admitting: Cardiovascular Disease

## 2021-05-23 VITALS — BP 138/64 | HR 81 | Ht 62.5 in | Wt 168.4 lb

## 2021-05-23 DIAGNOSIS — G458 Other transient cerebral ischemic attacks and related syndromes: Secondary | ICD-10-CM | POA: Diagnosis not present

## 2021-05-23 DIAGNOSIS — E785 Hyperlipidemia, unspecified: Secondary | ICD-10-CM | POA: Diagnosis not present

## 2021-05-23 DIAGNOSIS — I700 Atherosclerosis of aorta: Secondary | ICD-10-CM

## 2021-05-23 DIAGNOSIS — I5032 Chronic diastolic (congestive) heart failure: Secondary | ICD-10-CM

## 2021-05-23 DIAGNOSIS — I5042 Chronic combined systolic (congestive) and diastolic (congestive) heart failure: Secondary | ICD-10-CM

## 2021-05-23 DIAGNOSIS — R001 Bradycardia, unspecified: Secondary | ICD-10-CM

## 2021-05-23 DIAGNOSIS — I10 Essential (primary) hypertension: Secondary | ICD-10-CM | POA: Diagnosis not present

## 2021-05-23 NOTE — Patient Instructions (Signed)
Medication Instructions:  No changes  If you need a refill on your cardiac medications before your next appointment, please call your pharmacy.   Lab work: No new labs needed  Testing/Procedures: No new testing needed  Follow-Up: At [facility], you and your health needs are our priority.  As part of our continuing mission to provide you with exceptional heart care, we have created designated Provider Care Teams.  These Care Teams include your primary Cardiologist (physician) and Advanced Practice Providers (APPs -  Physician Assistants and Nurse Practitioners) who all work together to provide you with the care you need, when you need it.  You will need a follow up appointment in 12 months  Providers on your designated Care Team:   Christopher Berge, NP Ryan Dunn, PA-C Cadence Furth, PA-C  COVID-19 Vaccine Information can be found at: [email or web address] For questions related to vaccine distribution or appointments, please email [email or web address] or call [phone].   

## 2021-05-24 ENCOUNTER — Telehealth: Payer: Self-pay | Admitting: Urology

## 2021-05-24 NOTE — Telephone Encounter (Signed)
I contacted [name]'s son Pamela James on 05/23/2021 at 1700.  Her creatinine is improved at 1.36 from a previous level of 1.98 on 05/01/2021.  He states her urinary symptoms and pain have improved with starting Gemtesa.  I reviewed her [facility] records and have recommended follow-up appointment there for further evaluation/treatment and he was in agreement. ?

## 2021-05-26 ENCOUNTER — Ambulatory Visit
Admission: RE | Admit: 2021-05-26 | Discharge: 2021-05-26 | Disposition: A | Discharge status: Home / Self Care | Payer: Medicare HMO | Source: Ambulatory Visit | Attending: Gastroenterology | Admitting: Gastroenterology

## 2021-05-26 ENCOUNTER — Encounter
Admission: RE | Disposition: A | Discharge status: Home / Self Care | Payer: Self-pay | Source: Ambulatory Visit | Attending: Gastroenterology

## 2021-05-26 ENCOUNTER — Ambulatory Visit: Payer: Medicare HMO | Admitting: Anesthesiology

## 2021-05-26 ENCOUNTER — Other Ambulatory Visit: Payer: Self-pay

## 2021-05-26 ENCOUNTER — Encounter: Payer: Self-pay | Admitting: *Deleted

## 2021-05-26 DIAGNOSIS — I700 Atherosclerosis of aorta: Secondary | ICD-10-CM | POA: Diagnosis not present

## 2021-05-26 DIAGNOSIS — Z86010 Personal history of colonic polyps: Secondary | ICD-10-CM | POA: Insufficient documentation

## 2021-05-26 DIAGNOSIS — K219 Gastro-esophageal reflux disease without esophagitis: Secondary | ICD-10-CM | POA: Diagnosis not present

## 2021-05-26 DIAGNOSIS — Z7952 Long term (current) use of systemic steroids: Secondary | ICD-10-CM | POA: Insufficient documentation

## 2021-05-26 DIAGNOSIS — I110 Hypertensive heart disease with heart failure: Secondary | ICD-10-CM | POA: Diagnosis not present

## 2021-05-26 DIAGNOSIS — F419 Anxiety disorder, unspecified: Secondary | ICD-10-CM | POA: Insufficient documentation

## 2021-05-26 DIAGNOSIS — Z09 Encounter for follow-up examination after completed treatment for conditions other than malignant neoplasm: Secondary | ICD-10-CM | POA: Diagnosis not present

## 2021-05-26 DIAGNOSIS — D126 Benign neoplasm of colon, unspecified: Secondary | ICD-10-CM | POA: Diagnosis not present

## 2021-05-26 DIAGNOSIS — D124 Benign neoplasm of descending colon: Secondary | ICD-10-CM | POA: Insufficient documentation

## 2021-05-26 DIAGNOSIS — Z1211 Encounter for screening for malignant neoplasm of colon: Secondary | ICD-10-CM | POA: Diagnosis not present

## 2021-05-26 DIAGNOSIS — F17200 Nicotine dependence, unspecified, uncomplicated: Secondary | ICD-10-CM | POA: Insufficient documentation

## 2021-05-26 DIAGNOSIS — Z7901 Long term (current) use of anticoagulants: Secondary | ICD-10-CM | POA: Diagnosis not present

## 2021-05-26 DIAGNOSIS — I509 Heart failure, unspecified: Secondary | ICD-10-CM | POA: Insufficient documentation

## 2021-05-26 DIAGNOSIS — K649 Unspecified hemorrhoids: Secondary | ICD-10-CM | POA: Diagnosis not present

## 2021-05-26 DIAGNOSIS — J449 Chronic obstructive pulmonary disease, unspecified: Secondary | ICD-10-CM | POA: Diagnosis not present

## 2021-05-26 DIAGNOSIS — K641 Second degree hemorrhoids: Secondary | ICD-10-CM | POA: Insufficient documentation

## 2021-05-26 DIAGNOSIS — D120 Benign neoplasm of cecum: Secondary | ICD-10-CM | POA: Diagnosis not present

## 2021-05-26 DIAGNOSIS — F32A Depression, unspecified: Secondary | ICD-10-CM | POA: Diagnosis not present

## 2021-05-26 DIAGNOSIS — D121 Benign neoplasm of appendix: Secondary | ICD-10-CM | POA: Diagnosis not present

## 2021-05-26 DIAGNOSIS — Z79899 Other long term (current) drug therapy: Secondary | ICD-10-CM | POA: Diagnosis not present

## 2021-05-26 DIAGNOSIS — K635 Polyp of colon: Secondary | ICD-10-CM | POA: Diagnosis not present

## 2021-05-26 DIAGNOSIS — D123 Benign neoplasm of transverse colon: Secondary | ICD-10-CM | POA: Insufficient documentation

## 2021-05-26 DIAGNOSIS — F319 Bipolar disorder, unspecified: Secondary | ICD-10-CM | POA: Insufficient documentation

## 2021-05-26 HISTORY — PX: COLONOSCOPY WITH PROPOFOL: SHX5780

## 2021-05-26 LAB — POCT I-STAT, CHEM 8
BUN: 13 mg/dL (ref 8–23)
Calcium, Ion: 1.25 mmol/L (ref 1.15–1.40)
Chloride: 108 mmol/L (ref 98–111)
Creatinine, Ser: 1.3 mg/dL — ABNORMAL HIGH (ref 0.44–1.00)
Glucose, Bld: 107 mg/dL — ABNORMAL HIGH (ref 70–99)
HCT: 36 % (ref 36.0–46.0)
Hemoglobin: 12.2 g/dL (ref 12.0–15.0)
Potassium: 3.9 mmol/L (ref 3.5–5.1)
Sodium: 143 mmol/L (ref 135–145)
TCO2: 27 mmol/L (ref 22–32)

## 2021-05-26 SURGERY — COLONOSCOPY WITH PROPOFOL
Anesthesia: General

## 2021-05-26 MED ORDER — SODIUM CHLORIDE 0.9 % IV SOLN: INTRAVENOUS | Status: DC

## 2021-05-26 MED ORDER — PROPOFOL 10 MG/ML IV BOLUS
INTRAVENOUS | Status: DC | PRN
  Administered 2021-05-26: 10 mg via INTRAVENOUS
  Administered 2021-05-26: 90 mg via INTRAVENOUS

## 2021-05-26 MED ORDER — HYDRALAZINE HCL 20 MG/ML IJ SOLN
INTRAMUSCULAR | Status: AC
  Administered 2021-05-26: 10 mg via INTRAVENOUS
  Filled 2021-05-26: qty 1

## 2021-05-26 MED ORDER — HYDRALAZINE HCL 20 MG/ML IJ SOLN
INTRAMUSCULAR | Status: AC
  Filled 2021-05-26: qty 1

## 2021-05-26 MED ORDER — PROPOFOL 500 MG/50ML IV EMUL
INTRAVENOUS | Status: DC | PRN
  Administered 2021-05-26: 150 ug/kg/min via INTRAVENOUS

## 2021-05-26 MED ORDER — LIDOCAINE HCL (CARDIAC) PF 100 MG/5ML IV SOSY
PREFILLED_SYRINGE | INTRAVENOUS | Status: DC | PRN
  Administered 2021-05-26: 40 mg via INTRAVENOUS

## 2021-05-26 MED ORDER — PROPOFOL 500 MG/50ML IV EMUL
INTRAVENOUS | Status: AC
Start: 2021-05-26 — End: ?
  Filled 2021-05-26: qty 50

## 2021-05-26 MED ORDER — HYDRALAZINE HCL 20 MG/ML IJ SOLN: 10.0000 mg | Freq: Once | INTRAMUSCULAR | Status: DC

## 2021-05-26 MED ORDER — HYDRALAZINE HCL 20 MG/ML IJ SOLN: 10.0000 mg | Freq: Once | INTRAMUSCULAR | Status: AC

## 2021-05-26 NOTE — Transfer of Care (Signed)
Immediate Anesthesia Transfer of Care Note ? ?Patient: [name] ? ?Procedure(s) Performed: Procedure(s) with comments: ?COLONOSCOPY WITH PROPOFOL (N/A) - PHONE # IS SON'S # (HE IS THE CAREGIVER) ? ?Patient Location: PACU and Endoscopy Unit ? ?Anesthesia Type:General ? ?Level of Consciousness: sedated ? ?Airway & Oxygen Therapy: Patient Spontanous Breathing and Patient connected to nasal cannula oxygen ? ?Post-op Assessment: Report given to RN and Post -op Vital signs reviewed and stable ? ?Post vital signs: Reviewed and stable ? ?Last Vitals:  ?Vitals:  ? 05/26/21 1249 05/26/21 1336  ?BP: (!) 213/71 (!) 142/62  ?Pulse: 78 73  ?Resp: 18 19  ?Temp:  (!) 35.8 ?C  ?SpO2: 100% 97%  ? ? ?Complications: No apparent anesthesia complications ?

## 2021-05-26 NOTE — Interval H&P Note (Signed)
History and Physical Interval Note: ? ?05/26/2021 ?12:31 PM ? ?Pamela James  has presented today for surgery, with the diagnosis of PH Colonic Polyps.  The various methods of treatment have been discussed with the patient and family. After consideration of risks, benefits and other options for treatment, the patient has consented to  Procedure(s) with comments: ?COLONOSCOPY WITH PROPOFOL (N/A) - PHONE # IS SON'S # (HE IS THE CAREGIVER) as a surgical intervention.  The patient's history has been reviewed, patient examined, no change in status, stable for surgery.  I have reviewed the patient's chart and labs.  Questions were answered to the patient's satisfaction.   ? ? ?[name] ? ?Ok to proceed with colonoscopy ?

## 2021-05-26 NOTE — Op Note (Signed)
[facility] ?Gastroenterology ?Patient Name: Pamela James ?Procedure Date: 05/26/2021 11:34 AM ?MRN: [id number] ?Account #: [id number] ?Date of Birth: 24-Jul-1945 ?Admit Type: Outpatient ?Age: 76 ?Room: [facility] ENDO ROOM 3 ?Gender: Female ?Note Status: Finalized ?Instrument Name: Colonoscope [id number] ?Procedure:             Colonoscopy ?Indications:           Surveillance: Personal history of adenomatous polyps  ?                       on last colonoscopy 3 years ago ?Providers:             Andrey Farmer MD, MD ?Medicines:             Monitored Anesthesia Care ?Complications:         No immediate complications. Estimated blood loss:  ?                       Minimal. ?Procedure:             Pre-Anesthesia Assessment: ?                       - Prior to the procedure, a History and Physical was  ?                       performed, and patient medications and allergies were  ?                       reviewed. The patient is competent. The risks and  ?                       benefits of the procedure and the sedation options and  ?                       risks were discussed with the patient. All questions  ?                       were answered and informed consent was obtained.  ?                       Patient identification and proposed procedure were  ?                       verified by the physician, the nurse, the  ?                       anesthesiologist, the anesthetist and the technician  ?                       in the endoscopy suite. Mental Status Examination:  ?                       alert and oriented. Airway Examination: normal  ?                       oropharyngeal airway and neck mobility. Respiratory  ?                       Examination: clear to auscultation. CV Examination:  ?  normal. Prophylactic Antibiotics: The patient does not  ?                       require prophylactic antibiotics. Prior  ?                       Anticoagulants: The patient has taken Eliquis  ?                        (apixaban), last dose was 7 days prior to procedure.  ?                       ASA Grade Assessment: II - A patient with mild  ?                       systemic disease. After reviewing the risks and  ?                       benefits, the patient was deemed in satisfactory  ?                       condition to undergo the procedure. The anesthesia  ?                       plan was to use monitored anesthesia care (MAC).  ?                       Immediately prior to administration of medications,  ?                       the patient was re-assessed for adequacy to receive  ?                       sedatives. The heart rate, respiratory rate, oxygen  ?                       saturations, blood pressure, adequacy of pulmonary  ?                       ventilation, and response to care were monitored  ?                       throughout the procedure. The physical status of the  ?                       patient was re-assessed after the procedure. ?                       After obtaining informed consent, the colonoscope was  ?                       passed under direct vision. Throughout the procedure,  ?                       the patient's blood pressure, pulse, and oxygen  ?                       saturations were monitored continuously. The  ?  Colonoscope was introduced through the anus and  ?                       advanced to the the cecum, identified by appendiceal  ?                       orifice and ileocecal valve. The colonoscopy was  ?                       performed without difficulty. The patient tolerated  ?                       the procedure well. The quality of the bowel  ?                       preparation was good. ?Findings: ?     The perianal and digital rectal examinations were normal. ?     A 3 mm polyp was found in the appendiceal orifice. The polyp was  ?     sessile. The polyp was removed with a cold snare. Resection and  ?     retrieval were complete. Estimated blood  loss was minimal. ?     A 2 mm polyp was found in the transverse colon. The polyp was sessile.  ?     The polyp was removed with a jumbo cold forceps. Resection and retrieval  ?     were complete. Estimated blood loss was minimal. ?     Two sessile polyps were found in the descending colon. The polyps were 1  ?     to 2 mm in size. These polyps were removed with a jumbo cold forceps.  ?     Resection and retrieval were complete. Estimated blood loss was minimal. ?     Internal hemorrhoids were found during retroflexion. The hemorrhoids  ?     were Grade II (internal hemorrhoids that prolapse but reduce  ?     spontaneously). ?     The exam was otherwise without abnormality on direct and retroflexion  ?     views. ?Impression:            - One 3 mm polyp at the appendiceal orifice, removed  ?                       with a cold snare. Resected and retrieved. ?                       - One 2 mm polyp in the transverse colon, removed with  ?                       a jumbo cold forceps. Resected and retrieved. ?                       - Two 1 to 2 mm polyps in the descending colon,  ?                       removed with a jumbo cold forceps. Resected and  ?                       retrieved. ?                       -  Internal hemorrhoids. ?                       - The examination was otherwise normal on direct and  ?                       retroflexion views. ?Recommendation:        - Discharge patient to home. ?                       - Resume previous diet. ?                       - Resume Eliquis (apixaban) at prior dose tomorrow. ?                       - Await pathology results. ?                       - Repeat colonoscopy is not recommended due to current  ?                       age (50 years or older) for surveillance. ?                       - Return to referring physician as previously  ?                       scheduled. ?Procedure Code(s):     --- Professional --- ?                       [phone], Colonoscopy, flexible;  with removal of  ?                       tumor(s), polyp(s), or other lesion(s) by snare  ?                       technique ?                       45380, 59, Colonoscopy, flexible; with biopsy, single  ?                       or multiple ?Diagnosis Code(s):     --- Professional --- ?                       K63.5, Polyp of colon ?                       Z86.010, Personal history of colonic polyps ?                       K64.1, Second degree hemorrhoids ?CPT copyright 2019 American Medical Association. All rights reserved. ?The codes documented in this report are preliminary and upon coder review may  ?be revised to meet current compliance requirements. ?Andrey Farmer MD, MD ?05/26/2021 1:36:22 PM ?Number of Addenda: 0 ?Note Initiated On: 05/26/2021 11:34 AM ?Scope Withdrawal Time: 0 hours 13 minutes 31 seconds  ?Total Procedure Duration: 0 hours 18 minutes 34 seconds  ?Estimated Blood Loss:  Estimated blood loss was minimal. ?  [facility] ?

## 2021-05-26 NOTE — H&P (Signed)
Outpatient short stay form Pre-procedure ?05/26/2021  ?Lesly Rubenstein, MD ? ?Primary Physician: Tracie Harrier, MD ? ?Reason for visit:  Surveillance ? ?History of present illness:   ? ?76 y/o lady with history of COPD, bipolar,  and hypertension here for surveillance colonoscopy. Had colonoscopy in 2019 with poor prep. History of hysterectomy and appendectomy. Takes eliquis with last dose over 3 days ago. No family history of GI malignancies. ? ? ? ?Current Facility-Administered Medications:  ?  0.9 %  sodium chloride infusion, , Intravenous, Continuous, Hasten Sweitzer, Hilton Cork, MD ?  hydrALAZINE (APRESOLINE) injection 10 mg, 10 mg, Intravenous, Once, Wynetta Emery Liliane Shi, MD ? ?Medications Prior to Admission  ?Medication Sig Dispense Refill Last Dose  ? albuterol (PROVENTIL HFA) 108 (90 Base) MCG/ACT inhaler Inhale 2 puffs into the lungs every 4 (four) hours as needed for wheezing or shortness of breath. 1 each 0 05/26/2021 at 1030  ? alendronate (FOSAMAX) 70 MG tablet Take 70 mg by mouth once a week. Take with a full glass of water on an empty stomach.   05/25/2021 at 0800  ? ALPRAZolam (XANAX) 0.25 MG tablet Take 0.25 mg by mouth daily as needed.   05/25/2021 at 0800  ? amLODipine (NORVASC) 5 MG tablet Take 5 mg by mouth daily.   05/26/2021 at 0930  ? apixaban (ELIQUIS) 5 MG TABS tablet Take 1 tablet (5 mg total) by mouth 2 (two) times daily. 60 tablet 1 05/19/2021  ? bisacodyl (DULCOLAX) 5 MG EC tablet Take 5 mg by mouth daily as needed.   Past Week  ? citalopram (CELEXA) 10 MG tablet Take 10 mg by mouth daily.    05/26/2021 at 0930  ? Ensure (ENSURE) Take 237 mLs by mouth 2 (two) times daily between meals.   05/26/2021  ? estradiol (ESTRACE) 0.1 MG/GM vaginal cream Place vaginally.   05/26/2021 at 09*30  ? ferrous sulfate 325 (65 FE) MG EC tablet Take 1 tablet (325 mg total) by mouth 2 (two) times daily. 180 tablet 1 Past Week  ? furosemide (LASIX) 20 MG tablet Take 1 tablet (20 mg total) by mouth every other day.    05/26/2021 at 0930  ? hydrALAZINE (APRESOLINE) 25 MG tablet Take 25 mg by mouth in the morning and at bedtime.   05/26/2021 at 0930  ? ipratropium (ATROVENT) 0.02 % nebulizer solution Take 2.5 mLs (0.5 mg total) by nebulization every 6 (six) hours as needed for wheezing or shortness of breath. 75 mL 0 05/26/2021 at 1030  ? memantine (NAMENDA) 5 MG tablet Take 1 tablet by mouth 2 (two) times daily.   05/26/2021 at 0930  ? nitroGLYCERIN (NITROSTAT) 0.4 MG SL tablet Place 1 tablet (0.4 mg total) under the tongue every 5 (five) minutes as needed for chest pain. 30 tablet 0 Past Week  ? omeprazole (PRILOSEC) 20 MG capsule Take 20 mg by mouth daily.   05/26/2021 at 0930  ? pravastatin (PRAVACHOL) 20 MG tablet Take 20 mg by mouth at bedtime.    05/26/2021 at 0930  ? tamsulosin (FLOMAX) 0.4 MG CAPS capsule Take 0.4 mg by mouth daily.   05/26/2021  ? traZODone (DESYREL) 150 MG tablet Take 150 mg by mouth at bedtime.   05/26/2021 at 0930  ? Vibegron (GEMTESA) 75 MG TABS Take 75 mg by mouth daily. 30 tablet 0 05/26/2021 at 0930  ? vitamin B-12 (CYANOCOBALAMIN) 1000 MCG tablet Take 1,000 mcg by mouth daily.   05/26/2021 at 0930  ? albuterol (PROVENTIL) (2.5 MG/3ML) 0.083%  nebulizer solution Take 2.5 mg by nebulization every 6 (six) hours as needed for wheezing or shortness of breath.     ? Docusate Sodium (DSS) 100 MG CAPS Take by mouth daily as needed.      ? predniSONE (DELTASONE) 50 MG tablet Take 50 mg by mouth daily as needed.     ? traMADol (ULTRAM) 50 MG tablet Take 50 mg by mouth at bedtime.     ? traMADol (ULTRAM) 50 MG tablet Take 1 tablet (50 mg total) by mouth every 6 (six) hours as needed. 15 tablet 0   ? ? ? ?Allergies  ?Allergen Reactions  ? Morphine And Related Other (See Comments)  ?  Delerium, itching  ? Nsaids Other (See Comments)  ?  GI ulcers  ? Aspirin Other (See Comments)  ?  GI ulcers Can tolerate 81 mg daily  ? ? ? ?Past Medical History:  ?Diagnosis Date  ? Anxiety   ? Asthma   ? CHF (congestive heart failure)  ([place])   ? COPD (chronic obstructive pulmonary disease) ([place])   ? Depression   ? GERD (gastroesophageal reflux disease)   ? Hypertension   ? ? ?Review of systems:  Otherwise negative.  ? ? ?Physical Exam ? ?Gen: Alert, oriented. Appears stated age.  ?HEENT: PERRLA. ?Lungs: No respiratory distress ?CV: RRR ?Abd: soft, benign, no masses ?Ext: No edema ? ? ? ?Planned procedures: Proceed with colonoscopy. The patient understands the nature of the planned procedure, indications, risks, alternatives and potential complications including but not limited to bleeding, infection, perforation, damage to internal organs and possible oversedation/side effects from anesthesia. The patient agrees and gives consent to proceed.  ?Please refer to procedure notes for findings, recommendations and patient disposition/instructions.  ? ? ? ?Lesly Rubenstein, MD ?[facility] ? ? ? ?  ? ?

## 2021-05-26 NOTE — Anesthesia Procedure Notes (Signed)
Date/Time: 05/26/2021 1:02 PM ?Performed by: Doreen Salvage, CRNA ?Pre-anesthesia Checklist: Patient identified, Emergency Drugs available, Suction available and Patient being monitored ?Patient Re-evaluated:Patient Re-evaluated prior to induction ?Oxygen Delivery Method: Nasal cannula ?Induction Type: IV induction ?Dental Injury: Teeth and Oropharynx as per pre-operative assessment  ?Comments: Nasal cannula with etCO2 monitoring ? ? ? ? ?

## 2021-05-26 NOTE — Anesthesia Preprocedure Evaluation (Addendum)
Anesthesia Evaluation  ?Patient identified by MRN, date of birth, ID band ?Patient awake ? ? ? ?Reviewed: ?Allergy & Precautions, NPO status , Patient's Chart, lab work & pertinent test results ? ?Airway ?Mallampati: III ? ?TM Distance: >3 FB ?Neck ROM: full ? ? ? Dental ? ?(+) Edentulous Upper, Edentulous Lower ?  ?Pulmonary ?COPD,  COPD inhaler, Current Smoker, former smoker,  ?  ?Pulmonary exam normal ? ? ? ? ? ? ? Cardiovascular ?Exercise Tolerance: Poor ?hypertension, Pt. on medications ?+CHF  ?Normal cardiovascular exam+ Valvular Problems/Murmurs MR  ? ?aortic atherosclerosis ? ?EKG 2/23: ?Normal sinus rhythm rate 81 bpm no significant ST-T wave changes ? ?Cardiology visit 3/23 reviewed: ?Echo shows hyperdynamic pump function at 60-65% ?G2DD ?Moderate LVH ?Mild to moderate MR and mild AR ? ?Eliquis taken for subclavian artery thrombosis ?  ?Neuro/Psych ?PSYCHIATRIC DISORDERS Anxiety Depression negative neurological ROS ?   ? GI/Hepatic ?Neg liver ROS, GERD  Controlled and Medicated,  ?Endo/Other  ?negative endocrine ROS ? Renal/GU ?Renal InsufficiencyRenal diseaseCr 1.36 from a previous level of 1.98 on 05/01/2021 ? 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  ?Musculoskeletal ? ? Abdominal ?  ?Peds ? Hematology ? ?(+) Blood dyscrasia (Hgb 11.1), anemia , MGUA and thrombocytosis   ?Anesthesia Other Findings ?H/o Hyperkalemia ? ? ?Past Medical History: ?No date: Anxiety ?No date: Asthma ?No date: CHF (congestive heart failure) ([place]) ?No date: COPD (chronic obstructive pulmonary disease) ([place]) ?No date: Depression ?No date: GERD (gastroesophageal reflux disease) ?No date: Hypertension ? ?Past Surgical History: ?No date: ABDOMINAL HYSTERECTOMY ?No date: TOTAL HIP ARTHROPLASTY; Left ?No date: TUBAL LIGATION ? ?H/O syncope with bradycardia that resulted in CPR in July 2021. Intubation with admission to  ICU was complicated by  aspiration pneumonia, pulmonary edema requiring IV Lasix, left subclavian clot (started on DOAC), hypertensive urgency, questionable alcohol withdrawal, metabolic encephalopathy, and bradycardia/hypotension ? ? ? ? Reproductive/Obstetrics ?negative OB ROS ? ?  ? ? ? ? ? ? ? ? ? ? ? ? ? ?  ?  ? ? ? ? ? ? ?Anesthesia Physical ?Anesthesia Plan ? ?ASA: 3 ? ?Anesthesia Plan: General  ? ?Post-op Pain Management:   ? ?Induction: Intravenous ? ?PONV Risk Score and Plan: Propofol infusion and TIVA ? ?Airway Management Planned: Natural Airway and Simple Face Mask ? ?Additional Equipment:  ? ?Intra-op Plan:  ? ?Post-operative Plan:  ? ?Informed Consent: I have reviewed the patients History and Physical, chart, labs and discussed the procedure including the risks, benefits and alternatives for the proposed anesthesia with the patient or authorized representative who has indicated his/her understanding and acceptance.  ? ? ? ?Dental advisory given ? ?Plan Discussed with: Anesthesiologist, CRNA and Surgeon ? ?Anesthesia Plan Comments: (K rechecked. Pt hypertensive without symptoms of emergency. Pt took her prescribed medications. '10mg'$  Hydralazine was administered with effect.)  ? ? ? ? ? ?Anesthesia Quick Evaluation ? ?

## 2021-05-27 LAB — SURGICAL PATHOLOGY

## 2021-05-27 NOTE — Anesthesia Postprocedure Evaluation (Signed)
Anesthesia James Note ? ?Patient: Pamela James ? ?Procedure(s) Performed: COLONOSCOPY WITH PROPOFOL ? ?Patient location during evaluation: PACU ?Anesthesia Type: General ?Level of consciousness: awake and alert ?Pain management: pain level controlled ?Vital Signs Assessment: James-procedure vital signs reviewed and stable ?Respiratory status: spontaneous breathing, nonlabored ventilation and respiratory function stable ?Cardiovascular status: blood pressure returned to baseline and stable ?Postop Assessment: no apparent nausea or vomiting ?Anesthetic complications: no ? ? ?No notable events documented. ? ? ?Last Vitals:  ?Vitals:  ? 05/26/21 1356 05/26/21 1406  ?BP: (!) 169/97 (!) 170/83  ?Pulse: 67 70  ?Resp: 18 20  ?Temp:    ?SpO2: 99% 99%  ?  ?Last Pain:  ?Vitals:  ? 05/26/21 1336  ?TempSrc: Temporal  ?PainSc: Asleep  ? ? ?  ?  ?  ?  ?  ?  ? ?[place] Ouch ? ? ? ? ?

## 2021-05-28 ENCOUNTER — Encounter: Payer: Self-pay | Admitting: Urology

## 2021-06-02 DIAGNOSIS — C689 Malignant neoplasm of urinary organ, unspecified: Secondary | ICD-10-CM | POA: Diagnosis not present

## 2021-06-02 DIAGNOSIS — R310 Gross hematuria: Secondary | ICD-10-CM | POA: Diagnosis not present

## 2021-06-09 DIAGNOSIS — N329 Bladder disorder, unspecified: Secondary | ICD-10-CM | POA: Diagnosis not present

## 2021-06-09 DIAGNOSIS — C689 Malignant neoplasm of urinary organ, unspecified: Secondary | ICD-10-CM | POA: Diagnosis not present

## 2021-06-09 DIAGNOSIS — N2889 Other specified disorders of kidney and ureter: Secondary | ICD-10-CM | POA: Diagnosis not present

## 2021-07-01 DIAGNOSIS — N2889 Other specified disorders of kidney and ureter: Secondary | ICD-10-CM | POA: Diagnosis not present

## 2021-07-01 DIAGNOSIS — C674 Malignant neoplasm of posterior wall of bladder: Secondary | ICD-10-CM | POA: Diagnosis not present

## 2021-07-02 DIAGNOSIS — E049 Nontoxic goiter, unspecified: Secondary | ICD-10-CM | POA: Diagnosis not present

## 2021-07-02 DIAGNOSIS — R630 Anorexia: Secondary | ICD-10-CM | POA: Diagnosis not present

## 2021-07-02 DIAGNOSIS — I10 Essential (primary) hypertension: Secondary | ICD-10-CM | POA: Diagnosis not present

## 2021-07-02 DIAGNOSIS — R634 Abnormal weight loss: Secondary | ICD-10-CM | POA: Diagnosis not present

## 2021-07-02 DIAGNOSIS — F439 Reaction to severe stress, unspecified: Secondary | ICD-10-CM | POA: Diagnosis not present

## 2021-07-02 DIAGNOSIS — I129 Hypertensive chronic kidney disease with stage 1 through stage 4 chronic kidney disease, or unspecified chronic kidney disease: Secondary | ICD-10-CM | POA: Diagnosis not present

## 2021-07-02 DIAGNOSIS — F411 Generalized anxiety disorder: Secondary | ICD-10-CM | POA: Diagnosis not present

## 2021-07-02 DIAGNOSIS — F3341 Major depressive disorder, recurrent, in partial remission: Secondary | ICD-10-CM | POA: Diagnosis not present

## 2021-07-02 DIAGNOSIS — F419 Anxiety disorder, unspecified: Secondary | ICD-10-CM | POA: Diagnosis not present

## 2021-07-15 DIAGNOSIS — R4189 Other symptoms and signs involving cognitive functions and awareness: Secondary | ICD-10-CM | POA: Diagnosis not present

## 2021-07-15 DIAGNOSIS — Z7189 Other specified counseling: Secondary | ICD-10-CM | POA: Diagnosis not present

## 2021-07-15 DIAGNOSIS — R5381 Other malaise: Secondary | ICD-10-CM | POA: Diagnosis not present

## 2021-07-15 DIAGNOSIS — Z9189 Other specified personal risk factors, not elsewhere classified: Secondary | ICD-10-CM | POA: Diagnosis not present

## 2021-07-15 DIAGNOSIS — Z01818 Encounter for other preprocedural examination: Secondary | ICD-10-CM | POA: Diagnosis not present

## 2021-07-15 DIAGNOSIS — C674 Malignant neoplasm of posterior wall of bladder: Secondary | ICD-10-CM | POA: Diagnosis not present

## 2021-07-15 DIAGNOSIS — I10 Essential (primary) hypertension: Secondary | ICD-10-CM | POA: Diagnosis not present

## 2021-07-15 DIAGNOSIS — R630 Anorexia: Secondary | ICD-10-CM | POA: Diagnosis not present

## 2021-08-01 DIAGNOSIS — D494 Neoplasm of unspecified behavior of bladder: Secondary | ICD-10-CM | POA: Diagnosis not present

## 2021-08-01 DIAGNOSIS — R310 Gross hematuria: Secondary | ICD-10-CM | POA: Diagnosis not present

## 2021-08-01 DIAGNOSIS — I10 Essential (primary) hypertension: Secondary | ICD-10-CM | POA: Diagnosis not present

## 2021-08-13 DIAGNOSIS — C674 Malignant neoplasm of posterior wall of bladder: Secondary | ICD-10-CM | POA: Diagnosis not present

## 2021-08-13 DIAGNOSIS — Z720 Tobacco use: Secondary | ICD-10-CM | POA: Diagnosis not present

## 2021-08-14 DIAGNOSIS — E049 Nontoxic goiter, unspecified: Secondary | ICD-10-CM | POA: Diagnosis not present

## 2021-08-27 DIAGNOSIS — F32A Depression, unspecified: Secondary | ICD-10-CM | POA: Diagnosis not present

## 2021-08-27 DIAGNOSIS — F419 Anxiety disorder, unspecified: Secondary | ICD-10-CM | POA: Diagnosis not present

## 2021-08-27 DIAGNOSIS — R110 Nausea: Secondary | ICD-10-CM | POA: Diagnosis not present

## 2021-08-27 DIAGNOSIS — C662 Malignant neoplasm of left ureter: Secondary | ICD-10-CM | POA: Diagnosis not present

## 2021-08-27 DIAGNOSIS — F17210 Nicotine dependence, cigarettes, uncomplicated: Secondary | ICD-10-CM | POA: Diagnosis not present

## 2021-08-27 DIAGNOSIS — R4189 Other symptoms and signs involving cognitive functions and awareness: Secondary | ICD-10-CM | POA: Diagnosis not present

## 2021-08-27 DIAGNOSIS — Z515 Encounter for palliative care: Secondary | ICD-10-CM | POA: Diagnosis not present

## 2021-08-27 DIAGNOSIS — N1330 Unspecified hydronephrosis: Secondary | ICD-10-CM | POA: Diagnosis not present

## 2021-08-27 DIAGNOSIS — R109 Unspecified abdominal pain: Secondary | ICD-10-CM | POA: Diagnosis not present

## 2021-08-27 DIAGNOSIS — N289 Disorder of kidney and ureter, unspecified: Secondary | ICD-10-CM | POA: Diagnosis not present

## 2021-08-27 DIAGNOSIS — Z5329 Procedure and treatment not carried out because of patient's decision for other reasons: Secondary | ICD-10-CM | POA: Diagnosis not present

## 2021-08-27 DIAGNOSIS — C679 Malignant neoplasm of bladder, unspecified: Secondary | ICD-10-CM | POA: Diagnosis not present

## 2021-08-27 DIAGNOSIS — N131 Hydronephrosis with ureteral stricture, not elsewhere classified: Secondary | ICD-10-CM | POA: Diagnosis not present

## 2021-08-27 DIAGNOSIS — G893 Neoplasm related pain (acute) (chronic): Secondary | ICD-10-CM | POA: Diagnosis not present

## 2021-10-13 ENCOUNTER — Other Ambulatory Visit: Payer: Self-pay

## 2021-10-13 NOTE — Patient Outreach (Signed)
[facility]) Care Management  10/13/2021  Pamela James [birth year] [id number]   Telephone Screen    Outreach call to patient to introduce [facility] services and assess care needs as part of benefit of PCP office and insurance plan.Spoke with son/primary caregiver/POA.    Main healthcare issue/concern today: Son vocies some frustrations he has had trying to navigate the health system over the past few years. He is pleased at how well his mother is currently doing. States she has mass that they are monitoring but have decided against surgery at this time.   Caregiver confirms that patient has both [employer] and Medicaid. She has no cost for meds. Son drives and accompanies patient to all MD appts. Patient also active with palliative care medicine(gets telemedicine visits). No RN CM needs voiced at this time. Encouraged to contact assigned CM through insurance provider for any future needs/concerns.      Plan: RN CM will close case.   Enzo Montgomery, RN,BSN,CCM [facility] Telephonic Care Management Coordinator Direct Phone: [phone] Toll Free: [phone] Fax: [phone]

## 2021-10-14 DIAGNOSIS — M810 Age-related osteoporosis without current pathological fracture: Secondary | ICD-10-CM | POA: Diagnosis not present

## 2021-10-14 DIAGNOSIS — E042 Nontoxic multinodular goiter: Secondary | ICD-10-CM | POA: Diagnosis not present

## 2021-10-14 DIAGNOSIS — E559 Vitamin D deficiency, unspecified: Secondary | ICD-10-CM | POA: Diagnosis not present

## 2021-10-14 DIAGNOSIS — E278 Other specified disorders of adrenal gland: Secondary | ICD-10-CM | POA: Diagnosis not present

## 2021-11-12 DIAGNOSIS — G893 Neoplasm related pain (acute) (chronic): Secondary | ICD-10-CM | POA: Diagnosis not present

## 2021-11-12 DIAGNOSIS — K5903 Drug induced constipation: Secondary | ICD-10-CM | POA: Diagnosis not present

## 2021-11-12 DIAGNOSIS — T402X5A Adverse effect of other opioids, initial encounter: Secondary | ICD-10-CM | POA: Diagnosis not present

## 2021-11-12 DIAGNOSIS — Z515 Encounter for palliative care: Secondary | ICD-10-CM | POA: Diagnosis not present

## 2021-11-20 DIAGNOSIS — E041 Nontoxic single thyroid nodule: Secondary | ICD-10-CM | POA: Diagnosis not present

## 2021-11-20 DIAGNOSIS — E042 Nontoxic multinodular goiter: Secondary | ICD-10-CM | POA: Diagnosis not present

## 2021-11-26 DIAGNOSIS — Z87891 Personal history of nicotine dependence: Secondary | ICD-10-CM | POA: Diagnosis not present

## 2021-11-26 DIAGNOSIS — J449 Chronic obstructive pulmonary disease, unspecified: Secondary | ICD-10-CM | POA: Diagnosis not present

## 2021-11-26 DIAGNOSIS — I5030 Unspecified diastolic (congestive) heart failure: Secondary | ICD-10-CM | POA: Diagnosis not present

## 2021-11-26 DIAGNOSIS — L298 Other pruritus: Secondary | ICD-10-CM | POA: Diagnosis not present

## 2021-11-26 DIAGNOSIS — N189 Chronic kidney disease, unspecified: Secondary | ICD-10-CM | POA: Diagnosis not present

## 2021-11-26 DIAGNOSIS — I130 Hypertensive heart and chronic kidney disease with heart failure and stage 1 through stage 4 chronic kidney disease, or unspecified chronic kidney disease: Secondary | ICD-10-CM | POA: Diagnosis not present

## 2021-11-26 DIAGNOSIS — I82B12 Acute embolism and thrombosis of left subclavian vein: Secondary | ICD-10-CM | POA: Diagnosis not present

## 2021-11-26 DIAGNOSIS — F419 Anxiety disorder, unspecified: Secondary | ICD-10-CM | POA: Diagnosis not present

## 2021-12-03 IMAGING — DX DG CHEST 1V PORT
1 series · 1 of 1 positions shown · non-contrast
Comparison: 11/17/2019

CLINICAL DATA: Shortness of breath

EXAM:
PORTABLE CHEST 1 VIEW

[chest ap]
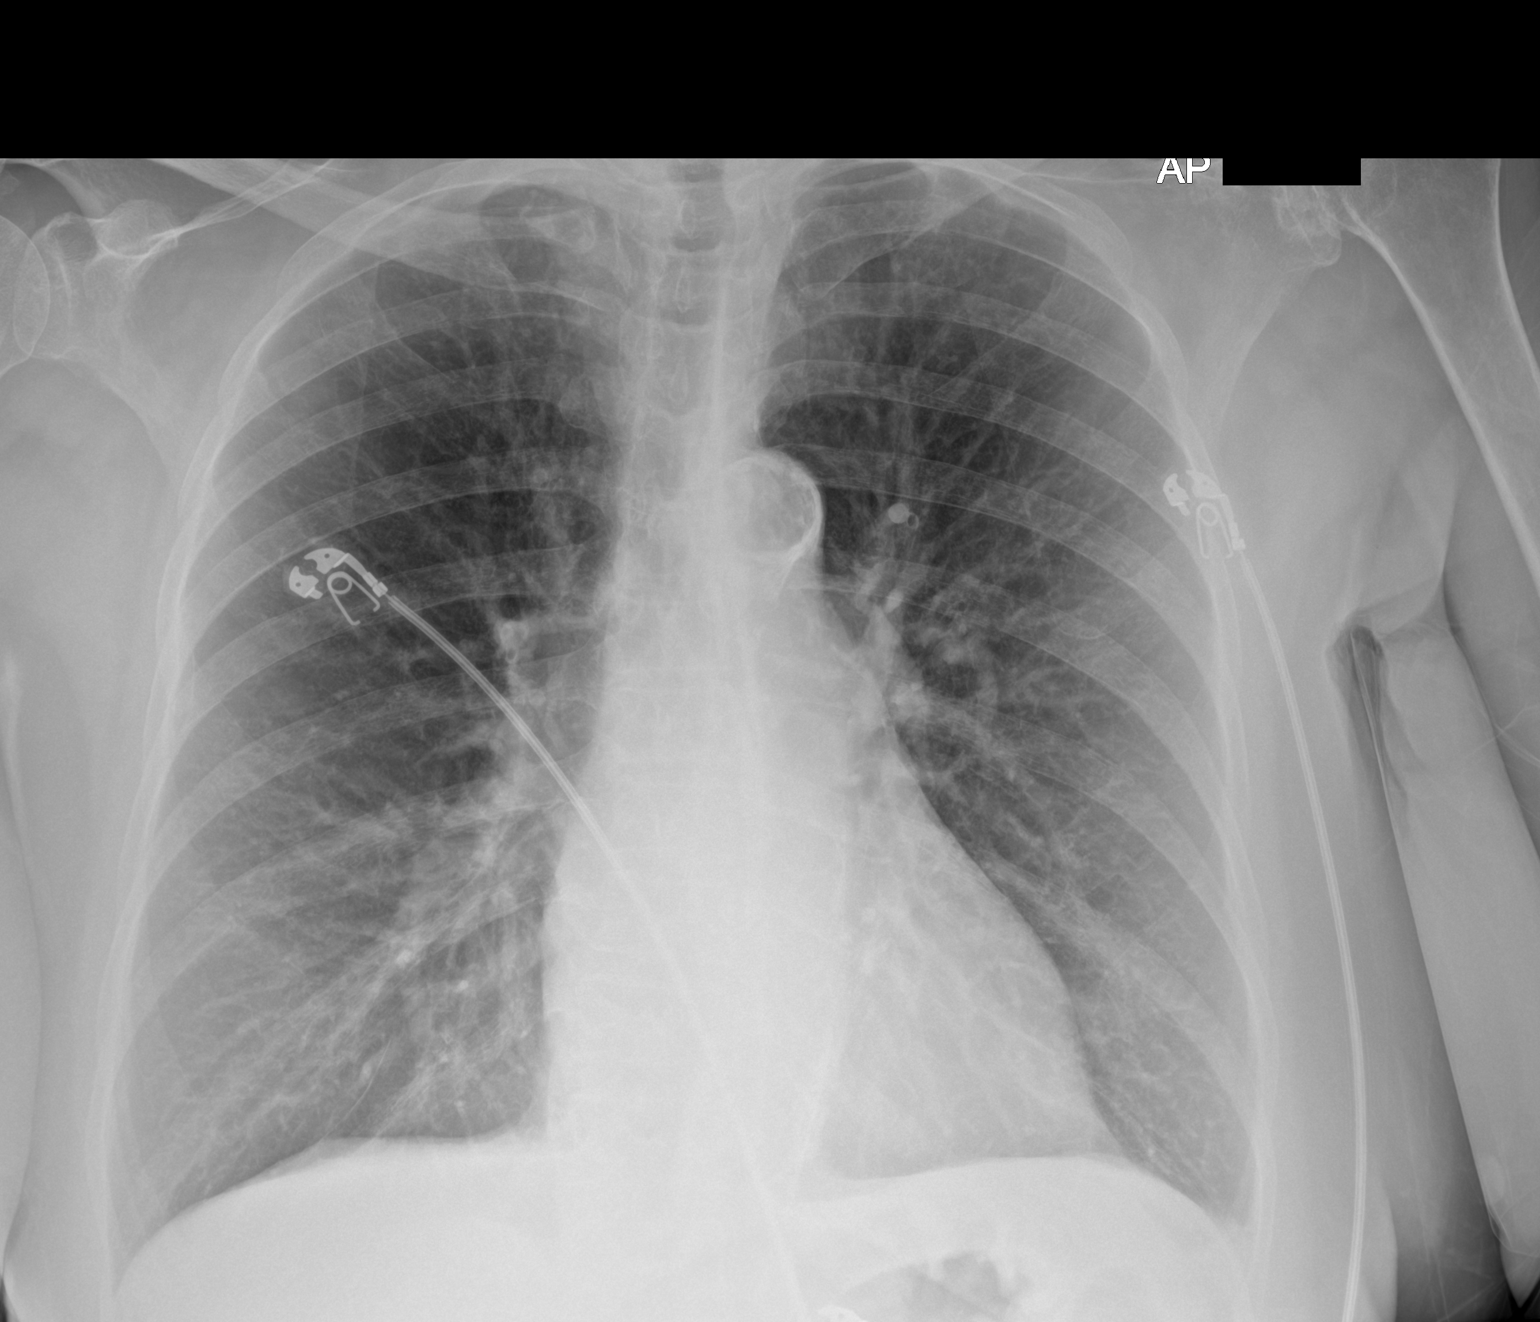

[1 of 1 positions shown; findings below may reference images not displayed]

FINDINGS: Generous lung volumes and interstitial prominence. Mild airway
thickening and emphysema seen on 3535 CT. There is no edema,
consolidation, effusion, or pneumothorax. Normal heart size and
mediastinal contours.
IMPRESSION: COPD without acute superimposed finding.

## 2021-12-10 DIAGNOSIS — G4701 Insomnia due to medical condition: Secondary | ICD-10-CM | POA: Diagnosis not present

## 2021-12-10 DIAGNOSIS — T402X5A Adverse effect of other opioids, initial encounter: Secondary | ICD-10-CM | POA: Diagnosis not present

## 2021-12-10 DIAGNOSIS — G893 Neoplasm related pain (acute) (chronic): Secondary | ICD-10-CM | POA: Diagnosis not present

## 2021-12-10 DIAGNOSIS — F32A Depression, unspecified: Secondary | ICD-10-CM | POA: Diagnosis not present

## 2021-12-10 DIAGNOSIS — K5903 Drug induced constipation: Secondary | ICD-10-CM | POA: Diagnosis not present

## 2021-12-10 DIAGNOSIS — F419 Anxiety disorder, unspecified: Secondary | ICD-10-CM | POA: Diagnosis not present

## 2021-12-13 IMAGING — DX DG CHEST 1V PORT
1 series · 1 of 1 positions shown · non-contrast
Comparison: September 17, 2019

CLINICAL DATA: Acute respiratory failure

EXAM:
PORTABLE CHEST 1 VIEW

[chest ap]
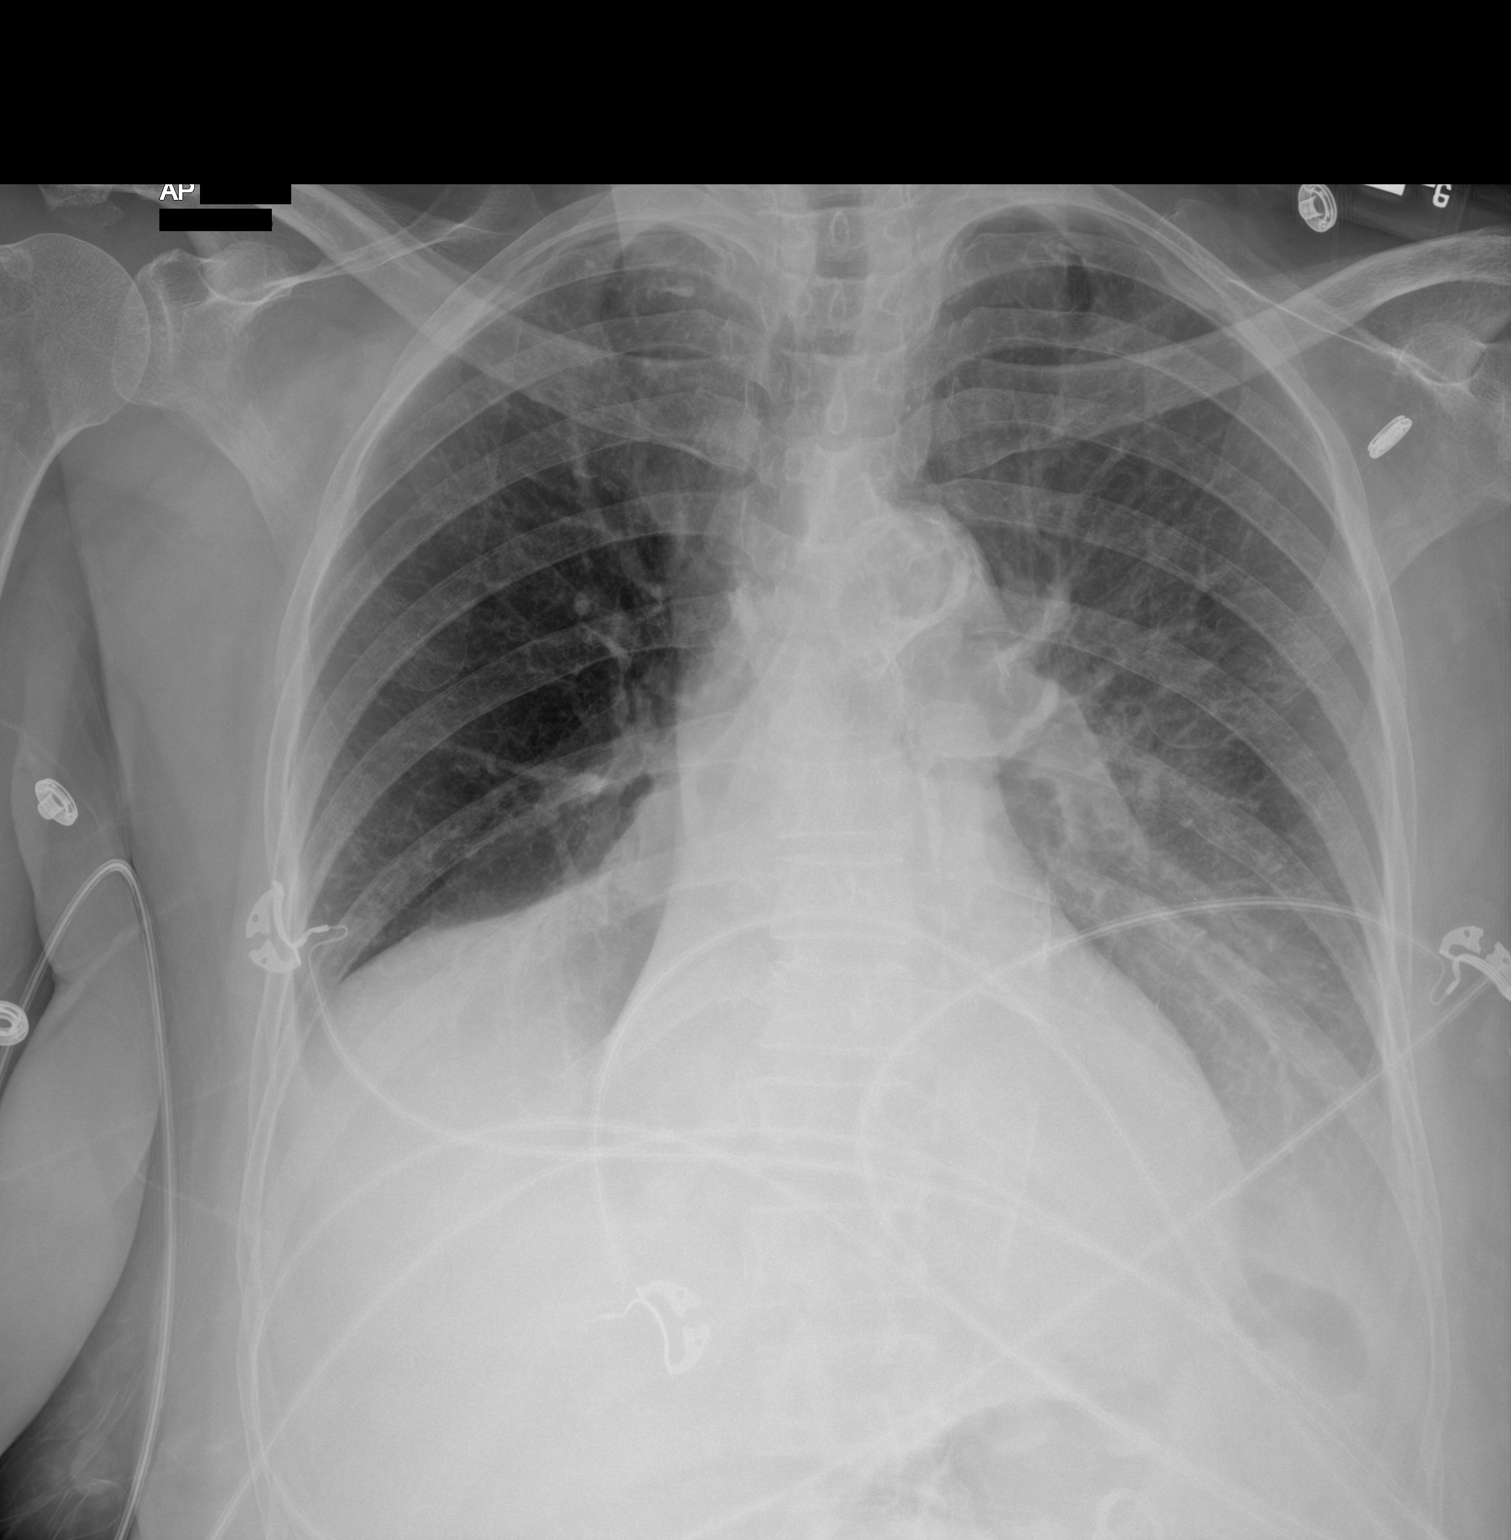

[1 of 1 positions shown; findings below may reference images not displayed]

FINDINGS: No pneumothorax. The heart size is borderline. The hila and
mediastinum are unchanged. New layering effusion with underlying
atelectasis on the left. New dense opacity in the right base. No
other acute abnormalities.
IMPRESSION: 1. New layering effusion on the left.
2. New dense opacity in the right base may represent effusion with
underlying atelectasis or pneumonia. Recommend short-term follow-up
imaging to ensure resolution.

## 2021-12-31 DIAGNOSIS — F411 Generalized anxiety disorder: Secondary | ICD-10-CM | POA: Diagnosis not present

## 2021-12-31 DIAGNOSIS — J45909 Unspecified asthma, uncomplicated: Secondary | ICD-10-CM | POA: Diagnosis not present

## 2021-12-31 DIAGNOSIS — N184 Chronic kidney disease, stage 4 (severe): Secondary | ICD-10-CM | POA: Diagnosis not present

## 2022-02-05 IMAGING — DX DG CHEST 1V PORT
1 series · 1 of 1 positions shown · non-contrast
Comparison: 11/09/2019

CLINICAL DATA: Shortness of breath, weakness

EXAM:
PORTABLE CHEST 1 VIEW

[chest ap]
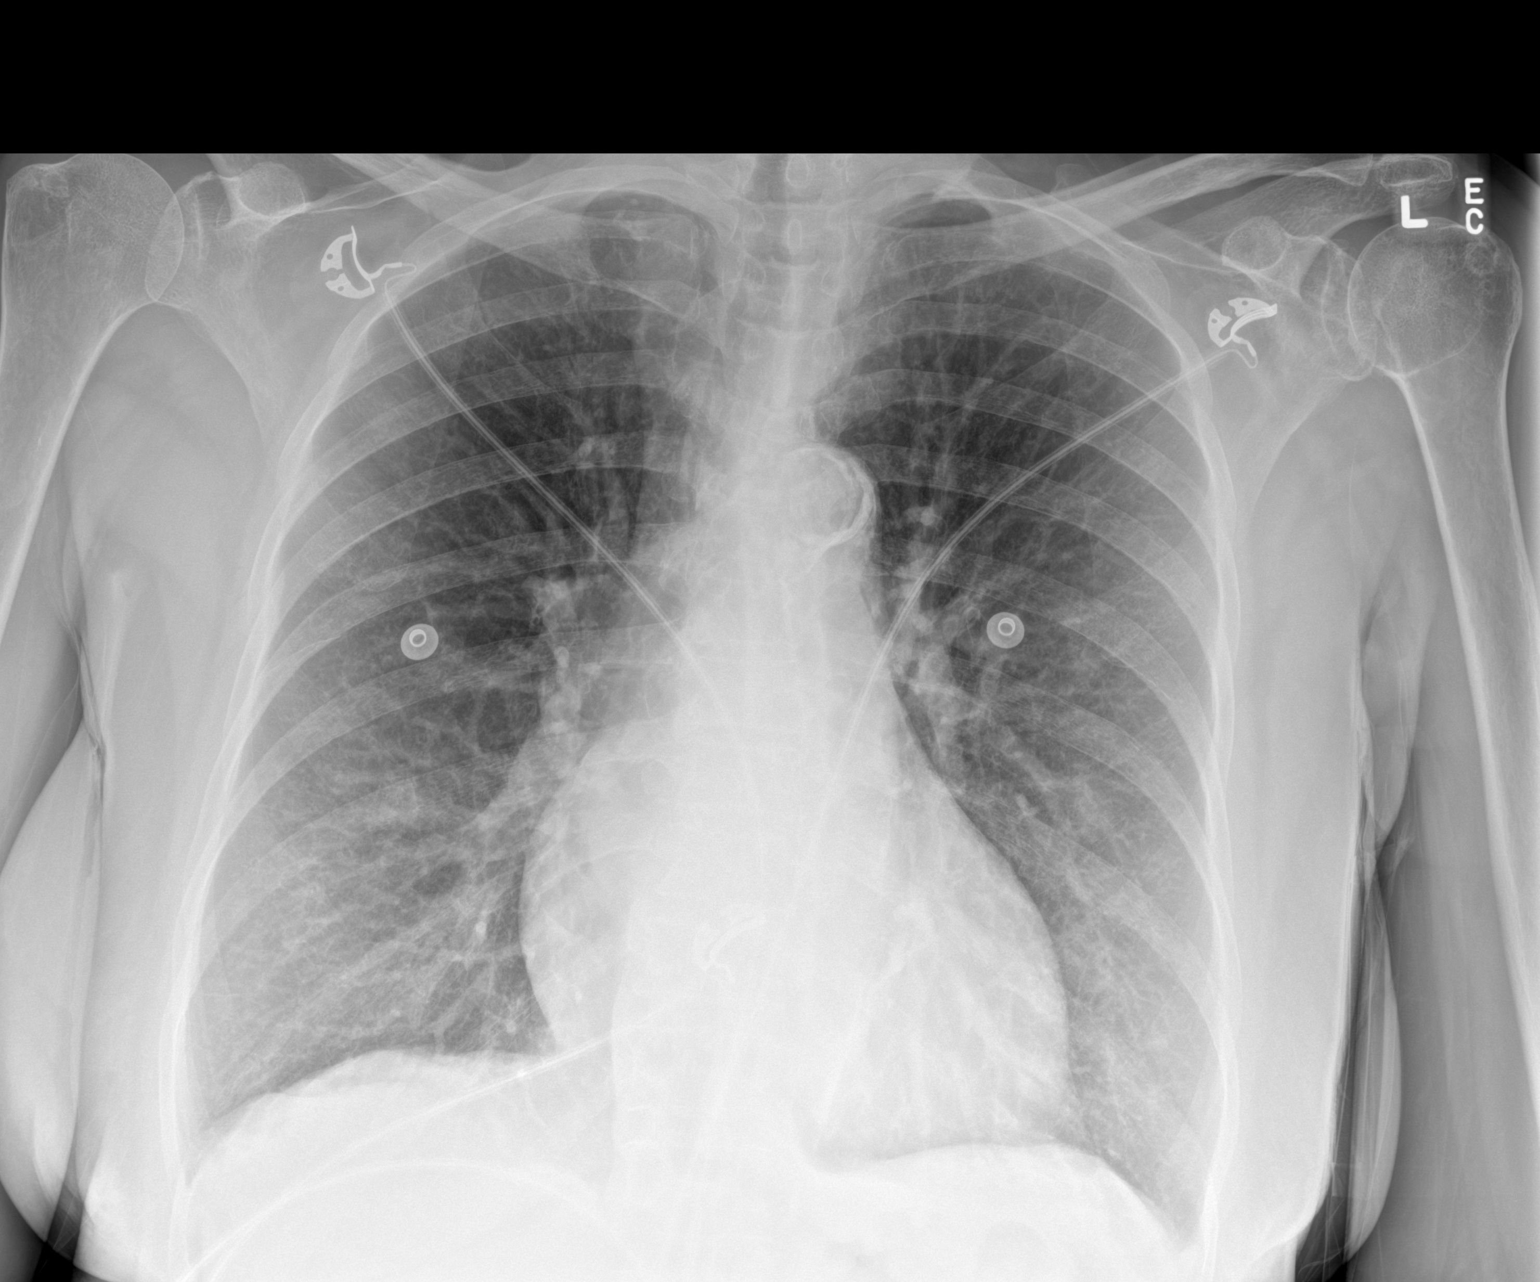

[1 of 1 positions shown; findings below may reference images not displayed]

FINDINGS: Aortic atherosclerosis. Heart is normal size. Mild hyperinflation of
the lungs. No confluent opacities or effusions. No acute bony
abnormality.
IMPRESSION: Hyperinflation/COPD.  No active disease.

## 2022-03-06 DIAGNOSIS — E278 Other specified disorders of adrenal gland: Secondary | ICD-10-CM | POA: Diagnosis not present

## 2022-03-06 DIAGNOSIS — F411 Generalized anxiety disorder: Secondary | ICD-10-CM | POA: Diagnosis not present

## 2022-03-06 DIAGNOSIS — L298 Other pruritus: Secondary | ICD-10-CM | POA: Diagnosis not present

## 2022-03-06 DIAGNOSIS — F3340 Major depressive disorder, recurrent, in remission, unspecified: Secondary | ICD-10-CM | POA: Diagnosis not present

## 2022-03-06 DIAGNOSIS — I358 Other nonrheumatic aortic valve disorders: Secondary | ICD-10-CM | POA: Diagnosis not present

## 2022-03-06 DIAGNOSIS — D75839 Thrombocytosis, unspecified: Secondary | ICD-10-CM | POA: Diagnosis not present

## 2022-03-06 DIAGNOSIS — N184 Chronic kidney disease, stage 4 (severe): Secondary | ICD-10-CM | POA: Diagnosis not present

## 2022-03-13 DIAGNOSIS — Z87891 Personal history of nicotine dependence: Secondary | ICD-10-CM | POA: Diagnosis not present

## 2022-03-13 DIAGNOSIS — Z1331 Encounter for screening for depression: Secondary | ICD-10-CM | POA: Diagnosis not present

## 2022-03-13 DIAGNOSIS — F329 Major depressive disorder, single episode, unspecified: Secondary | ICD-10-CM | POA: Diagnosis not present

## 2022-03-13 DIAGNOSIS — I130 Hypertensive heart and chronic kidney disease with heart failure and stage 1 through stage 4 chronic kidney disease, or unspecified chronic kidney disease: Secondary | ICD-10-CM | POA: Diagnosis not present

## 2022-03-13 DIAGNOSIS — Z0000 Encounter for general adult medical examination without abnormal findings: Secondary | ICD-10-CM | POA: Diagnosis not present

## 2022-03-13 DIAGNOSIS — J449 Chronic obstructive pulmonary disease, unspecified: Secondary | ICD-10-CM | POA: Diagnosis not present

## 2022-03-13 DIAGNOSIS — M25552 Pain in left hip: Secondary | ICD-10-CM | POA: Diagnosis not present

## 2022-03-13 DIAGNOSIS — F411 Generalized anxiety disorder: Secondary | ICD-10-CM | POA: Diagnosis not present

## 2022-03-13 DIAGNOSIS — Z96642 Presence of left artificial hip joint: Secondary | ICD-10-CM | POA: Diagnosis not present

## 2022-03-13 DIAGNOSIS — R413 Other amnesia: Secondary | ICD-10-CM | POA: Diagnosis not present

## 2022-04-07 DIAGNOSIS — C689 Malignant neoplasm of urinary organ, unspecified: Secondary | ICD-10-CM | POA: Diagnosis not present

## 2022-04-15 DIAGNOSIS — T402X5A Adverse effect of other opioids, initial encounter: Secondary | ICD-10-CM | POA: Diagnosis not present

## 2022-04-15 DIAGNOSIS — G893 Neoplasm related pain (acute) (chronic): Secondary | ICD-10-CM | POA: Diagnosis not present

## 2022-04-15 DIAGNOSIS — E860 Dehydration: Secondary | ICD-10-CM | POA: Diagnosis not present

## 2022-04-15 DIAGNOSIS — Z7189 Other specified counseling: Secondary | ICD-10-CM | POA: Diagnosis not present

## 2022-04-15 DIAGNOSIS — K5903 Drug induced constipation: Secondary | ICD-10-CM | POA: Diagnosis not present

## 2022-07-03 DIAGNOSIS — J4530 Mild persistent asthma, uncomplicated: Secondary | ICD-10-CM | POA: Diagnosis not present

## 2022-07-03 DIAGNOSIS — I5030 Unspecified diastolic (congestive) heart failure: Secondary | ICD-10-CM | POA: Diagnosis not present

## 2022-07-03 DIAGNOSIS — F411 Generalized anxiety disorder: Secondary | ICD-10-CM | POA: Diagnosis not present

## 2022-07-03 DIAGNOSIS — I130 Hypertensive heart and chronic kidney disease with heart failure and stage 1 through stage 4 chronic kidney disease, or unspecified chronic kidney disease: Secondary | ICD-10-CM | POA: Diagnosis not present

## 2022-07-03 DIAGNOSIS — M25552 Pain in left hip: Secondary | ICD-10-CM | POA: Diagnosis not present

## 2022-07-03 DIAGNOSIS — F419 Anxiety disorder, unspecified: Secondary | ICD-10-CM | POA: Diagnosis not present

## 2022-07-03 DIAGNOSIS — R0602 Shortness of breath: Secondary | ICD-10-CM | POA: Diagnosis not present

## 2022-07-03 DIAGNOSIS — I82B12 Acute embolism and thrombosis of left subclavian vein: Secondary | ICD-10-CM | POA: Diagnosis not present

## 2022-07-03 DIAGNOSIS — R413 Other amnesia: Secondary | ICD-10-CM | POA: Diagnosis not present

## 2022-07-03 DIAGNOSIS — N189 Chronic kidney disease, unspecified: Secondary | ICD-10-CM | POA: Diagnosis not present

## 2022-07-03 DIAGNOSIS — E7800 Pure hypercholesterolemia, unspecified: Secondary | ICD-10-CM | POA: Diagnosis not present

## 2022-07-03 DIAGNOSIS — I10 Essential (primary) hypertension: Secondary | ICD-10-CM | POA: Diagnosis not present

## 2022-07-03 DIAGNOSIS — Z720 Tobacco use: Secondary | ICD-10-CM | POA: Diagnosis not present

## 2022-07-03 DIAGNOSIS — D649 Anemia, unspecified: Secondary | ICD-10-CM | POA: Diagnosis not present

## 2022-07-03 DIAGNOSIS — J449 Chronic obstructive pulmonary disease, unspecified: Secondary | ICD-10-CM | POA: Diagnosis not present

## 2022-07-03 DIAGNOSIS — F439 Reaction to severe stress, unspecified: Secondary | ICD-10-CM | POA: Diagnosis not present

## 2022-07-03 DIAGNOSIS — R8290 Unspecified abnormal findings in urine: Secondary | ICD-10-CM | POA: Diagnosis not present

## 2022-07-03 DIAGNOSIS — Z96642 Presence of left artificial hip joint: Secondary | ICD-10-CM | POA: Diagnosis not present

## 2022-07-10 DIAGNOSIS — I5030 Unspecified diastolic (congestive) heart failure: Secondary | ICD-10-CM | POA: Diagnosis not present

## 2022-07-10 DIAGNOSIS — I130 Hypertensive heart and chronic kidney disease with heart failure and stage 1 through stage 4 chronic kidney disease, or unspecified chronic kidney disease: Secondary | ICD-10-CM | POA: Diagnosis not present

## 2022-07-10 DIAGNOSIS — E042 Nontoxic multinodular goiter: Secondary | ICD-10-CM | POA: Diagnosis not present

## 2022-07-10 DIAGNOSIS — F17210 Nicotine dependence, cigarettes, uncomplicated: Secondary | ICD-10-CM | POA: Diagnosis not present

## 2022-07-10 DIAGNOSIS — F329 Major depressive disorder, single episode, unspecified: Secondary | ICD-10-CM | POA: Diagnosis not present

## 2022-07-10 DIAGNOSIS — J449 Chronic obstructive pulmonary disease, unspecified: Secondary | ICD-10-CM | POA: Diagnosis not present

## 2022-07-10 DIAGNOSIS — Z0000 Encounter for general adult medical examination without abnormal findings: Secondary | ICD-10-CM | POA: Diagnosis not present

## 2022-07-10 DIAGNOSIS — R413 Other amnesia: Secondary | ICD-10-CM | POA: Diagnosis not present

## 2022-07-10 DIAGNOSIS — F411 Generalized anxiety disorder: Secondary | ICD-10-CM | POA: Diagnosis not present

## 2022-10-09 DIAGNOSIS — N189 Chronic kidney disease, unspecified: Secondary | ICD-10-CM | POA: Diagnosis not present

## 2022-10-09 DIAGNOSIS — I5030 Unspecified diastolic (congestive) heart failure: Secondary | ICD-10-CM | POA: Diagnosis not present

## 2022-10-09 DIAGNOSIS — R110 Nausea: Secondary | ICD-10-CM | POA: Diagnosis not present

## 2022-10-09 DIAGNOSIS — F411 Generalized anxiety disorder: Secondary | ICD-10-CM | POA: Diagnosis not present

## 2022-10-09 DIAGNOSIS — M79671 Pain in right foot: Secondary | ICD-10-CM | POA: Diagnosis not present

## 2022-10-09 DIAGNOSIS — I130 Hypertensive heart and chronic kidney disease with heart failure and stage 1 through stage 4 chronic kidney disease, or unspecified chronic kidney disease: Secondary | ICD-10-CM | POA: Diagnosis not present

## 2022-10-09 DIAGNOSIS — F329 Major depressive disorder, single episode, unspecified: Secondary | ICD-10-CM | POA: Diagnosis not present

## 2022-10-09 DIAGNOSIS — J449 Chronic obstructive pulmonary disease, unspecified: Secondary | ICD-10-CM | POA: Diagnosis not present

## 2022-10-09 DIAGNOSIS — R4182 Altered mental status, unspecified: Secondary | ICD-10-CM | POA: Diagnosis not present

## 2022-10-12 DIAGNOSIS — C689 Malignant neoplasm of urinary organ, unspecified: Secondary | ICD-10-CM | POA: Diagnosis not present

## 2022-10-12 DIAGNOSIS — D735 Infarction of spleen: Secondary | ICD-10-CM | POA: Diagnosis not present

## 2022-10-12 DIAGNOSIS — F410 Panic disorder [episodic paroxysmal anxiety] without agoraphobia: Secondary | ICD-10-CM | POA: Diagnosis not present

## 2022-10-12 DIAGNOSIS — R413 Other amnesia: Secondary | ICD-10-CM | POA: Diagnosis not present

## 2022-10-12 DIAGNOSIS — N329 Bladder disorder, unspecified: Secondary | ICD-10-CM | POA: Diagnosis not present

## 2022-10-12 DIAGNOSIS — J4530 Mild persistent asthma, uncomplicated: Secondary | ICD-10-CM | POA: Diagnosis not present

## 2022-10-12 DIAGNOSIS — N1330 Unspecified hydronephrosis: Secondary | ICD-10-CM | POA: Diagnosis not present

## 2022-10-12 DIAGNOSIS — F3340 Major depressive disorder, recurrent, in remission, unspecified: Secondary | ICD-10-CM | POA: Diagnosis not present

## 2022-10-12 DIAGNOSIS — I10 Essential (primary) hypertension: Secondary | ICD-10-CM | POA: Diagnosis not present

## 2022-10-12 DIAGNOSIS — C674 Malignant neoplasm of posterior wall of bladder: Secondary | ICD-10-CM | POA: Diagnosis not present

## 2022-10-13 DIAGNOSIS — C689 Malignant neoplasm of urinary organ, unspecified: Secondary | ICD-10-CM | POA: Diagnosis not present

## 2022-10-14 ENCOUNTER — Other Ambulatory Visit: Payer: Self-pay | Admitting: Internal Medicine

## 2022-10-14 DIAGNOSIS — R4182 Altered mental status, unspecified: Secondary | ICD-10-CM

## 2022-11-10 ENCOUNTER — Ambulatory Visit: Payer: Medicare HMO | Attending: Medical | Admitting: Medical

## 2022-11-10 NOTE — Progress Notes (Deleted)
Cardiology Office Note:    Date:  11/10/2022   ID:  Storm Frisk, DOB [birth year], MRN [id number]  PCP:  Barbette Reichmann, MD  [facility] Cardiologist:  Julien Nordmann, MD  [facility] Electrophysiologist:  None   Referring MD: Barbette Reichmann, MD   Chief Complaint: 1 year follow-up  History of Present Illness:    Pamela James is a 77 y.o. 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    She was admitted 09/15/2019-09/27/19 to [facility] after an episode of syncope with bradycardia that resulted in CPR. Admission complicated by aspiration pneumonia with Unasyn, pulmonary edema requiring IV Lasix, left subclavian clot, hypertensive urgency, ?alcohol withdrawal, metabolic encephalopathy, and bradycardia/hypotension. She was intubated and admitted to ICU .  She was hypotensive and bradycardic, requiring Precedex and dopamine drips.  She was started on Eliquis for left subclavian clot.  She was extubated and transferred out of the ICU; however, due to hypertensive urgency, she required transfer back to the ICU for nicardipine drip.  She received IV lasix for pulmonary vascular congestion with improvement. She was discharged home to live with her son 09/27/2019.   09/16/2019 ultrasound of the upper left extremity showed partially occluded thrombus of left subclavian vein.  She also has occlusive thrombus of the axillary vein that is noncompressible, occlusive thrombus of the cephalic vein, which is also noncompressible, occlusive thrombus of the brachial veins, occlusive thrombus of the radial veins, occlusive thrombus of the ulnar veins.  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   Occlusive of the axillary paired brachial veins, and nonocclusive at the subclavian vein.  Superficial occlusive thrombophlebitis of the cephalic vein, basilic vein, radial vein, and ulnar vein.   On 11/09/2019, she presented to the emergency department and found to be hypoxic, volume overloaded, and hyperkalemic with ARF and severe left hydronephrosis.  CT showed new moderately severe left hydronephrosis  with mid and distal left ureter showing possible hemorrhage by tumor within the ureter. Noted was small bilateral pleural effusions and improved bibasilar airspace disease and right renal scarring with 3 nonobstructing stones.  At presentation, intermittent bradycardia was noted with atropine administered.  Junctional rhythm at 46 bpm noted and thought due to to electrolyte abnormality. With improved electrolytes (hyponatremia, hyperkalemia), repeat EKG showed improved rate and resolution of junctional rhythm.     Limited echo was performed due to electrical alternans on telemetry and echo as below and showed EF 60 to 65%, mild LVH. PRN lasix was provided for wt gain.    She was seen by the heart failure clinic 11/23/2019.  At that time, she reported minimal DOE with palpitations.  Noted was to possibly change lisinopril to [facility] in the future.  She had recently stopped smoking.    On 12/15/2019, she presented to the emergency department after [facility] reportedly took labs and found potassium significantly elevated at 7.8, and once labs were rechecked per our request on 9/29. Recheck showed K 4.6.  It was noted that it was unclear whether or not the value via home health was a true value or 2/2 pseudohyperkalemia but had self resolved.  Patient was last seen in March 2023 and was overall stable from a cardiac perspective.  Today,    Past  Medical History:  Diagnosis Date   Anxiety    Asthma    CHF (congestive heart failure) ([facility])    COPD (chronic obstructive pulmonary disease) ([facility])     Depression    GERD (gastroesophageal reflux disease)    Hypertension     Past Surgical History:  Procedure Laterality Date   ABDOMINAL HYSTERECTOMY     COLONOSCOPY WITH PROPOFOL N/A 05/26/2021   Procedure: COLONOSCOPY WITH PROPOFOL;  Surgeon: Regis Bill, MD;  Location: [facility] ENDOSCOPY;  Service: Endoscopy;  Laterality: N/A;  PHONE # IS SON'S # (HE IS THE CAREGIVER)   TOTAL HIP ARTHROPLASTY Left    TUBAL LIGATION      Current Medications: No outpatient medications have been marked as taking for the 11/10/22 encounter (Appointment) with Fransico Michael, Sharlena Kristensen H, PA-C.     Allergies:   Morphine and codeine, Nsaids, and Aspirin   Social History   Socioeconomic History   Marital status: Widowed    Spouse name: Not on file   Number of children: Not on file   Years of education: Not on file   Highest education level: Not on file  Occupational History   Occupation: retired  Tobacco Use   Smoking status: Some Days    Current packs/day: 0.75    Average packs/day: 0.8 packs/day for 40.0 years (30.0 ttl pk-yrs)    Types: Cigarettes   Smokeless tobacco: Never  Vaping Use   Vaping status: Never Used  Substance and Sexual Activity   Alcohol use: No   Drug use: No   Sexual activity: Not on file  Other Topics Concern   Not on file  Social History Narrative   Not on file   Social Determinants of Health   Financial Resource Strain: Low Risk  (07/10/2022)   Received from [facility]   Overall Financial Resource Strain (CARDIA)    Difficulty of Paying Living Expenses: Not hard at all  Food Insecurity: No Food Insecurity (07/10/2022)   Received from [facility]   Hunger Vital Sign    Worried About Running Out of Food in the Last Year: Never true    Ran Out of Food in the Last Year: Never true  Transportation Needs: No Transportation Needs (07/10/2022)   Received from [facility] - Transportation    In the past 12  months, has lack of transportation kept you from medical appointments or from getting medications?: No    Lack of Transportation (Non-Medical): No  Physical Activity: Inactive (07/11/2021)   Received from [facility]   Exercise Vital Sign    Days of Exercise per Week: 0 days    Minutes of Exercise per Session: 0 min  Stress: Not on file  Social Connections: Socially Isolated (07/11/2021)   Received from [facility]   Social Connection and Isolation Panel [NHANES]    Frequency of Communication with Friends and Family: More than three times a week    Frequency of Social Gatherings with Friends and Family: Once a week    Attends Religious Services: Never    Database administrator or Organizations: No    Attends Banker Meetings: Never    Marital Status: Widowed     Family History: The patient's ***family history includes Cancer in her brother; Heart disease in her father and sister.  ROS:   Please see the history of present illness.    *** All other systems reviewed and are  negative.  EKGs/Labs/Other Studies Reviewed:    The following studies were reviewed today: ***  EKG:  EKG is *** ordered today.  The ekg ordered today demonstrates ***  Recent Labs: No results found for requested labs within last 365 days.  Recent Lipid Panel    Component Value Date/Time   CHOL 139 03/25/2016 0341   TRIG 114 09/18/2019 0913   HDL 46 03/25/2016 0341   CHOLHDL 3.0 03/25/2016 0341   VLDL 17 03/25/2016 0341   LDLCALC 76 03/25/2016 0341   LDLDIRECT 67 12/07/2019 1301     Risk Assessment/Calculations:   {Does this patient have ATRIAL FIBRILLATION?:[phone]}   Physical Exam:    VS:  There were no vitals taken for this visit.    Wt Readings from Last 3 Encounters:  05/26/21 163 lb (73.9 kg)  05/23/21 168 lb 6 oz (76.4 kg)  05/21/21 167 lb (75.8 kg)     GEN: *** Well nourished, well developed in no acute distress HEENT:  Normal NECK: No JVD; No carotid bruits LYMPHATICS: No lymphadenopathy CARDIAC: ***RRR, no murmurs, rubs, gallops RESPIRATORY:  Clear to auscultation without rales, wheezing or rhonchi  ABDOMEN: Soft, non-tender, non-distended MUSCULOSKELETAL:  No edema; No deformity  SKIN: Warm and dry NEUROLOGIC:  Alert and oriented x 3 PSYCHIATRIC:  Normal affect   ASSESSMENT:    No diagnosis found. PLAN:    In order of problems listed above:  ***  Disposition: Follow up {follow up:15908} with ***   Shared Decision Making/Informed Consent   {Are you ordering a CV Procedure (e.g. stress test, cath, DCCV, TEE, etc)?   Press F2        :[id number]}    Signed, Pistol Kessenich Ardelle Lesches  11/10/2022 8:47 AM    [facility]

## 2022-11-11 ENCOUNTER — Encounter: Payer: Self-pay | Admitting: Medical

## 2022-11-12 ENCOUNTER — Encounter: Payer: Self-pay | Admitting: Emergency Medicine

## 2022-11-12 ENCOUNTER — Emergency Department: Payer: Medicare HMO

## 2022-11-12 ENCOUNTER — Inpatient Hospital Stay
Admission: EM | Admit: 2022-11-12 | Discharge: 2022-11-17 | DRG: 078 | Disposition: A | Discharge status: Home Health | Payer: Medicare HMO | Attending: Internal Medicine | Admitting: Internal Medicine

## 2022-11-12 ENCOUNTER — Other Ambulatory Visit: Payer: Self-pay

## 2022-11-12 DIAGNOSIS — J4489 Other specified chronic obstructive pulmonary disease: Secondary | ICD-10-CM | POA: Diagnosis present

## 2022-11-12 DIAGNOSIS — Z809 Family history of malignant neoplasm, unspecified: Secondary | ICD-10-CM

## 2022-11-12 DIAGNOSIS — Z1152 Encounter for screening for COVID-19: Secondary | ICD-10-CM | POA: Diagnosis not present

## 2022-11-12 DIAGNOSIS — Z8550 Personal history of malignant neoplasm of unspecified urinary tract organ: Secondary | ICD-10-CM | POA: Diagnosis not present

## 2022-11-12 DIAGNOSIS — I130 Hypertensive heart and chronic kidney disease with heart failure and stage 1 through stage 4 chronic kidney disease, or unspecified chronic kidney disease: Secondary | ICD-10-CM | POA: Diagnosis present

## 2022-11-12 DIAGNOSIS — C689 Malignant neoplasm of urinary organ, unspecified: Secondary | ICD-10-CM | POA: Insufficient documentation

## 2022-11-12 DIAGNOSIS — R42 Dizziness and giddiness: Secondary | ICD-10-CM

## 2022-11-12 DIAGNOSIS — I161 Hypertensive emergency: Secondary | ICD-10-CM | POA: Diagnosis present

## 2022-11-12 DIAGNOSIS — I499 Cardiac arrhythmia, unspecified: Secondary | ICD-10-CM | POA: Diagnosis not present

## 2022-11-12 DIAGNOSIS — Z96642 Presence of left artificial hip joint: Secondary | ICD-10-CM | POA: Diagnosis present

## 2022-11-12 DIAGNOSIS — Z7901 Long term (current) use of anticoagulants: Secondary | ICD-10-CM

## 2022-11-12 DIAGNOSIS — Z886 Allergy status to analgesic agent status: Secondary | ICD-10-CM | POA: Diagnosis not present

## 2022-11-12 DIAGNOSIS — F17210 Nicotine dependence, cigarettes, uncomplicated: Secondary | ICD-10-CM | POA: Diagnosis present

## 2022-11-12 DIAGNOSIS — K219 Gastro-esophageal reflux disease without esophagitis: Secondary | ICD-10-CM | POA: Diagnosis present

## 2022-11-12 DIAGNOSIS — F319 Bipolar disorder, unspecified: Secondary | ICD-10-CM | POA: Diagnosis present

## 2022-11-12 DIAGNOSIS — N1832 Chronic kidney disease, stage 3b: Secondary | ICD-10-CM | POA: Diagnosis present

## 2022-11-12 DIAGNOSIS — Z20822 Contact with and (suspected) exposure to covid-19: Secondary | ICD-10-CM | POA: Diagnosis present

## 2022-11-12 DIAGNOSIS — I5032 Chronic diastolic (congestive) heart failure: Secondary | ICD-10-CM | POA: Diagnosis present

## 2022-11-12 DIAGNOSIS — R531 Weakness: Principal | ICD-10-CM

## 2022-11-12 DIAGNOSIS — Z7983 Long term (current) use of bisphosphonates: Secondary | ICD-10-CM

## 2022-11-12 DIAGNOSIS — N390 Urinary tract infection, site not specified: Secondary | ICD-10-CM | POA: Diagnosis present

## 2022-11-12 DIAGNOSIS — Z86718 Personal history of other venous thrombosis and embolism: Secondary | ICD-10-CM

## 2022-11-12 DIAGNOSIS — R3129 Other microscopic hematuria: Secondary | ICD-10-CM | POA: Diagnosis present

## 2022-11-12 DIAGNOSIS — Z90710 Acquired absence of both cervix and uterus: Secondary | ICD-10-CM

## 2022-11-12 DIAGNOSIS — I700 Atherosclerosis of aorta: Secondary | ICD-10-CM | POA: Diagnosis not present

## 2022-11-12 DIAGNOSIS — Z885 Allergy status to narcotic agent status: Secondary | ICD-10-CM

## 2022-11-12 DIAGNOSIS — J449 Chronic obstructive pulmonary disease, unspecified: Secondary | ICD-10-CM | POA: Diagnosis present

## 2022-11-12 DIAGNOSIS — D472 Monoclonal gammopathy: Secondary | ICD-10-CM | POA: Diagnosis present

## 2022-11-12 DIAGNOSIS — Z8249 Family history of ischemic heart disease and other diseases of the circulatory system: Secondary | ICD-10-CM

## 2022-11-12 DIAGNOSIS — Z79899 Other long term (current) drug therapy: Secondary | ICD-10-CM

## 2022-11-12 DIAGNOSIS — R410 Disorientation, unspecified: Secondary | ICD-10-CM

## 2022-11-12 DIAGNOSIS — F419 Anxiety disorder, unspecified: Secondary | ICD-10-CM | POA: Diagnosis present

## 2022-11-12 DIAGNOSIS — I674 Hypertensive encephalopathy: Secondary | ICD-10-CM | POA: Diagnosis present

## 2022-11-12 DIAGNOSIS — I10 Essential (primary) hypertension: Secondary | ICD-10-CM | POA: Diagnosis not present

## 2022-11-12 DIAGNOSIS — C679 Malignant neoplasm of bladder, unspecified: Secondary | ICD-10-CM | POA: Diagnosis not present

## 2022-11-12 DIAGNOSIS — R55 Syncope and collapse: Secondary | ICD-10-CM | POA: Diagnosis not present

## 2022-11-12 DIAGNOSIS — R4182 Altered mental status, unspecified: Secondary | ICD-10-CM | POA: Diagnosis not present

## 2022-11-12 LAB — URINALYSIS, COMPLETE (UACMP) WITH MICROSCOPIC
Bacteria, UA: NONE SEEN
Bilirubin Urine: NEGATIVE
Glucose, UA: NEGATIVE mg/dL
Ketones, ur: NEGATIVE mg/dL
Leukocytes,Ua: NEGATIVE
Nitrite: NEGATIVE
Protein, ur: 30 mg/dL — AB
RBC / HPF: >50 RBC/hpf (ref 0–5)
Specific Gravity, Urine: 1.013 (ref 1.005–1.030)
pH: 8 (ref 5.0–8.0)

## 2022-11-12 LAB — CBC
HCT: 32.6 % — ABNORMAL LOW (ref 36.0–46.0)
Hemoglobin: 10.9 g/dL — ABNORMAL LOW (ref 12.0–15.0)
MCH: 31.2 pg (ref 26.0–34.0)
MCHC: 33.4 g/dL (ref 30.0–36.0)
MCV: 93.4 fL (ref 80.0–100.0)
Platelets: 536 10*3/uL — ABNORMAL HIGH (ref 150–400)
RBC: 3.49 MIL/uL — ABNORMAL LOW (ref 3.87–5.11)
RDW: 17.2 % — ABNORMAL HIGH (ref 11.5–15.5)
WBC: 7.8 10*3/uL (ref 4.0–10.5)
nRBC: 0 % (ref 0.0–0.2)

## 2022-11-12 LAB — COMPREHENSIVE METABOLIC PANEL
ALT: 10 U/L (ref 0–44)
AST: 14 U/L — ABNORMAL LOW (ref 15–41)
Albumin: 4.1 g/dL (ref 3.5–5.0)
Alkaline Phosphatase: 67 U/L (ref 38–126)
Anion gap: 11 (ref 5–15)
BUN: 17 mg/dL (ref 8–23)
CO2: 24 mmol/L (ref 22–32)
Calcium: 9.6 mg/dL (ref 8.9–10.3)
Chloride: 107 mmol/L (ref 98–111)
Creatinine, Ser: 1.4 mg/dL — ABNORMAL HIGH (ref 0.44–1.00)
GFR, Estimated: 39 mL/min — ABNORMAL LOW (ref >60)
Glucose, Bld: 90 mg/dL (ref 70–99)
Potassium: 4.5 mmol/L (ref 3.5–5.1)
Sodium: 142 mmol/L (ref 135–145)
Total Bilirubin: 0.9 mg/dL (ref 0.3–1.2)
Total Protein: 6.7 g/dL (ref 6.5–8.1)

## 2022-11-12 LAB — TROPONIN I (HIGH SENSITIVITY)
Troponin I (High Sensitivity): 11 ng/L (ref <18)
Troponin I (High Sensitivity): 15 ng/L (ref <18)

## 2022-11-12 LAB — SARS CORONAVIRUS 2 BY RT PCR: SARS Coronavirus 2 by RT PCR: NEGATIVE

## 2022-11-12 MED ORDER — LABETALOL HCL 5 MG/ML IV SOLN
10.0000 mg | Freq: Once | INTRAVENOUS | Status: AC
  Administered 2022-11-12: 10 mg via INTRAVENOUS
  Filled 2022-11-12: qty 4

## 2022-11-12 MED ORDER — NICARDIPINE HCL IN NACL 20-0.86 MG/200ML-% IV SOLN
3.0000 mg/h | INTRAVENOUS | Status: DC
  Administered 2022-11-12: 5 mg/h via INTRAVENOUS
  Filled 2022-11-12: qty 200

## 2022-11-12 MED ORDER — SODIUM CHLORIDE 0.9 % IV SOLN
1.0000 g | Freq: Once | INTRAVENOUS | Status: AC
  Administered 2022-11-12: 1 g via INTRAVENOUS
  Filled 2022-11-12: qty 10

## 2022-11-12 NOTE — ED Notes (Signed)
Patient given two warm blankets   

## 2022-11-12 NOTE — Progress Notes (Signed)
11/08/22: [facility] ED RN Hospital Liaison for [facility] evaluated/screened patient chart for potential or prior engagements pending disposition. PCP at [facility].   Gabriel Cirri RN MSN  [facility], [facility] Title: ED RN Liaison Email: [email or web address] Direct Dial: Teams chat or secure chat                        Website: [email or web address]     Note: [facility] ED liaison does not counter or interfere with Inpatient Transitions of Care discharge planning or disposition.

## 2022-11-12 NOTE — ED Provider Notes (Addendum)
[facility] Provider Note    Event Date/Time   First MD Initiated Contact with Patient 11/12/22 1713     (approximate)  History   Chief Complaint: Weakness  HPI  Pamela James is a 77 y.o. female with a past medical history of Zaidi, asthma, CHF, COPD, gastric reflux, hypertension, presents emergency department for generalized weakness fatigue and confusion.  According to EMS report family called as the patient has been extremely weak all day today and had a near syncopal episode.  Patient states she has not been feeling well today describes generalized weakness and a sensation of "something is wrong."  Patient denies any cough congestion denies any chest pain or shortness of breath.  Patient is significantly hypertensive 235/76, family is unsure if the patient took her morning medications.  Patient states she did but she does seem confused at times.  Per EMS report son recently tested positive for COVID.  Physical Exam   Triage Vital Signs: ED Triage Vitals  Encounter Vitals Group     BP 11/12/22 1719 (!) 235/76     Systolic BP Percentile --      Diastolic BP Percentile --      Pulse Rate 11/12/22 1719 79     Resp 11/12/22 1719 (!) 25     Temp 11/12/22 1723 98.2 F (36.8 C)     Temp Source 11/12/22 1723 Oral     SpO2 11/12/22 1714 96 %     Weight 11/12/22 1720 162 lb 14.7 oz (73.9 kg)     Height 11/12/22 1720 5\' 3"  (1.6 m)     Head Circumference --      Peak Flow --      Pain Score 11/12/22 1719 0     Pain Loc --      Pain Education --      Exclude from Growth Chart --     Most recent vital signs: Vitals:   11/12/22 1719 11/12/22 1723  BP: (!) 235/76   Pulse: 79   Resp: (!) 25   Temp:  98.2 F (36.8 C)  SpO2: 96%     General: Awake, no distress.  CV:  Good peripheral perfusion.  Regular rate and rhythm  Resp:  Normal effort.  Equal breath sounds bilaterally.  Abd:  No distention.  Soft, nontender.  No rebound or guarding.  ED Results /  Procedures / Treatments   EKG  EKG viewed and interpreted by myself shows a sinus rhythm at 70 bpm with a narrow QRS, normal axis, normal intervals, no concerning ST changes.  RADIOLOGY  I have reviewed and interpreted CT head images.  No bleed seen on my evaluation. Radiologist read the CT scan is negative.   MEDICATIONS ORDERED IN ED: Medications - No data to display   IMPRESSION / MDM / ASSESSMENT AND PLAN / ED COURSE  I reviewed the triage vital signs and the nursing notes.  Patient's presentation is most consistent with acute presentation with potential threat to life or bodily function.  Patient presents emergency department for confusion generalized weakness fatigue and not feeling well.  Patient is considerably hypertensive.  Given the patient's vague symptoms although generalized weakness we will obtain a CT scan of the head.  Denies any focal weakness or deficits and none seen on examination.  We will check labs chest x-ray and a COVID swab.  Will continue to closely monitor while awaiting results.  Patient agreeable to plan of care.  Patient's lab work  shows a reassuring CBC with mild anemia, urinalysis does show white cells and red cells.  COVID test is negative.  We will cover with antibiotics for possible urinary tract infection and send a urine culture.  We will dose IV labetalol for the patient's blood pressure.  Patient remains significantly hypertensive 234/54.  Patient's lab work has resulted showing a reassuring CBC, chemistry negative troponin.  Patient's COVID test is negative.  Urinalysis does show red cells and white cells will cover with antibiotics as a precaution.  CT scan head shows no acute finding.  Given the patient's significant weakness, daughter is now here who confirms the patient has been very weak and has been somnolent almost confused at times today in addition to her severe hypertension has not responded to now 2 doses of IV labetalol we will start the  patient on a Cardene infusion and admit to the hospitalist service.  CRITICAL CARE Performed by: Minna Antis   Total critical care time: 30 minutes  Critical care time was exclusive of separately billable procedures and treating other patients.  Critical care was necessary to treat or prevent imminent or life-threatening deterioration.  Critical care was time spent personally by me on the following activities: development of treatment plan with patient and/or surrogate as well as nursing, discussions with consultants, evaluation of patient's response to treatment, examination of patient, obtaining history from patient or surrogate, ordering and performing treatments and interventions, ordering and review of laboratory studies, ordering and review of radiographic studies, pulse oximetry and re-evaluation of patient's condition.   FINAL CLINICAL IMPRESSION(S) / ED DIAGNOSES   Generalized weakness Confusion Hypertensive emergency Urinary tract infection  Note:  This document was prepared using Dragon voice recognition software and may include unintentional dictation errors.   Minna Antis, MD 11/12/22 7829    Minna Antis, MD 11/12/22 [phone]

## 2022-11-12 NOTE — ED Triage Notes (Signed)
Pt to ED via [facility]. Pts family called EMS due to pt having generalized weakness and a near syncopal episode. Pt has bladder cancer. Pt is not on chem. Pts son is positive for COVID. Pt states that she was having some dizziness earlier but that has resolved. Pt hypertensive with EMS with hx/o same. Pt family is unsure if she has taken her BP meds today.

## 2022-11-13 ENCOUNTER — Inpatient Hospital Stay: Payer: Medicare HMO

## 2022-11-13 ENCOUNTER — Inpatient Hospital Stay
Admit: 2022-11-13 | Discharge: 2022-11-13 | Disposition: A | Discharge status: Home / Self Care | Payer: Medicare HMO | Attending: Internal Medicine | Admitting: Internal Medicine

## 2022-11-13 ENCOUNTER — Encounter: Payer: Self-pay | Admitting: Internal Medicine

## 2022-11-13 DIAGNOSIS — I674 Hypertensive encephalopathy: Secondary | ICD-10-CM | POA: Diagnosis not present

## 2022-11-13 DIAGNOSIS — Z86718 Personal history of other venous thrombosis and embolism: Secondary | ICD-10-CM

## 2022-11-13 DIAGNOSIS — I5032 Chronic diastolic (congestive) heart failure: Secondary | ICD-10-CM | POA: Insufficient documentation

## 2022-11-13 DIAGNOSIS — C689 Malignant neoplasm of urinary organ, unspecified: Secondary | ICD-10-CM | POA: Insufficient documentation

## 2022-11-13 DIAGNOSIS — R42 Dizziness and giddiness: Secondary | ICD-10-CM

## 2022-11-13 DIAGNOSIS — F419 Anxiety disorder, unspecified: Secondary | ICD-10-CM | POA: Diagnosis present

## 2022-11-13 LAB — CBC
HCT: 31.6 % — ABNORMAL LOW (ref 36.0–46.0)
Hemoglobin: 10.7 g/dL — ABNORMAL LOW (ref 12.0–15.0)
MCH: 31.6 pg (ref 26.0–34.0)
MCHC: 33.9 g/dL (ref 30.0–36.0)
MCV: 93.2 fL (ref 80.0–100.0)
Platelets: 565 10*3/uL — ABNORMAL HIGH (ref 150–400)
RBC: 3.39 MIL/uL — ABNORMAL LOW (ref 3.87–5.11)
RDW: 16.8 % — ABNORMAL HIGH (ref 11.5–15.5)
WBC: 9.5 10*3/uL (ref 4.0–10.5)
nRBC: 0 % (ref 0.0–0.2)

## 2022-11-13 LAB — URINE CULTURE: Culture: <10000 — AB

## 2022-11-13 LAB — CREATININE, SERUM
Creatinine, Ser: 1.39 mg/dL — ABNORMAL HIGH (ref 0.44–1.00)
GFR, Estimated: 39 mL/min — ABNORMAL LOW (ref >60)

## 2022-11-13 MED ORDER — ALPRAZOLAM 0.25 MG PO TABS
0.2500 mg | ORAL_TABLET | Freq: Every day | ORAL | Status: DC | PRN
  Administered 2022-11-13 – 2022-11-15 (×3): 0.25 mg via ORAL
  Filled 2022-11-13 (×3): qty 1

## 2022-11-13 MED ORDER — ACETAMINOPHEN 325 MG PO TABS
650.0000 mg | ORAL_TABLET | Freq: Four times a day (QID) | ORAL | Status: DC | PRN
  Administered 2022-11-13 – 2022-11-15 (×2): 650 mg via ORAL
  Filled 2022-11-13 (×2): qty 2

## 2022-11-13 MED ORDER — MELATONIN 5 MG PO TABS
2.5000 mg | ORAL_TABLET | Freq: Every day | ORAL | Status: DC
  Administered 2022-11-13 – 2022-11-16 (×4): 2.5 mg via ORAL
  Filled 2022-11-13 (×4): qty 1

## 2022-11-13 MED ORDER — ONDANSETRON HCL 4 MG PO TABS
4.0000 mg | ORAL_TABLET | Freq: Four times a day (QID) | ORAL | Status: DC | PRN
  Administered 2022-11-13: 4 mg via ORAL
  Filled 2022-11-13: qty 1

## 2022-11-13 MED ORDER — FUROSEMIDE 20 MG PO TABS
20.0000 mg | ORAL_TABLET | ORAL | Status: DC
  Administered 2022-11-15 – 2022-11-17 (×2): 20 mg via ORAL
  Filled 2022-11-13 (×4): qty 1

## 2022-11-13 MED ORDER — PANTOPRAZOLE SODIUM 40 MG PO TBEC
40.0000 mg | DELAYED_RELEASE_TABLET | Freq: Every day | ORAL | Status: DC
  Administered 2022-11-13 – 2022-11-17 (×5): 40 mg via ORAL
  Filled 2022-11-13 (×5): qty 1

## 2022-11-13 MED ORDER — ACETAMINOPHEN 650 MG RE SUPP: 650.0000 mg | Freq: Four times a day (QID) | RECTAL | Status: DC | PRN

## 2022-11-13 MED ORDER — AMLODIPINE BESYLATE 5 MG PO TABS
5.0000 mg | ORAL_TABLET | Freq: Every day | ORAL | Status: DC
  Administered 2022-11-13: 5 mg via ORAL
  Filled 2022-11-13: qty 1

## 2022-11-13 MED ORDER — MEMANTINE HCL 10 MG PO TABS
5.0000 mg | ORAL_TABLET | Freq: Two times a day (BID) | ORAL | Status: DC
  Administered 2022-11-13 – 2022-11-17 (×9): 5 mg via ORAL
  Filled 2022-11-13 (×9): qty 1

## 2022-11-13 MED ORDER — UMECLIDINIUM BROMIDE 62.5 MCG/ACT IN AEPB
1.0000 | INHALATION_SPRAY | Freq: Every day | RESPIRATORY_TRACT | Status: DC
  Administered 2022-11-13 – 2022-11-17 (×5): 1 via RESPIRATORY_TRACT
  Filled 2022-11-13: qty 7

## 2022-11-13 MED ORDER — CITALOPRAM HYDROBROMIDE 20 MG PO TABS
10.0000 mg | ORAL_TABLET | Freq: Every day | ORAL | Status: DC
  Administered 2022-11-13 – 2022-11-17 (×5): 10 mg via ORAL
  Filled 2022-11-13 (×5): qty 1

## 2022-11-13 MED ORDER — ONDANSETRON HCL 4 MG/2ML IJ SOLN
4.0000 mg | Freq: Four times a day (QID) | INTRAMUSCULAR | Status: DC | PRN
  Filled 2022-11-13: qty 2

## 2022-11-13 MED ORDER — APIXABAN 5 MG PO TABS
5.0000 mg | ORAL_TABLET | Freq: Two times a day (BID) | ORAL | Status: DC
  Administered 2022-11-13 – 2022-11-17 (×8): 5 mg via ORAL
  Filled 2022-11-13 (×8): qty 1

## 2022-11-13 MED ORDER — FLUTICASONE FUROATE-VILANTEROL 100-25 MCG/ACT IN AEPB
1.0000 | INHALATION_SPRAY | Freq: Every day | RESPIRATORY_TRACT | Status: DC
  Administered 2022-11-13 – 2022-11-17 (×5): 1 via RESPIRATORY_TRACT
  Filled 2022-11-13: qty 28

## 2022-11-13 MED ORDER — ALBUTEROL SULFATE (2.5 MG/3ML) 0.083% IN NEBU
2.5000 mg | INHALATION_SOLUTION | RESPIRATORY_TRACT | Status: DC | PRN
  Administered 2022-11-13: 2.5 mg via RESPIRATORY_TRACT
  Filled 2022-11-13: qty 3

## 2022-11-13 MED ORDER — METOCLOPRAMIDE HCL 5 MG/ML IJ SOLN
10.0000 mg | Freq: Once | INTRAMUSCULAR | Status: AC
  Administered 2022-11-13: 10 mg via INTRAVENOUS
  Filled 2022-11-13: qty 2

## 2022-11-13 MED ORDER — SODIUM CHLORIDE 0.9% FLUSH
3.0000 mL | Freq: Two times a day (BID) | INTRAVENOUS | Status: DC
  Administered 2022-11-13 – 2022-11-17 (×10): 3 mL via INTRAVENOUS

## 2022-11-13 MED ORDER — ENOXAPARIN SODIUM 40 MG/0.4ML IJ SOSY
40.0000 mg | PREFILLED_SYRINGE | INTRAMUSCULAR | Status: DC
  Administered 2022-11-13: 40 mg via SUBCUTANEOUS
  Filled 2022-11-13: qty 0.4

## 2022-11-13 MED ORDER — HYDRALAZINE HCL 25 MG PO TABS
25.0000 mg | ORAL_TABLET | Freq: Three times a day (TID) | ORAL | Status: DC
  Administered 2022-11-13 (×4): 25 mg via ORAL
  Filled 2022-11-13 (×4): qty 1

## 2022-11-13 NOTE — ED Notes (Addendum)
Patient transported to MRI 

## 2022-11-13 NOTE — Evaluation (Signed)
Physical Therapy Evaluation Patient Details Name: Pamela James MRN: [id number] DOB: Apr 27, 1945 Today's Date: 11/13/2022  History of Present Illness  Pamela James is a 77 y.o. 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  History was provided to the ED provider by daughter who states that she had to be helped to get out of bed which is not usual for her.  Patient was unable to describe what she was feeling.  She denied cough or congestion or chest pain, nausea or vomiting or abdominal pain, diarrhea or dysuria.   Clinical Impression  Pt admitted with above diagnosis. Pt currently with functional limitations due to the deficits listed below (see PT Problem List). Pt received supine in bed sleeping. Pt awakens to voice and agreeable to PT. PT is A&Ox4 reporting being indep with gait in household and mod-I with SPC with short community distances. Family assists with IADL's.   To date, pt is supervision for all activity from bed mobility, STS, stand <> sit, and gait ~60'. Pt with mild gait deviations with variable step lengths and mild stepping strategy laterally with min SUE support on railing in hallway. Pt able to self correct all mild balance losses. Pt mildly SOB but SPO2 and HR are WNL. Pt returning supine in bed with all needs in reach. Discussed benefits of HHPT versus OPPT. Pt close to baseline but will continue to benefit from acute PT to progress balance and functional mobility upon discharge.      If plan is discharge home, recommend the following: Assistance with cooking/housework;Assist for transportation;Help with stairs or ramp for entrance;A little help with bathing/dressing/bathroom   Can travel by private vehicle         Equipment Recommendations None recommended by PT  Recommendations for Other Services       Functional Status Assessment Patient has had a recent decline in their functional status and demonstrates the ability to make significant improvements in function in a reasonable and predictable amount of time.     Precautions / Restrictions Precautions Precautions: Fall Restrictions Weight Bearing Restrictions: No      Mobility  Bed Mobility Overal bed mobility: Needs Assistance Bed Mobility: Supine to Sit     Supine to sit: Supervision       Patient Response: Cooperative  Transfers Overall transfer level: Needs assistance Equipment used: None Transfers: Sit to/from Stand Sit to Stand: Contact guard assist                Ambulation/Gait Ambulation/Gait assistance: Supervision Gait Distance (Feet): 60 Feet Assistive device: None Gait Pattern/deviations: Step-through pattern       General Gait Details: variable step lengths with occasional shuffling gait. Mildly unsteady.  Stairs            Wheelchair Mobility     Tilt Bed Tilt Bed Patient Response: Cooperative  Modified Rankin (Stroke Patients Only)       Balance Overall balance assessment: Needs assistance Sitting-balance support: Bilateral upper extremity supported, No upper extremity supported Sitting balance-Leahy Scale: Good     Standing balance support: No upper extremity supported, During functional activity Standing balance-Leahy Scale: Good  Pertinent Vitals/Pain Pain Assessment Pain Assessment: Faces Faces Pain Scale: No hurt    Home Living Family/patient expects to be discharged to:: Private residence Living Arrangements: Children;Non-relatives/Friends (son's significant other and grand kids) Available Help at Discharge: Family;Available PRN/intermittently Type of Home: House Home Access: Stairs to enter Entrance Stairs-Rails:  Doctor, general practice of Steps: 3   Home Layout: One level Home Equipment: Agricultural consultant (2 wheels);Cane - single point      Prior Function Prior Level of Function : Independent/Modified Independent;Needs assist;Working/employed;Driving       Physical Assist : Mobility (physical);ADLs (physical)   ADLs (physical): IADLs Mobility Comments: Uses SPC for short community distances. Family assists with driving and household upkeep       Extremity/Trunk Assessment   Upper Extremity Assessment Upper Extremity Assessment: Defer to OT evaluation    Lower Extremity Assessment Lower Extremity Assessment: Generalized weakness       Communication   Communication Communication: No apparent difficulties Cueing Techniques: Verbal cues  Cognition Arousal: Alert Behavior During Therapy: WFL for tasks assessed/performed Overall Cognitive Status: Within Functional Limits for tasks assessed                                 General Comments: A&Ox4        General Comments General comments (skin integrity, edema, etc.): HR: 100 BPM, SPO2 90%    Exercises Other Exercises Other Exercises: Role of PT in acute setting, d/c recs (OPPT versus HHPT).   Assessment/Plan    PT Assessment Patient needs continued PT services  PT Problem List Decreased strength;Decreased activity tolerance;Decreased balance;Decreased mobility       PT Treatment Interventions DME instruction;Gait training;Patient/family education;Stair training;Functional mobility training;Therapeutic activities;Balance training;Therapeutic exercise;Neuromuscular re-education    PT Goals (Current goals can be found in the Care Plan section)  Acute Rehab PT Goals Patient Stated Goal: Return home. Get to OPPT to work on her strength PT Goal Formulation: With patient Time For Goal Achievement: 11/27/22 Potential to Achieve Goals: Good    Frequency Min 1X/week     Co-evaluation                AM-PAC PT "6 Clicks" Mobility  Outcome Measure Help needed turning from your back to your side while in a flat bed without using bedrails?: None Help needed moving from lying on your back to sitting on the side of a flat bed without using bedrails?: A Little Help needed moving to and from a bed to a chair (including a wheelchair)?: A Little Help needed standing up from a chair using your arms (e.g., wheelchair or bedside chair)?: A Little Help needed to walk in hospital room?: A Little Help needed climbing 3-5 steps with a railing? : A Little 6 Click Score: 19    End of Session   Activity Tolerance: Patient tolerated treatment well Patient left: in bed;with call bell/phone within reach;with bed alarm set Nurse Communication: Mobility status PT Visit Diagnosis: Unsteadiness on feet (R26.81);Muscle weakness (generalized) (M62.81);Difficulty in walking, not elsewhere classified (R26.2)    Time: 0865-7846 PT Time Calculation (min) (ACUTE ONLY): 13 min   Charges:   PT Evaluation $PT Eval Low Complexity: 1 Low   PT General Charges $$ ACUTE PT VISIT: 1 Visit        Delphia Grates. Fairly IV, PT, DPT Physical Therapist- [place]  [facility]  11/13/2022, 3:46 PM

## 2022-11-13 NOTE — ED Notes (Signed)
Patient placed on bedpan and taken off by family. Pericare provided. New bed linen and gown provided. Reconnected to monitor at this time. Patient is requesting home medications, will message attending to obtain orders.

## 2022-11-13 NOTE — Assessment & Plan Note (Signed)
Will continue Eliquis once verified

## 2022-11-13 NOTE — H&P (Signed)
History and Physical    Patient: Pamela James ZOX:[id number] DOB: 19-May-1945 DOA: 11/12/2022 DOS: the patient was seen and examined on 11/13/2022 PCP: Barbette Reichmann, MD  Patient coming from: Home  Chief Complaint:  Chief Complaint  Patient presents with   Weakness    HPI: Pamela James is a 77 y.o. 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  History was provided to the ED provider by daughter who states that she had to be helped to get out of bed which is not usual for her.  Patient was unable to describe what she was feeling.  She denied cough or congestion or chest pain, nausea or vomiting or abdominal pain, diarrhea or dysuria. ED course and data reviewed: BP in the ED as high as 235/76, Other vitals WNL. Labs: Unremarkable CBC except for hemoglobin of 10.9, most recent 12.2 a year ago.  CMP unremarkable with renal function at baseline.  COVID-negative.  Troponin 15.  Urinalysis with large hemoglobin but not consistent with infection. EKG, personally viewed and interpreted showing sinus rhythm at 70 with no concerning findings CT head with no acute intracranial process Chest x-ray not done Patient was treated with labetalol IV 10 mg x 2 but remained persistently hypertensive and was subsequently started on a Cardene infusion. Hospitalist consulted for admission.    Past Medical History:  Diagnosis Date   Anxiety    Asthma    CHF (congestive heart failure) (HCC)    COPD (chronic obstructive pulmonary disease) (HCC)    Depression    GERD (gastroesophageal reflux disease)    Hypertension    Past Surgical History:  Procedure Laterality Date   ABDOMINAL HYSTERECTOMY     COLONOSCOPY WITH PROPOFOL N/A 05/26/2021    Procedure: COLONOSCOPY WITH PROPOFOL;  Surgeon: Regis Bill, MD;  Location: [facility];  Service: Endoscopy;  Laterality: N/A;  PHONE # IS SON'S # (HE IS THE CAREGIVER)   TOTAL HIP ARTHROPLASTY Left    TUBAL LIGATION     Social History:  reports that she has been smoking cigarettes. She has a 30 pack-year smoking history. She has never used smokeless tobacco. She reports that she does not drink alcohol and does not use drugs.  Allergies  Allergen Reactions   Morphine And Codeine Other (See Comments)    Delerium, itching   Nsaids Other (See Comments)    GI ulcers   Aspirin Other (See Comments)    GI ulcers Can tolerate 81 mg daily    Family History  Problem Relation Age of Onset   Heart disease Father    Heart disease Sister    Cancer Brother     Prior to Admission medications   Medication Sig Start Date End Date Taking? Authorizing Provider  albuterol (PROVENTIL HFA) 108 (90 Base) MCG/ACT inhaler Inhale 2 puffs into the lungs every 4 (four) hours as needed for wheezing or shortness of breath. 11/17/19   Sharman Cheek, MD  albuterol (PROVENTIL) (2.5 MG/3ML) 0.083% nebulizer solution Take 2.5 mg by nebulization every 6 (six) hours as needed for wheezing or shortness of breath.    [provider]  alendronate (FOSAMAX) 70 MG tablet Take 70 mg by mouth once a week. Take with a full glass of water on an  empty stomach.    [provider]  ALPRAZolam Prudy Feeler) 0.25 MG tablet Take 0.25 mg by mouth daily as needed. 02/17/21   [provider]  amLODipine (NORVASC) 5 MG tablet Take 5 mg by mouth daily. 12/16/20 12/16/21  [provider]  apixaban (ELIQUIS) 5 MG TABS tablet Take 1 tablet (5 mg total) by mouth 2 (two) times daily. 09/29/19   Pennie Banter, DO  bisacodyl (DULCOLAX) 5 MG EC tablet Take 5 mg by mouth daily as needed.    [provider]  citalopram (CELEXA) 10 MG tablet Take 10 mg by mouth daily.  12/06/19   [provider]  Docusate Sodium (DSS) 100 MG CAPS Take by mouth daily as needed.     [provider]  Ensure (ENSURE) Take 237 mLs by mouth 2 (two) times daily between meals.    [provider]  ferrous sulfate 325 (65 FE) MG EC tablet Take 1 tablet (325 mg total) by mouth 2 (two) times daily. 11/11/19   Almon Hercules, MD  furosemide (LASIX) 20 MG tablet Take 1 tablet (20 mg total) by mouth every other day. 04/24/21 10/21/21  Swinyer, Zachary George, NP  hydrALAZINE (APRESOLINE) 25 MG tablet Take 25 mg by mouth in the morning and at bedtime.    [provider]  ipratropium (ATROVENT) 0.02 % nebulizer solution Take 2.5 mLs (0.5 mg total) by nebulization every 6 (six) hours as needed for wheezing or shortness of breath. 12/27/20   Sharyn Creamer, MD  memantine (NAMENDA) 5 MG tablet Take 1 tablet by mouth 2 (two) times daily. 12/16/20 12/16/21  [provider]  nitroGLYCERIN (NITROSTAT) 0.4 MG SL tablet Place 1 tablet (0.4 mg total) under the tongue every 5 (five) minutes as needed for chest pain. 09/27/19   Pennie Banter, DO  omeprazole (PRILOSEC) 20 MG capsule Take 20 mg by mouth daily.    [provider]  pravastatin (PRAVACHOL) 20 MG tablet Take 20 mg by mouth at bedtime.     [provider]  predniSONE (DELTASONE) 50 MG tablet Take 50 mg by mouth daily as needed.    [provider]  tamsulosin (FLOMAX) 0.4 MG CAPS capsule Take 0.4 mg by mouth daily.    [provider]  traMADol (ULTRAM) 50 MG tablet Take 50 mg by mouth at bedtime. 01/09/20   [provider]  traZODone (DESYREL) 150 MG tablet Take 150 mg by mouth at bedtime. 12/17/20   [provider]  Vibegron (GEMTESA) 75 MG TABS Take 75 mg by mouth daily. 05/21/21   Stoioff, Verna Czech, MD  vitamin B-12 (CYANOCOBALAMIN) 1000 MCG tablet Take 1,000 mcg by mouth daily.    [provider]    Physical Exam: Vitals:   11/13/22 0000 11/13/22 0030 11/13/22 0045 11/13/22 0100  BP:  (!) 175/51 (!) 174/65 (!) 189/62 (!) 175/55  Pulse: 67 65 64 62  Resp: 20 17 20 19   Temp:      TempSrc:      SpO2: 100% 100% 100% 100%  Weight:      Height:       Physical Exam Vitals and nursing note reviewed.  Constitutional:      General: She is not in acute distress. HENT:     Head: Normocephalic and atraumatic.  Cardiovascular:     Rate and Rhythm: Normal rate and regular rhythm.     Heart sounds: Normal heart sounds.  Pulmonary:     Effort: Pulmonary effort  is normal.     Breath sounds: Normal breath sounds.  Abdominal:     Palpations: Abdomen is soft.     Tenderness: There is no abdominal tenderness.  Neurological:     Mental Status: Mental status is at baseline.     Labs on Admission: I have personally reviewed following labs and imaging studies  CBC: Recent Labs  Lab 11/12/22 1852  WBC 7.8  HGB 10.9*  HCT 32.6*  MCV 93.4  PLT 536*   Basic Metabolic Panel: Recent Labs  Lab 11/12/22 1852 11/12/22 2033  NA SPECIMEN HEMOLYZED. HEMOLYSIS MAY AFFECT INTEGRITY OF RESULTS. 142  K  --  4.5  CL  --  107  CO2  --  24  GLUCOSE  --  90  BUN  --  17  CREATININE  --  1.40*  CALCIUM  --  9.6   GFR: Estimated Creatinine Clearance: 31.9 mL/min (A) (by C-G formula based on SCr of 1.4 mg/dL (H)). Liver Function Tests: Recent Labs  Lab 11/12/22 2033  AST 14*  ALT 10  ALKPHOS 67  BILITOT 0.9  PROT 6.7  ALBUMIN 4.1   No results for input(s): "LIPASE", "AMYLASE" in the last 168 hours. No results for input(s): "AMMONIA" in the last 168 hours. Coagulation Profile: No results for input(s): "INR", "PROTIME" in the last 168 hours. Cardiac Enzymes: No results for input(s): "CKTOTAL", "CKMB", "CKMBINDEX", "TROPONINI" in the last 168 hours. BNP (last 3 results) No results for input(s): "PROBNP" in the last 8760 hours. HbA1C: No results for input(s): "HGBA1C" in the last 72 hours. CBG: No results for input(s): "GLUCAP" in the last 168 hours. Lipid Profile: No  results for input(s): "CHOL", "HDL", "LDLCALC", "TRIG", "CHOLHDL", "LDLDIRECT" in the last 72 hours. Thyroid Function Tests: No results for input(s): "TSH", "T4TOTAL", "FREET4", "T3FREE", "THYROIDAB" in the last 72 hours. Anemia Panel: No results for input(s): "VITAMINB12", "FOLATE", "FERRITIN", "TIBC", "IRON", "RETICCTPCT" in the last 72 hours. Urine analysis:    Component Value Date/Time   COLORURINE YELLOW (A) 11/12/2022 1852   APPEARANCEUR HAZY (A) 11/12/2022 1852   APPEARANCEUR Clear 05/21/2021 1440   LABSPEC 1.013 11/12/2022 1852   PHURINE 8.0 11/12/2022 1852   GLUCOSEU NEGATIVE 11/12/2022 1852   HGBUR LARGE (A) 11/12/2022 1852   BILIRUBINUR NEGATIVE 11/12/2022 1852   BILIRUBINUR Negative 05/21/2021 1440   KETONESUR NEGATIVE 11/12/2022 1852   PROTEINUR 30 (A) 11/12/2022 1852   NITRITE NEGATIVE 11/12/2022 1852   LEUKOCYTESUR NEGATIVE 11/12/2022 1852    Radiological Exams on Admission: CT HEAD WO CONTRAST ( )  Result Date: 11/12/2022 CLINICAL DATA:  Mental status change of unknown cause. Generalized weakness and near syncopal episode. History of bladder cancer. EXAM: CT HEAD WITHOUT CONTRAST TECHNIQUE: Contiguous axial images were obtained from the base of the skull through the vertex without intravenous contrast. RADIATION DOSE REDUCTION: This exam was performed according to the departmental dose-optimization program which includes automated exposure control, adjustment of the mA and/or kV according to patient size and/or use of iterative reconstruction technique. COMPARISON:  MRI brain 06/20/2020.  CT head 09/15/2019 FINDINGS: Brain: No evidence of acute infarction, hemorrhage, hydrocephalus, extra-axial collection or mass lesion/mass effect. Vascular: No hyperdense vessel or unexpected calcification. Skull: Normal. Negative for fracture or focal lesion. Sinuses/Orbits: No acute finding. Other: None. IMPRESSION: No acute intracranial abnormalities. Electronically Signed   By:  Burman Nieves M.D.   On: 11/12/2022 19:27     Data Reviewed: Relevant notes from primary care and specialist visits, past discharge summaries as available  in EHR, including Care Everywhere. Prior diagnostic testing as pertinent to current admission diagnoses Updated medications and problem lists for reconciliation ED course, including vitals, labs, imaging, treatment and response to treatment Triage notes, nursing and pharmacy notes and ED provider's notes Notable results as noted in HPI   Assessment and Plan: * Hypertensive encephalopathy Postural dizziness with presyncope Patient presenting with generalized weakness and confusion -->improving Presyncope possibly related to hypertension No stigmata of infection Cardiac monitoring, echocardiogram, carotid Doppler Continue Cardene drip and resume home oral antihypertensives in order to wean off Cardene drip CT head nonacute Follow-up MRI  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 on biopsy from 09/12/19 following initial presentation for gross hematuria and left hydronephrosis and left flank pain--last seen by urology at [facility] in 2023 Patient with microscopic hematuria on UA  Anxiety Continue trazodone, citalopram and alprazolam  History of subclavian DVT (deep vein thrombosis) Will continue Eliquis once verified  Chronic diastolic CHF (congestive heart failure) (HCC) Clinically euvolemic Not on GDMT pending verification of meds Last EF 60 to 65% 2021  COPD (chronic obstructive pulmonary disease) (HCC) Not acutely exacerbated DuoNebs as needed     DVT prophylaxis: Lovenox  Consults: none  Advance Care Planning:   Code Status: Prior   Family Communication: none  Disposition Plan: Back to previous home environment  Severity of Illness: The appropriate patient status for this patient is INPATIENT. Inpatient status is judged to  be reasonable and necessary in order to provide the required intensity of service to ensure the patient's safety. The patient's presenting symptoms, physical exam findings, and initial radiographic and laboratory data in the context of their chronic comorbidities is felt to place them at high risk for further clinical deterioration. Furthermore, it is not anticipated that the patient will be medically stable for discharge from the hospital within 2 midnights of admission.   * I certify that at the point of admission it is my clinical judgment that the patient will require inpatient hospital care spanning beyond 2 midnights from the point of admission due to high intensity of service, high risk for further deterioration and high frequency of surveillance required.*  Author: Andris Baumann, MD 11/13/2022 1:24 AM  For on call review [email or web address].

## 2022-11-13 NOTE — Assessment & Plan Note (Signed)
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

## 2022-11-13 NOTE — Assessment & Plan Note (Signed)
Not acutely exacerbated DuoNebs as needed

## 2022-11-13 NOTE — Plan of Care (Signed)
  Problem: Education: Goal: Knowledge of condition and prescribed therapy will improve Outcome: Progressing   Problem: Cardiac: Goal: Will achieve and/or maintain adequate cardiac output Outcome: Progressing   Problem: Physical Regulation: Goal: Complications related to the disease process, condition or treatment will be avoided or minimized Outcome: Progressing   

## 2022-11-13 NOTE — Progress Notes (Signed)
No charge note Patient admitted to the hospital for hypertensive encephalopathy.  She is seen and examined at the bedside and mental status has improved markedly.  Patient is oriented to person, place and time.  She appears to be very anxious and tearful. Complains of pain from the blood pressure cuff.  Has been off Cardene drip. Continue oral antihypertensive medications Only apply blood pressure cuff will need to check blood pressure intake of after Continue anxiolytics Will monitor overnight

## 2022-11-13 NOTE — Assessment & Plan Note (Addendum)
Postural dizziness with presyncope Patient presenting with generalized weakness and confusion -->improving Presyncope possibly related to hypertension No stigmata of infection Cardiac monitoring, echocardiogram, carotid Doppler Continue Cardene drip and resume home oral antihypertensives in order to wean off Cardene drip CT head nonacute Follow-up MRI

## 2022-11-13 NOTE — Assessment & Plan Note (Addendum)
Clinically euvolemic Not on GDMT pending verification of meds Last EF 60 to 65% 2021

## 2022-11-13 NOTE — Assessment & Plan Note (Signed)
Continue trazodone, citalopram and alprazolam

## 2022-11-14 ENCOUNTER — Inpatient Hospital Stay: Admit: 2022-11-14 | Payer: Medicare HMO

## 2022-11-14 DIAGNOSIS — I674 Hypertensive encephalopathy: Secondary | ICD-10-CM | POA: Diagnosis not present

## 2022-11-14 DIAGNOSIS — R55 Syncope and collapse: Secondary | ICD-10-CM | POA: Diagnosis not present

## 2022-11-14 LAB — ECHOCARDIOGRAM COMPLETE
AR max vel: 2.33 cm2
AV Area VTI: 2.51 cm2
AV Area mean vel: 2.07 cm2
AV Mean grad: 7 mmHg
AV Peak grad: 13.4 mmHg
Ao pk vel: 1.83 m/s
Area-P 1/2: 2.74 cm2
Height: 63 in
S' Lateral: 3.1 cm
Weight: 2430.35 [oz_av]

## 2022-11-14 LAB — GLUCOSE, CAPILLARY: Glucose-Capillary: 94 mg/dL (ref 70–99)

## 2022-11-14 MED ORDER — AMLODIPINE BESYLATE 10 MG PO TABS
10.0000 mg | ORAL_TABLET | Freq: Every day | ORAL | Status: DC
  Administered 2022-11-14 – 2022-11-16 (×3): 10 mg via ORAL
  Filled 2022-11-14 (×3): qty 1

## 2022-11-14 MED ORDER — HYDRALAZINE HCL 25 MG PO TABS
25.0000 mg | ORAL_TABLET | Freq: Once | ORAL | Status: AC
  Administered 2022-11-14: 25 mg via ORAL
  Filled 2022-11-14: qty 1

## 2022-11-14 MED ORDER — AMLODIPINE BESYLATE 5 MG PO TABS
5.0000 mg | ORAL_TABLET | Freq: Once | ORAL | Status: AC
  Administered 2022-11-14: 5 mg via ORAL
  Filled 2022-11-14: qty 1

## 2022-11-14 MED ORDER — HYDRALAZINE HCL 50 MG PO TABS
50.0000 mg | ORAL_TABLET | Freq: Three times a day (TID) | ORAL | Status: DC
  Administered 2022-11-14 – 2022-11-15 (×5): 50 mg via ORAL
  Filled 2022-11-14 (×5): qty 1

## 2022-11-14 NOTE — TOC Initial Note (Signed)
Transition of Care [facility]) - Initial/Assessment Note    Patient Details  Name: Pamela James MRN: [id number] Date of Birth: 10-Feb-1946  [facility] CM/SW Contact:    Liliana Cline, LCSW Phone Number: 11/14/2022, 11:20 AM  Clinical Narrative:                 Met with patient at bedside.  Patient is from home with her son, daughter in law, and grandchildren. Son or daughter in law will provide transportation home at time of DC. PCP is Dr. Marcello Fennel. [facility] [address].  Patient has a RW and a cane at home.  Patient had HH in the past and is aware of PT rec for HH at DC. Patient declines HH at this time, agreed to let staff know if she changes her mind prior to DC. She also agreed to let her PCP know if she decides she wants HH after she is DC.   Expected Discharge Plan: Home/Self Care Barriers to Discharge: Continued Medical Work up   Patient Goals and CMS Choice Patient states their goals for this hospitalization and ongoing recovery are:: to return home with family CMS Medicare.gov Compare Post Acute Care list provided to:: Patient Choice offered to / list presented to : Patient      Expected Discharge Plan and Services       Living arrangements for the past 2 months: Single Family Home                                      Prior Living Arrangements/Services Living arrangements for the past 2 months: Single Family Home Lives with:: Relatives Patient language and need for interpreter reviewed:: Yes Do you feel safe going back to the place where you live?: Yes      Need for Family Participation in Patient Care: Yes (Comment) Care giver support system in place?: Yes (comment) Current home services: DME Criminal Activity/Legal Involvement Pertinent to Current Situation/Hospitalization: No - Comment as needed  Activities of Daily Living Home Assistive Devices/Equipment: Walker (specify type), Eyeglasses ADL Screening (condition at time of  admission) Patient's cognitive ability adequate to safely complete daily activities?: No Is the patient deaf or have difficulty hearing?: No Does the patient have difficulty seeing, even when wearing glasses/contacts?: No Does the patient have difficulty concentrating, remembering, or making decisions?: Yes Patient able to express need for assistance with ADLs?: Yes Does the patient have difficulty dressing or bathing?: Yes Independently performs ADLs?: No Communication: Independent Dressing (OT): Needs assistance Is this a change from baseline?: Change from baseline, expected to last <3days Grooming: Independent Feeding: Independent Bathing: Needs assistance Is this a change from baseline?: Change from baseline, expected to last <3 days Toileting: Needs assistance Is this a change from baseline?: Change from baseline, expected to last <3 days In/Out Bed: Needs assistance Is this a change from baseline?: Change from baseline, expected to last <3 days Walks in Home: Needs assistance Is this a change from baseline?: Change from baseline, expected to last <3 days Does the patient have difficulty walking or climbing stairs?: Yes Weakness of Legs: Both Weakness of Arms/Hands: Both  Permission Sought/Granted                  Emotional Assessment       Orientation: : Oriented to Situation, Oriented to Self, Oriented to Place, Oriented to  Time Alcohol /  Substance Use: Not Applicable Psych Involvement: No (comment)  Admission diagnosis:  Hypertensive encephalopathy [I67.4] Lower urinary tract infectious disease [N39.0] Confusion [R41.0] Generalized weakness [R53.1] Hypertensive emergency [I16.1] Patient Active Problem List   Diagnosis Date Noted   Chronic diastolic CHF (congestive heart failure) (HCC) 11/13/2022   History of subclavian DVT (deep vein thrombosis) 11/13/2022   Anxiety 11/13/2022   Postural dizziness with presyncope 11/13/2022   Urothelial carcinoma with low  risk of recurrence (HCC) 11/13/2022   Hypertensive encephalopathy 11/12/2022   Major depressive disorder, recurrent, in remission (HCC) 12/16/2020   Thrombocytosis 01/05/2020   Obstructive uropathy 11/10/2019   Hydronephrosis 11/10/2019   Pulmonary edema 11/10/2019   Acute hypoxemic respiratory failure (HCC) 11/09/2019   Acute congestive heart failure (HCC)    AKI (acute kidney injury) (HCC)    Junctional bradycardia    Hyponatremia    Hyperkalemia    Acute on chronic respiratory failure with hypoxia (HCC) 09/15/2019   COPD (chronic obstructive pulmonary disease) (HCC) 11/21/2017   Acute bronchitis 03/26/2016   Essential hypertension 03/26/2016   Chest pain 03/25/2016   PCP:  Barbette Reichmann, MD Pharmacy:   [facility] - [place], [place] - [address] [place] [address] Phone: [phone] Fax: [phone]  [id number] [place], [place] - [address] [place] [address] Phone: [phone] Fax: [phone]     Social Determinants of Health (SDOH) Social History: SDOH Screenings   Food Insecurity: No Food Insecurity (11/13/2022)  Housing: Low Risk  (11/13/2022)  Transportation Needs: No Transportation Needs (11/13/2022)  Utilities: Not At Risk (11/13/2022)  Financial Resource Strain: Low Risk  (07/10/2022)   Received from [facility]  Physical Activity: Inactive (07/11/2021)   Received from [facility]  Social Connections: Socially Isolated (07/11/2021)   Received from [facility]  Tobacco Use: High Risk (11/13/2022)   SDOH Interventions:     Readmission Risk Interventions    11/14/2022   11:20 AM  Readmission Risk Prevention Plan  Transportation Screening Complete  PCP or Specialist Appt within 5-7 Days Complete  Home Care Screening Complete  Medication Review (RN CM) Complete

## 2022-11-14 NOTE — Progress Notes (Addendum)
Progress Note   Patient: Pamela James DOB: 11-01-1945 DOA: 11/12/2022     2 DOS: the patient was seen and examined on 11/14/2022   Brief hospital course:   Pamela James is a 77 y.o. 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  History was provided to the ED provider by daughter who states that she had to be helped to get out of bed which is not usual for her.  Patient was unable to describe what she was feeling.  She denied cough or congestion or chest pain, nausea or vomiting or abdominal pain, diarrhea or dysuria. ED course and data reviewed: BP in the ED as high as 235/76, Other vitals WNL. Labs: Unremarkable CBC except for hemoglobin of 10.9, most recent 12.2 a year ago.  CMP unremarkable with renal function at baseline.  COVID-negative.  Troponin 15.  Urinalysis with large hemoglobin but not consistent with infection. EKG, personally viewed and interpreted showing sinus rhythm at 70 with no concerning findings CT head with no acute intracranial process Chest x-ray not done Patient was treated with labetalol IV 10 mg x 2 but remained persistently hypertensive and was subsequently started on a Cardene infusion. Hospitalist consulted for admission.     Assessment and Plan:  * Hypertensive encephalopathy Postural dizziness with presyncope Patient presenting with generalized weakness and confusion -->improving Appears to be back to her baseline mental status. Blood pressure remains elevated but improved from admission.  Continue amlodipine and hydralazine.  Uptitrate to optimize blood pressure control. MRI of the brain is stable and normal for age noncontrast MRI.However, bone marrow signal has become abnormal  since a 2022 but is nonspecific. Cannot exclude metastatic disease or a marrow infiltrative process. But red marrow reactivation is also a possibility, query anemia. 2D Echocardiogram shows an LVEF of 60 - 65%.     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 on biopsy from 09/12/19 following initial presentation for gross hematuria and left hydronephrosis and left flank pain--last seen by urology at [facility] in 2023 Patient with microscopic hematuria on UA    Anxiety Continue trazodone, citalopram and alprazolam    History of subclavian DVT (deep vein thrombosis) Continue Eliquis    Chronic diastolic CHF (congestive heart failure) (HCC) Clinically euvolemic Not on GDMT  Last EF 60 to 65% 2021    COPD (chronic obstructive pulmonary disease) (HCC) Not acutely exacerbated DuoNebs as needed           Subjective: Patient is seen and examined at the bedside. Looks better this morning. Less anxious  Physical Exam: Vitals:   11/14/22 0339 11/14/22 0500 11/14/22 0815 11/14/22 1206  BP: (!) 159/64  (!) 149/65 (!) 172/81  Pulse: 75  80 77  Resp: 16  18 19   Temp: 98.7 F (37.1 C)  98.6 F (37 C) 99.2 F (37.3 C)  TempSrc: Oral  Oral   SpO2: 96%  95% 96%  Weight:  68.9 kg    Height:       Vitals and nursing note reviewed.  Constitutional:      General: She is not in acute distress. HENT:     Head: Normocephalic and atraumatic.  Cardiovascular:  Rate and Rhythm: Normal rate and regular rhythm.     Heart sounds: Normal heart sounds.  Pulmonary:     Effort: Pulmonary effort is normal.     Breath sounds: Normal breath sounds.  Abdominal:     Palpations: Abdomen is soft.     Tenderness: There is no abdominal tenderness.  Neurological:     Mental Status: Mental status is at baseline.     Data Reviewed:  There are no new results to review at this time.  Family Communication:  Discussed plan of care with patient's daughter in law  Disposition: Status is: Inpatient Remains inpatient appropriate because: Improved BP control  Planned Discharge Destination: Home    Time spent: 30 minutes  Author: Lucile Shutters, MD 11/14/2022 2:13 PM  For on call review [email or web address].

## 2022-11-14 NOTE — Plan of Care (Signed)
  Problem: Education: Goal: Knowledge of condition and prescribed therapy will improve Outcome: Progressing   Problem: Health Behavior/Discharge Planning: Goal: Ability to manage health-related needs will improve Outcome: Progressing   Problem: Clinical Measurements: Goal: Will remain free from infection Outcome: Progressing   Problem: Activity: Goal: Risk for activity intolerance will decrease Outcome: Progressing   Problem: Nutrition: Goal: Adequate nutrition will be maintained Outcome: Progressing   Problem: Elimination: Goal: Will not experience complications related to urinary retention Outcome: Progressing   Problem: Pain Managment: Goal: General experience of comfort will improve Outcome: Progressing   Problem: Coping: Goal: Level of anxiety will decrease Outcome: Not Progressing

## 2022-11-14 NOTE — Progress Notes (Signed)
  Echocardiogram 2D Echocardiogram has been performed.  Lenor Coffin 11/14/2022, 11:02 AM

## 2022-11-15 DIAGNOSIS — I674 Hypertensive encephalopathy: Secondary | ICD-10-CM | POA: Diagnosis not present

## 2022-11-15 LAB — GLUCOSE, CAPILLARY: Glucose-Capillary: 106 mg/dL — ABNORMAL HIGH (ref 70–99)

## 2022-11-15 MED ORDER — AMLODIPINE BESYLATE 10 MG PO TABS: 10.0000 mg | ORAL_TABLET | Freq: Every day | ORAL | 1 refills | Status: DC

## 2022-11-15 MED ORDER — HYDRALAZINE HCL 50 MG PO TABS: 50.0000 mg | ORAL_TABLET | Freq: Three times a day (TID) | ORAL | 0 refills | Status: DC

## 2022-11-15 MED ORDER — HYDRALAZINE HCL 50 MG PO TABS
100.0000 mg | ORAL_TABLET | Freq: Three times a day (TID) | ORAL | Status: DC
  Administered 2022-11-15 – 2022-11-17 (×5): 100 mg via ORAL
  Filled 2022-11-15 (×5): qty 2

## 2022-11-15 MED ORDER — HYDRALAZINE HCL 20 MG/ML IJ SOLN
5.0000 mg | Freq: Once | INTRAMUSCULAR | Status: AC
  Administered 2022-11-15: 5 mg via INTRAVENOUS
  Filled 2022-11-15: qty 1

## 2022-11-15 NOTE — Plan of Care (Signed)
  Problem: Education: Goal: Knowledge of General Education information will improve Description: Including pain rating scale, medication(s)/side effects and non-pharmacologic comfort measures Outcome: Progressing   Problem: Health Behavior/Discharge Planning: Goal: Ability to manage health-related needs will improve Outcome: Progressing   Problem: Clinical Measurements: Goal: Respiratory complications will improve Outcome: Progressing   Problem: Activity: Goal: Risk for activity intolerance will decrease Outcome: Progressing   Problem: Nutrition: Goal: Adequate nutrition will be maintained Outcome: Progressing   Problem: Skin Integrity: Goal: Risk for impaired skin integrity will decrease Outcome: Progressing   Problem: Coping: Goal: Level of anxiety will decrease Outcome: Not Progressing   Problem: Pain Managment: Goal: General experience of comfort will improve Outcome: Not Progressing

## 2022-11-15 NOTE — Discharge Summary (Signed)
Physician Discharge Summary   Patient: Pamela James MRN: [id number] DOB: 1945-06-30  Admit date:     11/12/2022  Discharge date: 11/17/22  Discharge Physician: Galena Logie   PCP: Barbette Reichmann, MD   Recommendations at discharge:   Take medications as recommended Return to the ER if worsening symptoms  Discharge Diagnoses: Principal Problem:   Hypertensive encephalopathy Active Problems:   Postural dizziness with presyncope   COPD (chronic obstructive pulmonary disease) (HCC)   Chronic diastolic CHF (congestive heart failure) (HCC)   History of subclavian DVT (deep vein thrombosis)   Anxiety   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 [facility] Course:  Pamela James is a 77 y.o. 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  History was provided to the ED provider by daughter who states that she had to be helped to get out of bed which is not usual for her.  Patient was unable to describe what she was feeling.  She denied cough or congestion or chest pain, nausea or vomiting or abdominal pain, diarrhea or dysuria. ED course and data reviewed: BP in the ED as high as 235/76, Other vitals WNL. Labs: Unremarkable CBC except for hemoglobin of 10.9, most recent 12.2 a year ago.  CMP unremarkable with renal function at baseline.  COVID-negative.  Troponin 15.  Urinalysis with large hemoglobin but not consistent with infection. EKG, personally viewed and interpreted showing sinus rhythm at 70 with no concerning findings CT head with no acute intracranial process Chest x-ray not done Patient was treated with labetalol IV 10 mg x 2 but remained  persistently hypertensive and was subsequently started on a Cardene infusion. Hospitalist consulted for admission.         Assessment and Plan:  * Hypertensive encephalopathy Postural dizziness with presyncope Patient presenting with generalized weakness and confusion -->improved Appears to be back to her baseline mental status and is awake, alert and oriented to person place and time Blood pressure has improved from admission.   Continue amlodipine and hydralazine.   MRI of the brain is stable and normal for age.  However, bone marrow signal has become abnormal since a 2022 but is nonspecific. Cannot exclude metastatic disease or a marrowinfiltrative process. But red marrow reactivation is also a possibility, query anemia. 2D Echocardiogram shows an LVEF of 60 - 65%.       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 on biopsy from 09/12/19 following initial presentation for gross hematuria and left hydronephrosis and left flank pain--last [facility] by [facility] in [facility]23 Patient with microscopic hematuria on UA Keep scheduled follow-up with urology     Anxiety Continue trazodone, citalopram and alprazolam     History of subclavian DVT (deep vein thrombosis) Continue Eliquis    Chronic diastolic CHF (congestive heart failure) (HCC) Clinically euvolemic Not on GDMT  Last EF 60 to 65% 2021     COPD (chronic obstructive pulmonary disease) (HCC) Not acutely exacerbated DuoNebs as needed         Patient was seen and examined at bedside prior to discharge and is in stable condition.  Consultants: None Procedures performed: 2D echocardiogram Disposition: Home Diet recommendation:  Discharge Diet Orders (From admission, onward)     Start     Ordered   11/15/22 0000  Diet - low sodium heart healthy        11/15/22 1508           Cardiac diet DISCHARGE  MEDICATION: Allergies as of 11/17/2022       Reactions   Morphine And Codeine Other (See Comments)   Delerium, itching   Nsaids Other (See Comments)   GI ulcers   Aspirin Other (See Comments)   GI ulcers Can tolerate 81 mg daily        Medication List     STOP taking these medications    alendronate 70 MG tablet Commonly known as: FOSAMAX   ferrous sulfate 325 (65 FE) MG EC tablet   furosemide 20 MG tablet Commonly known as: Lasix   Gemtesa 75 MG Tabs Generic drug: Vibegron   nitroGLYCERIN 0.4 MG SL tablet Commonly known as: NITROSTAT       TAKE these medications    albuterol 108 (90 Base) MCG/ACT inhaler Commonly known as: Proventil HFA Inhale 2 puffs into the lungs every 4 (four) hours as needed for wheezing or shortness of breath.   ALPRAZolam 0.5 MG tablet Commonly known as: XANAX Take 0.5 mg by mouth 2 (two) times daily.   amLODipine 5 MG tablet Commonly known as: NORVASC Take 1 tablet (5 mg total) by mouth 2 (two) times daily. What changed:  when to take this additional instructions Another medication with the same name was removed. Continue taking this medication, and follow the directions you see here.   apixaban 5 MG Tabs tablet Commonly known as: ELIQUIS Take 1 tablet (5 mg total) by mouth 2 (two) times daily.   cholecalciferol 25 MCG (1000 UNIT) tablet Commonly known as: VITAMIN D3 Take 1,000 Units by mouth daily.   cyanocobalamin 1000 MCG tablet Commonly known as: VITAMIN B12 Take 1,000 mcg by mouth daily.   DSS 100 MG Caps Take 100 mg by mouth daily.   Fluticasone-Umeclidin-Vilant 100-62.5-25 MCG/ACT Aepb Take 1 puff by mouth daily.   hydrALAZINE 50 MG tablet Commonly known as: APRESOLINE Take 1 tablet (50 mg total) by mouth 2 (two) times daily.   lisinopril 10 MG tablet Commonly known as: ZESTRIL Take 1 tablet (10 mg total) by mouth daily. Start taking on: November 18, 2022   memantine 5 MG tablet Commonly known as:  NAMENDA Take 5 mg by mouth 2 (two) times daily.   omeprazole 20 MG capsule Commonly known as: PRILOSEC Take 20 mg by mouth daily.   polyethylene glycol 17 g packet Commonly known as: MIRALAX / GLYCOLAX Take 17 g by mouth daily.   pravastatin 20 MG tablet Commonly known as: PRAVACHOL Take 20 mg by mouth at bedtime.   tamsulosin 0.4 MG Caps capsule Commonly known as: FLOMAX Take 0.4 mg by mouth daily.   traMADol 50 MG tablet Commonly known as: ULTRAM Take 50 mg by mouth daily as needed for moderate pain.        Follow-up Information     Barbette Reichmann, MD Follow up in 1 week(s).   Specialty: Internal Medicine Contact information: [address] [place] [address] [phone]         Antonieta Iba, MD Follow up in 1 week(s).   Specialty: Cardiology Why: Near syncope Contact information: [address] [place]  [place] [address] [phone]                Discharge Exam: Filed Weights   11/12/22 1720 11/13/22 1412 11/14/22 0500  Weight: 73.9 kg 69 kg 68.9 kg   Vitals and nursing note reviewed.  Constitutional:      General: She is not in acute distress. HENT:     Head: Normocephalic and atraumatic.  Cardiovascular:     Rate and Rhythm: Normal rate and regular rhythm.     Heart sounds: Normal heart sounds.  Pulmonary:     Effort: Pulmonary effort is normal.     Breath sounds: Normal breath sounds.  Abdominal:     Palpations: Abdomen is soft.     Tenderness: There is no abdominal tenderness.  Neurological:     Mental Status: Mental status is at baseline.     Condition at discharge: stable  The results of significant diagnostics from this hospitalization (including imaging, microbiology, ancillary and laboratory) are listed below for reference.   Imaging Studies: ECHOCARDIOGRAM COMPLETE  Result Date: 11/14/2022    ECHOCARDIOGRAM REPORT   Patient Name:   Pamela James Date of Exam: 11/14/2022  Medical Rec #:  [id number]    Height:       63.0 in Accession #:    [id number]   Weight:       151.9 lb Date of Birth:  01/12/1946     BSA:          1.720 m Patient Age:    37 years     BP:           159/64 mmHg Patient Gender: F            HR:           75 bpm. Exam Location:  [facility] Procedure: 2D Echo and Strain Analysis Indications:     Syncope R55  History:         Patient has prior history of Echocardiogram examinations, most                  recent 12/17/2020.  Sonographer:     Overton Mam RDCS, FASE Referring Phys:  [id number] Andris Baumann Diagnosing Phys: Chilton Si MD  Sonographer Comments: Global longitudinal strain was attempted. IMPRESSIONS  1. Mild intracavitary gradient. Peak velocity 1.38 m/s. Peak gradient 7.6 mmHg. Left ventricular ejection fraction, by estimation, is 65 to 70%. The left ventricle has normal function. The left ventricle has no regional wall motion abnormalities. There is moderate concentric left ventricular hypertrophy. Left ventricular diastolic parameters are consistent with Grade I diastolic dysfunction (impaired relaxation). The average left ventricular global longitudinal strain is -17.6 %. The global longitudinal strain is normal.  2. Right ventricular systolic function is normal. The right ventricular size is normal. There is normal pulmonary artery systolic pressure.  3. The mitral valve is normal in structure. No evidence of mitral valve regurgitation. No evidence of mitral stenosis.  4. The aortic valve is tricuspid. Aortic valve regurgitation is not visualized. No aortic stenosis is present.  5. The inferior vena cava is normal in size with greater than 50% respiratory variability, suggesting right atrial pressure of 3 mmHg. FINDINGS  Left Ventricle: Mild intracavitary gradient. Peak velocity 1.38 m/s. Peak gradient 7.6 mmHg. Left ventricular ejection fraction, by estimation, is 65 to 70%. The left ventricle has normal function. The left ventricle has no regional  wall motion abnormalities. The average left ventricular global longitudinal strain is -17.6 %. The  global longitudinal strain is normal. The left ventricular internal cavity size was normal in size. There is moderate concentric left ventricular hypertrophy. Left ventricular diastolic parameters are consistent with Grade I diastolic dysfunction (impaired relaxation). Normal left ventricular filling pressure. Right Ventricle: The right ventricular size is normal. No increase in right ventricular wall thickness. Right ventricular systolic function is normal. There is normal pulmonary artery systolic pressure. The tricuspid regurgitant velocity is 2.05 m/s, and  with an assumed right atrial pressure of 3 mmHg, the estimated right ventricular systolic pressure is 19.8 mmHg. Left Atrium: Left atrial size was normal in size. Right Atrium: Right atrial size was normal in size. Pericardium: There is no evidence of pericardial effusion. Mitral Valve: The mitral valve is normal in structure. No evidence of mitral valve regurgitation. No evidence of mitral valve stenosis. Tricuspid Valve: The tricuspid valve is normal in structure. Tricuspid valve regurgitation is trivial. No evidence of tricuspid stenosis. Aortic Valve: The aortic valve is tricuspid. Aortic valve regurgitation is not visualized. No aortic stenosis is present. Aortic valve mean gradient measures 7.0 mmHg. Aortic valve peak gradient measures 13.4 mmHg. Aortic valve area, by VTI measures 2.51  cm. Pulmonic Valve: The pulmonic valve was normal in structure. Pulmonic valve regurgitation is not visualized. No evidence of pulmonic stenosis. Aorta: The aortic root is normal in size and structure. Venous: The inferior vena cava is normal in size with greater than 50% respiratory variability, suggesting right atrial pressure of 3 mmHg. IAS/Shunts: No atrial level shunt detected by color flow Doppler.  LEFT VENTRICLE PLAX 2D LVIDd:         4.60 cm   Diastology LVIDs:          3.10 cm   LV e' medial:    10.00 cm/s LV PW:         1.40 cm   LV E/e' medial:  7.8 LV IVS:        1.40 cm   LV e' lateral:   11.40 cm/s LVOT diam:     2.10 cm   LV E/e' lateral: 6.8 LV SV:         97 LV SV Index:   57        2D Longitudinal Strain LVOT Area:     3.46 cm  2D Strain GLS (A2C):   -23.3 %                          2D Strain GLS (A3C):   -13.6 %                          2D Strain GLS (A4C):   -16.0 %                          2D Strain GLS Avg:     -17.6 % RIGHT VENTRICLE RV Basal diam:  2.40 cm RV S prime:     15.20 cm/s TAPSE (M-mode): 1.8 cm LEFT ATRIUM             Index        RIGHT ATRIUM          Index LA diam:        3.10 cm 1.80 cm/m   RA Area:     9.42 cm LA Vol (A2C):   31.7 ml 18.43 ml/m  RA Volume:   19.10 ml 11.10 ml/m LA  Vol (A4C):   25.8 ml 15.00 ml/m LA Biplane Vol: 29.7 ml 17.26 ml/m  AORTIC VALVE                     PULMONIC VALVE AV Area (Vmax):    2.33 cm      PV Vmax:        1.32 m/s AV Area (Vmean):   2.07 cm      PV Peak grad:   7.0 mmHg AV Area (VTI):     2.51 cm      RVOT Peak grad: 7 mmHg AV Vmax:           183.00 cm/s AV Vmean:          126.000 cm/s AV VTI:            0.388 m AV Peak Grad:      13.4 mmHg AV Mean Grad:      7.0 mmHg LVOT Vmax:         123.00 cm/s LVOT Vmean:        75.200 cm/s LVOT VTI:          0.281 m LVOT/AV VTI ratio: 0.72  AORTA Ao Root diam: 3.00 cm MITRAL VALVE                TRICUSPID VALVE MV Area (PHT): 2.74 cm     TR Peak grad:   16.8 mmHg MV Decel Time: 277 msec     TR Vmax:        205.00 cm/s MV E velocity: 78.00 cm/s MV A velocity: 107.00 cm/s  SHUNTS MV E/A ratio:  0.73         Systemic VTI:  0.28 m                             Systemic Diam: 2.10 cm Chilton Si MD Electronically signed by Chilton Si MD Signature Date/Time: 11/14/2022/2:01:21 PM    Final    MR BRAIN WO CONTRAST  Result Date: 11/13/2022 CLINICAL DATA:  77 year old female with weakness, altered mental status. Confusion. History of bladder cancer. Recent  exposure to COVID-19. EXAM: MRI HEAD WITHOUT CONTRAST TECHNIQUE: Multiplanar, multiecho pulse sequences of the brain and surrounding structures were obtained without intravenous contrast. COMPARISON:  Noncontrast head CT yesterday. Previous brain MRI 06/20/2020. FINDINGS: Brain: Cerebral volume remains normal for age. No restricted diffusion to suggest acute infarction. No midline shift, mass effect, evidence of mass lesion, ventriculomegaly, extra-axial collection or acute intracranial hemorrhage. Cervicomedullary junction and pituitary are within normal limits. Wallace Cullens and white matter signal is stable from the 2022 MRI and within normal limits for age. This includes on thin slice coronal T2 and FLAIR imaging today. No cortical encephalomalacia, cerebral edema, chronic cerebral blood products. Vascular: Major intracranial vascular flow voids are stable. Skull and upper cervical spine: Diffusely decreased T1 marrow signal in the visible spine and at the skull base, and associated with suspicious new bone diffusion signal (series 9, image 59). But no destructive osseous lesion identified. Some of the calvarium signal remains more normal. No age advanced upper cervical spine degeneration is evident. Sinuses/Orbits: Stable and negative. Other: Mastoids remain clear. Visible internal auditory structures appear normal. Negative visible scalp and face. IMPRESSION: 1. Stable and normal for age noncontrast MRI appearance of the brain. 2. However, bone marrow signal has become abnormal since a 2022 MRI, but is nonspecific. Cannot exclude metastatic disease or a marrow infiltrative  process. But red marrow reactivation is also a possibility, query anemia. Electronically Signed   By: Odessa Fleming M.D.   On: 11/13/2022 05:27   X-ray chest PA and lateral  Result Date: 11/13/2022 CLINICAL DATA:  9926 Syncope and collapse 9926 EXAM: CHEST - 2 VIEW COMPARISON:  Chest x-ray 12/27/2020 FINDINGS: The heart and mediastinal contours are  unchanged. Aortic calcification. No focal consolidation. No pulmonary edema. No pleural effusion. No pneumothorax. No acute osseous abnormality. IMPRESSION: 1. No active cardiopulmonary disease. 2.  Aortic Atherosclerosis (ICD10-I70.0). Electronically Signed   By: Tish Frederickson M.D.   On: 11/13/2022 02:34   CT HEAD WO CONTRAST ( )  Result Date: 11/12/2022 CLINICAL DATA:  Mental status change of unknown cause. Generalized weakness and near syncopal episode. History of bladder cancer. EXAM: CT HEAD WITHOUT CONTRAST TECHNIQUE: Contiguous axial images were obtained from the base of the skull through the vertex without intravenous contrast. RADIATION DOSE REDUCTION: This exam was performed according to the departmental dose-optimization program which includes automated exposure control, adjustment of the mA and/or kV according to patient size and/or use of iterative reconstruction technique. COMPARISON:  MRI brain 06/20/2020.  CT head 09/15/2019 FINDINGS: Brain: No evidence of acute infarction, hemorrhage, hydrocephalus, extra-axial collection or mass lesion/mass effect. Vascular: No hyperdense vessel or unexpected calcification. Skull: Normal. Negative for fracture or focal lesion. Sinuses/Orbits: No acute finding. Other: None. IMPRESSION: No acute intracranial abnormalities. Electronically Signed   By: Burman Nieves M.D.   On: 11/12/2022 19:27    Microbiology: Results for orders placed or performed during the hospital encounter of 11/12/22  SARS Coronavirus 2 by RT PCR (hospital order, performed in [facility] hospital lab) *cepheid single result test* Urine, Clean Catch     Status: None   Collection Time: 11/12/22  6:52 PM   Specimen: Urine, Clean Catch; Nasal Swab  Result Value Ref Range Status   SARS Coronavirus 2 by RT PCR NEGATIVE NEGATIVE Final    Comment: (NOTE) SARS-CoV-2 target nucleic acids are NOT DETECTED.  The SARS-CoV-2 RNA is generally detectable in upper and lower respiratory  specimens during the acute phase of infection. The lowest concentration of SARS-CoV-2 viral copies this assay can detect is 250 copies / mL. A negative result does not preclude SARS-CoV-2 infection and should not be used as the sole basis for treatment or other patient management decisions.  A negative result may occur with improper specimen collection / handling, submission of specimen other than nasopharyngeal swab, presence of viral mutation(s) within the areas targeted by this assay, and inadequate number of viral copies (<250 copies / mL). A negative result must be combined with clinical observations, patient history, and epidemiological information.  Fact Sheet for Patients:   RoadLapTop.co.za  Fact Sheet for Healthcare Providers: http://kim-miller.com/  This test is not yet approved or  cleared by the [place] FDA and has been authorized for detection and/or diagnosis of SARS-CoV-2 by FDA under an Emergency Use Authorization (EUA).  This EUA will remain in effect (meaning this test can be used) for the duration of the COVID-19 declaration under Section 564(b)(1) of the Act, 21 U.S.C. section 360bbb-3(b)(1), unless the authorization is terminated or revoked sooner.  Performed at [facility], [address], [place], [place] [address]   Urine Culture     Status: Abnormal   Collection Time: 11/12/22  6:52 PM   Specimen: Urine, Clean Catch  Result Value Ref Range Status   Specimen Description   Final    URINE, CLEAN  CATCH Performed at [facility], [address] [place], [place] [address]    Special Requests   Final    NONE Performed at [facility], [address] [place], [place] [address]    Culture (A)  Final    <10,000 COLONIES/mL INSIGNIFICANT GROWTH Performed at [facility], [address] [place], [place] [address]    Report Status 11/13/2022 FINAL  Final     Labs: CBC: Recent Labs  Lab 11/12/22 1852 11/13/22 0236  WBC 7.8 9.5  HGB 10.9* 10.7*  HCT 32.6* 31.6*  MCV 93.4 93.2  PLT 536* 565*   Basic Metabolic Panel: Recent Labs  Lab 11/12/22 1852 11/12/22 2033 11/13/22 0236 11/16/22 0936  NA SPECIMEN HEMOLYZED. HEMOLYSIS MAY AFFECT INTEGRITY OF RESULTS. 142  --  137  K  --  4.5  --  3.8  CL  --  107  --  100  CO2  --  24  --  26  GLUCOSE  --  90  --  122*  BUN  --  17  --  23  CREATININE  --  1.40* 1.39* 1.42*  CALCIUM  --  9.6  --  10.4*   Liver Function Tests: Recent Labs  Lab 11/12/22 2033  AST 14*  ALT 10  ALKPHOS 67  BILITOT 0.9  PROT 6.7  ALBUMIN 4.1   CBG: Recent Labs  Lab 11/14/22 0657 11/15/22 0537 11/16/22 0510 11/16/22 0829  GLUCAP 94 106* 104* 118*    Discharge time spent: greater than 30 minutes.  Signed: Lucile Shutters, MD [facility] 11/17/2022

## 2022-11-16 DIAGNOSIS — I674 Hypertensive encephalopathy: Secondary | ICD-10-CM | POA: Diagnosis not present

## 2022-11-16 LAB — BASIC METABOLIC PANEL
Anion gap: 11 (ref 5–15)
BUN: 23 mg/dL (ref 8–23)
CO2: 26 mmol/L (ref 22–32)
Calcium: 10.4 mg/dL — ABNORMAL HIGH (ref 8.9–10.3)
Chloride: 100 mmol/L (ref 98–111)
Creatinine, Ser: 1.42 mg/dL — ABNORMAL HIGH (ref 0.44–1.00)
GFR, Estimated: 38 mL/min — ABNORMAL LOW (ref >60)
Glucose, Bld: 122 mg/dL — ABNORMAL HIGH (ref 70–99)
Potassium: 3.8 mmol/L (ref 3.5–5.1)
Sodium: 137 mmol/L (ref 135–145)

## 2022-11-16 LAB — GLUCOSE, CAPILLARY
Glucose-Capillary: 104 mg/dL — ABNORMAL HIGH (ref 70–99)
Glucose-Capillary: 118 mg/dL — ABNORMAL HIGH (ref 70–99)

## 2022-11-16 MED ORDER — HYDRALAZINE HCL 100 MG PO TABS: 100.0000 mg | ORAL_TABLET | Freq: Three times a day (TID) | ORAL | 0 refills | Status: DC

## 2022-11-16 MED ORDER — LISINOPRIL 10 MG PO TABS
10.0000 mg | ORAL_TABLET | Freq: Every day | ORAL | Status: DC
  Administered 2022-11-16 – 2022-11-17 (×2): 10 mg via ORAL
  Filled 2022-11-16 (×2): qty 1

## 2022-11-16 MED ORDER — ALPRAZOLAM 0.5 MG PO TABS: 0.5000 mg | ORAL_TABLET | Freq: Two times a day (BID) | ORAL | Status: DC | PRN

## 2022-11-16 MED ORDER — ALPRAZOLAM 0.5 MG PO TABS
0.5000 mg | ORAL_TABLET | Freq: Two times a day (BID) | ORAL | Status: DC
  Administered 2022-11-16 – 2022-11-17 (×3): 0.5 mg via ORAL
  Filled 2022-11-16 (×3): qty 1

## 2022-11-16 MED ORDER — POLYETHYLENE GLYCOL 3350 17 G PO PACK
17.0000 g | PACK | Freq: Every day | ORAL | Status: DC
  Administered 2022-11-16 – 2022-11-17 (×2): 17 g via ORAL
  Filled 2022-11-16 (×2): qty 1

## 2022-11-16 MED ORDER — HYDRALAZINE HCL 20 MG/ML IJ SOLN
5.0000 mg | Freq: Once | INTRAMUSCULAR | Status: AC
  Administered 2022-11-16: 5 mg via INTRAVENOUS
  Filled 2022-11-16: qty 1

## 2022-11-16 NOTE — Care Management Important Message (Signed)
Important Message  Patient Details  Name: Pamela James MRN: [id number] Date of Birth: 1945-06-17   Medicare Important Message Given:  Yes     [name] 11/16/2022, 12:43 PM

## 2022-11-16 NOTE — Progress Notes (Addendum)
Progress Note   Patient: Pamela James [id number]:[id number] DOB: 03/12/1946 DOA: 11/12/2022     4 DOS: the patient was seen and examined on 11/16/2022   Brief hospital course:   Pamela James is a 77 y.o. 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  History was provided to the ED provider by daughter who states that she had to be helped to get out of bed which is not usual for her.  Patient was unable to describe what she was feeling.  She denied cough or congestion or chest pain, nausea or vomiting or abdominal pain, diarrhea or dysuria. ED course and data reviewed: BP in the ED as high as 235/76, Other vitals WNL. Labs: Unremarkable CBC except for hemoglobin of 10.9, most recent 12.2 a year ago.  CMP unremarkable with renal function at baseline.  COVID-negative.  Troponin 15.  Urinalysis with large hemoglobin but not consistent with infection. EKG, personally viewed and interpreted showing sinus rhythm at 70 with no concerning findings CT head with no acute intracranial process Chest x-ray not done Patient was treated with labetalol IV 10 mg x 2 but remained persistently hypertensive and was subsequently started on a Cardene infusion. Hospitalist consulted for admission.      Assessment and Plan:  * Hypertensive encephalopathy Postural dizziness with presyncope CKD, Stage 3b Patient presenting with generalized weakness and confusion -->improved Appears to be back to her baseline mental status and is awake, alert and oriented to person place and time Blood pressure has improved from admission.   Continue amlodipine and hydralazine which has been increased to 100mg  TID to optimize blood pressure control MRI of the brain is stable and  normal for age.  However, bone marrow signal has become abnormal since a 2022 but is nonspecific. Cannot exclude metastatic disease or a marrowinfiltrative process. But red marrow reactivation is also a possibility, query anemia. 2D Echocardiogram shows an LVEF of 60 - 65%. Discharge was held due to persistently elevated blood pressure.       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 on biopsy from 09/12/19 following initial presentation for gross hematuria and left hydronephrosis and left flank pain--last seen by urology at [facility] in 2023 Patient with microscopic hematuria on UA Keep scheduled follow-up with urology     Anxiety Continue trazodone, citalopram and alprazolam     History of subclavian DVT (deep vein thrombosis) Continue Eliquis    Chronic diastolic CHF (congestive heart failure) (HCC) Clinically euvolemic Not on GDMT  Last EF 60 to 65% 2021     COPD (chronic obstructive pulmonary disease) (HCC) Not acutely exacerbated DuoNebs as needed       Subjective: Patient is seen and examined at the bedside.  Appears comfortable and in no distress.  Physical Exam: Vitals:   11/16/22 1459 11/16/22 1500 11/16/22 1526 11/16/22 1528  BP: (!) 192/56 (!) 172/64 (!) 168/68   Pulse:    78  Resp:    18  Temp:    98.2 F (36.8 C)  TempSrc:      SpO2:    96%  Weight:      Height:       Vitals and nursing note  reviewed.  Constitutional:      General: She is not in acute distress. HENT:     Head: Normocephalic and atraumatic.  Cardiovascular:     Rate and Rhythm: Normal rate and regular rhythm.     Heart sounds: Normal heart sounds.  Pulmonary:     Effort: Pulmonary effort is normal.     Breath sounds: Normal breath sounds.  Abdominal:     Palpations: Abdomen is soft.     Tenderness: There is no abdominal tenderness.  Neurological:     Mental Status: Mental status is at baseline.         Data Reviewed: Labs reviewed  There are no new results to review at this time.  Family Communication: Plan of care discussed with patient's son over the phone.  Disposition: Status is: Inpatient Remains inpatient appropriate because: Optimize blood pressure control  Planned Discharge Destination: Home    Time spent: 35 minutes  Author: Lucile Shutters, MD 11/16/2022 3:36 PM  For on call review [email or web address].

## 2022-11-16 NOTE — TOC Progression Note (Signed)
Transition of Care [facility]) - Progression Note    Patient Details  Name: Pamela James MRN: [id number] Date of Birth: Feb 09, 1946  Transition [facility] Care [facility]) CM/SW Contact  Truddie Hidden, RN Phone Number: 11/16/2022, 11:38 AM  Clinical Narrative:    TOC continuing to follow patent's progress throughout discharge planning.     Expected Discharge Plan: Home/Self Care Barriers to Discharge: Continued Medical Work up  Expected Discharge Plan and Services       Living arrangements for the past 2 months: Single Family Home Expected Discharge Date: 11/15/22                                     Social Determinants of Health (SDOH) Interventions SDOH Screenings   Food Insecurity: No Food Insecurity (11/13/2022)  Housing: Low Risk  (11/13/2022)  Transportation Needs: No Transportation Needs (11/13/2022)  Utilities: Not At Risk (11/13/2022)  Financial Resource Strain: Low Risk  (07/10/2022)   Received from [facility]  Physical Activity: Inactive (07/11/2021)   Received from [facility]  Social Connections: Socially Isolated (07/11/2021)   [facility] from [facility]  Tobacco Use: High Risk (11/13/2022)    Readmission Risk Interventions    11/14/2022   11:20 AM  Readmission Risk Prevention Plan  Transportation Screening Complete  PCP or Specialist Appt within 5-7 Days Complete  Home Care Screening Complete  Medication Review (RN CM) Complete

## 2022-11-16 NOTE — Plan of Care (Signed)

## 2022-11-17 MED ORDER — LISINOPRIL 10 MG PO TABS: 10.0000 mg | ORAL_TABLET | Freq: Every day | ORAL | 0 refills | Status: AC

## 2022-11-17 MED ORDER — AMLODIPINE BESYLATE 5 MG PO TABS
5.0000 mg | ORAL_TABLET | Freq: Two times a day (BID) | ORAL | Status: DC
  Administered 2022-11-17: 5 mg via ORAL
  Filled 2022-11-17: qty 1

## 2022-11-17 MED ORDER — AMLODIPINE BESYLATE 5 MG PO TABS: 5.0000 mg | ORAL_TABLET | Freq: Two times a day (BID) | ORAL | 0 refills | Status: AC

## 2022-11-17 MED ORDER — HYDRALAZINE HCL 50 MG PO TABS
50.0000 mg | ORAL_TABLET | Freq: Three times a day (TID) | ORAL | Status: DC
  Administered 2022-11-17: 50 mg via ORAL
  Filled 2022-11-17: qty 1

## 2022-11-17 MED ORDER — HYDRALAZINE HCL 50 MG PO TABS: 50.0000 mg | ORAL_TABLET | Freq: Two times a day (BID) | ORAL | 0 refills | Status: AC

## 2022-11-17 NOTE — Plan of Care (Signed)

## 2022-11-17 NOTE — Consult Note (Signed)
[facility] Customer service manager [facility]) Accountable Care Organization (ACO) [facility] Liaison Note  11/17/2022  Pamela James 11-30-1945 [id number]  Location: [facility] RN Hospital Liaison screened the patient remotely at [facility].  Insurance: [employer] HMO   Pamela James is a 77 y.o. female who is a Primary Care Patient of [place], Roderic Palau, MD Gavin Potters). The patient was screened for readmission hospitalization with noted medium risk score for unplanned readmission risk with 1 IP in 6 months.  The patient was assessed for potential [facility]) Care Management service needs for post hospital transition for care coordination. Review of patient's electronic medical record reveals patient was admitted for hypertensive encephalopathy.  Collaborated with the [facility] team who indicates pt lives with the son and daughter-in-law and will return to their home where she lives for her ongoing post op care. [facility] will be introduced if accepted by the patient. No other anticipated needs presented at this time for community care management to address.     [facility] Care Management/Population Health does not replace or interfere with any arrangements made by the Inpatient Transition of Care team.   For questions contact:   Elliot Cousin, RN, [facility] Liaison [place]   Population Health Office Hours MTWF  8:00 am-6:00 pm [phone] mobile [phone] [Office toll free line] Office Hours are M-F 8:30 - 5 pm [email or web address]

## 2022-11-17 NOTE — Progress Notes (Signed)
Progress Note   Patient: Pamela James KZS:[id number] DOB: 05-01-1945 DOA: 11/12/2022     5 DOS: the patient was seen and examined on 11/17/2022   Brief hospital course:  CHENEE CORELLA is a 77 y.o. 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  History was provided to the ED provider by daughter who states that she had to be helped to get out of bed which is not usual for her.  Patient was unable to describe what she was feeling.  She denied cough or congestion or chest pain, nausea or vomiting or abdominal pain, diarrhea or dysuria. ED course and data reviewed: BP in the ED as high as 235/76, Other vitals WNL. Labs: Unremarkable CBC except for hemoglobin of 10.9, most recent 12.2 a year ago.  CMP unremarkable with renal function at baseline.  COVID-negative.  Troponin 15.  Urinalysis with large hemoglobin but not consistent with infection. EKG, personally viewed and interpreted showing sinus rhythm at 70 with no concerning findings CT head with no acute intracranial process Chest x-ray not done Patient was treated with labetalol IV 10 mg x 2 but remained persistently hypertensive and was subsequently started on a Cardene infusion. Hospitalist consulted for admission.      Assessment and Plan:  * Hypertensive encephalopathy Postural dizziness with presyncope Patient presenting with generalized weakness and confusion -->improved Appears to be back to her baseline mental status and is awake, alert and oriented to person place and time Discharge was held due to persistently elevated blood pressure Continue amlodipine and dose has been changed from 10 mg daily to 5 mg twice daily and hydralazine 50 mg p.o. twice daily.  Lisinopril 10 mg  daily has been added MRI of the brain is stable and normal for age.  However, bone marrow signal has become abnormal since a 2022 but is nonspecific. Cannot exclude metastatic disease or a marrowinfiltrative process. But red marrow reactivation is also a possibility, query anemia. 2D Echocardiogram shows an LVEF of 60 - 65%. 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 on biopsy from 09/12/19 following initial presentation for gross hematuria and left hydronephrosis and left flank pain--last seen by urology at [facility] in 2023 Patient with microscopic hematuria on UA Keep scheduled follow-up with urology     Anxiety Continue trazodone, citalopram and alprazolam     History of subclavian DVT (deep vein thrombosis) Continue Eliquis    Chronic diastolic CHF (congestive heart failure) (HCC) Clinically euvolemic Not on GDMT  Last EF 60 to 65% 2021     COPD (chronic obstructive pulmonary disease) (HCC) Not acutely exacerbated DuoNebs as needed            Subjective: Patient is seen and examined at the bedside.  Physical Exam: Vitals:   11/17/22 0013 11/17/22 0333 11/17/22 0738 11/17/22 1212  BP: (!) 161/61 (!) 143/56 117/67 (!) 115/54  Pulse: 77 71 76 70  Resp: 17 18 19 17   Temp: 98.3 F (36.8 C) 98.9 F (37.2 C) 98 F (36.7 C) 98 F (36.7 C)  TempSrc: Oral Oral    SpO2: 100% 97%  100% 96%  Weight:      Height:       Vitals and nursing note reviewed.  Constitutional:      General: She is not in acute distress. HENT:     Head: Normocephalic and atraumatic.  Cardiovascular:     Rate and Rhythm: Normal rate and regular rhythm.     Heart sounds: Normal heart sounds.  Pulmonary:     Effort: Pulmonary effort is normal.     Breath sounds: Normal breath sounds.  Abdominal:     Palpations: Abdomen is soft.      Tenderness: There is no abdominal tenderness.  Neurological:     Mental Status: Mental status is at baseline.     Data Reviewed:  There are no new results to review at this time.  Family Communication: Plan of care discussed with patient's son in detail.  He verbalizes understanding and agrees with the plan.  Disposition: Status is: Inpatient Remains inpatient appropriate because: Optimize blood pressure control  Planned Discharge Destination: Home    Time spent: 35 minutes  Author: Lucile Shutters, MD 11/17/2022 1:46 PM  For on call review [email or web address].

## 2022-11-17 NOTE — Plan of Care (Signed)
  Problem: Education: Goal: Knowledge of condition and prescribed therapy will improve 11/17/2022 1543 by Sherre Scarlet, RN Outcome: Adequate for Discharge 11/17/2022 1347 by Sherre Scarlet, RN Outcome: Progressing   Problem: Cardiac: Goal: Will achieve and/or maintain adequate cardiac output 11/17/2022 1543 by Sherre Scarlet, RN Outcome: Adequate for Discharge 11/17/2022 1347 by Sherre Scarlet, RN Outcome: Progressing   Problem: Physical Regulation: Goal: Complications related to the disease process, condition or treatment will be avoided or minimized 11/17/2022 1543 by Sherre Scarlet, RN Outcome: Adequate for Discharge 11/17/2022 1347 by Sherre Scarlet, RN Outcome: Progressing   Problem: Education: Goal: Knowledge of General Education information will improve Description: Including pain rating scale, medication(s)/side effects and non-pharmacologic comfort measures 11/17/2022 1543 by Sherre Scarlet, RN Outcome: Adequate for Discharge 11/17/2022 1347 by Sherre Scarlet, RN Outcome: Progressing   Problem: Health Behavior/Discharge Planning: Goal: Ability to manage health-related needs will improve 11/17/2022 1543 by Sherre Scarlet, RN Outcome: Adequate for Discharge 11/17/2022 1347 by Sherre Scarlet, RN Outcome: Progressing   Problem: Clinical Measurements: Goal: Ability to maintain clinical measurements within normal limits will improve 11/17/2022 1543 by Sherre Scarlet, RN Outcome: Adequate for Discharge 11/17/2022 1347 by Sherre Scarlet, RN Outcome: Progressing Goal: Will remain free from infection 11/17/2022 1543 by Sherre Scarlet, RN Outcome: Adequate for Discharge 11/17/2022 1347 by Sherre Scarlet, RN Outcome: Progressing Goal: Diagnostic test results will improve 11/17/2022 1543 by Sherre Scarlet, RN Outcome: Adequate for Discharge 11/17/2022 1347 by Sherre Scarlet, RN Outcome: Progressing Goal: Respiratory complications will improve 11/17/2022 1543 by Sherre Scarlet,  RN Outcome: Adequate for Discharge 11/17/2022 1347 by Sherre Scarlet, RN Outcome: Progressing Goal: Cardiovascular complication will be avoided 11/17/2022 1543 by Sherre Scarlet, RN Outcome: Adequate for Discharge 11/17/2022 1347 by Sherre Scarlet, RN Outcome: Progressing   Problem: Activity: Goal: Risk for activity intolerance will decrease 11/17/2022 1543 by Sherre Scarlet, RN Outcome: Adequate for Discharge 11/17/2022 1347 by Sherre Scarlet, RN Outcome: Progressing   Problem: Nutrition: Goal: Adequate nutrition will be maintained 11/17/2022 1543 by Sherre Scarlet, RN Outcome: Adequate for Discharge 11/17/2022 1347 by Sherre Scarlet, RN Outcome: Progressing   Problem: Coping: Goal: Level of anxiety will decrease 11/17/2022 1543 by Sherre Scarlet, RN Outcome: Adequate for Discharge 11/17/2022 1347 by Sherre Scarlet, RN Outcome: Progressing   Problem: Elimination: Goal: Will not experience complications related to bowel motility 11/17/2022 1543 by Sherre Scarlet, RN Outcome: Adequate for Discharge 11/17/2022 1347 by Sherre Scarlet, RN Outcome: Progressing Goal: Will not experience complications related to urinary retention 11/17/2022 1543 by Sherre Scarlet, RN Outcome: Adequate for Discharge 11/17/2022 1347 by Sherre Scarlet, RN Outcome: Progressing   Problem: Pain Managment: Goal: General experience of comfort will improve 11/17/2022 1543 by Sherre Scarlet, RN Outcome: Adequate for Discharge 11/17/2022 1347 by Sherre Scarlet, RN Outcome: Progressing   Problem: Safety: Goal: Ability to remain free from injury will improve 11/17/2022 1543 by Sherre Scarlet, RN Outcome: Adequate for Discharge 11/17/2022 1347 by Sherre Scarlet, RN Outcome: Progressing   Problem: Skin Integrity: Goal: Risk for impaired skin integrity will decrease 11/17/2022 1543 by Sherre Scarlet, RN Outcome: Adequate for Discharge 11/17/2022 1347 by Sherre Scarlet, RN Outcome: Progressing

## 2022-11-17 NOTE — TOC Progression Note (Addendum)
Transition of Care [name]) - Progression Note    Patient Details  Name: Pamela James MRN: [id number] Date of Birth: 1945-06-09  Transition [facility]) CM/SW Contact  Truddie Hidden, RN Phone Number: 11/17/2022, 2:39 PM  Clinical Narrative:    Spoke with patient at her bedside. She is agreeable to [facility] and does not have a preference of an agency. She has been advised the accepting [facility] agency will contact her at discharge to scheduled her SOC.   Referral sent and accepted by [facility]     Expected Discharge Plan: Home/Self Care Barriers to Discharge: Continued Medical Work up  Expected Discharge Plan and Services       Living arrangements for the past 2 months: Single Family Home Expected Discharge Date: 11/16/22                                     Social Determinants of Health (SDOH) Interventions SDOH Screenings   Food Insecurity: No Food Insecurity (11/13/2022)  Housing: Low Risk  (11/13/2022)  Transportation Needs: No Transportation Needs (11/13/2022)  Utilities: Not At Risk (11/13/2022)  Financial Resource Strain: Low Risk  (07/10/2022)   Received from [facility]  Physical Activity: Inactive (07/11/2021)   Received from [facility]  Social Connections: Socially Isolated (07/11/2021)   Received from [facility]  Tobacco Use: High Risk (11/13/2022)    Readmission Risk Interventions    11/14/2022   11:20 AM  Readmission Risk Prevention Plan  Transportation Screening Complete  PCP or Specialist Appt within 5-7 Days Complete  Home Care Screening Complete  Medication Review (RN CM) Complete

## 2022-11-17 NOTE — TOC Transition Note (Signed)
Transition of Care [facility]) - CM/SW Discharge Note   Patient Details  Name: Pamela James MRN: [id number] Date of Birth: Nov 20, 1945  Transition of Care [facility]) CM/SW Contact:  Truddie Hidden, RN Phone Number: 11/17/2022, 3:35 PM   Clinical Narrative:    Spoke with patient's son, Debby Bud to advised patient HH was arranged with [facility] . He was advised [facility] will call them to schedule the patient's appointment Patient's daughter in law will pick her up.  TOC signing off.       Barriers to Discharge: Continued Medical Work up   Patient Goals and CMS Choice CMS Medicare.gov Compare Post Acute Care list provided to:: Patient Choice offered to / list presented to : Patient  Discharge Placement                         Discharge Plan and Services Additional resources added to the After Visit Summary for                                       Social Determinants of Health (SDOH) Interventions SDOH Screenings   Food Insecurity: No Food Insecurity (11/13/2022)  Housing: Low Risk  (11/13/2022)  Transportation Needs: No Transportation Needs (11/13/2022)  Utilities: Not At Risk (11/13/2022)  Financial Resource Strain: Low Risk  (07/10/2022)   Received from [facility]  Physical Activity: Inactive (07/11/2021)   Received from [facility]  Social Connections: Socially Isolated (07/11/2021)   Received from [facility]  Tobacco Use: High Risk (11/13/2022)     Readmission Risk Interventions    11/14/2022   11:20 AM  Readmission Risk Prevention Plan  Transportation Screening Complete  PCP or Specialist Appt within 5-7 Days Complete  Home Care Screening Complete  Medication Review (RN CM) Complete

## 2022-11-17 NOTE — Progress Notes (Signed)
Physical Therapy Treatment Patient Details Name: Pamela James MRN: [id number] DOB: 1946-02-20 Today's Date: 11/17/2022   History of Present Illness Pamela James is a 77 y.o. 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  History was provided to the ED provider by daughter who states that she had to be helped to get out of bed which is not usual for her.  Patient was unable to describe what she was feeling.  She denied cough or congestion or chest pain, nausea or vomiting or abdominal pain, diarrhea or dysuria.    PT Comments  OOB and completes x 2 laps on unit with no AD with focus on quick direction changes and general balance challenges.  Pt does have some mild deficits but need no outside assist.  She does state she uses a rollator for community ambulation and no AD at home.  This seems appropriate for pt at this time.  Returned to supine due to general fatigue but overall does well.   If plan is discharge home, recommend the following: Assistance with cooking/housework;Assist for transportation;Help with stairs or ramp for entrance;A little help with bathing/dressing/bathroom   Can travel by private vehicle        Equipment Recommendations  None recommended by PT    Recommendations for Other Services       Precautions / Restrictions Precautions Precautions: Fall Restrictions Weight Bearing Restrictions: No     Mobility  Bed Mobility Overal bed mobility: Modified Independent               Patient Response: Cooperative  Transfers Overall transfer level: Modified independent   Transfers: Sit to/from Stand Sit to Stand: Modified independent (Device/Increase time)                 Ambulation/Gait Ambulation/Gait assistance: Supervision Gait Distance (Feet): 350 Feet Assistive device: None Gait Pattern/deviations: Step-through pattern Gait velocity: WFL     General Gait Details: focused on direction changes, turns and backwards gait   Stairs             Wheelchair Mobility     Tilt Bed Tilt Bed Patient Response: Cooperative  Modified Rankin (Stroke Patients Only)       Balance Overall balance assessment: Mild deficits observed, not formally tested Sitting-balance support: Feet supported Sitting balance-Leahy Scale: Normal     Standing balance support: No upper extremity supported, During functional activity Standing balance-Leahy Scale: Good                              Cognition Arousal: Alert Behavior During Therapy: WFL for tasks assessed/performed Overall Cognitive Status: Within Functional Limits for tasks assessed                                 General Comments: A&Ox4        Exercises      General Comments        Pertinent Vitals/Pain Pain Assessment Pain Assessment: No/denies pain    Home Living  Prior Function            PT Goals (current goals can now be found in the care plan section) Progress towards PT goals: Progressing toward goals    Frequency    Min 1X/week      PT Plan      Co-evaluation              AM-PAC PT "6 Clicks" Mobility   Outcome Measure  Help needed turning from your back to your side while in a flat bed without using bedrails?: None Help needed moving from lying on your back to sitting on the side of a flat bed without using bedrails?: None Help needed moving to and from a bed to a chair (including a wheelchair)?: None Help needed standing up from a chair using your arms (e.g., wheelchair or bedside chair)?: None Help needed to walk in hospital room?: A Little Help needed climbing 3-5 steps with a railing?  : A Little 6 Click Score: 22    End of Session Equipment Utilized During Treatment: Gait belt Activity Tolerance: Patient tolerated treatment well Patient left: in bed;with call bell/phone within reach;with bed alarm set Nurse Communication: Mobility status PT Visit Diagnosis: Unsteadiness on feet (R26.81);Muscle weakness (generalized) (M62.81);Difficulty in walking, not elsewhere classified (R26.2)     Time: 1610-9604 PT Time Calculation (min) (ACUTE ONLY): 8 min  Charges:    $Gait Training: 8-22 mins PT General Charges $$ ACUTE PT VISIT: 1 Visit                   Danielle Dess, PTA 11/17/22, 11:29 AM

## 2022-11-19 DIAGNOSIS — I5032 Chronic diastolic (congestive) heart failure: Secondary | ICD-10-CM | POA: Diagnosis not present

## 2022-11-19 DIAGNOSIS — I674 Hypertensive encephalopathy: Secondary | ICD-10-CM | POA: Diagnosis not present

## 2022-11-19 DIAGNOSIS — I110 Hypertensive heart disease with heart failure: Secondary | ICD-10-CM | POA: Diagnosis not present

## 2022-11-19 DIAGNOSIS — Z7951 Long term (current) use of inhaled steroids: Secondary | ICD-10-CM | POA: Diagnosis not present

## 2022-11-19 DIAGNOSIS — C662 Malignant neoplasm of left ureter: Secondary | ICD-10-CM | POA: Diagnosis not present

## 2022-11-19 DIAGNOSIS — F419 Anxiety disorder, unspecified: Secondary | ICD-10-CM | POA: Diagnosis not present

## 2022-11-19 DIAGNOSIS — Z7901 Long term (current) use of anticoagulants: Secondary | ICD-10-CM | POA: Diagnosis not present

## 2022-11-19 DIAGNOSIS — E785 Hyperlipidemia, unspecified: Secondary | ICD-10-CM | POA: Diagnosis not present

## 2022-11-19 DIAGNOSIS — J4489 Other specified chronic obstructive pulmonary disease: Secondary | ICD-10-CM | POA: Diagnosis not present

## 2022-11-23 DIAGNOSIS — I5032 Chronic diastolic (congestive) heart failure: Secondary | ICD-10-CM | POA: Diagnosis not present

## 2022-11-23 DIAGNOSIS — J4489 Other specified chronic obstructive pulmonary disease: Secondary | ICD-10-CM | POA: Diagnosis not present

## 2022-11-23 DIAGNOSIS — I674 Hypertensive encephalopathy: Secondary | ICD-10-CM | POA: Diagnosis not present

## 2022-11-23 DIAGNOSIS — I110 Hypertensive heart disease with heart failure: Secondary | ICD-10-CM | POA: Diagnosis not present

## 2022-11-23 DIAGNOSIS — E785 Hyperlipidemia, unspecified: Secondary | ICD-10-CM | POA: Diagnosis not present

## 2022-11-23 DIAGNOSIS — F419 Anxiety disorder, unspecified: Secondary | ICD-10-CM | POA: Diagnosis not present

## 2022-11-23 DIAGNOSIS — Z7951 Long term (current) use of inhaled steroids: Secondary | ICD-10-CM | POA: Diagnosis not present

## 2022-11-23 DIAGNOSIS — Z7901 Long term (current) use of anticoagulants: Secondary | ICD-10-CM | POA: Diagnosis not present

## 2022-11-23 DIAGNOSIS — C662 Malignant neoplasm of left ureter: Secondary | ICD-10-CM | POA: Diagnosis not present

## 2022-11-25 DIAGNOSIS — Z09 Encounter for follow-up examination after completed treatment for conditions other than malignant neoplasm: Secondary | ICD-10-CM | POA: Diagnosis not present

## 2022-11-25 DIAGNOSIS — I5032 Chronic diastolic (congestive) heart failure: Secondary | ICD-10-CM | POA: Diagnosis not present

## 2022-11-25 DIAGNOSIS — R4182 Altered mental status, unspecified: Secondary | ICD-10-CM | POA: Diagnosis not present

## 2022-11-25 DIAGNOSIS — J4530 Mild persistent asthma, uncomplicated: Secondary | ICD-10-CM | POA: Diagnosis not present

## 2022-11-25 DIAGNOSIS — J4489 Other specified chronic obstructive pulmonary disease: Secondary | ICD-10-CM | POA: Diagnosis not present

## 2022-11-25 DIAGNOSIS — I110 Hypertensive heart disease with heart failure: Secondary | ICD-10-CM | POA: Diagnosis not present

## 2022-11-25 DIAGNOSIS — E042 Nontoxic multinodular goiter: Secondary | ICD-10-CM | POA: Diagnosis not present

## 2022-12-04 DIAGNOSIS — E785 Hyperlipidemia, unspecified: Secondary | ICD-10-CM | POA: Diagnosis not present

## 2022-12-04 DIAGNOSIS — I110 Hypertensive heart disease with heart failure: Secondary | ICD-10-CM | POA: Diagnosis not present

## 2022-12-04 DIAGNOSIS — I5032 Chronic diastolic (congestive) heart failure: Secondary | ICD-10-CM | POA: Diagnosis not present

## 2022-12-04 DIAGNOSIS — Z7951 Long term (current) use of inhaled steroids: Secondary | ICD-10-CM | POA: Diagnosis not present

## 2022-12-04 DIAGNOSIS — C662 Malignant neoplasm of left ureter: Secondary | ICD-10-CM | POA: Diagnosis not present

## 2022-12-04 DIAGNOSIS — Z7901 Long term (current) use of anticoagulants: Secondary | ICD-10-CM | POA: Diagnosis not present

## 2022-12-04 DIAGNOSIS — I674 Hypertensive encephalopathy: Secondary | ICD-10-CM | POA: Diagnosis not present

## 2022-12-04 DIAGNOSIS — J4489 Other specified chronic obstructive pulmonary disease: Secondary | ICD-10-CM | POA: Diagnosis not present

## 2022-12-04 DIAGNOSIS — F419 Anxiety disorder, unspecified: Secondary | ICD-10-CM | POA: Diagnosis not present

## 2022-12-08 DIAGNOSIS — E785 Hyperlipidemia, unspecified: Secondary | ICD-10-CM | POA: Diagnosis not present

## 2022-12-08 DIAGNOSIS — C662 Malignant neoplasm of left ureter: Secondary | ICD-10-CM | POA: Diagnosis not present

## 2022-12-08 DIAGNOSIS — I110 Hypertensive heart disease with heart failure: Secondary | ICD-10-CM | POA: Diagnosis not present

## 2022-12-08 DIAGNOSIS — I5032 Chronic diastolic (congestive) heart failure: Secondary | ICD-10-CM | POA: Diagnosis not present

## 2022-12-08 DIAGNOSIS — Z7901 Long term (current) use of anticoagulants: Secondary | ICD-10-CM | POA: Diagnosis not present

## 2022-12-08 DIAGNOSIS — I674 Hypertensive encephalopathy: Secondary | ICD-10-CM | POA: Diagnosis not present

## 2022-12-08 DIAGNOSIS — J4489 Other specified chronic obstructive pulmonary disease: Secondary | ICD-10-CM | POA: Diagnosis not present

## 2022-12-08 DIAGNOSIS — F419 Anxiety disorder, unspecified: Secondary | ICD-10-CM | POA: Diagnosis not present

## 2022-12-08 DIAGNOSIS — Z7951 Long term (current) use of inhaled steroids: Secondary | ICD-10-CM | POA: Diagnosis not present

## 2022-12-11 DIAGNOSIS — Z7901 Long term (current) use of anticoagulants: Secondary | ICD-10-CM | POA: Diagnosis not present

## 2022-12-11 DIAGNOSIS — I5032 Chronic diastolic (congestive) heart failure: Secondary | ICD-10-CM | POA: Diagnosis not present

## 2022-12-11 DIAGNOSIS — I674 Hypertensive encephalopathy: Secondary | ICD-10-CM | POA: Diagnosis not present

## 2022-12-11 DIAGNOSIS — J4489 Other specified chronic obstructive pulmonary disease: Secondary | ICD-10-CM | POA: Diagnosis not present

## 2022-12-11 DIAGNOSIS — I110 Hypertensive heart disease with heart failure: Secondary | ICD-10-CM | POA: Diagnosis not present

## 2022-12-11 DIAGNOSIS — C662 Malignant neoplasm of left ureter: Secondary | ICD-10-CM | POA: Diagnosis not present

## 2022-12-11 DIAGNOSIS — Z7951 Long term (current) use of inhaled steroids: Secondary | ICD-10-CM | POA: Diagnosis not present

## 2022-12-15 DIAGNOSIS — J4530 Mild persistent asthma, uncomplicated: Secondary | ICD-10-CM | POA: Diagnosis not present

## 2022-12-15 DIAGNOSIS — I358 Other nonrheumatic aortic valve disorders: Secondary | ICD-10-CM | POA: Diagnosis not present

## 2022-12-15 DIAGNOSIS — J811 Chronic pulmonary edema: Secondary | ICD-10-CM | POA: Diagnosis not present

## 2022-12-15 DIAGNOSIS — Z87891 Personal history of nicotine dependence: Secondary | ICD-10-CM | POA: Diagnosis not present

## 2022-12-15 DIAGNOSIS — F419 Anxiety disorder, unspecified: Secondary | ICD-10-CM | POA: Diagnosis not present

## 2022-12-15 DIAGNOSIS — D472 Monoclonal gammopathy: Secondary | ICD-10-CM | POA: Diagnosis not present

## 2022-12-15 DIAGNOSIS — R001 Bradycardia, unspecified: Secondary | ICD-10-CM | POA: Diagnosis not present

## 2022-12-15 DIAGNOSIS — E7800 Pure hypercholesterolemia, unspecified: Secondary | ICD-10-CM | POA: Diagnosis not present

## 2022-12-15 DIAGNOSIS — I10 Essential (primary) hypertension: Secondary | ICD-10-CM | POA: Diagnosis not present

## 2022-12-17 DIAGNOSIS — Z7901 Long term (current) use of anticoagulants: Secondary | ICD-10-CM | POA: Diagnosis not present

## 2022-12-17 DIAGNOSIS — I674 Hypertensive encephalopathy: Secondary | ICD-10-CM | POA: Diagnosis not present

## 2022-12-17 DIAGNOSIS — J4489 Other specified chronic obstructive pulmonary disease: Secondary | ICD-10-CM | POA: Diagnosis not present

## 2022-12-17 DIAGNOSIS — Z7951 Long term (current) use of inhaled steroids: Secondary | ICD-10-CM | POA: Diagnosis not present

## 2022-12-17 DIAGNOSIS — I110 Hypertensive heart disease with heart failure: Secondary | ICD-10-CM | POA: Diagnosis not present

## 2022-12-17 DIAGNOSIS — I5032 Chronic diastolic (congestive) heart failure: Secondary | ICD-10-CM | POA: Diagnosis not present

## 2022-12-17 DIAGNOSIS — F419 Anxiety disorder, unspecified: Secondary | ICD-10-CM | POA: Diagnosis not present

## 2022-12-17 DIAGNOSIS — C662 Malignant neoplasm of left ureter: Secondary | ICD-10-CM | POA: Diagnosis not present

## 2022-12-17 DIAGNOSIS — E785 Hyperlipidemia, unspecified: Secondary | ICD-10-CM | POA: Diagnosis not present

## 2022-12-19 DIAGNOSIS — I5032 Chronic diastolic (congestive) heart failure: Secondary | ICD-10-CM | POA: Diagnosis not present

## 2022-12-19 DIAGNOSIS — J4489 Other specified chronic obstructive pulmonary disease: Secondary | ICD-10-CM | POA: Diagnosis not present

## 2022-12-19 DIAGNOSIS — C662 Malignant neoplasm of left ureter: Secondary | ICD-10-CM | POA: Diagnosis not present

## 2022-12-19 DIAGNOSIS — E785 Hyperlipidemia, unspecified: Secondary | ICD-10-CM | POA: Diagnosis not present

## 2022-12-19 DIAGNOSIS — Z7901 Long term (current) use of anticoagulants: Secondary | ICD-10-CM | POA: Diagnosis not present

## 2022-12-19 DIAGNOSIS — F419 Anxiety disorder, unspecified: Secondary | ICD-10-CM | POA: Diagnosis not present

## 2022-12-19 DIAGNOSIS — Z7951 Long term (current) use of inhaled steroids: Secondary | ICD-10-CM | POA: Diagnosis not present

## 2022-12-19 DIAGNOSIS — I674 Hypertensive encephalopathy: Secondary | ICD-10-CM | POA: Diagnosis not present

## 2022-12-19 DIAGNOSIS — I110 Hypertensive heart disease with heart failure: Secondary | ICD-10-CM | POA: Diagnosis not present

## 2022-12-22 DIAGNOSIS — I5032 Chronic diastolic (congestive) heart failure: Secondary | ICD-10-CM | POA: Diagnosis not present

## 2022-12-22 DIAGNOSIS — J4489 Other specified chronic obstructive pulmonary disease: Secondary | ICD-10-CM | POA: Diagnosis not present

## 2022-12-22 DIAGNOSIS — I110 Hypertensive heart disease with heart failure: Secondary | ICD-10-CM | POA: Diagnosis not present

## 2022-12-22 DIAGNOSIS — E785 Hyperlipidemia, unspecified: Secondary | ICD-10-CM | POA: Diagnosis not present

## 2022-12-22 DIAGNOSIS — C662 Malignant neoplasm of left ureter: Secondary | ICD-10-CM | POA: Diagnosis not present

## 2022-12-22 DIAGNOSIS — I674 Hypertensive encephalopathy: Secondary | ICD-10-CM | POA: Diagnosis not present

## 2022-12-22 DIAGNOSIS — Z7951 Long term (current) use of inhaled steroids: Secondary | ICD-10-CM | POA: Diagnosis not present

## 2022-12-22 DIAGNOSIS — Z7901 Long term (current) use of anticoagulants: Secondary | ICD-10-CM | POA: Diagnosis not present

## 2022-12-22 DIAGNOSIS — F419 Anxiety disorder, unspecified: Secondary | ICD-10-CM | POA: Diagnosis not present

## 2022-12-23 ENCOUNTER — Inpatient Hospital Stay: Payer: Medicare HMO | Attending: Oncology | Admitting: Oncology

## 2022-12-23 ENCOUNTER — Inpatient Hospital Stay: Payer: Medicare HMO

## 2022-12-23 ENCOUNTER — Encounter: Payer: Self-pay | Admitting: Oncology

## 2022-12-23 VITALS — BP 151/55 | HR 80 | Temp 97.4°F | Resp 16 | Ht 63.0 in | Wt 148.8 lb

## 2022-12-23 DIAGNOSIS — N289 Disorder of kidney and ureter, unspecified: Secondary | ICD-10-CM | POA: Diagnosis not present

## 2022-12-23 DIAGNOSIS — Z86718 Personal history of other venous thrombosis and embolism: Secondary | ICD-10-CM

## 2022-12-23 DIAGNOSIS — F17210 Nicotine dependence, cigarettes, uncomplicated: Secondary | ICD-10-CM | POA: Insufficient documentation

## 2022-12-23 DIAGNOSIS — R779 Abnormality of plasma protein, unspecified: Secondary | ICD-10-CM

## 2022-12-23 DIAGNOSIS — C689 Malignant neoplasm of urinary organ, unspecified: Secondary | ICD-10-CM

## 2022-12-23 DIAGNOSIS — D649 Anemia, unspecified: Secondary | ICD-10-CM | POA: Diagnosis not present

## 2022-12-23 DIAGNOSIS — Z809 Family history of malignant neoplasm, unspecified: Secondary | ICD-10-CM | POA: Diagnosis not present

## 2022-12-23 DIAGNOSIS — D75839 Thrombocytosis, unspecified: Secondary | ICD-10-CM | POA: Diagnosis not present

## 2022-12-23 DIAGNOSIS — C679 Malignant neoplasm of bladder, unspecified: Secondary | ICD-10-CM | POA: Diagnosis not present

## 2022-12-23 LAB — BASIC METABOLIC PANEL - CANCER CENTER ONLY
Anion gap: 6 (ref 5–15)
BUN: 21 mg/dL (ref 8–23)
CO2: 25 mmol/L (ref 22–32)
Calcium: 9.9 mg/dL (ref 8.9–10.3)
Chloride: 103 mmol/L (ref 98–111)
Creatinine: 1.49 mg/dL — ABNORMAL HIGH (ref 0.44–1.00)
GFR, Estimated: 36 mL/min — ABNORMAL LOW
Glucose, Bld: 103 mg/dL — ABNORMAL HIGH (ref 70–99)
Potassium: 4.5 mmol/L (ref 3.5–5.1)
Sodium: 134 mmol/L — ABNORMAL LOW (ref 135–145)

## 2022-12-23 LAB — CBC (CANCER CENTER ONLY)
HCT: 31.1 % — ABNORMAL LOW (ref 36.0–46.0)
Hemoglobin: 10.6 g/dL — ABNORMAL LOW (ref 12.0–15.0)
MCH: 31 pg (ref 26.0–34.0)
MCHC: 34.1 g/dL (ref 30.0–36.0)
MCV: 90.9 fL (ref 80.0–100.0)
Platelet Count: 472 10*3/uL — ABNORMAL HIGH (ref 150–400)
RBC: 3.42 MIL/uL — ABNORMAL LOW (ref 3.87–5.11)
RDW: 17.3 % — ABNORMAL HIGH (ref 11.5–15.5)
WBC Count: 8.9 10*3/uL (ref 4.0–10.5)
nRBC: 0 % (ref 0.0–0.2)

## 2022-12-23 NOTE — Progress Notes (Signed)
[facility]  [phone] [phone]  ID: Pamela James OB: Jun 20, 1945  MR#: [id number]  AOZ#:[id number]  Patient Care Team: Barbette Reichmann, MD as PCP - General (Internal Medicine) Antonieta Iba, MD as PCP - Cardiology (Cardiology) Corsino, Sunday Corn, MD (Specialist) System, Provider Not In  CHIEF COMPLAINT: Urothelial carcinoma, history of left subclavian DVT.  INTERVAL HISTORY: Patient is a 77 year old female who has not been evaluated in over 3 years and is referred back for anticoagulation management of a history of a left subclavian DVT as well as a request to transfer care for a urothelial carcinoma.  She currently feels well and is at her baseline.  She has no neurologic complaints.  She denies any recent fevers or illnesses.  She has good appetite and denies weight loss.  She has no chest pain, shortness of breath, cough, or hemoptysis.  She denies any nausea, vomiting, constipation, or diarrhea.  She has no urinary complaints.  Patient offers no specific complaints today.  REVIEW OF SYSTEMS:   Review of Systems  Constitutional: Negative.  Negative for fever, malaise/fatigue and weight loss.  Respiratory: Negative.  Negative for cough, hemoptysis and shortness of breath.   Cardiovascular: Negative.  Negative for chest pain and leg swelling.  Gastrointestinal: Negative.  Negative for abdominal pain.  Genitourinary: Negative.  Negative for dysuria and hematuria.  Musculoskeletal: Negative.  Negative for back pain.  Skin: Negative.  Negative for rash.  Neurological: Negative.  Negative for dizziness, focal weakness, weakness and headaches.  Psychiatric/Behavioral: Negative.  The patient is not nervous/anxious.     As per HPI. Otherwise, a complete review of systems is negative.  PAST MEDICAL HISTORY: Past Medical History:  Diagnosis Date   Anxiety    Asthma    CHF (congestive heart failure) (HCC)    COPD (chronic obstructive pulmonary  disease) (HCC)    Depression    GERD (gastroesophageal reflux disease)    Hypertension     PAST SURGICAL HISTORY: Past Surgical History:  Procedure Laterality Date   ABDOMINAL HYSTERECTOMY     COLONOSCOPY WITH PROPOFOL N/A 05/26/2021   Procedure: COLONOSCOPY WITH PROPOFOL;  Surgeon: Regis Bill, MD;  Location: [facility] ENDOSCOPY;  Service: Endoscopy;  Laterality: N/A;  PHONE # IS SON'S # (HE IS THE CAREGIVER)   TOTAL HIP ARTHROPLASTY Left    TUBAL LIGATION      FAMILY HISTORY: Family History  Problem Relation Age of Onset   Heart disease Father    Heart disease Sister    Cancer Brother     ADVANCED DIRECTIVES (Y/N):  N  HEALTH MAINTENANCE: Social History   Tobacco Use   Smoking status: Some Days    Current packs/day: 0.75    Average packs/day: 0.8 packs/day for 40.0 years (30.0 ttl pk-yrs)    Types: Cigarettes   Smokeless tobacco: Never  Vaping Use   Vaping status: Never Used  Substance Use Topics   Alcohol use: No   Drug use: No     Colonoscopy:  PAP:  Bone density:  Lipid panel:  Allergies  Allergen Reactions   Morphine And Codeine Other (See Comments)    Delerium, itching   Nsaids Other (See Comments)    GI ulcers   Aspirin Other (See Comments)    GI ulcers Can tolerate 81 mg daily    Current Outpatient Medications  Medication Sig Dispense Refill   albuterol (PROVENTIL HFA) 108 (90 Base) MCG/ACT inhaler Inhale 2 puffs into the lungs every 4 (four) hours  as needed for wheezing or shortness of breath. 1 each 0   ALPRAZolam (XANAX) 0.5 MG tablet Take 0.5 mg by mouth 2 (two) times daily.     amLODipine (NORVASC) 5 MG tablet Take 1 tablet (5 mg total) by mouth 2 (two) times daily. 60 tablet 0   apixaban (ELIQUIS) 5 MG TABS tablet Take 1 tablet (5 mg total) by mouth 2 (two) times daily. 60 tablet 1   cholecalciferol (VITAMIN D3) 25 MCG (1000 UNIT) tablet Take 1,000 Units by mouth daily.     Docusate Sodium (DSS) 100 MG CAPS Take 100 mg by mouth daily.      Fluticasone-Umeclidin-Vilant 100-62.5-25 MCG/ACT AEPB Take 1 puff by mouth daily.     memantine (NAMENDA) 5 MG tablet Take 5 mg by mouth 2 (two) times daily.     omeprazole (PRILOSEC) 20 MG capsule Take 20 mg by mouth daily.     polyethylene glycol (MIRALAX / GLYCOLAX) 17 g packet Take 17 g by mouth daily.     pravastatin (PRAVACHOL) 20 MG tablet Take 20 mg by mouth at bedtime.      tamsulosin (FLOMAX) 0.4 MG CAPS capsule Take 0.4 mg by mouth daily.     traMADol (ULTRAM) 50 MG tablet Take 50 mg by mouth daily as needed for moderate pain.     vitamin B-12 (CYANOCOBALAMIN) 1000 MCG tablet Take 1,000 mcg by mouth daily.     hydrALAZINE (APRESOLINE) 50 MG tablet Take 1 tablet (50 mg total) by mouth 2 (two) times daily. 60 tablet 0   lisinopril (ZESTRIL) 10 MG tablet Take 1 tablet (10 mg total) by mouth daily. 30 tablet 0   No current facility-administered medications for this visit.    OBJECTIVE: Vitals:   12/23/22 1115  BP: (!) 151/55  Pulse: 80  Resp: 16  Temp: (!) 97.4 F (36.3 C)  SpO2: 100%     Body mass index is 26.36 kg/m.    ECOG FS:0 - Asymptomatic  General: Well-developed, well-nourished, no acute distress. Eyes: Pink conjunctiva, anicteric sclera. HEENT: Normocephalic, moist mucous membranes. Lungs: No audible wheezing or coughing. Heart: Regular rate and rhythm. Abdomen: Soft, nontender, no obvious distention. Musculoskeletal: No edema, cyanosis, or clubbing. Neuro: Alert, answering all questions appropriately. Cranial nerves grossly intact. Skin: No rashes or petechiae noted. Psych: Normal affect. Lymphatics: No cervical, calvicular, axillary or inguinal LAD.   LAB RESULTS:  Lab Results  Component Value Date   NA 134 (L) 12/23/2022   K 4.5 12/23/2022   CL 103 12/23/2022   CO2 25 12/23/2022   GLUCOSE 103 (H) 12/23/2022   BUN 21 12/23/2022   CREATININE 1.49 (H) 12/23/2022   CALCIUM 9.9 12/23/2022   PROT 6.7 11/12/2022   ALBUMIN 4.1 11/12/2022   AST 14  (L) 11/12/2022   ALT 10 11/12/2022   ALKPHOS 67 11/12/2022   BILITOT 0.9 11/12/2022   GFRNONAA 36 (L) 12/23/2022   GFRAA 48 (L) 03/25/2020    Lab Results  Component Value Date   WBC 8.9 12/23/2022   NEUTROABS 14.6 (H) 09/17/2019   HGB 10.6 (L) 12/23/2022   HCT 31.1 (L) 12/23/2022   MCV 90.9 12/23/2022   PLT 472 (H) 12/23/2022     STUDIES: No results found.  ASSESSMENT: Urothelial carcinoma, history of left subclavian DVT.  PLAN:    Urothelial carcinoma: Previously, patient received her care at [facility]  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  Subsequent imaging also  revealed multiple bladder masses.  Patient declined surgical intervention and continues to be monitored by active surveillance.  By report, she has not received any treatment for her malignancy.  She wishes to transfer care to [facility] and a referral was given to urology for further evaluation. Left subclavian DVT: Patient was diagnosed with a upper extremity DVT in subclavian, axillary, and brachial veins in July 2021.  Patient was subsequently placed on Eliquis.  Difficult to ascertain etiology of her blood clot, but suspect to a transient risk factor since patient was admitted to the ICU at that time.  She has been instructed to discontinue Eliquis. Elevated protein: Previously MGUS workup was negative, repeat laboratory work is pending at time of dictation. Anemia: Chronic and unchanged.  Patient's hemoglobin is decreased, but stable at 10.6. Thrombocytosis: Chronic and unchanged.  Patient's platelet count is 472 today. Renal insufficiency: Chronic and unchanged.  Patient's creatinine is 1.49.  Continue follow-up with nephrology as needed. Disposition: No intervention needed at this time.  Return to clinic in 4 months with repeat laboratory work and further evaluation.  Patient expressed understanding and was in agreement with this plan. She  also understands that She can call clinic at any time with any questions, concerns, or complaints.    Cancer Staging  No matching staging information was found for the patient.   Jeralyn Ruths, MD   12/23/2022 4:01 PM

## 2022-12-25 LAB — KAPPA/LAMBDA LIGHT CHAINS
Kappa free light chain: 40.6 mg/L — ABNORMAL HIGH (ref 3.3–19.4)
Kappa, lambda light chain ratio: 2.15 — ABNORMAL HIGH (ref 0.26–1.65)
Lambda free light chains: 18.9 mg/L (ref 5.7–26.3)

## 2022-12-25 LAB — IGG, IGA, IGM
IgA: 79 mg/dL (ref 64–422)
IgG (Immunoglobin G), Serum: 770 mg/dL (ref 586–1602)
IgM (Immunoglobulin M), Srm: 141 mg/dL (ref 26–217)

## 2022-12-27 LAB — PROTEIN ELECTROPHORESIS, SERUM
A/G Ratio: 1.6 (ref 0.7–1.7)
Albumin ELP: 3.9 g/dL (ref 2.9–4.4)
Alpha-1-Globulin: 0.3 g/dL (ref 0.0–0.4)
Alpha-2-Globulin: 0.5 g/dL (ref 0.4–1.0)
Beta Globulin: 0.9 g/dL (ref 0.7–1.3)
Gamma Globulin: 0.6 g/dL (ref 0.4–1.8)
Globulin, Total: 2.4 g/dL (ref 2.2–3.9)
Total Protein ELP: 6.3 g/dL (ref 6.0–8.5)

## 2023-01-02 DIAGNOSIS — I110 Hypertensive heart disease with heart failure: Secondary | ICD-10-CM | POA: Diagnosis not present

## 2023-01-02 DIAGNOSIS — J4489 Other specified chronic obstructive pulmonary disease: Secondary | ICD-10-CM | POA: Diagnosis not present

## 2023-01-02 DIAGNOSIS — E785 Hyperlipidemia, unspecified: Secondary | ICD-10-CM | POA: Diagnosis not present

## 2023-01-02 DIAGNOSIS — I5032 Chronic diastolic (congestive) heart failure: Secondary | ICD-10-CM | POA: Diagnosis not present

## 2023-01-02 DIAGNOSIS — Z7951 Long term (current) use of inhaled steroids: Secondary | ICD-10-CM | POA: Diagnosis not present

## 2023-01-02 DIAGNOSIS — F419 Anxiety disorder, unspecified: Secondary | ICD-10-CM | POA: Diagnosis not present

## 2023-01-02 DIAGNOSIS — I674 Hypertensive encephalopathy: Secondary | ICD-10-CM | POA: Diagnosis not present

## 2023-01-02 DIAGNOSIS — C662 Malignant neoplasm of left ureter: Secondary | ICD-10-CM | POA: Diagnosis not present

## 2023-01-02 DIAGNOSIS — Z7901 Long term (current) use of anticoagulants: Secondary | ICD-10-CM | POA: Diagnosis not present

## 2023-01-05 DIAGNOSIS — Z7901 Long term (current) use of anticoagulants: Secondary | ICD-10-CM | POA: Diagnosis not present

## 2023-01-05 DIAGNOSIS — I674 Hypertensive encephalopathy: Secondary | ICD-10-CM | POA: Diagnosis not present

## 2023-01-05 DIAGNOSIS — C662 Malignant neoplasm of left ureter: Secondary | ICD-10-CM | POA: Diagnosis not present

## 2023-01-05 DIAGNOSIS — J4489 Other specified chronic obstructive pulmonary disease: Secondary | ICD-10-CM | POA: Diagnosis not present

## 2023-01-05 DIAGNOSIS — E785 Hyperlipidemia, unspecified: Secondary | ICD-10-CM | POA: Diagnosis not present

## 2023-01-05 DIAGNOSIS — F419 Anxiety disorder, unspecified: Secondary | ICD-10-CM | POA: Diagnosis not present

## 2023-01-05 DIAGNOSIS — I110 Hypertensive heart disease with heart failure: Secondary | ICD-10-CM | POA: Diagnosis not present

## 2023-01-05 DIAGNOSIS — Z7951 Long term (current) use of inhaled steroids: Secondary | ICD-10-CM | POA: Diagnosis not present

## 2023-01-05 DIAGNOSIS — I5032 Chronic diastolic (congestive) heart failure: Secondary | ICD-10-CM | POA: Diagnosis not present

## 2023-01-12 DIAGNOSIS — I110 Hypertensive heart disease with heart failure: Secondary | ICD-10-CM | POA: Diagnosis not present

## 2023-01-12 DIAGNOSIS — J4489 Other specified chronic obstructive pulmonary disease: Secondary | ICD-10-CM | POA: Diagnosis not present

## 2023-01-12 DIAGNOSIS — I674 Hypertensive encephalopathy: Secondary | ICD-10-CM | POA: Diagnosis not present

## 2023-01-12 DIAGNOSIS — I5032 Chronic diastolic (congestive) heart failure: Secondary | ICD-10-CM | POA: Diagnosis not present

## 2023-01-12 DIAGNOSIS — F419 Anxiety disorder, unspecified: Secondary | ICD-10-CM | POA: Diagnosis not present

## 2023-01-12 DIAGNOSIS — C662 Malignant neoplasm of left ureter: Secondary | ICD-10-CM | POA: Diagnosis not present

## 2023-01-12 DIAGNOSIS — Z7951 Long term (current) use of inhaled steroids: Secondary | ICD-10-CM | POA: Diagnosis not present

## 2023-01-12 DIAGNOSIS — E785 Hyperlipidemia, unspecified: Secondary | ICD-10-CM | POA: Diagnosis not present

## 2023-01-12 DIAGNOSIS — Z7901 Long term (current) use of anticoagulants: Secondary | ICD-10-CM | POA: Diagnosis not present

## 2023-02-01 ENCOUNTER — Ambulatory Visit: Payer: Medicare HMO | Attending: Medical | Admitting: Medical

## 2023-02-01 NOTE — Progress Notes (Deleted)
Cardiology Office Note:    Date:  02/01/2023   ID:  Storm Frisk, DOB [birth year], MRN [id number]  PCP:  Barbette Reichmann, MD  [facility] Cardiologist:  Julien Nordmann, MD  [facility] Electrophysiologist:  None   Referring MD: Barbette Reichmann, MD   Chief Complaint: ***  History of Present Illness:    Pamela James is a 77 y.o. 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   She was admitted 09/15/2019-09/27/19 to [facility] after an episode of syncope with bradycardia that resulted in CPR. Admission complicated by aspiration pneumonia with Unasyn, pulmonary edema requiring IV Lasix, left subclavian clot, hypertensive urgency, ?alcohol withdrawal, metabolic encephalopathy, and bradycardia/hypotension. She was intubated and admitted to ICU .  She was hypotensive and bradycardic, requiring Precedex and dopamine drips.  She was started on Eliquis for left subclavian clot.  She was extubated and transferred out of the ICU; however, due to hypertensive urgency, she required transfer back to the ICU for nicardipine drip.  She received IV lasix for pulmonary vascular congestion with improvement. She was discharged home to live with her son 09/27/2019.   09/16/2019 ultrasound of the upper left extremity showed partially occluded thrombus of left subclavian vein.  She also has occlusive thrombus of the axillary vein that is noncompressible, occlusive thrombus of the cephalic vein, which is also noncompressible, occlusive thrombus of the brachial veins, occlusive thrombus of the radial veins, occlusive thrombus of the ulnar veins.  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  Occlusive of the  axillary paired brachial veins, and nonocclusive at the subclavian vein.  Superficial occlusive thrombophlebitis of the cephalic vein, basilic vein, radial vein, and ulnar vein.   On 11/09/2019, she presented to the emergency department and found to be hypoxic, volume overloaded, and hyperkalemic with ARF and severe left hydronephrosis.  CT showed new moderately severe left hydronephrosis  with mid and distal left ureter showing possible hemorrhage by tumor within the ureter. Noted was small bilateral pleural effusions and improved bibasilar airspace disease and right renal scarring with 3 nonobstructing stones.  At presentation, intermittent bradycardia was noted with atropine administered.  Junctional rhythm at 46 bpm noted and thought due to to electrolyte abnormality. With improved electrolytes (hyponatremia, hyperkalemia), repeat EKG showed improved rate and resolution of junctional rhythm.     Limited echo was performed due to electrical alternans on telemetry and echo as below and showed EF 60 to 65%, mild LVH. PRN lasix was provided for wt gain.    She was seen by the heart failure clinic 11/23/2019.  At that time, she reported minimal DOE with palpitations.  Noted was to possibly change lisinopril to [facility] in the future.  She had recently stopped smoking.    On 12/15/2019, she presented to the emergency department after [facility] reportedly took labs and found potassium significantly elevated at 7.8, and once labs were rechecked per our request on 9/29. Recheck showed K 4.6.  It was noted that it was unclear whether or not the value via home health was a true value or 2/2 pseudohyperkalemia but had self resolved.  The patient was last seen 05/2021 and was stable from a cardiac perspective.    More recently, she has seen [facility].   Today,  Past Medical History:  Diagnosis Date   Anxiety    Asthma    CHF (congestive heart failure) (HCC)    COPD (chronic obstructive pulmonary disease) (HCC)     Depression    GERD (gastroesophageal reflux disease)    Hypertension     Past Surgical History:  Procedure Laterality Date   ABDOMINAL HYSTERECTOMY     COLONOSCOPY WITH PROPOFOL N/A 05/26/2021   Procedure: COLONOSCOPY WITH PROPOFOL;  Surgeon: Regis Bill, MD;  Location: [facility] ENDOSCOPY;  Service: Endoscopy;  Laterality: N/A;  PHONE # IS SON'S # (HE IS THE CAREGIVER)   TOTAL HIP ARTHROPLASTY Left    TUBAL LIGATION      Current Medications: No outpatient medications have been marked as taking for the 02/01/23 encounter (Appointment) with Fransico Michael, Ambera Fedele H, PA-C.     Allergies:   Morphine and codeine, Nsaids, and Aspirin   Social History   Socioeconomic History   Marital status: Widowed    Spouse name: Not on file   Number of children: Not on file   Years of education: Not on file   Highest education level: Not on file  Occupational History   Occupation: retired  Tobacco Use   Smoking status: Some Days    Current packs/day: 0.75    Average packs/day: 0.8 packs/day for 40.0 years (30.0 ttl pk-yrs)    Types: Cigarettes   Smokeless tobacco: Never  Vaping Use   Vaping status: Never Used  Substance and Sexual Activity   Alcohol use: No   Drug use: No   Sexual activity: Not on file  Other Topics Concern   Not on file  Social History Narrative   Not on file   Social Determinants of Health   Financial Resource Strain: Low Risk  (07/10/2022)   Received from [facility]   Overall Financial Resource Strain (CARDIA)    Difficulty of Paying Living Expenses: Not hard at all  Food Insecurity: No Food Insecurity (11/13/2022)   Hunger Vital Sign    Worried About Running Out of Food in the Last Year: Never true    Ran Out of Food in the Last Year: Never true  Transportation Needs: No Transportation Needs (11/13/2022)   PRAPARE - Administrator, Civil Service (Medical): No    Lack of Transportation (Non-Medical): No  Physical Activity: Inactive  (07/11/2021)   Received from [facility]   Exercise Vital Sign    Days of Exercise per Week: 0 days    Minutes of Exercise per Session: 0 min  Stress: Not on file  Social Connections: Socially Isolated (07/11/2021)   Received from [facility]   Social Connection and Isolation Panel [NHANES]    Frequency of Communication with Friends and Family: More than three times a week    Frequency of Social Gatherings with Friends and Family: Once a week    Attends Religious Services: Never    Database administrator or Organizations: No    Attends Banker Meetings: Never    Marital Status: Widowed     Family History: The patient's ***family history includes Cancer in her brother; Heart disease in her father and sister.  ROS:   Please see the history of present illness.    *** All other systems reviewed and are negative.  EKGs/Labs/Other Studies Reviewed:    The following studies were reviewed today: ***  EKG:  EKG is *** ordered today.  The ekg ordered today demonstrates ***  Recent Labs: 11/12/2022: ALT 10 12/23/2022: BUN 21; Creatinine 1.49; Hemoglobin 10.6; Platelet Count 472; Potassium 4.5; Sodium 134  Recent Lipid Panel    Component Value Date/Time   CHOL 139 03/25/2016 0341   TRIG 114 09/18/2019 0913   HDL 46 03/25/2016 0341   CHOLHDL 3.0 03/25/2016 0341   VLDL 17 03/25/2016 0341   LDLCALC 76 03/25/2016 0341   LDLDIRECT 67 12/07/2019 1301     Risk Assessment/Calculations:   {Does this patient have ATRIAL FIBRILLATION?:[id number]}   Physical Exam:    VS:  There were no vitals taken for this visit.    Wt Readings from Last 3 Encounters:  12/23/22 148 lb 12.8 oz (67.5 kg)  11/14/22 151 lb 14.4 oz (68.9 kg)  05/26/21 163 lb (73.9 kg)     GEN: *** Well nourished, well developed in no acute distress HEENT: Normal NECK: No JVD; No carotid bruits LYMPHATICS: No lymphadenopathy CARDIAC: ***RRR, no murmurs, rubs,  gallops RESPIRATORY:  Clear to auscultation without rales, wheezing or rhonchi  ABDOMEN: Soft, non-tender, non-distended MUSCULOSKELETAL:  No edema; No deformity  SKIN: Warm and dry NEUROLOGIC:  Alert and oriented x 3 PSYCHIATRIC:  Normal affect   ASSESSMENT:    No diagnosis found. PLAN:    In order of problems listed above:  ***  Disposition: Follow up {follow up:15908} with ***   Shared Decision Making/Informed Consent   {Are you ordering a CV Procedure (e.g. stress test, cath, DCCV, TEE, etc)?   Press F2        :[id number]}    Signed, Monalisa Bayless David Stall, PA-C  02/01/2023 2:15 PM    [facility]

## 2023-02-02 ENCOUNTER — Encounter: Payer: Self-pay | Admitting: Medical

## 2023-03-16 DIAGNOSIS — T402X5A Adverse effect of other opioids, initial encounter: Secondary | ICD-10-CM | POA: Diagnosis not present

## 2023-03-16 DIAGNOSIS — Z5181 Encounter for therapeutic drug level monitoring: Secondary | ICD-10-CM | POA: Diagnosis not present

## 2023-03-16 DIAGNOSIS — G893 Neoplasm related pain (acute) (chronic): Secondary | ICD-10-CM | POA: Diagnosis not present

## 2023-03-16 DIAGNOSIS — Z515 Encounter for palliative care: Secondary | ICD-10-CM | POA: Diagnosis not present

## 2023-03-16 DIAGNOSIS — M2550 Pain in unspecified joint: Secondary | ICD-10-CM | POA: Diagnosis not present

## 2023-03-16 DIAGNOSIS — K5903 Drug induced constipation: Secondary | ICD-10-CM | POA: Diagnosis not present

## 2023-03-16 DIAGNOSIS — M792 Neuralgia and neuritis, unspecified: Secondary | ICD-10-CM | POA: Diagnosis not present

## 2023-03-16 DIAGNOSIS — F419 Anxiety disorder, unspecified: Secondary | ICD-10-CM | POA: Diagnosis not present

## 2023-04-02 DIAGNOSIS — N179 Acute kidney failure, unspecified: Secondary | ICD-10-CM | POA: Diagnosis not present

## 2023-04-02 DIAGNOSIS — C7919 Secondary malignant neoplasm of other urinary organs: Secondary | ICD-10-CM | POA: Diagnosis not present

## 2023-04-02 DIAGNOSIS — M85872 Other specified disorders of bone density and structure, left ankle and foot: Secondary | ICD-10-CM | POA: Diagnosis not present

## 2023-04-02 DIAGNOSIS — R0600 Dyspnea, unspecified: Secondary | ICD-10-CM | POA: Diagnosis not present

## 2023-04-02 DIAGNOSIS — E43 Unspecified severe protein-calorie malnutrition: Secondary | ICD-10-CM | POA: Diagnosis not present

## 2023-04-02 DIAGNOSIS — N134 Hydroureter: Secondary | ICD-10-CM | POA: Diagnosis not present

## 2023-04-02 DIAGNOSIS — I5032 Chronic diastolic (congestive) heart failure: Secondary | ICD-10-CM | POA: Diagnosis not present

## 2023-04-02 DIAGNOSIS — I509 Heart failure, unspecified: Secondary | ICD-10-CM | POA: Diagnosis not present

## 2023-04-02 DIAGNOSIS — R0989 Other specified symptoms and signs involving the circulatory and respiratory systems: Secondary | ICD-10-CM | POA: Diagnosis not present

## 2023-04-02 DIAGNOSIS — N261 Atrophy of kidney (terminal): Secondary | ICD-10-CM | POA: Diagnosis not present

## 2023-04-02 DIAGNOSIS — I071 Rheumatic tricuspid insufficiency: Secondary | ICD-10-CM | POA: Diagnosis not present

## 2023-04-02 DIAGNOSIS — I998 Other disorder of circulatory system: Secondary | ICD-10-CM | POA: Diagnosis not present

## 2023-04-02 DIAGNOSIS — I70202 Unspecified atherosclerosis of native arteries of extremities, left leg: Secondary | ICD-10-CM | POA: Diagnosis not present

## 2023-04-02 DIAGNOSIS — E278 Other specified disorders of adrenal gland: Secondary | ICD-10-CM | POA: Diagnosis not present

## 2023-04-02 DIAGNOSIS — Z515 Encounter for palliative care: Secondary | ICD-10-CM | POA: Diagnosis not present

## 2023-04-02 DIAGNOSIS — I70209 Unspecified atherosclerosis of native arteries of extremities, unspecified extremity: Secondary | ICD-10-CM | POA: Diagnosis not present

## 2023-04-02 DIAGNOSIS — C679 Malignant neoplasm of bladder, unspecified: Secondary | ICD-10-CM | POA: Diagnosis not present

## 2023-04-02 DIAGNOSIS — Z043 Encounter for examination and observation following other accident: Secondary | ICD-10-CM | POA: Diagnosis not present

## 2023-04-02 DIAGNOSIS — K5900 Constipation, unspecified: Secondary | ICD-10-CM | POA: Diagnosis not present

## 2023-04-02 DIAGNOSIS — I130 Hypertensive heart and chronic kidney disease with heart failure and stage 1 through stage 4 chronic kidney disease, or unspecified chronic kidney disease: Secondary | ICD-10-CM | POA: Diagnosis not present

## 2023-04-02 DIAGNOSIS — R52 Pain, unspecified: Secondary | ICD-10-CM | POA: Diagnosis not present

## 2023-04-02 DIAGNOSIS — R638 Other symptoms and signs concerning food and fluid intake: Secondary | ICD-10-CM | POA: Diagnosis not present

## 2023-04-02 DIAGNOSIS — R109 Unspecified abdominal pain: Secondary | ICD-10-CM | POA: Diagnosis not present

## 2023-04-02 DIAGNOSIS — D6859 Other primary thrombophilia: Secondary | ICD-10-CM | POA: Diagnosis not present

## 2023-04-02 DIAGNOSIS — D539 Nutritional anemia, unspecified: Secondary | ICD-10-CM | POA: Diagnosis not present

## 2023-04-02 DIAGNOSIS — M79672 Pain in left foot: Secondary | ICD-10-CM | POA: Diagnosis not present

## 2023-04-02 DIAGNOSIS — I70201 Unspecified atherosclerosis of native arteries of extremities, right leg: Secondary | ICD-10-CM | POA: Diagnosis not present

## 2023-04-02 DIAGNOSIS — I129 Hypertensive chronic kidney disease with stage 1 through stage 4 chronic kidney disease, or unspecified chronic kidney disease: Secondary | ICD-10-CM | POA: Diagnosis not present

## 2023-04-02 DIAGNOSIS — F0390 Unspecified dementia without behavioral disturbance: Secondary | ICD-10-CM | POA: Diagnosis not present

## 2023-04-02 DIAGNOSIS — N183 Chronic kidney disease, stage 3 unspecified: Secondary | ICD-10-CM | POA: Diagnosis not present

## 2023-04-02 DIAGNOSIS — M25552 Pain in left hip: Secondary | ICD-10-CM | POA: Diagnosis not present

## 2023-04-02 DIAGNOSIS — J449 Chronic obstructive pulmonary disease, unspecified: Secondary | ICD-10-CM | POA: Diagnosis not present

## 2023-04-02 DIAGNOSIS — I70222 Atherosclerosis of native arteries of extremities with rest pain, left leg: Secondary | ICD-10-CM | POA: Diagnosis not present

## 2023-04-02 DIAGNOSIS — N1832 Chronic kidney disease, stage 3b: Secondary | ICD-10-CM | POA: Diagnosis not present

## 2023-04-02 DIAGNOSIS — E875 Hyperkalemia: Secondary | ICD-10-CM | POA: Diagnosis not present

## 2023-04-02 DIAGNOSIS — N1330 Unspecified hydronephrosis: Secondary | ICD-10-CM | POA: Diagnosis not present

## 2023-04-02 DIAGNOSIS — R7881 Bacteremia: Secondary | ICD-10-CM | POA: Diagnosis not present

## 2023-04-02 DIAGNOSIS — I739 Peripheral vascular disease, unspecified: Secondary | ICD-10-CM | POA: Diagnosis not present

## 2023-04-02 DIAGNOSIS — Z79899 Other long term (current) drug therapy: Secondary | ICD-10-CM | POA: Diagnosis not present

## 2023-04-02 DIAGNOSIS — N189 Chronic kidney disease, unspecified: Secondary | ICD-10-CM | POA: Diagnosis not present

## 2023-04-02 DIAGNOSIS — E785 Hyperlipidemia, unspecified: Secondary | ICD-10-CM | POA: Diagnosis not present

## 2023-04-17 DEATH — deceased

## 2023-04-23 ENCOUNTER — Other Ambulatory Visit: Payer: Self-pay | Admitting: *Deleted

## 2023-04-23 DIAGNOSIS — C689 Malignant neoplasm of urinary organ, unspecified: Secondary | ICD-10-CM

## 2023-04-26 ENCOUNTER — Inpatient Hospital Stay: Payer: Medicare HMO | Admitting: Oncology

## 2023-04-26 ENCOUNTER — Inpatient Hospital Stay: Payer: Medicare HMO | Attending: Internal Medicine
